# Patient Record
Sex: Male | Born: 1975
Health system: Southern US, Community
[De-identification: ages and names within clinical notes are randomized; demographics above are authoritative.]

## PROBLEM LIST (undated history)

## (undated) DIAGNOSIS — E669 Obesity, unspecified: Secondary | ICD-10-CM

## (undated) DIAGNOSIS — N179 Acute kidney failure, unspecified: Secondary | ICD-10-CM

## (undated) DIAGNOSIS — F419 Anxiety disorder, unspecified: Secondary | ICD-10-CM

## (undated) DIAGNOSIS — E119 Type 2 diabetes mellitus without complications: Secondary | ICD-10-CM

## (undated) DIAGNOSIS — I10 Essential (primary) hypertension: Secondary | ICD-10-CM

## (undated) DIAGNOSIS — K859 Acute pancreatitis without necrosis or infection, unspecified: Secondary | ICD-10-CM

## (undated) DIAGNOSIS — I82890 Acute embolism and thrombosis of other specified veins: Secondary | ICD-10-CM

## (undated) HISTORY — PX: BACK SURGERY: SHX140

## (undated) HISTORY — DX: Obesity, unspecified: E66.9

## (undated) HISTORY — DX: Essential (primary) hypertension: I10

## (undated) HISTORY — DX: Acute embolism and thrombosis of other specified veins: I82.890

## (undated) HISTORY — DX: Type 2 diabetes mellitus without complications: E11.9

---

## 2015-11-02 DIAGNOSIS — B079 Viral wart, unspecified: Secondary | ICD-10-CM | POA: Diagnosis not present

## 2015-11-03 DIAGNOSIS — F411 Generalized anxiety disorder: Secondary | ICD-10-CM | POA: Diagnosis not present

## 2015-11-10 DIAGNOSIS — F411 Generalized anxiety disorder: Secondary | ICD-10-CM | POA: Diagnosis not present

## 2015-11-16 DIAGNOSIS — B079 Viral wart, unspecified: Secondary | ICD-10-CM | POA: Diagnosis not present

## 2015-11-18 DIAGNOSIS — I10 Essential (primary) hypertension: Secondary | ICD-10-CM | POA: Diagnosis not present

## 2015-11-23 DIAGNOSIS — F411 Generalized anxiety disorder: Secondary | ICD-10-CM | POA: Diagnosis not present

## 2015-12-01 DIAGNOSIS — F411 Generalized anxiety disorder: Secondary | ICD-10-CM | POA: Diagnosis not present

## 2015-12-07 DIAGNOSIS — B070 Plantar wart: Secondary | ICD-10-CM | POA: Diagnosis not present

## 2015-12-15 DIAGNOSIS — F411 Generalized anxiety disorder: Secondary | ICD-10-CM | POA: Diagnosis not present

## 2015-12-21 ENCOUNTER — Emergency Department (HOSPITAL_COMMUNITY): Payer: Federal, State, Local not specified - PPO

## 2015-12-21 ENCOUNTER — Encounter (HOSPITAL_COMMUNITY): Payer: Self-pay | Admitting: *Deleted

## 2015-12-21 ENCOUNTER — Inpatient Hospital Stay (HOSPITAL_COMMUNITY)
Admission: EM | Admit: 2015-12-21 | Discharge: 2016-01-05 | DRG: 871 | Disposition: A | Discharge status: Home Health | Payer: Federal, State, Local not specified - PPO | Attending: Internal Medicine | Admitting: Internal Medicine

## 2015-12-21 DIAGNOSIS — K859 Acute pancreatitis without necrosis or infection, unspecified: Secondary | ICD-10-CM | POA: Insufficient documentation

## 2015-12-21 DIAGNOSIS — R945 Abnormal results of liver function studies: Secondary | ICD-10-CM | POA: Diagnosis not present

## 2015-12-21 DIAGNOSIS — F419 Anxiety disorder, unspecified: Secondary | ICD-10-CM | POA: Diagnosis present

## 2015-12-21 DIAGNOSIS — E872 Acidosis, unspecified: Secondary | ICD-10-CM

## 2015-12-21 DIAGNOSIS — F411 Generalized anxiety disorder: Secondary | ICD-10-CM | POA: Diagnosis not present

## 2015-12-21 DIAGNOSIS — K8591 Acute pancreatitis with uninfected necrosis, unspecified: Secondary | ICD-10-CM | POA: Diagnosis not present

## 2015-12-21 DIAGNOSIS — K8080 Other cholelithiasis without obstruction: Secondary | ICD-10-CM | POA: Diagnosis not present

## 2015-12-21 DIAGNOSIS — R6521 Severe sepsis with septic shock: Secondary | ICD-10-CM | POA: Diagnosis present

## 2015-12-21 DIAGNOSIS — G9340 Encephalopathy, unspecified: Secondary | ICD-10-CM | POA: Diagnosis not present

## 2015-12-21 DIAGNOSIS — K8020 Calculus of gallbladder without cholecystitis without obstruction: Secondary | ICD-10-CM | POA: Diagnosis not present

## 2015-12-21 DIAGNOSIS — Z452 Encounter for adjustment and management of vascular access device: Secondary | ICD-10-CM

## 2015-12-21 DIAGNOSIS — Z9911 Dependence on respirator [ventilator] status: Secondary | ICD-10-CM | POA: Diagnosis not present

## 2015-12-21 DIAGNOSIS — I10 Essential (primary) hypertension: Secondary | ICD-10-CM | POA: Diagnosis not present

## 2015-12-21 DIAGNOSIS — D72829 Elevated white blood cell count, unspecified: Secondary | ICD-10-CM

## 2015-12-21 DIAGNOSIS — R1011 Right upper quadrant pain: Secondary | ICD-10-CM

## 2015-12-21 DIAGNOSIS — K760 Fatty (change of) liver, not elsewhere classified: Secondary | ICD-10-CM | POA: Diagnosis present

## 2015-12-21 DIAGNOSIS — R140 Abdominal distension (gaseous): Secondary | ICD-10-CM

## 2015-12-21 DIAGNOSIS — A419 Sepsis, unspecified organism: Secondary | ICD-10-CM | POA: Diagnosis not present

## 2015-12-21 DIAGNOSIS — Z79899 Other long term (current) drug therapy: Secondary | ICD-10-CM | POA: Diagnosis not present

## 2015-12-21 DIAGNOSIS — R1084 Generalized abdominal pain: Secondary | ICD-10-CM | POA: Diagnosis not present

## 2015-12-21 DIAGNOSIS — N179 Acute kidney failure, unspecified: Secondary | ICD-10-CM | POA: Diagnosis not present

## 2015-12-21 DIAGNOSIS — K851 Biliary acute pancreatitis without necrosis or infection: Secondary | ICD-10-CM | POA: Diagnosis present

## 2015-12-21 DIAGNOSIS — E1165 Type 2 diabetes mellitus with hyperglycemia: Secondary | ICD-10-CM | POA: Diagnosis not present

## 2015-12-21 DIAGNOSIS — R339 Retention of urine, unspecified: Secondary | ICD-10-CM | POA: Diagnosis not present

## 2015-12-21 DIAGNOSIS — K567 Ileus, unspecified: Secondary | ICD-10-CM | POA: Diagnosis not present

## 2015-12-21 DIAGNOSIS — K828 Other specified diseases of gallbladder: Secondary | ICD-10-CM | POA: Diagnosis present

## 2015-12-21 DIAGNOSIS — E876 Hypokalemia: Secondary | ICD-10-CM | POA: Diagnosis not present

## 2015-12-21 DIAGNOSIS — J9600 Acute respiratory failure, unspecified whether with hypoxia or hypercapnia: Secondary | ICD-10-CM | POA: Diagnosis not present

## 2015-12-21 DIAGNOSIS — J9690 Respiratory failure, unspecified, unspecified whether with hypoxia or hypercapnia: Secondary | ICD-10-CM | POA: Diagnosis not present

## 2015-12-21 DIAGNOSIS — R0600 Dyspnea, unspecified: Secondary | ICD-10-CM

## 2015-12-21 DIAGNOSIS — R509 Fever, unspecified: Secondary | ICD-10-CM

## 2015-12-21 DIAGNOSIS — D6489 Other specified anemias: Secondary | ICD-10-CM | POA: Diagnosis not present

## 2015-12-21 DIAGNOSIS — E870 Hyperosmolality and hypernatremia: Secondary | ICD-10-CM | POA: Diagnosis present

## 2015-12-21 DIAGNOSIS — R079 Chest pain, unspecified: Secondary | ICD-10-CM | POA: Diagnosis not present

## 2015-12-21 DIAGNOSIS — I160 Hypertensive urgency: Secondary | ICD-10-CM | POA: Diagnosis not present

## 2015-12-21 DIAGNOSIS — R10816 Epigastric abdominal tenderness: Secondary | ICD-10-CM | POA: Diagnosis not present

## 2015-12-21 DIAGNOSIS — E46 Unspecified protein-calorie malnutrition: Secondary | ICD-10-CM | POA: Diagnosis not present

## 2015-12-21 DIAGNOSIS — Z6841 Body Mass Index (BMI) 40.0 and over, adult: Secondary | ICD-10-CM

## 2015-12-21 DIAGNOSIS — T07 Unspecified multiple injuries: Secondary | ICD-10-CM | POA: Diagnosis not present

## 2015-12-21 DIAGNOSIS — K8689 Other specified diseases of pancreas: Secondary | ICD-10-CM | POA: Diagnosis present

## 2015-12-21 DIAGNOSIS — J90 Pleural effusion, not elsewhere classified: Secondary | ICD-10-CM | POA: Diagnosis not present

## 2015-12-21 DIAGNOSIS — R0602 Shortness of breath: Secondary | ICD-10-CM | POA: Diagnosis not present

## 2015-12-21 DIAGNOSIS — R739 Hyperglycemia, unspecified: Secondary | ICD-10-CM | POA: Diagnosis not present

## 2015-12-21 DIAGNOSIS — E669 Obesity, unspecified: Secondary | ICD-10-CM

## 2015-12-21 DIAGNOSIS — J811 Chronic pulmonary edema: Secondary | ICD-10-CM | POA: Diagnosis not present

## 2015-12-21 DIAGNOSIS — J9601 Acute respiratory failure with hypoxia: Secondary | ICD-10-CM | POA: Insufficient documentation

## 2015-12-21 DIAGNOSIS — Z4682 Encounter for fitting and adjustment of non-vascular catheter: Secondary | ICD-10-CM | POA: Diagnosis not present

## 2015-12-21 DIAGNOSIS — R0789 Other chest pain: Secondary | ICD-10-CM | POA: Diagnosis not present

## 2015-12-21 DIAGNOSIS — R6510 Systemic inflammatory response syndrome (SIRS) of non-infectious origin without acute organ dysfunction: Secondary | ICD-10-CM | POA: Diagnosis not present

## 2015-12-21 DIAGNOSIS — R410 Disorientation, unspecified: Secondary | ICD-10-CM | POA: Diagnosis not present

## 2015-12-21 DIAGNOSIS — Z4659 Encounter for fitting and adjustment of other gastrointestinal appliance and device: Secondary | ICD-10-CM

## 2015-12-21 HISTORY — DX: Anxiety disorder, unspecified: F41.9

## 2015-12-21 HISTORY — DX: Acute pancreatitis without necrosis or infection, unspecified: K85.90

## 2015-12-21 HISTORY — DX: Acute kidney failure, unspecified: N17.9

## 2015-12-21 LAB — COMPREHENSIVE METABOLIC PANEL
ALT: 227 U/L — ABNORMAL HIGH (ref 17–63)
AST: 218 U/L — ABNORMAL HIGH (ref 15–41)
Albumin: 4.3 g/dL (ref 3.5–5.0)
Alkaline Phosphatase: 58 U/L (ref 38–126)
Anion gap: 14 (ref 5–15)
BUN: 18 mg/dL (ref 6–20)
CO2: 20 mmol/L — ABNORMAL LOW (ref 22–32)
Calcium: 5.4 mg/dL — CL (ref 8.9–10.3)
Chloride: 107 mmol/L (ref 101–111)
Creatinine, Ser: 1.6 mg/dL — ABNORMAL HIGH (ref 0.61–1.24)
GFR calc Af Amer: >60 mL/min (ref >60)
GFR calc non Af Amer: 52 mL/min — ABNORMAL LOW (ref >60)
Glucose, Bld: 238 mg/dL — ABNORMAL HIGH (ref 65–99)
Potassium: 3 mmol/L — ABNORMAL LOW (ref 3.5–5.1)
Sodium: 141 mmol/L (ref 135–145)
Total Bilirubin: 1.7 mg/dL — ABNORMAL HIGH (ref 0.3–1.2)
Total Protein: 6.5 g/dL (ref 6.5–8.1)

## 2015-12-21 LAB — I-STAT CHEM 8, ED
BUN: 21 mg/dL — ABNORMAL HIGH (ref 6–20)
Calcium, Ion: 1.05 mmol/L — ABNORMAL LOW (ref 1.12–1.23)
Chloride: 105 mmol/L (ref 101–111)
Creatinine, Ser: 1.4 mg/dL — ABNORMAL HIGH (ref 0.61–1.24)
Glucose, Bld: 235 mg/dL — ABNORMAL HIGH (ref 65–99)
HCT: 52 % (ref 39.0–52.0)
Hemoglobin: 17.7 g/dL — ABNORMAL HIGH (ref 13.0–17.0)
Potassium: 3 mmol/L — ABNORMAL LOW (ref 3.5–5.1)
Sodium: 144 mmol/L (ref 135–145)
TCO2: 22 mmol/L (ref 0–100)

## 2015-12-21 LAB — CBC WITH DIFFERENTIAL/PLATELET
Basophils Absolute: 0 10*3/uL (ref 0.0–0.1)
Basophils Relative: 0 %
Eosinophils Absolute: 0 10*3/uL (ref 0.0–0.7)
Eosinophils Relative: 0 %
HCT: 47.7 % (ref 39.0–52.0)
Hemoglobin: 17.1 g/dL — ABNORMAL HIGH (ref 13.0–17.0)
Lymphocytes Relative: 12 %
Lymphs Abs: 2.6 10*3/uL (ref 0.7–4.0)
MCH: 30.9 pg (ref 26.0–34.0)
MCHC: 35.8 g/dL (ref 30.0–36.0)
MCV: 86.1 fL (ref 78.0–100.0)
Monocytes Absolute: 1.7 10*3/uL — ABNORMAL HIGH (ref 0.1–1.0)
Monocytes Relative: 8 %
Neutro Abs: 17.6 10*3/uL — ABNORMAL HIGH (ref 1.7–7.7)
Neutrophils Relative %: 80 %
Platelets: 354 10*3/uL (ref 150–400)
RBC: 5.54 MIL/uL (ref 4.22–5.81)
RDW: 12.1 % (ref 11.5–15.5)
WBC: 21.9 10*3/uL — ABNORMAL HIGH (ref 4.0–10.5)

## 2015-12-21 LAB — PROTIME-INR
INR: 1.14 (ref 0.00–1.49)
Prothrombin Time: 14.7 seconds (ref 11.6–15.2)

## 2015-12-21 LAB — TROPONIN I: Troponin I: <0.03 ng/mL (ref <0.031)

## 2015-12-21 LAB — ABO/RH: ABO/RH(D): AB POS

## 2015-12-21 LAB — TYPE AND SCREEN
ABO/RH(D): AB POS
Antibody Screen: NEGATIVE

## 2015-12-21 MED ORDER — GI COCKTAIL ~~LOC~~: 30.0000 mL | Freq: Once | ORAL | Status: DC

## 2015-12-21 MED ORDER — HYDROMORPHONE HCL 1 MG/ML IJ SOLN
1.0000 mg | Freq: Once | INTRAMUSCULAR | Status: AC
  Administered 2015-12-21: 1 mg via INTRAVENOUS
  Filled 2015-12-21: qty 1

## 2015-12-21 MED ORDER — SODIUM CHLORIDE 0.9 % IV SOLN
1.0000 g | Freq: Once | INTRAVENOUS | Status: AC
  Administered 2015-12-22: 1 g via INTRAVENOUS
  Filled 2015-12-21: qty 10

## 2015-12-21 MED ORDER — IOPAMIDOL (ISOVUE-370) INJECTION 76%
INTRAVENOUS | Status: AC
  Administered 2015-12-21: 100 mL
  Filled 2015-12-21: qty 100

## 2015-12-21 MED ORDER — LABETALOL HCL 5 MG/ML IV SOLN
10.0000 mg | Freq: Once | INTRAVENOUS | Status: AC
  Administered 2015-12-21: 10 mg via INTRAVENOUS
  Filled 2015-12-21: qty 4

## 2015-12-21 NOTE — ED Notes (Signed)
Lab tech to add on lipase

## 2015-12-21 NOTE — ED Provider Notes (Signed)
CSN: [id number]     Arrival date & time 12/21/15  2201 History   First MD Initiated Contact with Patient 12/21/15 2203     Chief Complaint  Patient presents with  . Chest Pain  . Abdominal Pain     (Consider location/radiation/quality/duration/timing/severity/associated sxs/prior Treatment) HPI Comments: Patient with a history of HTN (started lisinopril 3 weeks ago), back pain presents with onset dull, pressure like pain the lower mid-chest around 8:30 pm, that subsequently moved into the abdomen. The pain radiates to the back. He had one episode vomiting at the onset of symptoms and none since. No nausea. He denies SOB but reports the pain is worse with taking a breath. He called his doctor and took Tums x 4 as recommended. When the pain worsened and he started profusely sweating he called for EMS. Per EMS reports, he received 4 NTG and 4 mg Morphine in route and the patient reports no change in symptoms with these medications. No recent fever, cough. No previous history of similar symptoms.   Patient is a 40 y.o. male presenting with chest pain and abdominal pain. The history is provided by the patient and the EMS personnel. No language interpreter was used.  Chest Pain Pain location:  Substernal area Pain quality: dull and pressure   Pain radiates to:  Upper back Pain radiates to the back: yes   Pain severity:  Severe Onset quality:  Gradual Duration:  3 hours Timing:  Constant Progression:  Worsening Associated symptoms: abdominal pain, diaphoresis and vomiting   Associated symptoms: no dizziness, no fever, no headache, no nausea and no shortness of breath   Abdominal Pain Associated symptoms: chest pain and vomiting   Associated symptoms: no chills, no fever, no nausea and no shortness of breath     Past Medical History  Diagnosis Date  . Hypertension    Past Surgical History  Procedure Laterality Date  . Back surgery     No family history on file. Social History   Substance Use Topics  . Smoking status: Never Smoker   . Smokeless tobacco: None  . Alcohol Use: Yes    Review of Systems  Constitutional: Positive for diaphoresis. Negative for fever and chills.  HENT: Negative.   Respiratory: Negative.  Negative for shortness of breath.        See HPI.  Cardiovascular: Positive for chest pain.  Gastrointestinal: Positive for vomiting and abdominal pain. Negative for nausea.  Genitourinary: Negative.   Musculoskeletal: Negative.   Skin: Negative.   Neurological: Negative.  Negative for dizziness, syncope and headaches.      Allergies  Review of patient's allergies indicates no known allergies.  Home Medications   Prior to Admission medications   Not on File   BP 169/99 mmHg Physical Exam  Constitutional: He is oriented to person, place, and time. He appears well-developed and well-nourished.  Diaphoretic, uncomfortable appearing.  HENT:  Head: Normocephalic.  Neck: Normal range of motion. Neck supple.  Cardiovascular: Normal rate, regular rhythm and intact distal pulses.   Pulses are bilaterally equal in UE and LE's.  Pulmonary/Chest: Effort normal and breath sounds normal. He has no wheezes. He has no rales. He exhibits no tenderness.  Abdominal: Soft. Bowel sounds are normal. He exhibits no distension. There is tenderness. There is no rebound (Diffusely tender abdomen, worse in upper quadrants.) and no guarding.  Musculoskeletal: Normal range of motion. He exhibits no edema.  Neurological: He is alert and oriented to person, place, and time.  Skin:  Skin is warm and dry. No rash noted. No pallor.  Psychiatric: He has a normal mood and affect.  Nursing note and vitals reviewed.   ED Course  Procedures (including critical care time) Labs Review Labs Reviewed  TROPONIN I  COMPREHENSIVE METABOLIC PANEL  CBC WITH DIFFERENTIAL/PLATELET  I-STAT CHEM 8, ED   Results for orders placed or performed during the hospital encounter of  12/21/15  Troponin I  Result Value Ref Range   Troponin I <0.03 <0.031 ng/mL  Comprehensive metabolic panel  Result Value Ref Range   Sodium 141 135 - 145 mmol/L   Potassium 3.0 (L) 3.5 - 5.1 mmol/L   Chloride 107 101 - 111 mmol/L   CO2 20 (L) 22 - 32 mmol/L   Glucose, Bld 238 (H) 65 - 99 mg/dL   BUN 18 6 - 20 mg/dL   Creatinine, Ser 6.04 (H) 0.61 - 1.24 mg/dL   Calcium 5.4 (LL) 8.9 - 10.3 mg/dL   Total Protein 6.5 6.5 - 8.1 g/dL   Albumin 4.3 3.5 - 5.0 g/dL   AST 540 (H) 15 - 41 U/L   ALT 227 (H) 17 - 63 U/L   Alkaline Phosphatase 58 38 - 126 U/L   Total Bilirubin 1.7 (H) 0.3 - 1.2 mg/dL   GFR calc non Af Amer 52 (L) >60 mL/min   GFR calc Af Amer >60 >60 mL/min   Anion gap 14 5 - 15  CBC with Differential  Result Value Ref Range   WBC 21.9 (H) 4.0 - 10.5 K/uL   RBC 5.54 4.22 - 5.81 MIL/uL   Hemoglobin 17.1 (H) 13.0 - 17.0 g/dL   HCT 98.1 19.1 - 47.8 %   MCV 86.1 78.0 - 100.0 fL   MCH 30.9 26.0 - 34.0 pg   MCHC 35.8 30.0 - 36.0 g/dL   RDW 29.5 62.1 - 30.8 %   Platelets 354 150 - 400 K/uL   Neutrophils Relative % 80 %   Neutro Abs 17.6 (H) 1.7 - 7.7 K/uL   Lymphocytes Relative 12 %   Lymphs Abs 2.6 0.7 - 4.0 K/uL   Monocytes Relative 8 %   Monocytes Absolute 1.7 (H) 0.1 - 1.0 K/uL   Eosinophils Relative 0 %   Eosinophils Absolute 0.0 0.0 - 0.7 K/uL   Basophils Relative 0 %   Basophils Absolute 0.0 0.0 - 0.1 K/uL  Protime-INR  Result Value Ref Range   Prothrombin Time 14.7 11.6 - 15.2 seconds   INR 1.14 0.00 - 1.49  Lipase, blood  Result Value Ref Range   Lipase 3720 (H) 11 - 51 U/L  I-stat Chem 8, ED  Result Value Ref Range   Sodium 144 135 - 145 mmol/L   Potassium 3.0 (L) 3.5 - 5.1 mmol/L   Chloride 105 101 - 111 mmol/L   BUN 21 (H) 6 - 20 mg/dL   Creatinine, Ser 6.57 (H) 0.61 - 1.24 mg/dL   Glucose, Bld 846 (H) 65 - 99 mg/dL   Calcium, Ion 9.62 (L) 1.12 - 1.23 mmol/L   TCO2 22 0 - 100 mmol/L   Hemoglobin 17.7 (H) 13.0 - 17.0 g/dL   HCT 95.2 84.1 - 32.4 %   Type and screen [facility]  Result Value Ref Range   ABO/RH(D) AB POS    Antibody Screen NEG    Sample Expiration 12/24/2015   ABO/Rh  Result Value Ref Range   ABO/RH(D) AB POS     Imaging Review No results found.  I have personally reviewed and evaluated these images and lab results as part of my medical decision-making.   EKG Interpretation   Date/Time:  Monday Dec 21 2015 22:02:18 EDT Ventricular Rate:  95 PR Interval:  154 QRS Duration: 112 QT Interval:  388 QTC Calculation: 488 R Axis:   84 Text Interpretation:  Sinus rhythm Borderline intraventricular conduction  delay Borderline repolarization abnormality Borderline prolonged QT  interval No previous ECGs available Confirmed by YAO  MD, DAVID ([id number]) on  12/21/2015 10:16:17 PM     CRITICAL CARE Performed by: Elpidio AnisUPSTILL, Vasil Juhasz A   Total critical care time: 25 minutes  Critical care time was exclusive of separately billable procedures and treating other patients.  Critical care was necessary to treat or prevent imminent or life-threatening deterioration.  Critical care was time spent personally by me on the following activities: development of treatment plan with patient and/or surrogate as well as nursing, discussions with consultants, evaluation of patient's response to treatment, examination of patient, obtaining history from patient or surrogate, ordering and performing treatments and interventions, ordering and review of laboratory studies, ordering and review of radiographic studies, pulse oximetry and re-evaluation of patient's condition.  MDM   Final diagnoses:  None   1. Pancreatitis 2. Hypertensive urgency 3. Hypocalcemia    Patient's EKG on arrival shows no ischemia. Dissection considered - CTA per protocol order. Dr. Silverio LayYao in the room to evaluate the patient. VSS. BP in right arm 149/101, left arm 169/99.  11:50 - No dissection seen on CT per Dr. Silverio LayYao - waiting on formal read. Lipase  significantly elevated at 3720, critical hypocalcemia (5.4), moderate leukocytosis (21.9), elevated liver transaminases (ALT 227, AST 218) with total bilirubin 1.7. Discussed the patient with Dr. Silverio LayYao. Ranson's criteria incomplete (unknown LDL) but there is severity concern given the drop in calcium. Plan: waiting for CT results for +/- gall stones to decide on utility of US.  Patient and wife updated on results so far. Blood pressure 218/128. Additional Labetalol ordered as well as pain medication.  1:00 - re-evaluation - abdomen remains tender without worsening tenderness or peritoneal signs. Blood pressure continues to rise. Labetalol, then hydralazine ordered. Will continue to monitor.   1:30 - the CT shows no dissection; pancreatitis confirmed. No gall stones visualized. US pending. Will admit to hospitalist for treatment of pancreatitis.   Elpidio AnisShari Laelynn Blizzard, PA-C 12/22/15 0141  Richardean Canalavid H Yao, MD 12/23/15 [phone]

## 2015-12-21 NOTE — ED Notes (Signed)
Patient transported to CT 

## 2015-12-21 NOTE — ED Notes (Signed)
Pt to ED by EMS c/o chest pressure/epigastric pain onset at 1600. At 2100, pt became diaphoretic with NV. Pt contacted PCP office, told to call EMS. Pt diaphoretic on arrival, Pt reports "hard to breath due to the pain." Pt received nitro x 4, 324 mg asa, 4mg  morphine, and total of tums x 8.

## 2015-12-22 ENCOUNTER — Emergency Department (HOSPITAL_COMMUNITY): Payer: Federal, State, Local not specified - PPO

## 2015-12-22 ENCOUNTER — Encounter (HOSPITAL_COMMUNITY): Payer: Self-pay | Admitting: General Surgery

## 2015-12-22 DIAGNOSIS — Z6841 Body Mass Index (BMI) 40.0 and over, adult: Secondary | ICD-10-CM | POA: Diagnosis not present

## 2015-12-22 DIAGNOSIS — R410 Disorientation, unspecified: Secondary | ICD-10-CM | POA: Diagnosis not present

## 2015-12-22 DIAGNOSIS — I10 Essential (primary) hypertension: Secondary | ICD-10-CM | POA: Diagnosis not present

## 2015-12-22 DIAGNOSIS — J90 Pleural effusion, not elsewhere classified: Secondary | ICD-10-CM | POA: Diagnosis not present

## 2015-12-22 DIAGNOSIS — K851 Biliary acute pancreatitis without necrosis or infection: Secondary | ICD-10-CM | POA: Diagnosis not present

## 2015-12-22 DIAGNOSIS — D72829 Elevated white blood cell count, unspecified: Secondary | ICD-10-CM

## 2015-12-22 DIAGNOSIS — Z452 Encounter for adjustment and management of vascular access device: Secondary | ICD-10-CM | POA: Diagnosis not present

## 2015-12-22 DIAGNOSIS — E872 Acidosis, unspecified: Secondary | ICD-10-CM

## 2015-12-22 DIAGNOSIS — A419 Sepsis, unspecified organism: Secondary | ICD-10-CM | POA: Diagnosis not present

## 2015-12-22 DIAGNOSIS — N179 Acute kidney failure, unspecified: Secondary | ICD-10-CM | POA: Diagnosis not present

## 2015-12-22 DIAGNOSIS — R739 Hyperglycemia, unspecified: Secondary | ICD-10-CM

## 2015-12-22 DIAGNOSIS — F411 Generalized anxiety disorder: Secondary | ICD-10-CM | POA: Diagnosis not present

## 2015-12-22 DIAGNOSIS — J9601 Acute respiratory failure with hypoxia: Secondary | ICD-10-CM | POA: Diagnosis not present

## 2015-12-22 DIAGNOSIS — J811 Chronic pulmonary edema: Secondary | ICD-10-CM | POA: Diagnosis not present

## 2015-12-22 DIAGNOSIS — J9600 Acute respiratory failure, unspecified whether with hypoxia or hypercapnia: Secondary | ICD-10-CM | POA: Diagnosis not present

## 2015-12-22 DIAGNOSIS — R0602 Shortness of breath: Secondary | ICD-10-CM | POA: Diagnosis not present

## 2015-12-22 DIAGNOSIS — I160 Hypertensive urgency: Secondary | ICD-10-CM | POA: Diagnosis present

## 2015-12-22 DIAGNOSIS — K567 Ileus, unspecified: Secondary | ICD-10-CM | POA: Diagnosis not present

## 2015-12-22 DIAGNOSIS — Z4682 Encounter for fitting and adjustment of non-vascular catheter: Secondary | ICD-10-CM | POA: Diagnosis not present

## 2015-12-22 DIAGNOSIS — G9340 Encephalopathy, unspecified: Secondary | ICD-10-CM | POA: Diagnosis not present

## 2015-12-22 DIAGNOSIS — E876 Hypokalemia: Secondary | ICD-10-CM

## 2015-12-22 DIAGNOSIS — E870 Hyperosmolality and hypernatremia: Secondary | ICD-10-CM | POA: Diagnosis not present

## 2015-12-22 DIAGNOSIS — K859 Acute pancreatitis without necrosis or infection, unspecified: Secondary | ICD-10-CM | POA: Diagnosis not present

## 2015-12-22 DIAGNOSIS — Z79899 Other long term (current) drug therapy: Secondary | ICD-10-CM | POA: Diagnosis not present

## 2015-12-22 DIAGNOSIS — K760 Fatty (change of) liver, not elsewhere classified: Secondary | ICD-10-CM | POA: Diagnosis present

## 2015-12-22 DIAGNOSIS — K828 Other specified diseases of gallbladder: Secondary | ICD-10-CM | POA: Diagnosis present

## 2015-12-22 DIAGNOSIS — J9690 Respiratory failure, unspecified, unspecified whether with hypoxia or hypercapnia: Secondary | ICD-10-CM | POA: Diagnosis not present

## 2015-12-22 DIAGNOSIS — R10816 Epigastric abdominal tenderness: Secondary | ICD-10-CM | POA: Diagnosis not present

## 2015-12-22 DIAGNOSIS — R6521 Severe sepsis with septic shock: Secondary | ICD-10-CM | POA: Diagnosis not present

## 2015-12-22 DIAGNOSIS — E46 Unspecified protein-calorie malnutrition: Secondary | ICD-10-CM | POA: Diagnosis not present

## 2015-12-22 DIAGNOSIS — F419 Anxiety disorder, unspecified: Secondary | ICD-10-CM | POA: Diagnosis present

## 2015-12-22 DIAGNOSIS — K8080 Other cholelithiasis without obstruction: Secondary | ICD-10-CM | POA: Diagnosis not present

## 2015-12-22 DIAGNOSIS — D6489 Other specified anemias: Secondary | ICD-10-CM | POA: Diagnosis not present

## 2015-12-22 DIAGNOSIS — R945 Abnormal results of liver function studies: Secondary | ICD-10-CM | POA: Diagnosis not present

## 2015-12-22 DIAGNOSIS — R339 Retention of urine, unspecified: Secondary | ICD-10-CM | POA: Diagnosis not present

## 2015-12-22 DIAGNOSIS — R6510 Systemic inflammatory response syndrome (SIRS) of non-infectious origin without acute organ dysfunction: Secondary | ICD-10-CM | POA: Diagnosis not present

## 2015-12-22 DIAGNOSIS — Z9911 Dependence on respirator [ventilator] status: Secondary | ICD-10-CM | POA: Diagnosis not present

## 2015-12-22 DIAGNOSIS — E669 Obesity, unspecified: Secondary | ICD-10-CM

## 2015-12-22 DIAGNOSIS — E1165 Type 2 diabetes mellitus with hyperglycemia: Secondary | ICD-10-CM | POA: Diagnosis present

## 2015-12-22 DIAGNOSIS — K8689 Other specified diseases of pancreas: Secondary | ICD-10-CM | POA: Diagnosis present

## 2015-12-22 DIAGNOSIS — K8591 Acute pancreatitis with uninfected necrosis, unspecified: Secondary | ICD-10-CM | POA: Diagnosis not present

## 2015-12-22 DIAGNOSIS — R1084 Generalized abdominal pain: Secondary | ICD-10-CM | POA: Diagnosis not present

## 2015-12-22 DIAGNOSIS — K8020 Calculus of gallbladder without cholecystitis without obstruction: Secondary | ICD-10-CM | POA: Diagnosis not present

## 2015-12-22 HISTORY — DX: Acute kidney failure, unspecified: N17.9

## 2015-12-22 HISTORY — DX: Acute pancreatitis without necrosis or infection, unspecified: K85.90

## 2015-12-22 LAB — BASIC METABOLIC PANEL
Anion gap: 11 (ref 5–15)
BUN: 38 mg/dL — ABNORMAL HIGH (ref 6–20)
CO2: 16 mmol/L — ABNORMAL LOW (ref 22–32)
Calcium: 7.7 mg/dL — ABNORMAL LOW (ref 8.9–10.3)
Chloride: 110 mmol/L (ref 101–111)
Creatinine, Ser: 2.26 mg/dL — ABNORMAL HIGH (ref 0.61–1.24)
GFR calc Af Amer: 40 mL/min — ABNORMAL LOW (ref >60)
GFR calc non Af Amer: 34 mL/min — ABNORMAL LOW (ref >60)
Glucose, Bld: 550 mg/dL — ABNORMAL HIGH (ref 65–99)
Potassium: 5.6 mmol/L — ABNORMAL HIGH (ref 3.5–5.1)
Sodium: 137 mmol/L (ref 135–145)

## 2015-12-22 LAB — CBC
HCT: 49.2 % (ref 39.0–52.0)
Hemoglobin: 17.2 g/dL — ABNORMAL HIGH (ref 13.0–17.0)
MCH: 30 pg (ref 26.0–34.0)
MCHC: 35 g/dL (ref 30.0–36.0)
MCV: 85.9 fL (ref 78.0–100.0)
Platelets: 334 10*3/uL (ref 150–400)
RBC: 5.73 MIL/uL (ref 4.22–5.81)
RDW: 12.5 % (ref 11.5–15.5)
WBC: 14.9 10*3/uL — ABNORMAL HIGH (ref 4.0–10.5)

## 2015-12-22 LAB — LACTIC ACID, PLASMA
Lactic Acid, Venous: 3.4 mmol/L (ref 0.5–2.0)
Lactic Acid, Venous: 3.5 mmol/L (ref 0.5–2.0)

## 2015-12-22 LAB — COMPREHENSIVE METABOLIC PANEL
ALT: 322 U/L — ABNORMAL HIGH (ref 17–63)
AST: 183 U/L — ABNORMAL HIGH (ref 15–41)
Albumin: 4 g/dL (ref 3.5–5.0)
Alkaline Phosphatase: 60 U/L (ref 38–126)
Anion gap: 13 (ref 5–15)
BUN: 16 mg/dL (ref 6–20)
CO2: 19 mmol/L — ABNORMAL LOW (ref 22–32)
Calcium: 8.6 mg/dL — ABNORMAL LOW (ref 8.9–10.3)
Chloride: 108 mmol/L (ref 101–111)
Creatinine, Ser: 1.19 mg/dL (ref 0.61–1.24)
GFR calc Af Amer: >60 mL/min (ref >60)
GFR calc non Af Amer: >60 mL/min (ref >60)
Glucose, Bld: 293 mg/dL — ABNORMAL HIGH (ref 65–99)
Potassium: 4.7 mmol/L (ref 3.5–5.1)
Sodium: 140 mmol/L (ref 135–145)
Total Bilirubin: 1.3 mg/dL — ABNORMAL HIGH (ref 0.3–1.2)
Total Protein: 6.8 g/dL (ref 6.5–8.1)

## 2015-12-22 LAB — I-STAT CG4 LACTIC ACID, ED
Lactic Acid, Venous: 2.64 mmol/L (ref 0.5–2.0)
Lactic Acid, Venous: 3.06 mmol/L (ref 0.5–2.0)
Lactic Acid, Venous: 4.55 mmol/L (ref 0.5–2.0)

## 2015-12-22 LAB — GLUCOSE, CAPILLARY
Glucose-Capillary: 532 mg/dL — ABNORMAL HIGH (ref 65–99)
Glucose-Capillary: 562 mg/dL (ref 65–99)

## 2015-12-22 LAB — LIPASE, BLOOD
Lipase: >3000 U/L — ABNORMAL HIGH (ref 11–51)
Lipase: 891 U/L — ABNORMAL HIGH (ref 11–51)

## 2015-12-22 LAB — LIPID PANEL
Cholesterol: 187 mg/dL (ref 0–200)
HDL: 41 mg/dL (ref >40)
LDL Cholesterol: 133 mg/dL — ABNORMAL HIGH (ref 0–99)
Total CHOL/HDL Ratio: 4.6 RATIO
Triglycerides: 63 mg/dL (ref <150)
VLDL: 13 mg/dL (ref 0–40)

## 2015-12-22 LAB — MRSA PCR SCREENING: MRSA by PCR: NEGATIVE

## 2015-12-22 MED ORDER — SODIUM CHLORIDE 0.9 % IV BOLUS (SEPSIS)
1000.0000 mL | Freq: Once | INTRAVENOUS | Status: AC
  Administered 2015-12-22: 1000 mL via INTRAVENOUS

## 2015-12-22 MED ORDER — SODIUM CHLORIDE 0.9 % IV SOLN
INTRAVENOUS | Status: DC
  Administered 2015-12-22: 5 [IU]/h via INTRAVENOUS
  Administered 2015-12-24: 2 [IU]/h via INTRAVENOUS
  Filled 2015-12-22 (×3): qty 2.5

## 2015-12-22 MED ORDER — SODIUM CHLORIDE 0.9 % IV SOLN
INTRAVENOUS | Status: DC
  Administered 2015-12-22 (×2): via INTRAVENOUS

## 2015-12-22 MED ORDER — DEXTROSE-NACL 5-0.45 % IV SOLN
INTRAVENOUS | Status: DC
Start: 2015-12-22 — End: 2015-12-23

## 2015-12-22 MED ORDER — LABETALOL HCL 5 MG/ML IV SOLN
10.0000 mg | Freq: Once | INTRAVENOUS | Status: AC
  Administered 2015-12-22: 10 mg via INTRAVENOUS
  Filled 2015-12-22: qty 4

## 2015-12-22 MED ORDER — HYDRALAZINE HCL 20 MG/ML IJ SOLN
20.0000 mg | INTRAMUSCULAR | Status: DC | PRN
  Administered 2015-12-22 – 2015-12-29 (×11): 20 mg via INTRAVENOUS
  Filled 2015-12-22 (×11): qty 1

## 2015-12-22 MED ORDER — LABETALOL HCL 5 MG/ML IV SOLN
20.0000 mg | INTRAVENOUS | Status: DC
  Administered 2015-12-22 – 2015-12-25 (×11): 20 mg via INTRAVENOUS
  Filled 2015-12-22 (×15): qty 4

## 2015-12-22 MED ORDER — NICARDIPINE HCL IN NACL 20-0.86 MG/200ML-% IV SOLN: 3.0000 mg/h | INTRAVENOUS | Status: DC

## 2015-12-22 MED ORDER — NITROGLYCERIN 2 % TD OINT
1.0000 [in_us] | TOPICAL_OINTMENT | Freq: Four times a day (QID) | TRANSDERMAL | Status: DC
  Administered 2015-12-22 – 2015-12-23 (×4): 1 [in_us] via TOPICAL
  Filled 2015-12-22: qty 1
  Filled 2015-12-22: qty 30
  Filled 2015-12-22: qty 1

## 2015-12-22 MED ORDER — HYDROMORPHONE HCL 1 MG/ML IJ SOLN
1.0000 mg | INTRAMUSCULAR | Status: AC | PRN
  Administered 2015-12-22 (×4): 1 mg via INTRAVENOUS
  Filled 2015-12-22 (×4): qty 1

## 2015-12-22 MED ORDER — ENOXAPARIN SODIUM 40 MG/0.4ML ~~LOC~~ SOLN
40.0000 mg | SUBCUTANEOUS | Status: DC
  Administered 2015-12-22 – 2015-12-23 (×2): 40 mg via SUBCUTANEOUS
  Filled 2015-12-22 (×3): qty 0.4

## 2015-12-22 MED ORDER — ONDANSETRON HCL 4 MG PO TABS: 4.0000 mg | ORAL_TABLET | Freq: Four times a day (QID) | ORAL | Status: DC | PRN

## 2015-12-22 MED ORDER — SODIUM CHLORIDE 0.9 % IV SOLN
INTRAVENOUS | Status: AC
  Administered 2015-12-22: 23:00:00 via INTRAVENOUS

## 2015-12-22 MED ORDER — POTASSIUM CHLORIDE 10 MEQ/100ML IV SOLN
10.0000 meq | INTRAVENOUS | Status: AC
  Administered 2015-12-22 (×2): 10 meq via INTRAVENOUS
  Filled 2015-12-22 (×2): qty 100

## 2015-12-22 MED ORDER — ONDANSETRON HCL 4 MG/2ML IJ SOLN: 4.0000 mg | Freq: Four times a day (QID) | INTRAMUSCULAR | Status: DC | PRN

## 2015-12-22 MED ORDER — SODIUM CHLORIDE 0.9 % IV SOLN
INTRAVENOUS | Status: DC
  Administered 2015-12-22: 04:00:00 via INTRAVENOUS

## 2015-12-22 MED ORDER — HYDRALAZINE HCL 20 MG/ML IJ SOLN
20.0000 mg | Freq: Once | INTRAMUSCULAR | Status: AC
  Administered 2015-12-22: 20 mg via INTRAVENOUS
  Filled 2015-12-22: qty 1

## 2015-12-22 MED ORDER — HYDROMORPHONE HCL 1 MG/ML IJ SOLN
1.0000 mg | INTRAMUSCULAR | Status: DC | PRN
  Administered 2015-12-22 – 2015-12-24 (×9): 1 mg via INTRAVENOUS
  Filled 2015-12-22 (×9): qty 1

## 2015-12-22 MED ORDER — SODIUM CHLORIDE 0.9 % IV SOLN
1.0000 g | Freq: Once | INTRAVENOUS | Status: AC
  Administered 2015-12-22: 1 g via INTRAVENOUS
  Filled 2015-12-22: qty 10

## 2015-12-22 MED ORDER — SODIUM CHLORIDE 0.9% FLUSH
3.0000 mL | Freq: Two times a day (BID) | INTRAVENOUS | Status: DC
  Administered 2015-12-22 (×2): 3 mL via INTRAVENOUS

## 2015-12-22 MED ORDER — HYDRALAZINE HCL 20 MG/ML IJ SOLN
10.0000 mg | Freq: Once | INTRAMUSCULAR | Status: AC
  Administered 2015-12-22: 10 mg via INTRAVENOUS
  Filled 2015-12-22: qty 1

## 2015-12-22 MED ORDER — MAGNESIUM SULFATE 2 GM/50ML IV SOLN
2.0000 g | Freq: Once | INTRAVENOUS | Status: AC
  Administered 2015-12-22: 2 g via INTRAVENOUS
  Filled 2015-12-22: qty 50

## 2015-12-22 MED ORDER — HYDROMORPHONE HCL 1 MG/ML IJ SOLN
1.0000 mg | Freq: Once | INTRAMUSCULAR | Status: AC
Start: 2015-12-22 — End: 2015-12-22
  Administered 2015-12-22: 1 mg via INTRAVENOUS
  Filled 2015-12-22: qty 1

## 2015-12-22 MED ORDER — SODIUM CHLORIDE 0.9 % IV SOLN
INTRAVENOUS | Status: DC
  Administered 2015-12-22: 23:00:00 via INTRAVENOUS

## 2015-12-22 NOTE — ED Notes (Signed)
Dr.Carter aware of repeat lab values. Potassium runs to be held at this time

## 2015-12-22 NOTE — Care Management Note (Signed)
Case Management Note  Patient Details  Name: Timothy [name] MRN: [id number] Date of Birth: 03/03/1976  Subjective/Objective:                  40 yo Pt to ED by EMS c/o chest pressure/epigastric pain onset at 1600. At 2100, pt became diaphoretic with NV. /Home with spouse.  Action/Plan: Follow for disposition needs. /Admit to hospital.   Expected Discharge Date:  12/25/15               Expected Discharge Plan:  Home/Self Care  In-House Referral:     Discharge planning Services  CM Consult  Post Acute Care Choice:    Choice offered to:     DME Arranged:    DME Agency:     HH Arranged:    HH Agency:     Status of Service:  In process, will continue to follow  Medicare Important Message Given:    Date Medicare IM Given:    Medicare IM give by:    Date Additional Medicare IM Given:    Additional Medicare Important Message give by:     If discussed at Long Length of Stay Meetings, dates discussed:    Additional Comments:  Oletta CohnWood, Kavish Lafitte, RN 12/22/2015, 10:59 AM

## 2015-12-22 NOTE — ED Notes (Signed)
Admitting MD at bedside.

## 2015-12-22 NOTE — Consult Note (Signed)
Reason for Consult: gallstone pancreatitis Referring Physician: Dr. Cristal Ford   HPI: Timothy [name] is a 40 year old male with a history of hypertension who presented to [place] last night with sudden onset nausea and vomiting followed by abdominal pain.  Location is generalized.  Severe in severity.  Time pattern is constant.  No aggravating or alleviating factors.  No modifying factors.  Denies previous symptoms.  He endorses to drinking 1 beer per night.  Denies previous abdominal surgeries.  He has a CT angio which was negative for dissection, but noted pancreatitis.  Lipase was >3000, this AM dows to 891.  Elevated LFTs with TB 1.7, down to 1.3 now. WBC 21.9, down to 14.9k.  Abdominal US reveals cholelithiasis, CBD is 24m.  We have therefore been asked to evaluate.  At present time, pain is down to a 4-5 from 8-9.  No further nausea or vomiting.   He is NPO and on IVF.   Past Medical History  Diagnosis Date  . Hypertension     Past Surgical History  Procedure Laterality Date  . Back surgery      History reviewed. No pertinent family history.  Social History:  reports that he has never smoked. He does not have any smokeless tobacco history on file. He reports that he drinks alcohol. He reports that he does not use illicit drugs.  Allergies: No Known Allergies  Medications:  Scheduled Meds: . enoxaparin (LOVENOX) injection  40 mg Subcutaneous Q24H  . labetalol  20 mg Intravenous Q4H  . nitroGLYCERIN  1 inch Topical Q6H  . sodium chloride flush  3 mL Intravenous Q12H   Continuous Infusions: . sodium chloride 125 mL/hr at 12/22/15 0513   PRN Meds:.hydrALAZINE, HYDROmorphone (DILAUDID) injection, ondansetron **OR** ondansetron (ZOFRAN) IV   Results for orders placed or performed during the hospital encounter of 12/21/15 (from the past 48 hour(s))  Troponin I     Status: None   Collection Time: 12/21/15 10:15 PM  Result Value Ref Range   Troponin I <0.03 <0.031 ng/mL     Comment:        NO INDICATION OF MYOCARDIAL INJURY.   Comprehensive metabolic panel     Status: Abnormal   Collection Time: 12/21/15 10:15 PM  Result Value Ref Range   Sodium 141 135 - 145 mmol/L   Potassium 3.0 (L) 3.5 - 5.1 mmol/L   Chloride 107 101 - 111 mmol/L   CO2 20 (L) 22 - 32 mmol/L   Glucose, Bld 238 (H) 65 - 99 mg/dL   BUN 18 6 - 20 mg/dL   Creatinine, Ser 1.60 (H) 0.61 - 1.24 mg/dL   Calcium 5.4 (LL) 8.9 - 10.3 mg/dL    Comment: CRITICAL RESULT CALLED TO, READ BACK BY AND VERIFIED WITH: PHELPS,C RN 12/21/2015 2335 JORDANS    Total Protein 6.5 6.5 - 8.1 g/dL   Albumin 4.3 3.5 - 5.0 g/dL   AST 218 (H) 15 - 41 U/L   ALT 227 (H) 17 - 63 U/L   Alkaline Phosphatase 58 38 - 126 U/L   Total Bilirubin 1.7 (H) 0.3 - 1.2 mg/dL   GFR calc non Af Amer 52 (L) >60 mL/min   GFR calc Af Amer >60 >60 mL/min    Comment: (NOTE) The eGFR has been calculated using the CKD EPI equation. This calculation has not been validated in all clinical situations. eGFR's persistently <60 mL/min signify possible Chronic Kidney Disease.    Anion gap 14 5 - 15  CBC with Differential     Status: Abnormal   Collection Time: 12/21/15 10:15 PM  Result Value Ref Range   WBC 21.9 (H) 4.0 - 10.5 K/uL   RBC 5.54 4.22 - 5.81 MIL/uL   Hemoglobin 17.1 (H) 13.0 - 17.0 g/dL   HCT 47.7 39.0 - 52.0 %   MCV 86.1 78.0 - 100.0 fL   MCH 30.9 26.0 - 34.0 pg   MCHC 35.8 30.0 - 36.0 g/dL   RDW 12.1 11.5 - 15.5 %   Platelets 354 150 - 400 K/uL   Neutrophils Relative % 80 %   Neutro Abs 17.6 (H) 1.7 - 7.7 K/uL   Lymphocytes Relative 12 %   Lymphs Abs 2.6 0.7 - 4.0 K/uL   Monocytes Relative 8 %   Monocytes Absolute 1.7 (H) 0.1 - 1.0 K/uL   Eosinophils Relative 0 %   Eosinophils Absolute 0.0 0.0 - 0.7 K/uL   Basophils Relative 0 %   Basophils Absolute 0.0 0.0 - 0.1 K/uL  Protime-INR     Status: None   Collection Time: 12/21/15 10:15 PM  Result Value Ref Range   Prothrombin Time 14.7 11.6 - 15.2 seconds   INR  1.14 0.00 - 1.49  Lipase, blood     Status: Abnormal   Collection Time: 12/21/15 10:15 PM  Result Value Ref Range   Lipase >3000 (H) 11 - 51 U/L    Comment: CORRECTED RESULTS CALLED TO: BRITTANY CHANDLER,RN AT 4235 12/22/15 BY ZBEECH. CORRECTED ON 05/23 AT 0956: PREVIOUSLY REPORTED AS 3720 RESULTS CONFIRMED BY MANUAL DILUTION   Type and screen [place]     Status: None   Collection Time: 12/21/15 10:24 PM  Result Value Ref Range   ABO/RH(D) AB POS    Antibody Screen NEG    Sample Expiration 12/24/2015   ABO/Rh     Status: None   Collection Time: 12/21/15 10:24 PM  Result Value Ref Range   ABO/RH(D) AB POS   I-stat Chem 8, ED     Status: Abnormal   Collection Time: 12/21/15 10:52 PM  Result Value Ref Range   Sodium 144 135 - 145 mmol/L   Potassium 3.0 (L) 3.5 - 5.1 mmol/L   Chloride 105 101 - 111 mmol/L   BUN 21 (H) 6 - 20 mg/dL   Creatinine, Ser 1.40 (H) 0.61 - 1.24 mg/dL   Glucose, Bld 235 (H) 65 - 99 mg/dL   Calcium, Ion 1.05 (L) 1.12 - 1.23 mmol/L   TCO2 22 0 - 100 mmol/L   Hemoglobin 17.7 (H) 13.0 - 17.0 g/dL   HCT 52.0 39.0 - 52.0 %  Blood culture (routine x 2)     Status: None (Preliminary result)   Collection Time: 12/22/15 12:20 AM  Result Value Ref Range   Specimen Description BLOOD RIGHT ANTECUBITAL    Special Requests BOTTLES DRAWN AEROBIC AND ANAEROBIC 5CC    Culture PENDING    Report Status PENDING   I-Stat CG4 Lactic Acid, ED     Status: Abnormal   Collection Time: 12/22/15 12:22 AM  Result Value Ref Range   Lactic Acid, Venous 3.06 (HH) 0.5 - 2.0 mmol/L   Comment NOTIFIED PHYSICIAN   I-Stat CG4 Lactic Acid, ED     Status: Abnormal   Collection Time: 12/22/15  2:45 AM  Result Value Ref Range   Lactic Acid, Venous 4.55 (HH) 0.5 - 2.0 mmol/L   Comment NOTIFIED PHYSICIAN   CBC     Status: Abnormal  Collection Time: 12/22/15  5:00 AM  Result Value Ref Range   WBC 14.9 (H) 4.0 - 10.5 K/uL   RBC 5.73 4.22 - 5.81 MIL/uL   Hemoglobin  17.2 (H) 13.0 - 17.0 g/dL   HCT 49.2 39.0 - 52.0 %   MCV 85.9 78.0 - 100.0 fL   MCH 30.0 26.0 - 34.0 pg   MCHC 35.0 30.0 - 36.0 g/dL   RDW 12.5 11.5 - 15.5 %   Platelets 334 150 - 400 K/uL  Comprehensive metabolic panel     Status: Abnormal   Collection Time: 12/22/15  5:00 AM  Result Value Ref Range   Sodium 140 135 - 145 mmol/L   Potassium 4.7 3.5 - 5.1 mmol/L    Comment: DELTA CHECK NOTED   Chloride 108 101 - 111 mmol/L   CO2 19 (L) 22 - 32 mmol/L   Glucose, Bld 293 (H) 65 - 99 mg/dL   BUN 16 6 - 20 mg/dL   Creatinine, Ser 1.19 0.61 - 1.24 mg/dL   Calcium 8.6 (L) 8.9 - 10.3 mg/dL    Comment: DELTA CHECK NOTED   Total Protein 6.8 6.5 - 8.1 g/dL   Albumin 4.0 3.5 - 5.0 g/dL   AST 183 (H) 15 - 41 U/L   ALT 322 (H) 17 - 63 U/L   Alkaline Phosphatase 60 38 - 126 U/L   Total Bilirubin 1.3 (H) 0.3 - 1.2 mg/dL   GFR calc non Af Amer >60 >60 mL/min   GFR calc Af Amer >60 >60 mL/min    Comment: (NOTE) The eGFR has been calculated using the CKD EPI equation. This calculation has not been validated in all clinical situations. eGFR's persistently <60 mL/min signify possible Chronic Kidney Disease.    Anion gap 13 5 - 15  Lipid panel     Status: Abnormal   Collection Time: 12/22/15  5:00 AM  Result Value Ref Range   Cholesterol 187 0 - 200 mg/dL   Triglycerides 63 <150 mg/dL   HDL 41 >40 mg/dL   Total CHOL/HDL Ratio 4.6 RATIO   VLDL 13 0 - 40 mg/dL   LDL Cholesterol 133 (H) 0 - 99 mg/dL    Comment:        Total Cholesterol/HDL:CHD Risk Coronary Heart Disease Risk Table                     Men   Women  1/2 Average Risk   3.4   3.3  Average Risk       5.0   4.4  2 X Average Risk   9.6   7.1  3 X Average Risk  23.4   11.0        Use the calculated Patient Ratio above and the CHD Risk Table to determine the patient's CHD Risk.        ATP III CLASSIFICATION (LDL):  <100     mg/dL   Optimal  100-129  mg/dL   Near or Above                    Optimal  130-159  mg/dL    Borderline  160-189  mg/dL   High  >190     mg/dL   Very High   Lipase, blood     Status: Abnormal   Collection Time: 12/22/15  5:00 AM  Result Value Ref Range   Lipase 891 (H) 11 - 51 U/L    Comment: RESULTS CONFIRMED  BY MANUAL DILUTION  I-Stat CG4 Lactic Acid, ED     Status: Abnormal   Collection Time: 12/22/15  5:06 AM  Result Value Ref Range   Lactic Acid, Venous 2.64 (HH) 0.5 - 2.0 mmol/L   Comment NOTIFIED PHYSICIAN     Dg Chest Portable 1 View  12/21/2015  CLINICAL DATA:  Substernal pain epigastric pain beginning tonight. EXAM: PORTABLE CHEST 1 VIEW COMPARISON:  None. FINDINGS: Lungs are adequately inflated without consolidation or effusion. Cardiomediastinal silhouette is within normal. Bones and soft tissues are normal. IMPRESSION: No active disease. Electronically Signed   By: Marin Olp M.D.   On: 12/21/2015 23:49   Ct Angio Chest/abd/pel For Dissection W And/or W/wo  12/22/2015  CLINICAL DATA:  Chest pressure and epigastric pain for 1 day. Nausea and vomiting with diaphoresis. Clinical concern for dissection. EXAM: CT ANGIOGRAPHY CHEST, ABDOMEN AND PELVIS TECHNIQUE: Multidetector CT imaging through the chest, abdomen and pelvis was performed using the standard protocol during bolus administration of intravenous contrast. Multiplanar reconstructed images and MIPs were obtained and reviewed to evaluate the vascular anatomy. CONTRAST:  100 cc Isovue 370 IV COMPARISON:  Chest radiograph earlier this day. FINDINGS: CTA CHEST FINDINGS Normal caliber thoracic aorta without aneurysm, dissection, hematoma or acute aortic abnormality. There is a conventional branching pattern from the aortic arch. No significant atherosclerosis. No filling defects in the central pulmonary arteries to suggest pulmonary embolus. Heart is normal in size. Left hilar calcification, sequela of prior granulomatous disease. No pericardial effusion. No adenopathy. Minimal dependent atelectasis in both lower lobes. 3  mm pleural based nodule in the right lower lobe series 5, image 35 is likely a calcified granuloma. 4 mm left upper lobe granuloma, series 5, image 15. No pleural effusion. Mild distal esophageal thickening. Included thyroid gland is normal. There are no acute or suspicious osseous abnormalities. Mild degenerative disc disease in the thoracic spine. Review of the MIP images confirms the above findings. CTA ABDOMEN AND PELVIS FINDINGS Normal caliber abdominal aorta without dissection or aneurysm. No significant atherosclerosis. Celiac, superior mesenteric, and inferior mesenteric arteries are widely patent. Single bilateral renal arteries are patent, early branching of the left renal artery. Bilateral common and external iliac arteries are normal. Extensive peripancreatic fat stranding extending from the head, through the body and tail. There is adjacent peripancreatic free fluid, no loculated fluid collection. Moderate fluid tracks in the right and left anterior perirenal space into the pericolic gutter. Fluid tracks in the left upper quadrant about the medial spleen. Decreased density of the liver consistent with steatosis. Mild gallbladder distention, no calcified stone. Arterial phase imaging of the spleen and adrenal glands is normal. Symmetric renal enhancement, no hydronephrosis. Stomach distended with ingested contents. Mild bowel wall thickening of small bowel loops in the left upper quadrant, likely reactive secondary to pancreatic inflammation. No small bowel distention. The appendix is normal. Small volume colonic stool. Minimal diverticulosis of sigmoid colon, no diverticulitis. No free air. No evidence of retroperitoneal or mesenteric adenopathy. In the pelvis the bladder is physiologically distended. Prostate gland normal in size. Fat within both inguinal canals. Scattered degenerative change throughout the lumbar spine with degenerative disc disease at L5-S1 and facet arthropathy most prominent at  L3-L4. Review of the MIP images confirms the above findings. IMPRESSION: 1. Normal caliber thoracoabdominal aorta without dissection or acute aortic abnormality. 2. Marked peripancreatic inflammation consistent with pancreatitis. Peripancreatic fluid tracking in the anterior para renal space and both pericolic gutters. 3. Mild gallbladder distention, no calcified stone.  Hepatic steatosis. 4. Sequela of granulomatous disease in the chest. Electronically Signed   By: Jeb Levering M.D.   On: 12/22/2015 01:24   US Abdomen Limited Ruq  12/22/2015  CLINICAL DATA:  Initial evaluation for acute chest pain, epigastric pressure, nausea, vomiting. EXAM: US ABDOMEN LIMITED - RIGHT UPPER QUADRANT COMPARISON:  None. FINDINGS: Gallbladder: Multiple echogenic stones present within the gallbladder lumen, largest of which measured 2.4 cm. Small amount of sludge present as well. Gallbladder wall measured within normal limits at 3 mm. No free pericholecystic fluid. No sonographic Murphy sign elicited on exam. Probable focal fatty sparing noted within the liver adjacent to the gallbladder fossa. Common bile duct: Diameter: 4 mm Liver: Diffusely increased echogenicity, suggestive of steatosis. No focal lesions. IMPRESSION: 1. Cholelithiasis without sonographic evidence for acute cholecystitis or biliary dilatation. 2. Hepatic steatosis. Electronically Signed   By: Jeannine Boga M.D.   On: 12/22/2015 04:04    Review of Systems  Constitutional: Positive for malaise/fatigue and diaphoresis. Negative for fever, chills and weight loss.  Eyes: Negative for blurred vision, double vision, photophobia, pain, discharge and redness.  Respiratory: Negative for cough, hemoptysis, sputum production and shortness of breath.   Cardiovascular: Negative for chest pain, palpitations, orthopnea, claudication, leg swelling and PND.  Gastrointestinal: Positive for nausea, vomiting and abdominal pain. Negative for diarrhea, constipation,  blood in stool and melena.  Genitourinary: Negative for dysuria, urgency, frequency, hematuria and flank pain.  Neurological: Negative for dizziness, tingling, tremors, sensory change, speech change, focal weakness, seizures, loss of consciousness and weakness.  Psychiatric/Behavioral: Negative for depression, suicidal ideas, hallucinations, memory loss and substance abuse. The patient is not nervous/anxious and does not have insomnia.    Blood pressure 141/83, pulse 95, temperature 97.6 F (36.4 C), temperature source Oral, resp. rate 35, SpO2 94 %. Physical Exam  Constitutional: He is oriented to person, place, and time. He appears well-developed and well-nourished. No distress.  Cardiovascular: Normal rate, regular rhythm, normal heart sounds and intact distal pulses.  Exam reveals no gallop and no friction rub.   No murmur heard. Respiratory: Effort normal and breath sounds normal. No respiratory distress. He has no wheezes. He has no rales. He exhibits no tenderness.  GI: Soft. Bowel sounds are normal. He exhibits no mass. There is no rebound and no guarding.  TTP upper abdomen, most appreciated to ruq.   Musculoskeletal: Normal range of motion. He exhibits no edema or tenderness.  Neurological: He is alert and oriented to person, place, and time.  Skin: Skin is warm and dry. No rash noted. He is not diaphoretic. No erythema. No pallor.  Psychiatric: He has a normal mood and affect. His behavior is normal. Judgment and thought content normal.    Assessment/Plan: Gallstone pancreatitis-agree with bowel rest, IV hydration, pain control.  We will proceed with a laparoscopic cholecystectomy once pancreatitis has resolved.  Thank you for the consult.  Will follow along.  FEN-NPO, IVF VTE prophylaxis-SCD/lovenox   Carloyn Lahue ANP-BC  12/22/2015, 11:13 AM

## 2015-12-22 NOTE — H&P (Addendum)
History and Physical    Timothy CoyQuinten [name] ZOX:[id number] DOB: 12-06-1975 DOA: 12/21/2015  PCP: Clayborn HeronVictoria R Rankins, MD  Patient coming from: Home  Chief Complaint: Epigastric pain, nausea, and vomiting  HPI: Timothy CoyQuinten [name] is a 40 y.o. gentleman with a history of HTN, recently started on lisinopril for management, who feels that he was in his baseline state of health until he developed epigastric pain around 5pm, which he attributed to gas.  He arrived home from work, had dinner per routine, and got in bed around 8:30pm.  Shortly after that, he developed nausea and vomiting x2 (nonbloody) followed by increased intensity in his epigastric pain with radiation to his RUQ and into his back.  He had associated diaphoresis.  No chest pain, light-headedness, or near syncope.  He was short of breath secondary to the pain.  No history of prior gallbladder issues.  No history of GERD.  He drinks beer 2-3 times per week, but no history of binge drinking.  ED Course: The patient presented with markedly elevated blood pressure and there was an initial concern for aortic dissection.  Fortunately, CTA of the chest/abdomen/pelvis is negative for dissection, but the patient has marked inflammation of the pancreas, with gallbladder distention.  Lipase markedly elevated to 3700.  RUQ ultrasound pending.  Blood pressure has remained markedly elevated (systolic greater than 200), despite multiple rounds of IV pain medications as well as IV anti-hypertensives.  The patient also has multiple electrolyte abnormalities.  The patient is being admitted to the stepdown unit, primarily for aggressive blood pressure management.  Review of Systems: As per HPI otherwise 10 point review of systems negative.   Past Medical History  Diagnosis Date  . Hypertension     Past Surgical History  Procedure Laterality Date  . Back surgery    Lower back surgery in 2007   reports that he has never smoked. He does not have any smokeless  tobacco history on file. He reports that he drinks alcohol. He reports that he does not use illicit drugs. Drinks a can of beer 2-3 times per week.  He is married.  He has one daughter.  No Known Allergies  FAMILY HISTORY: He denies any known family history of GI disorders.  Prior to Admission medications   Medication Sig Start Date End Date Taking? Authorizing Provider  lisinopril (PRINIVIL,ZESTRIL) 5 MG tablet Take 5 mg by mouth every evening.   Yes Historical Provider, MD    Physical Exam: Filed Vitals:   12/22/15 0125 12/22/15 0130 12/22/15 0213 12/22/15 0215  BP: 220/130 211/124 214/123 223/125  Pulse:  67 70 71  Temp:      TempSrc:      Resp:  29 24 32  SpO2:  95% 100% 99%      Constitutional: NAD, calm but ill appearing  Filed Vitals:   12/22/15 0125 12/22/15 0130 12/22/15 0213 12/22/15 0215  BP: 220/130 211/124 214/123 223/125  Pulse:  67 70 71  Temp:      TempSrc:      Resp:  29 24 32  SpO2:  95% 100% 99%   Eyes: PERRL, lids and conjunctivae normal ENMT: Mucous membranes are slightly dry. Posterior pharynx clear of any exudate or lesions. Normal dentition.  Neck: normal, supple, no masses Respiratory: clear to auscultation bilaterally, no wheezing, no crackles. Normal respiratory effort. No accessory muscle use.  Cardiovascular: Regular rate and rhythm, no murmurs / rubs / gallops. No extremity edema. 2+ pedal pulses. Abdomen: Obese abdomen is diffusely  tender but no significant guarding.  Bowel sounds are hypoactive. Musculoskeletal: no clubbing / cyanosis. No joint deformity upper and lower extremities. Good ROM, no contractures. Normal muscle tone.  Skin: no rashes, lesions, ulcers. No induration Neurologic: CN 2-12 grossly intact. Sensation intact, Strength 5/5 in all 4.  Psychiatric: Normal judgment and insight. Alert and oriented x 3. Normal mood.    Labs on Admission: I have personally reviewed following labs and imaging studies  CBC:  Recent  Labs Lab 12/21/15 2215 12/21/15 2252  WBC 21.9*  --   NEUTROABS 17.6*  --   HGB 17.1* 17.7*  HCT 47.7 52.0  MCV 86.1  --   PLT 354  --    Basic Metabolic Panel:  Recent Labs Lab 12/21/15 2215 12/21/15 2252  NA 141 144  K 3.0* 3.0*  CL 107 105  CO2 20*  --   GLUCOSE 238* 235*  BUN 18 21*  CREATININE 1.60* 1.40*  CALCIUM 5.4*  --    Liver Function Tests:  Recent Labs Lab 12/21/15 2215  AST 218*  ALT 227*  ALKPHOS 58  BILITOT 1.7*  PROT 6.5  ALBUMIN 4.3    Recent Labs Lab 12/21/15 2215  LIPASE 3720*   Coagulation Profile:  Recent Labs Lab 12/21/15 2215  INR 1.14   Cardiac Enzymes:  Recent Labs Lab 12/21/15 2215  TROPONINI <0.03   Sepsis Labs:  Lactic acid 3  Recent Results (from the past 240 hour(s))  Blood culture (routine x 2)     Status: None (Preliminary result)   Collection Time: 12/22/15 12:20 AM  Result Value Ref Range Status   Specimen Description BLOOD RIGHT ANTECUBITAL  Final   Special Requests BOTTLES DRAWN AEROBIC AND ANAEROBIC 5CC  Final   Culture PENDING  Incomplete   Report Status PENDING  Incomplete     Radiological Exams on Admission: Dg Chest Portable 1 View  12/21/2015  CLINICAL DATA:  Substernal pain epigastric pain beginning tonight. EXAM: PORTABLE CHEST 1 VIEW COMPARISON:  None. FINDINGS: Lungs are adequately inflated without consolidation or effusion. Cardiomediastinal silhouette is within normal. Bones and soft tissues are normal. IMPRESSION: No active disease. Electronically Signed   By: Elberta Fortis M.D.   On: 12/21/2015 23:49   Ct Angio Chest/abd/pel For Dissection W And/or W/wo  12/22/2015  CLINICAL DATA:  Chest pressure and epigastric pain for 1 day. Nausea and vomiting with diaphoresis. Clinical concern for dissection. EXAM: CT ANGIOGRAPHY CHEST, ABDOMEN AND PELVIS TECHNIQUE: Multidetector CT imaging through the chest, abdomen and pelvis was performed using the standard protocol during bolus administration of  intravenous contrast. Multiplanar reconstructed images and MIPs were obtained and reviewed to evaluate the vascular anatomy. CONTRAST:  100 cc Isovue 370 IV COMPARISON:  Chest radiograph earlier this day. FINDINGS: CTA CHEST FINDINGS Normal caliber thoracic aorta without aneurysm, dissection, hematoma or acute aortic abnormality. There is a conventional branching pattern from the aortic arch. No significant atherosclerosis. No filling defects in the central pulmonary arteries to suggest pulmonary embolus. Heart is normal in size. Left hilar calcification, sequela of prior granulomatous disease. No pericardial effusion. No adenopathy. Minimal dependent atelectasis in both lower lobes. 3 mm pleural based nodule in the right lower lobe series 5, image 35 is likely a calcified granuloma. 4 mm left upper lobe granuloma, series 5, image 15. No pleural effusion. Mild distal esophageal thickening. Included thyroid gland is normal. There are no acute or suspicious osseous abnormalities. Mild degenerative disc disease in the thoracic spine. Review of the  MIP images confirms the above findings. CTA ABDOMEN AND PELVIS FINDINGS Normal caliber abdominal aorta without dissection or aneurysm. No significant atherosclerosis. Celiac, superior mesenteric, and inferior mesenteric arteries are widely patent. Single bilateral renal arteries are patent, early branching of the left renal artery. Bilateral common and external iliac arteries are normal. Extensive peripancreatic fat stranding extending from the head, through the body and tail. There is adjacent peripancreatic free fluid, no loculated fluid collection. Moderate fluid tracks in the right and left anterior perirenal space into the pericolic gutter. Fluid tracks in the left upper quadrant about the medial spleen. Decreased density of the liver consistent with steatosis. Mild gallbladder distention, no calcified stone. Arterial phase imaging of the spleen and adrenal glands is  normal. Symmetric renal enhancement, no hydronephrosis. Stomach distended with ingested contents. Mild bowel wall thickening of small bowel loops in the left upper quadrant, likely reactive secondary to pancreatic inflammation. No small bowel distention. The appendix is normal. Small volume colonic stool. Minimal diverticulosis of sigmoid colon, no diverticulitis. No free air. No evidence of retroperitoneal or mesenteric adenopathy. In the pelvis the bladder is physiologically distended. Prostate gland normal in size. Fat within both inguinal canals. Scattered degenerative change throughout the lumbar spine with degenerative disc disease at L5-S1 and facet arthropathy most prominent at L3-L4. Review of the MIP images confirms the above findings. IMPRESSION: 1. Normal caliber thoracoabdominal aorta without dissection or acute aortic abnormality. 2. Marked peripancreatic inflammation consistent with pancreatitis. Peripancreatic fluid tracking in the anterior para renal space and both pericolic gutters. 3. Mild gallbladder distention, no calcified stone. Hepatic steatosis. 4. Sequela of granulomatous disease in the chest. Electronically Signed   By: Rubye Oaks M.D.   On: 12/22/2015 01:24    EKG: Independently reviewed. NSR, no acute ST segment changes.  Assessment/Plan Principal Problem:   Acute pancreatitis Active Problems:   Hypocalcemia   AKI (acute kidney injury) (HCC)   Accelerated hypertension   Leukocytosis   Lactic acidosis   Hyperglycemia   Obesity   Hypokalemia  Acute pancreatitis with abnormal LFTs, gallbladder distention. RUQ U/S pending. --Bowel rest --Aggressive IV fluid resuscitation --Analgesics and anti-emetics as needed --Will need and GI, and possibly general surgery, consult in the AM --May need to consider empiric antibiotic coverage (concern for sepsis) if lactic acid and WBC count remain elevated --Repeat LFTs in the AM to look for trend, RUQ U/S results  pending.  Leukocytosis --Initially thought to be reactive, but could be sign of early sepsis.  Repeat CBC in the AM.  Hypocalcemia --IV replacement ordered  Hypokalemia --IV replacement ordered --Empiric IV magnesium ordered as well  AKI --Hold lisinopril for now --Follow-up repeat Creatinine after aggressive hydration  Hyperglycemia --Will check A1c  Accelerated HTN --BP responded to increased dose of IV hydralazine.  Will continue to monitor for now.  He may need higher level of care if he needs continuous infusion (I have been informed that IV cardene cannot be given in the stepdown unit; will confer with pharmacy re: alternative options that can be given in the stepdown unit, if needed).  DVT prophylaxis: Lovenox Code Status: FULL Family Communication: Patient alone at time of admission Disposition Plan: Home, when stable and tolerating PO Consults called: NONE.  Will need GI, and possibly general surgery, consult in the AM. Admission status: Inpatient, stepdown unit   Jerene Bears MD [facility]  If 7PM-7AM, please contact night-coverage [email or web address] Password [facility]  12/22/2015, 2:39 AM

## 2015-12-22 NOTE — Progress Notes (Signed)
Report received from [place]ica, [place]. Awaiting patient's arrival

## 2015-12-22 NOTE — Progress Notes (Signed)
Patient admitted earlier today by Dr. Michael LitterNikki Carter. See full H&P for details.  40 year old male with history of hypertension, presented to the emergency department with complaints of epigastric pain, nausea and vomiting. Patient admitted for sepsis/acute pancreatitis abnormal LFTs. Right upper quadrant ultrasound showed cholelithiasis without evidence of acute cholecystitis or biliary dilatation. CTA chest, abdomen and pelvis, showed. Pancreatic inflammation consistent with pancreatitis, per pancreatic fluid tracking in the anterior pararenal space and both pericolic gutters, mild gallbladder distention no calcified stone. Surgery consulted for possible laparoscopic cholecystectomy and IOC. Plan for surgery once patient's pancreatitis has resolved. Continue supportive care for the time being. Patient also noted to have accelerated hypertension, continue IV medications as needed.  Time spent: 20 minutes  Zander Ingham D.O. [facility] Pager [phone]  If 7PM-7AM, please contact night-coverage [email or web address] Password [email or web address] 12/22/2015, 2:16 PM

## 2015-12-22 NOTE — ED Notes (Addendum)
Spoke to Dr.Carter regarding blood pressure and elevated lactic, second bolus in process.

## 2015-12-22 NOTE — Progress Notes (Signed)
CRITICAL VALUE ALERT  Critical value received:  Lactic acid 3.4  Date of notification: 12/22/2015  Time of notification:  05:20  Critical value read back:Yes.    Nurse who received alert: Laney PotashNana RN  MD notified (1st page):  Dr. Nunzio CoryMikhail,M  Time of first page:   MD notified (2nd page):  Time of second page:  Responding MD:   Dr.Mikhail  Time MD responded:

## 2015-12-23 LAB — COMPREHENSIVE METABOLIC PANEL
ALT: 156 U/L — ABNORMAL HIGH (ref 17–63)
AST: 67 U/L — ABNORMAL HIGH (ref 15–41)
Albumin: 3.2 g/dL — ABNORMAL LOW (ref 3.5–5.0)
Alkaline Phosphatase: 48 U/L (ref 38–126)
Anion gap: 11 (ref 5–15)
BUN: 51 mg/dL — ABNORMAL HIGH (ref 6–20)
CO2: 16 mmol/L — ABNORMAL LOW (ref 22–32)
Calcium: 7.6 mg/dL — ABNORMAL LOW (ref 8.9–10.3)
Chloride: 112 mmol/L — ABNORMAL HIGH (ref 101–111)
Creatinine, Ser: 3.12 mg/dL — ABNORMAL HIGH (ref 0.61–1.24)
GFR calc Af Amer: 27 mL/min — ABNORMAL LOW (ref >60)
GFR calc non Af Amer: 23 mL/min — ABNORMAL LOW (ref >60)
Glucose, Bld: 406 mg/dL — ABNORMAL HIGH (ref 65–99)
Potassium: 4.4 mmol/L (ref 3.5–5.1)
Sodium: 139 mmol/L (ref 135–145)
Total Bilirubin: 1.2 mg/dL (ref 0.3–1.2)
Total Protein: 5.7 g/dL — ABNORMAL LOW (ref 6.5–8.1)

## 2015-12-23 LAB — GLUCOSE, CAPILLARY
Glucose-Capillary: 139 mg/dL — ABNORMAL HIGH (ref 65–99)
Glucose-Capillary: 153 mg/dL — ABNORMAL HIGH (ref 65–99)
Glucose-Capillary: 155 mg/dL — ABNORMAL HIGH (ref 65–99)
Glucose-Capillary: 156 mg/dL — ABNORMAL HIGH (ref 65–99)
Glucose-Capillary: 161 mg/dL — ABNORMAL HIGH (ref 65–99)
Glucose-Capillary: 166 mg/dL — ABNORMAL HIGH (ref 65–99)
Glucose-Capillary: 169 mg/dL — ABNORMAL HIGH (ref 65–99)
Glucose-Capillary: 187 mg/dL — ABNORMAL HIGH (ref 65–99)
Glucose-Capillary: 198 mg/dL — ABNORMAL HIGH (ref 65–99)
Glucose-Capillary: 216 mg/dL — ABNORMAL HIGH (ref 65–99)
Glucose-Capillary: 217 mg/dL — ABNORMAL HIGH (ref 65–99)
Glucose-Capillary: 234 mg/dL — ABNORMAL HIGH (ref 65–99)
Glucose-Capillary: 244 mg/dL — ABNORMAL HIGH (ref 65–99)
Glucose-Capillary: 260 mg/dL — ABNORMAL HIGH (ref 65–99)
Glucose-Capillary: 273 mg/dL — ABNORMAL HIGH (ref 65–99)
Glucose-Capillary: 338 mg/dL — ABNORMAL HIGH (ref 65–99)
Glucose-Capillary: 349 mg/dL — ABNORMAL HIGH (ref 65–99)
Glucose-Capillary: 426 mg/dL — ABNORMAL HIGH (ref 65–99)
Glucose-Capillary: 526 mg/dL — ABNORMAL HIGH (ref 65–99)
Glucose-Capillary: 527 mg/dL — ABNORMAL HIGH (ref 65–99)
Glucose-Capillary: 532 mg/dL — ABNORMAL HIGH (ref 65–99)

## 2015-12-23 LAB — BASIC METABOLIC PANEL
Anion gap: 5 (ref 5–15)
Anion gap: 4 — ABNORMAL LOW (ref 5–15)
Anion gap: 7 (ref 5–15)
BUN: 56 mg/dL — ABNORMAL HIGH (ref 6–20)
BUN: 57 mg/dL — ABNORMAL HIGH (ref 6–20)
BUN: 56 mg/dL — ABNORMAL HIGH (ref 6–20)
CO2: 20 mmol/L — ABNORMAL LOW (ref 22–32)
CO2: 19 mmol/L — ABNORMAL LOW (ref 22–32)
CO2: 17 mmol/L — ABNORMAL LOW (ref 22–32)
Calcium: 7.1 mg/dL — ABNORMAL LOW (ref 8.9–10.3)
Calcium: 6.9 mg/dL — ABNORMAL LOW (ref 8.9–10.3)
Calcium: 6.8 mg/dL — ABNORMAL LOW (ref 8.9–10.3)
Chloride: 118 mmol/L — ABNORMAL HIGH (ref 101–111)
Chloride: 119 mmol/L — ABNORMAL HIGH (ref 101–111)
Chloride: 118 mmol/L — ABNORMAL HIGH (ref 101–111)
Creatinine, Ser: 2.81 mg/dL — ABNORMAL HIGH (ref 0.61–1.24)
Creatinine, Ser: 2.4 mg/dL — ABNORMAL HIGH (ref 0.61–1.24)
Creatinine, Ser: 2.01 mg/dL — ABNORMAL HIGH (ref 0.61–1.24)
GFR calc Af Amer: 31 mL/min — ABNORMAL LOW (ref >60)
GFR calc Af Amer: 37 mL/min — ABNORMAL LOW (ref >60)
GFR calc Af Amer: 46 mL/min — ABNORMAL LOW (ref >60)
GFR calc non Af Amer: 26 mL/min — ABNORMAL LOW (ref >60)
GFR calc non Af Amer: 32 mL/min — ABNORMAL LOW (ref >60)
GFR calc non Af Amer: 39 mL/min — ABNORMAL LOW (ref >60)
Glucose, Bld: 137 mg/dL — ABNORMAL HIGH (ref 65–99)
Glucose, Bld: 167 mg/dL — ABNORMAL HIGH (ref 65–99)
Glucose, Bld: 255 mg/dL — ABNORMAL HIGH (ref 65–99)
Potassium: 4.5 mmol/L (ref 3.5–5.1)
Potassium: 4.3 mmol/L (ref 3.5–5.1)
Potassium: 4.5 mmol/L (ref 3.5–5.1)
Sodium: 143 mmol/L (ref 135–145)
Sodium: 142 mmol/L (ref 135–145)
Sodium: 142 mmol/L (ref 135–145)

## 2015-12-23 LAB — LACTIC ACID, PLASMA
Lactic Acid, Venous: 4.2 mmol/L (ref 0.5–2.0)
Lactic Acid, Venous: 2.6 mmol/L (ref 0.5–2.0)
Lactic Acid, Venous: 1.5 mmol/L (ref 0.5–2.0)
Lactic Acid, Venous: 1.4 mmol/L (ref 0.5–2.0)

## 2015-12-23 LAB — CBC
HCT: 44.7 % (ref 39.0–52.0)
Hemoglobin: 14.7 g/dL (ref 13.0–17.0)
MCH: 29.9 pg (ref 26.0–34.0)
MCHC: 32.9 g/dL (ref 30.0–36.0)
MCV: 90.9 fL (ref 78.0–100.0)
Platelets: 274 10*3/uL (ref 150–400)
RBC: 4.92 MIL/uL (ref 4.22–5.81)
RDW: 13.2 % (ref 11.5–15.5)
WBC: 17.5 10*3/uL — ABNORMAL HIGH (ref 4.0–10.5)

## 2015-12-23 LAB — HEMOGLOBIN A1C
Hgb A1c MFr Bld: 5.7 % — ABNORMAL HIGH (ref 4.8–5.6)
Mean Plasma Glucose: 117 mg/dL

## 2015-12-23 LAB — LIPASE, BLOOD: Lipase: 1674 U/L — ABNORMAL HIGH (ref 11–51)

## 2015-12-23 MED ORDER — SODIUM CHLORIDE 0.9 % IV SOLN
500.0000 mg | Freq: Three times a day (TID) | INTRAVENOUS | Status: DC
  Administered 2015-12-23 – 2015-12-24 (×4): 500 mg via INTRAVENOUS
  Filled 2015-12-23 (×7): qty 500

## 2015-12-23 MED ORDER — SODIUM CHLORIDE 0.9 % IV BOLUS (SEPSIS)
500.0000 mL | Freq: Once | INTRAVENOUS | Status: AC
  Administered 2015-12-23: 500 mL via INTRAVENOUS

## 2015-12-23 MED ORDER — SODIUM CHLORIDE 0.9 % IV BOLUS (SEPSIS)
500.0000 mL | Freq: Once | INTRAVENOUS | Status: DC
Start: 2015-12-23 — End: 2015-12-23
  Administered 2015-12-23: 500 mL via INTRAVENOUS

## 2015-12-23 MED ORDER — SODIUM CHLORIDE 0.9 % IV BOLUS (SEPSIS)
1000.0000 mL | Freq: Once | INTRAVENOUS | Status: AC
  Administered 2015-12-23: 1000 mL via INTRAVENOUS

## 2015-12-23 MED ORDER — SODIUM CHLORIDE 0.9 % IV SOLN
INTRAVENOUS | Status: DC
  Administered 2015-12-23 – 2015-12-24 (×4): via INTRAVENOUS

## 2015-12-23 NOTE — Progress Notes (Signed)
Subjective: Reports abdominal pain moderate, minimally changed from yesterday  Objective: Vital signs in last 24 hours: Temp:  [98.2 F (36.8 C)-99.1 F (37.3 C)] 98.7 F (37.1 C) (05/24 0328) Pulse Rate:  [87-118] 107 (05/24 0328) Resp:  [18-43] 38 (05/24 0328) BP: (102-172)/(61-100) 116/61 mmHg (05/24 0328) SpO2:  [91 %-97 %] 93 % (05/24 0328) Weight:  [128.5 kg (283 lb 4.7 oz)] 128.5 kg (283 lb 4.7 oz) (05/23 1513) Last BM Date: 12/21/15  Intake/Output from previous day: 05/23 0701 - 05/24 0700 In: 4220.8 [I.V.:3220.8; IV Piggyback:1000] Out: -  Intake/Output this shift:    Abdomen obese, full, mild to moderately tender in epigastrium  Lab Results:   Recent Labs  12/22/15 0500 12/23/15 0435  WBC 14.9* 17.5*  HGB 17.2* 14.7  HCT 49.2 44.7  PLT 334 274   BMET  Recent Labs  12/22/15 2100 12/23/15 0435  NA 137 139  K 5.6* 4.4  CL 110 112*  CO2 16* 16*  GLUCOSE 550* 406*  BUN 38* 51*  CREATININE 2.26* 3.12*  CALCIUM 7.7* 7.6*   PT/INR  Recent Labs  12/21/15 2215  LABPROT 14.7  INR 1.14   ABG No results for input(s): PHART, HCO3 in the last 72 hours.  Invalid input(s): PCO2, PO2  Studies/Results: Dg Chest Portable 1 View  12/21/2015  CLINICAL DATA:  Substernal pain epigastric pain beginning tonight. EXAM: PORTABLE CHEST 1 VIEW COMPARISON:  None. FINDINGS: Lungs are adequately inflated without consolidation or effusion. Cardiomediastinal silhouette is within normal. Bones and soft tissues are normal. IMPRESSION: No active disease. Electronically Signed   By: Elberta Fortis M.D.   On: 12/21/2015 23:49   Ct Angio Chest/abd/pel For Dissection W And/or W/wo  12/22/2015  CLINICAL DATA:  Chest pressure and epigastric pain for 1 day. Nausea and vomiting with diaphoresis. Clinical concern for dissection. EXAM: CT ANGIOGRAPHY CHEST, ABDOMEN AND PELVIS TECHNIQUE: Multidetector CT imaging through the chest, abdomen and pelvis was performed using the standard  protocol during bolus administration of intravenous contrast. Multiplanar reconstructed images and MIPs were obtained and reviewed to evaluate the vascular anatomy. CONTRAST:  100 cc Isovue 370 IV COMPARISON:  Chest radiograph earlier this day. FINDINGS: CTA CHEST FINDINGS Normal caliber thoracic aorta without aneurysm, dissection, hematoma or acute aortic abnormality. There is a conventional branching pattern from the aortic arch. No significant atherosclerosis. No filling defects in the central pulmonary arteries to suggest pulmonary embolus. Heart is normal in size. Left hilar calcification, sequela of prior granulomatous disease. No pericardial effusion. No adenopathy. Minimal dependent atelectasis in both lower lobes. 3 mm pleural based nodule in the right lower lobe series 5, image 35 is likely a calcified granuloma. 4 mm left upper lobe granuloma, series 5, image 15. No pleural effusion. Mild distal esophageal thickening. Included thyroid gland is normal. There are no acute or suspicious osseous abnormalities. Mild degenerative disc disease in the thoracic spine. Review of the MIP images confirms the above findings. CTA ABDOMEN AND PELVIS FINDINGS Normal caliber abdominal aorta without dissection or aneurysm. No significant atherosclerosis. Celiac, superior mesenteric, and inferior mesenteric arteries are widely patent. Single bilateral renal arteries are patent, early branching of the left renal artery. Bilateral common and external iliac arteries are normal. Extensive peripancreatic fat stranding extending from the head, through the body and tail. There is adjacent peripancreatic free fluid, no loculated fluid collection. Moderate fluid tracks in the right and left anterior perirenal space into the pericolic gutter. Fluid tracks in the left upper quadrant about the  medial spleen. Decreased density of the liver consistent with steatosis. Mild gallbladder distention, no calcified stone. Arterial phase imaging  of the spleen and adrenal glands is normal. Symmetric renal enhancement, no hydronephrosis. Stomach distended with ingested contents. Mild bowel wall thickening of small bowel loops in the left upper quadrant, likely reactive secondary to pancreatic inflammation. No small bowel distention. The appendix is normal. Small volume colonic stool. Minimal diverticulosis of sigmoid colon, no diverticulitis. No free air. No evidence of retroperitoneal or mesenteric adenopathy. In the pelvis the bladder is physiologically distended. Prostate gland normal in size. Fat within both inguinal canals. Scattered degenerative change throughout the lumbar spine with degenerative disc disease at L5-S1 and facet arthropathy most prominent at L3-L4. Review of the MIP images confirms the above findings. IMPRESSION: 1. Normal caliber thoracoabdominal aorta without dissection or acute aortic abnormality. 2. Marked peripancreatic inflammation consistent with pancreatitis. Peripancreatic fluid tracking in the anterior para renal space and both pericolic gutters. 3. Mild gallbladder distention, no calcified stone. Hepatic steatosis. 4. Sequela of granulomatous disease in the chest. Electronically Signed   By: Rubye OaksMelanie  Ehinger M.D.   On: 12/22/2015 01:24   [place] Abdomen Limited Ruq  12/22/2015  CLINICAL DATA:  Initial evaluation for acute chest pain, epigastric pressure, nausea, vomiting. EXAM: US ABDOMEN LIMITED - RIGHT UPPER QUADRANT COMPARISON:  None. FINDINGS: Gallbladder: Multiple echogenic stones present within the gallbladder lumen, largest of which measured 2.4 cm. Small amount of sludge present as well. Gallbladder wall measured within normal limits at 3 mm. No free pericholecystic fluid. No sonographic Murphy sign elicited on exam. Probable focal fatty sparing noted within the liver adjacent to the gallbladder fossa. Common bile duct: Diameter: 4 mm Liver: Diffusely increased echogenicity, suggestive of steatosis. No focal lesions.  IMPRESSION: 1. Cholelithiasis without sonographic evidence for acute cholecystitis or biliary dilatation. 2. Hepatic steatosis. Electronically Signed   By: Rise MuBenjamin  McClintock M.D.   On: 12/22/2015 04:04    Anti-infectives: Anti-infectives    Start     Dose/Rate Route Frequency Ordered Stop   12/23/15 0800  imipenem-cilastatin (PRIMAXIN) 500 mg in sodium chloride 0.9 % 100 mL IVPB     500 mg 200 mL/hr over 30 Minutes Intravenous Every 8 hours 12/23/15 0705        Assessment/Plan:  Pancreatitis suspected from gallstones  Worsening inflammatory response. Needs continued bowel rest and IV rehydration  LOS: 1 day    Marlette Curvin A 12/23/2015

## 2015-12-23 NOTE — Progress Notes (Signed)
CRITICAL VALUE ALERT  Critical value received:  Lactic acid 4.2  Date of notification:  12/23/15  Time of notification:  0539  Critical value read back :yes  Nurse who received alert: Kizzie BaneAmy Jaylynne Birkhead, RN  MD notified (1st page): Elige KoKatherin Schorr  Time of first page:  [phone]  Responding MD:  Elige KoKatherin Schorr  Time MD responded:  [phone]

## 2015-12-23 NOTE — Progress Notes (Signed)
[facility]                                                                              Patient Demographics  Timothy [name], is a 40 y.o. male, DOB - [birth year], ZOX:[id number]  Admit date - 12/21/2015   Admitting Physician Michael Litter, MD  Outpatient Primary MD for the patient is Clayborn Heron, MD  Outpatient specialists:   LOS - 1  days    Chief Complaint  Patient presents with  . Chest Pain  . Abdominal Pain       Brief summary   40 year old male with history of hypertension, presented to the emergency department with complaints of epigastric pain, nausea and vomiting. Patient admitted for sepsis/acute pancreatitis abnormal LFTs. Right upper quadrant ultrasound showed cholelithiasis without evidence of acute cholecystitis or biliary dilatation. CTA chest, abdomen and pelvis, showed. Pancreatic inflammation consistent with pancreatitis, per pancreatic fluid tracking in the anterior pararenal space and both pericolic gutters, mild gallbladder distention no calcified stone. Surgery consulted for possible laparoscopic cholecystectomy.  Assessment & Plan    Principal Problem:   Acute pancreatitis:With worsening inflammatory response, leukocytosis, worsening creatinine, lipase, worsening lactic acid -  Continue aggressive IV fluid hydration, increase normal saline to 200 mL an hour with a 1 L bolus - NPO, bowel rest  - Placed on IV imipenem  - Gen. surgery following, if no significant improvement, repeat CT abdomen  Active Problems:   Hypocalcemia - Likely due to #1, follow closely    AKI (acute kidney injury) (HCC) with lactic acidosis - Worsening due to #1, increase IV fluid hydration -BMET every 4 hours, If no significant improvement, check renal ultrasound    Accelerated hypertension - BP currently stable, continue hydralazine as needed, labetalol    Leukocytosis - Likely due to #1, blood cultures negative so far, continue imipenem   Hyperglycemia - Hemoglobin A1c 5.7 - Continue insulin drip, IV fluid hydration    Obesity - Diet and weight control    Hypokalemia - Replaced  Code Status:Full CODE STATUS   DVT Prophylaxis:  Lovenox   Family Communication: Discussed in detail with the patient, all imaging results, lab results explained to the patient  Disposition Plan: Monitor in stepdown unit   Time Spent in minutes  25 minutes  Procedures:  Ultrasound abdomen CT  chest/abdomen  Consultants:   Gen. surgery  Antimicrobials:  Imipenem 5/24>>  Medications  Scheduled Meds: . enoxaparin (LOVENOX) injection  40 mg Subcutaneous Q24H  . imipenem-cilastatin  500 mg Intravenous Q8H  . labetalol  20 mg Intravenous Q4H  . nitroGLYCERIN  1 inch Topical Q6H   Continuous Infusions: . sodium chloride 200 mL/hr at 12/23/15 1005  . insulin (NOVOLIN-R) infusion 5.8 Units/hr (12/23/15 1014)   PRN Meds:.hydrALAZINE, HYDROmorphone (DILAUDID) injection, ondansetron **OR** ondansetron (ZOFRAN) IV   Antibiotics   Anti-infectives    Start     Dose/Rate Route Frequency Ordered Stop   12/23/15 0800  imipenem-cilastatin (PRIMAXIN) 500 mg in sodium chloride 0.9 % 100 mL IVPB     500 mg 200 mL/hr over 30 Minutes Intravenous Every 8 hours 12/23/15 0705  Subjective:   [name] was seen and examined today.  Abdominal pain, 6/10, sharp and intermittent. No fevers or chills. Denies any chest pain or shortness of breath.  nausea but no vomiting. Denies any new weakness, numbess, tingling. No acute events overnight.    Objective:   Filed Vitals:   12/22/15 1513 12/23/15 0017 12/23/15 0328 12/23/15 0746  BP:  135/73 116/61 123/85  Pulse:  94 107 103  Temp:  99.1 F (37.3 C) 98.7 F (37.1 C) 98.3 F (36.8 C)  TempSrc:  Oral Oral Oral  Resp:  34 38 41  Height: 5\' 11"  (1.803 m)     Weight: 128.5 kg (283 lb 4.7 oz)     SpO2:  93% 93% 93%    Intake/Output Summary (Last 24 hours) at 12/23/15  1100 Last data filed at 12/23/15 0700  Gross per 24 hour  Intake 4220.84 ml  Output      0 ml  Net 4220.84 ml     Wt Readings from Last 3 Encounters:  12/22/15 128.5 kg (283 lb 4.7 oz)     Exam  General: Alert and oriented x 3, NAD  HEENT:  PERRLA, EOMI, Anicteric Sclera, mucous membranes moist.   Neck: Supple, no JVD, no masses  Cardiovascular: S1 S2 auscultated, no rubs, murmurs or gallops. Regular rate and rhythm.  Respiratory: Clear to auscultation bilaterally, no wheezing, rales or rhonchi  Gastrointestinal: Soft, Moderately tender in the epigastric region, + bowel sounds  Ext: no cyanosis clubbing or edema  Neuro: AAOx3, Cr N's II- XII. Strength 5/5 upper and lower extremities bilaterally  Skin: No rashes  Psych: Normal affect and demeanor, alert and oriented x3    Data Reviewed:  I have personally reviewed following labs and imaging studies  Micro Results Recent Results (from the past 240 hour(s))  Blood culture (routine x 2)     Status: None (Preliminary result)   Collection Time: 12/22/15 12:20 AM  Result Value Ref Range Status   Specimen Description BLOOD RIGHT ANTECUBITAL  Final   Special Requests BOTTLES DRAWN AEROBIC AND ANAEROBIC 5CC  Final   Culture PENDING  Incomplete   Report Status PENDING  Incomplete  MRSA PCR Screening     Status: None   Collection Time: 12/22/15  4:00 PM  Result Value Ref Range Status   MRSA by PCR NEGATIVE NEGATIVE Final    Comment:        The GeneXpert MRSA Assay (FDA approved for NASAL specimens only), is one component of a comprehensive MRSA colonization surveillance program. It is not intended to diagnose MRSA infection nor to guide or monitor treatment for MRSA infections.     Radiology Reports Dg Chest Portable 1 View  12/21/2015  CLINICAL DATA:  Substernal pain epigastric pain beginning tonight. EXAM: PORTABLE CHEST 1 VIEW COMPARISON:  None. FINDINGS: Lungs are adequately inflated without consolidation or  effusion. Cardiomediastinal silhouette is within normal. Bones and soft tissues are normal. IMPRESSION: No active disease. Electronically Signed   By: Elberta Fortisaniel  Boyle M.D.   On: 12/21/2015 23:49   Ct Angio Chest/abd/pel For Dissection W And/or W/wo  12/22/2015  CLINICAL DATA:  Chest pressure and epigastric pain for 1 day. Nausea and vomiting with diaphoresis. Clinical concern for dissection. EXAM: CT ANGIOGRAPHY CHEST, ABDOMEN AND PELVIS TECHNIQUE: Multidetector CT imaging through the chest, abdomen and pelvis was performed using the standard protocol during bolus administration of intravenous contrast. Multiplanar reconstructed images and MIPs were obtained and reviewed to evaluate the vascular anatomy.  CONTRAST:  100 cc Isovue 370 IV COMPARISON:  Chest radiograph earlier this day. FINDINGS: CTA CHEST FINDINGS Normal caliber thoracic aorta without aneurysm, dissection, hematoma or acute aortic abnormality. There is a conventional branching pattern from the aortic arch. No significant atherosclerosis. No filling defects in the central pulmonary arteries to suggest pulmonary embolus. Heart is normal in size. Left hilar calcification, sequela of prior granulomatous disease. No pericardial effusion. No adenopathy. Minimal dependent atelectasis in both lower lobes. 3 mm pleural based nodule in the right lower lobe series 5, image 35 is likely a calcified granuloma. 4 mm left upper lobe granuloma, series 5, image 15. No pleural effusion. Mild distal esophageal thickening. Included thyroid gland is normal. There are no acute or suspicious osseous abnormalities. Mild degenerative disc disease in the thoracic spine. Review of the MIP images confirms the above findings. CTA ABDOMEN AND PELVIS FINDINGS Normal caliber abdominal aorta without dissection or aneurysm. No significant atherosclerosis. Celiac, superior mesenteric, and inferior mesenteric arteries are widely patent. Single bilateral renal arteries are patent, early  branching of the left renal artery. Bilateral common and external iliac arteries are normal. Extensive peripancreatic fat stranding extending from the head, through the body and tail. There is adjacent peripancreatic free fluid, no loculated fluid collection. Moderate fluid tracks in the right and left anterior perirenal space into the pericolic gutter. Fluid tracks in the left upper quadrant about the medial spleen. Decreased density of the liver consistent with steatosis. Mild gallbladder distention, no calcified stone. Arterial phase imaging of the spleen and adrenal glands is normal. Symmetric renal enhancement, no hydronephrosis. Stomach distended with ingested contents. Mild bowel wall thickening of small bowel loops in the left upper quadrant, likely reactive secondary to pancreatic inflammation. No small bowel distention. The appendix is normal. Small volume colonic stool. Minimal diverticulosis of sigmoid colon, no diverticulitis. No free air. No evidence of retroperitoneal or mesenteric adenopathy. In the pelvis the bladder is physiologically distended. Prostate gland normal in size. Fat within both inguinal canals. Scattered degenerative change throughout the lumbar spine with degenerative disc disease at L5-S1 and facet arthropathy most prominent at L3-L4. Review of the MIP images confirms the above findings. IMPRESSION: 1. Normal caliber thoracoabdominal aorta without dissection or acute aortic abnormality. 2. Marked peripancreatic inflammation consistent with pancreatitis. Peripancreatic fluid tracking in the anterior para renal space and both pericolic gutters. 3. Mild gallbladder distention, no calcified stone. Hepatic steatosis. 4. Sequela of granulomatous disease in the chest. Electronically Signed   By: Rubye Oaks M.D.   On: 12/22/2015 01:24   US Abdomen Limited Ruq  12/22/2015  CLINICAL DATA:  Initial evaluation for acute chest pain, epigastric pressure, nausea, vomiting. EXAM: US  ABDOMEN LIMITED - RIGHT UPPER QUADRANT COMPARISON:  None. FINDINGS: Gallbladder: Multiple echogenic stones present within the gallbladder lumen, largest of which measured 2.4 cm. Small amount of sludge present as well. Gallbladder wall measured within normal limits at 3 mm. No free pericholecystic fluid. No sonographic Murphy sign elicited on exam. Probable focal fatty sparing noted within the liver adjacent to the gallbladder fossa. Common bile duct: Diameter: 4 mm Liver: Diffusely increased echogenicity, suggestive of steatosis. No focal lesions. IMPRESSION: 1. Cholelithiasis without sonographic evidence for acute cholecystitis or biliary dilatation. 2. Hepatic steatosis. Electronically Signed   By: Rise Mu M.D.   On: 12/22/2015 04:04    Lab Data:  CBC:  Recent Labs Lab 12/21/15 2215 12/21/15 2252 12/22/15 0500 12/23/15 0435  WBC 21.9*  --  14.9* 17.5*  NEUTROABS  17.6*  --   --   --   HGB 17.1* 17.7* 17.2* 14.7  HCT 47.7 52.0 49.2 44.7  MCV 86.1  --  85.9 90.9  PLT 354  --  334 274   Basic Metabolic Panel:  Recent Labs Lab 12/21/15 2215 12/21/15 2252 12/22/15 0500 12/22/15 2100 12/23/15 0435  NA 141 144 140 137 139  K 3.0* 3.0* 4.7 5.6* 4.4  CL 107 105 108 110 112*  CO2 20*  --  19* 16* 16*  GLUCOSE 238* 235* 293* 550* 406*  BUN 18 21* 16 38* 51*  CREATININE 1.60* 1.40* 1.19 2.26* 3.12*  CALCIUM 5.4*  --  8.6* 7.7* 7.6*   GFR: Estimated Creatinine Clearance: 42.6 mL/min (by C-G formula based on Cr of 3.12). Liver Function Tests:  Recent Labs Lab 12/21/15 2215 12/22/15 0500 12/23/15 0435  AST 218* 183* 67*  ALT 227* 322* 156*  ALKPHOS 58 60 48  BILITOT 1.7* 1.3* 1.2  PROT 6.5 6.8 5.7*  ALBUMIN 4.3 4.0 3.2*    Recent Labs Lab 12/21/15 2215 12/22/15 0500 12/23/15 0435  LIPASE >3000* 891* 1674*   No results for input(s): AMMONIA in the last 168 hours. Coagulation Profile:  Recent Labs Lab 12/21/15 2215  INR 1.14   Cardiac  Enzymes:  Recent Labs Lab 12/21/15 2215  TROPONINI <0.03   BNP (last 3 results) No results for input(s): PROBNP in the last 8760 hours. HbA1C:  Recent Labs  12/22/15 0500  HGBA1C 5.7*   CBG:  Recent Labs Lab 12/23/15 0551 12/23/15 0648 12/23/15 0758 12/23/15 0913 12/23/15 1011  GLUCAP 349* 273* 198* 187* 156*   Lipid Profile:  Recent Labs  12/22/15 0500  CHOL 187  HDL 41  LDLCALC 133*  TRIG 63  CHOLHDL 4.6   Thyroid Function Tests: No results for input(s): TSH, T4TOTAL, FREET4, T3FREE, THYROIDAB in the last 72 hours. Anemia Panel: No results for input(s): VITAMINB12, FOLATE, FERRITIN, TIBC, IRON, RETICCTPCT in the last 72 hours. Urine analysis: No results found for: COLORURINE, APPEARANCEUR, LABSPEC, PHURINE, GLUCOSEU, HGBUR, BILIRUBINUR, KETONESUR, PROTEINUR, UROBILINOGEN, NITRITE, Hurshel Party M.D. [facility] 12/23/2015, 11:00 AM  Pager: [phone] Between 7am to 7pm - call Pager - [phone]  After 7pm go to [email or web address] - password [email or web address]  Call night coverage person covering after 7pm

## 2015-12-23 NOTE — Progress Notes (Signed)
Inpatient Diabetes Program Recommendations  AACE/ADA: New Consensus Statement on Inpatient Glycemic Control (2015)  Target Ranges:  Prepandial:   less than 140 mg/dL      Peak postprandial:   less than 180 mg/dL (1-2 hours)      Critically ill patients:  140 - 180 mg/dL   Review of Glycemic Control  Diabetes history: no hx  A1c 5.7  Current orders for Inpatient glycemic control: IV insulin drip per DKA order set  Inpatient Diabetes Program Recommendations:  Noted patient's hx. Will follow.  Thank you, Timothy FischerJudy E. Verl Kitson, RN, MSN, CDE Inpatient Glycemic Control Team Team Pager [phone] (8am-5pm) 12/23/2015 9:39 AM

## 2015-12-23 NOTE — Progress Notes (Signed)
CRITICAL VALUE ALERT  Critical value received:  Lactic acid of 2.6  Date of notification:  12/23/15  Time of notification:  08:10  Critical value read back:Yes.    Nurse who received alert:  Troyce  MD notified (1st page):  Dr. Isidoro Donningai  Time of first page: 08:11  MD notified (2nd page):  Time of second page:  Responding MD:    Time MD responded:

## 2015-12-24 ENCOUNTER — Inpatient Hospital Stay (HOSPITAL_COMMUNITY): Payer: Federal, State, Local not specified - PPO

## 2015-12-24 DIAGNOSIS — E872 Acidosis: Secondary | ICD-10-CM

## 2015-12-24 DIAGNOSIS — K859 Acute pancreatitis without necrosis or infection, unspecified: Secondary | ICD-10-CM | POA: Insufficient documentation

## 2015-12-24 DIAGNOSIS — I10 Essential (primary) hypertension: Secondary | ICD-10-CM

## 2015-12-24 DIAGNOSIS — J9601 Acute respiratory failure with hypoxia: Secondary | ICD-10-CM | POA: Insufficient documentation

## 2015-12-24 DIAGNOSIS — J9600 Acute respiratory failure, unspecified whether with hypoxia or hypercapnia: Secondary | ICD-10-CM | POA: Insufficient documentation

## 2015-12-24 LAB — GLUCOSE, CAPILLARY
Glucose-Capillary: 106 mg/dL — ABNORMAL HIGH (ref 65–99)
Glucose-Capillary: 126 mg/dL — ABNORMAL HIGH (ref 65–99)
Glucose-Capillary: 137 mg/dL — ABNORMAL HIGH (ref 65–99)
Glucose-Capillary: 146 mg/dL — ABNORMAL HIGH (ref 65–99)
Glucose-Capillary: 150 mg/dL — ABNORMAL HIGH (ref 65–99)
Glucose-Capillary: 153 mg/dL — ABNORMAL HIGH (ref 65–99)
Glucose-Capillary: 153 mg/dL — ABNORMAL HIGH (ref 65–99)
Glucose-Capillary: 155 mg/dL — ABNORMAL HIGH (ref 65–99)
Glucose-Capillary: 159 mg/dL — ABNORMAL HIGH (ref 65–99)
Glucose-Capillary: 161 mg/dL — ABNORMAL HIGH (ref 65–99)
Glucose-Capillary: 166 mg/dL — ABNORMAL HIGH (ref 65–99)
Glucose-Capillary: 166 mg/dL — ABNORMAL HIGH (ref 65–99)
Glucose-Capillary: 167 mg/dL — ABNORMAL HIGH (ref 65–99)
Glucose-Capillary: 169 mg/dL — ABNORMAL HIGH (ref 65–99)
Glucose-Capillary: 172 mg/dL — ABNORMAL HIGH (ref 65–99)
Glucose-Capillary: 177 mg/dL — ABNORMAL HIGH (ref 65–99)
Glucose-Capillary: 184 mg/dL — ABNORMAL HIGH (ref 65–99)
Glucose-Capillary: 208 mg/dL — ABNORMAL HIGH (ref 65–99)
Glucose-Capillary: 216 mg/dL — ABNORMAL HIGH (ref 65–99)
Glucose-Capillary: 216 mg/dL — ABNORMAL HIGH (ref 65–99)
Glucose-Capillary: 237 mg/dL — ABNORMAL HIGH (ref 65–99)

## 2015-12-24 LAB — COMPREHENSIVE METABOLIC PANEL
ALT: 89 U/L — ABNORMAL HIGH (ref 17–63)
AST: 71 U/L — ABNORMAL HIGH (ref 15–41)
Albumin: 2.8 g/dL — ABNORMAL LOW (ref 3.5–5.0)
Alkaline Phosphatase: 54 U/L (ref 38–126)
Anion gap: 6 (ref 5–15)
BUN: 46 mg/dL — ABNORMAL HIGH (ref 6–20)
CO2: 21 mmol/L — ABNORMAL LOW (ref 22–32)
Calcium: 6.9 mg/dL — ABNORMAL LOW (ref 8.9–10.3)
Chloride: 121 mmol/L — ABNORMAL HIGH (ref 101–111)
Creatinine, Ser: 1.54 mg/dL — ABNORMAL HIGH (ref 0.61–1.24)
GFR calc Af Amer: >60 mL/min (ref >60)
GFR calc non Af Amer: 54 mL/min — ABNORMAL LOW (ref >60)
Glucose, Bld: 130 mg/dL — ABNORMAL HIGH (ref 65–99)
Potassium: 4.4 mmol/L (ref 3.5–5.1)
Sodium: 148 mmol/L — ABNORMAL HIGH (ref 135–145)
Total Bilirubin: 0.9 mg/dL (ref 0.3–1.2)
Total Protein: 5.5 g/dL — ABNORMAL LOW (ref 6.5–8.1)

## 2015-12-24 LAB — BLOOD GAS, ARTERIAL
Acid-base deficit: 7.8 mmol/L — ABNORMAL HIGH (ref 0.0–2.0)
Acid-base deficit: 8.3 mmol/L — ABNORMAL HIGH (ref 0.0–2.0)
Bicarbonate: 16.2 mEq/L — ABNORMAL LOW (ref 20.0–24.0)
Bicarbonate: 16.1 mEq/L — ABNORMAL LOW (ref 20.0–24.0)
Drawn by: 365291
Drawn by: 270221
FIO2: 1
MECHVT: 600 mL
O2 Content: 2 L/min
O2 Saturation: 95.7 %
O2 Saturation: 99.4 %
PEEP: 5 cmH2O
Patient temperature: 98.6
Patient temperature: 99
RATE: 30 resp/min
TCO2: 17.1 mmol/L (ref 0–100)
TCO2: 17 mmol/L (ref 0–100)
pCO2 arterial: 27.5 mmHg — ABNORMAL LOW (ref 35.0–45.0)
pCO2 arterial: 29.4 mmHg — ABNORMAL LOW (ref 35.0–45.0)
pH, Arterial: 7.389 (ref 7.350–7.450)
pH, Arterial: 7.358 (ref 7.350–7.450)
pO2, Arterial: 77.1 mmHg — ABNORMAL LOW (ref 80.0–100.0)
pO2, Arterial: 212 mmHg — ABNORMAL HIGH (ref 80.0–100.0)

## 2015-12-24 LAB — BASIC METABOLIC PANEL
Anion gap: 8 (ref 5–15)
BUN: 40 mg/dL — ABNORMAL HIGH (ref 6–20)
CO2: 18 mmol/L — ABNORMAL LOW (ref 22–32)
Calcium: 6.5 mg/dL — ABNORMAL LOW (ref 8.9–10.3)
Chloride: 120 mmol/L — ABNORMAL HIGH (ref 101–111)
Creatinine, Ser: 1.31 mg/dL — ABNORMAL HIGH (ref 0.61–1.24)
GFR calc Af Amer: >60 mL/min (ref >60)
GFR calc non Af Amer: >60 mL/min (ref >60)
Glucose, Bld: 227 mg/dL — ABNORMAL HIGH (ref 65–99)
Potassium: 4 mmol/L (ref 3.5–5.1)
Sodium: 146 mmol/L — ABNORMAL HIGH (ref 135–145)

## 2015-12-24 LAB — CBC
HCT: 42.1 % (ref 39.0–52.0)
Hemoglobin: 13.8 g/dL (ref 13.0–17.0)
MCH: 30.4 pg (ref 26.0–34.0)
MCHC: 32.8 g/dL (ref 30.0–36.0)
MCV: 92.7 fL (ref 78.0–100.0)
Platelets: 195 10*3/uL (ref 150–400)
RBC: 4.54 MIL/uL (ref 4.22–5.81)
RDW: 13.8 % (ref 11.5–15.5)
WBC: 16.9 10*3/uL — ABNORMAL HIGH (ref 4.0–10.5)

## 2015-12-24 LAB — BRAIN NATRIURETIC PEPTIDE: B Natriuretic Peptide: 151.9 pg/mL — ABNORMAL HIGH (ref 0.0–100.0)

## 2015-12-24 LAB — LIPASE, BLOOD: Lipase: 885 U/L — ABNORMAL HIGH (ref 11–51)

## 2015-12-24 LAB — PROCALCITONIN: Procalcitonin: 4.16 ng/mL

## 2015-12-24 LAB — LACTIC ACID, PLASMA: Lactic Acid, Venous: 1.8 mmol/L (ref 0.5–2.0)

## 2015-12-24 MED ORDER — INSULIN ASPART 100 UNIT/ML ~~LOC~~ SOLN
2.0000 [IU] | SUBCUTANEOUS | Status: DC
  Administered 2015-12-25 (×2): 4 [IU] via SUBCUTANEOUS
  Administered 2015-12-25: 6 [IU] via SUBCUTANEOUS
  Administered 2015-12-25: 4 [IU] via SUBCUTANEOUS

## 2015-12-24 MED ORDER — ETOMIDATE 2 MG/ML IV SOLN
20.0000 mg | Freq: Once | INTRAVENOUS | Status: AC
  Administered 2015-12-24: 20 mg via INTRAVENOUS

## 2015-12-24 MED ORDER — SODIUM CHLORIDE 0.9% FLUSH
10.0000 mL | INTRAVENOUS | Status: DC | PRN
  Administered 2015-12-31 – 2016-01-01 (×2): 20 mL
  Administered 2016-01-02: 30 mL
  Administered 2016-01-03: 20 mL
  Administered 2016-01-04: 40 mL
  Filled 2015-12-24 (×5): qty 40

## 2015-12-24 MED ORDER — INSULIN GLARGINE 100 UNIT/ML ~~LOC~~ SOLN
20.0000 [IU] | SUBCUTANEOUS | Status: DC
  Administered 2015-12-24 – 2015-12-26 (×3): 20 [IU] via SUBCUTANEOUS
  Filled 2015-12-24 (×4): qty 0.2

## 2015-12-24 MED ORDER — HEPARIN SODIUM (PORCINE) 5000 UNIT/ML IJ SOLN
5000.0000 [IU] | Freq: Three times a day (TID) | INTRAMUSCULAR | Status: DC
  Administered 2015-12-24 – 2016-01-05 (×37): 5000 [IU] via SUBCUTANEOUS
  Filled 2015-12-24 (×35): qty 1

## 2015-12-24 MED ORDER — SODIUM CHLORIDE 0.9 % IV SOLN
INTRAVENOUS | Status: DC
  Administered 2015-12-24: 4.3 [IU]/h via INTRAVENOUS
  Administered 2015-12-24: 1.6 [IU]/h via INTRAVENOUS
  Administered 2015-12-24: 4.2 [IU]/h via INTRAVENOUS
  Administered 2015-12-24: 3.5 [IU]/h via INTRAVENOUS
  Filled 2015-12-24: qty 2.5

## 2015-12-24 MED ORDER — DEXTROSE-NACL 5-0.45 % IV SOLN
INTRAVENOUS | Status: DC
  Administered 2015-12-24: 05:00:00 via INTRAVENOUS

## 2015-12-24 MED ORDER — ROCURONIUM BROMIDE 50 MG/5ML IV SOLN
50.0000 mg | Freq: Once | INTRAVENOUS | Status: AC
  Administered 2015-12-24: 50 mg via INTRAVENOUS

## 2015-12-24 MED ORDER — SODIUM CHLORIDE 0.9 % IV SOLN
25.0000 ug/h | INTRAVENOUS | Status: DC
  Administered 2015-12-24: 50 ug/h via INTRAVENOUS
  Administered 2015-12-24: 250 ug/h via INTRAVENOUS
  Administered 2015-12-25: 225 ug/h via INTRAVENOUS
  Administered 2015-12-25: 250 ug/h via INTRAVENOUS
  Administered 2015-12-26: 200 ug/h via INTRAVENOUS
  Administered 2015-12-27: 250 ug/h via INTRAVENOUS
  Filled 2015-12-24 (×7): qty 50

## 2015-12-24 MED ORDER — SODIUM CHLORIDE 0.9 % IV BOLUS (SEPSIS)
1000.0000 mL | Freq: Once | INTRAVENOUS | Status: AC
  Administered 2015-12-24: 1000 mL via INTRAVENOUS

## 2015-12-24 MED ORDER — ANTISEPTIC ORAL RINSE SOLUTION (CORINZ): 7.0000 mL | Freq: Four times a day (QID) | OROMUCOSAL | Status: DC

## 2015-12-24 MED ORDER — SODIUM CHLORIDE 0.9% FLUSH
10.0000 mL | Freq: Two times a day (BID) | INTRAVENOUS | Status: DC
  Administered 2015-12-24: 10 mL
  Administered 2015-12-25: 30 mL
  Administered 2015-12-25 – 2015-12-29 (×6): 10 mL
  Administered 2015-12-29: 20 mL
  Administered 2015-12-30 (×2): 10 mL
  Administered 2015-12-31: 40 mL
  Administered 2016-01-01: 10 mL
  Administered 2016-01-02: 30 mL
  Administered 2016-01-03 – 2016-01-05 (×2): 10 mL

## 2015-12-24 MED ORDER — VANCOMYCIN HCL IN DEXTROSE 1-5 GM/200ML-% IV SOLN
1000.0000 mg | Freq: Two times a day (BID) | INTRAVENOUS | Status: DC
  Administered 2015-12-24 – 2015-12-26 (×4): 1000 mg via INTRAVENOUS
  Filled 2015-12-24 (×7): qty 200

## 2015-12-24 MED ORDER — MIDAZOLAM HCL 2 MG/2ML IJ SOLN
INTRAMUSCULAR | Status: AC
  Administered 2015-12-24: 2 mg via INTRAVENOUS
  Filled 2015-12-24: qty 4

## 2015-12-24 MED ORDER — FENTANYL BOLUS VIA INFUSION
50.0000 ug | INTRAVENOUS | Status: DC | PRN
  Administered 2015-12-24 – 2015-12-26 (×3): 50 ug via INTRAVENOUS
  Filled 2015-12-24: qty 50

## 2015-12-24 MED ORDER — FENTANYL CITRATE (PF) 100 MCG/2ML IJ SOLN
100.0000 ug | Freq: Once | INTRAMUSCULAR | Status: AC
  Administered 2015-12-24: 100 ug via INTRAVENOUS

## 2015-12-24 MED ORDER — CHLORHEXIDINE GLUCONATE 0.12% ORAL RINSE (MEDLINE KIT)
15.0000 mL | Freq: Two times a day (BID) | OROMUCOSAL | Status: DC
  Administered 2015-12-24 (×2): 15 mL via OROMUCOSAL

## 2015-12-24 MED ORDER — ANTISEPTIC ORAL RINSE SOLUTION (CORINZ)
7.0000 mL | OROMUCOSAL | Status: DC
  Administered 2015-12-25 – 2015-12-30 (×55): 7 mL via OROMUCOSAL

## 2015-12-24 MED ORDER — ACETAMINOPHEN 160 MG/5ML PO SOLN
650.0000 mg | Freq: Four times a day (QID) | ORAL | Status: DC | PRN
  Administered 2015-12-24 – 2015-12-27 (×7): 650 mg
  Filled 2015-12-24 (×9): qty 20.3

## 2015-12-24 MED ORDER — SODIUM CHLORIDE 0.45 % IV SOLN
INTRAVENOUS | Status: DC
  Administered 2015-12-24 – 2015-12-25 (×3): via INTRAVENOUS

## 2015-12-24 MED ORDER — MIDAZOLAM HCL 2 MG/2ML IJ SOLN
2.0000 mg | Freq: Once | INTRAMUSCULAR | Status: AC
  Administered 2015-12-24: 2 mg via INTRAVENOUS

## 2015-12-24 MED ORDER — CHLORHEXIDINE GLUCONATE 0.12% ORAL RINSE (MEDLINE KIT): 15.0000 mL | Freq: Two times a day (BID) | OROMUCOSAL | Status: DC

## 2015-12-24 MED ORDER — ANTISEPTIC ORAL RINSE SOLUTION (CORINZ)
7.0000 mL | Freq: Four times a day (QID) | OROMUCOSAL | Status: DC
  Administered 2015-12-24 (×2): 7 mL via OROMUCOSAL

## 2015-12-24 MED ORDER — FENTANYL CITRATE (PF) 100 MCG/2ML IJ SOLN
INTRAMUSCULAR | Status: AC
  Administered 2015-12-24: 100 ug via INTRAVENOUS
  Filled 2015-12-24: qty 4

## 2015-12-24 MED ORDER — MIDAZOLAM HCL 2 MG/2ML IJ SOLN: 2.0000 mg | INTRAMUSCULAR | Status: DC | PRN

## 2015-12-24 MED ORDER — FENTANYL CITRATE (PF) 100 MCG/2ML IJ SOLN: 50.0000 ug | Freq: Once | INTRAMUSCULAR | Status: DC

## 2015-12-24 MED ORDER — PANTOPRAZOLE SODIUM 40 MG IV SOLR
40.0000 mg | INTRAVENOUS | Status: DC
  Administered 2015-12-24 – 2015-12-29 (×6): 40 mg via INTRAVENOUS
  Filled 2015-12-24 (×6): qty 40

## 2015-12-24 MED ORDER — DEXTROSE 5 % IV SOLN
0.0000 ug/min | INTRAVENOUS | Status: DC
  Administered 2015-12-24: 60 ug/min via INTRAVENOUS
  Administered 2015-12-24: 20 ug/min via INTRAVENOUS
  Filled 2015-12-24: qty 1

## 2015-12-24 MED ORDER — FUROSEMIDE 10 MG/ML IJ SOLN
40.0000 mg | Freq: Once | INTRAMUSCULAR | Status: DC
  Filled 2015-12-24: qty 4

## 2015-12-24 MED ORDER — SODIUM CHLORIDE 0.9 % IV SOLN: INTRAVENOUS | Status: DC | PRN

## 2015-12-24 MED ORDER — LORAZEPAM 2 MG/ML IJ SOLN
1.0000 mg | Freq: Once | INTRAMUSCULAR | Status: DC
  Filled 2015-12-24: qty 1

## 2015-12-24 MED ORDER — SODIUM CHLORIDE 0.9 % IV SOLN
500.0000 mg | Freq: Four times a day (QID) | INTRAVENOUS | Status: AC
  Administered 2015-12-24 – 2016-01-04 (×46): 500 mg via INTRAVENOUS
  Filled 2015-12-24 (×50): qty 500

## 2015-12-24 MED ORDER — METOPROLOL TARTRATE 5 MG/5ML IV SOLN
5.0000 mg | Freq: Once | INTRAVENOUS | Status: DC
  Filled 2015-12-24: qty 5

## 2015-12-24 MED ORDER — DEXTROSE 5 % IV SOLN
0.0000 ug/min | INTRAVENOUS | Status: DC
  Administered 2015-12-24: 80 ug/min via INTRAVENOUS
  Administered 2015-12-25: 40 ug/min via INTRAVENOUS
  Administered 2015-12-25: 25 ug/min via INTRAVENOUS
  Administered 2015-12-25: 80 ug/min via INTRAVENOUS
  Filled 2015-12-24 (×3): qty 4

## 2015-12-24 MED ORDER — CHLORHEXIDINE GLUCONATE 0.12% ORAL RINSE (MEDLINE KIT)
15.0000 mL | Freq: Two times a day (BID) | OROMUCOSAL | Status: DC
  Administered 2015-12-25 – 2015-12-30 (×11): 15 mL via OROMUCOSAL

## 2015-12-24 NOTE — Progress Notes (Signed)
[facility]                                                                              Patient Demographics  Timothy [name], is a 40 y.o. male, DOB - [birth year], ZOX:[id number]  Admit date - 12/21/2015   Admitting Physician Michael Litter, MD  Outpatient Primary MD for the patient is Clayborn Heron, MD  Outpatient specialists:   LOS - 2  days    Chief Complaint  Patient presents with  . Chest Pain  . Abdominal Pain       Brief summary   40 year old male with history of hypertension, presented to the emergency department with complaints of epigastric pain, nausea and vomiting. Patient admitted for sepsis/acute pancreatitis abnormal LFTs. Right upper quadrant ultrasound showed cholelithiasis without evidence of acute cholecystitis or biliary dilatation. CTA chest, abdomen and pelvis, showed. Pancreatic inflammation consistent with pancreatitis, per pancreatic fluid tracking in the anterior pararenal space and both pericolic gutters, mild gallbladder distention no calcified stone. Surgery consulted for possible laparoscopic cholecystectomy.  Assessment & Plan      Acute pancreatitis:With worsening inflammatory response, leukocytosis, worsening creatinine, lipase, worsening lactic acid - Lipase improving however overall patient appears to be worse, tachycardia, acute respiratory failure with tachypnea, bibasilar crackles, somewhat confused, distended - hold IV fluids, continue IV imipenem - Need to repeat CT abdomen to rule out any necrotizing pancreatitis or abscess or perforation - Surgery following  Acute encephalopathy with acute respiratory failure   - Patient appears to be confused, tachypneic, tachycardiac, hypertensive - Hold fluids, Obtain stat ABG, stat portable chest x-ray, BNP, Lasix 40 mg IV 1 - Patient also has lost IV access, place IV access ASAP - Strict I's and O's, may need Foley catheter, CT head when able  - Consulted CCM, d/w Dr Katina Degree,  may need central line     Hypocalcemia - Likely due to #1, follow closely    AKI (acute kidney injury) (HCC) with lactic acidosis, metabolic acidosis - Cr improving, lactic acid normalized with IV fluids, hold IV fluids for now    Accelerated hypertension - continue hydralazine as needed, on scheduled labetalol, lopressor 5mg  x1    Leukocytosis - Likely due to #1, blood cultures negative so far, continue imipenem    Hyperglycemia - Hemoglobin A1c 5.7 - insulin drip     Obesity - Diet and weight control  Code Status:Full CODE STATUS   DVT Prophylaxis:  Lovenox   Family Communication: Discussed in detail with the patient, all imaging results, lab results explained to the patient  Disposition Plan:   Time Spent in minutes  45 minutes  Procedures:  Ultrasound abdomen CT  chest/abdomen  Consultants:   Gen. Surgery CCM  Antimicrobials:  Imipenem 5/24>>  Medications  Scheduled Meds: . enoxaparin (LOVENOX) injection  40 mg Subcutaneous Q24H  . furosemide  40 mg Intravenous Once  . imipenem-cilastatin  500 mg Intravenous Q8H  . labetalol  20 mg Intravenous Q4H  . LORazepam  1 mg Intravenous Once   Continuous Infusions: . insulin (NOVOLIN-R) infusion 2.8 Units/hr (12/24/15 0613)   PRN Meds:.hydrALAZINE, HYDROmorphone (DILAUDID) injection, ondansetron **OR** ondansetron (ZOFRAN)  IV   Antibiotics   Anti-infectives    Start     Dose/Rate Route Frequency Ordered Stop   12/23/15 0800  imipenem-cilastatin (PRIMAXIN) 500 mg in sodium chloride 0.9 % 100 mL IVPB     500 mg 200 mL/hr over 30 Minutes Intravenous Every 8 hours 12/23/15 0705          Subjective:   Timothy [name] was seen and examined today. More confused, abdominal distention this morning. Overnight the patient has been tachycardiac, tachypneic, hypertensive. Per nursing staff has been confused and not following commands, getting out from his bed.   Objective:   Filed Vitals:   12/24/15 0120  12/24/15 0345 12/24/15 0438 12/24/15 0618  BP: 151/94 152/98  173/114  Pulse: 104 121 116   Temp:  97.6 F (36.4 C) 97.9 F (36.6 C)   TempSrc:  Oral Oral   Resp: 45 42 41   Height:      Weight:      SpO2: 94% 93% 94%     Intake/Output Summary (Last 24 hours) at 12/24/15 0731 Last data filed at 12/24/15 1610  Gross per 24 hour  Intake   2400 ml  Output   2350 ml  Net     50 ml     Wt Readings from Last 3 Encounters:  12/22/15 128.5 kg (283 lb 4.7 oz)     Exam  General: Alert and oriented x 2 but appears confused, slow  HEENT:    Neck: JVD+  Cardiovascular: S1 S2 clear, tachycardia  Respiratory: Bibasilar crackles  Gastrointestinal: Soft, Moderately tender in the epigastric region, + bowel sounds, distended, tight  Ext: no cyanosis clubbing or edema  Neuro: no focal neurological deficits noted, moving all 4 extremities  Skin: No rashes  Psych: somewhat confused   Data Reviewed:  I have personally reviewed following labs and imaging studies  Micro Results Recent Results (from the past 240 hour(s))  Blood culture (routine x 2)     Status: None (Preliminary result)   Collection Time: 12/22/15 12:15 AM  Result Value Ref Range Status   Specimen Description BLOOD RIGHT ANTECUBITAL  Final   Special Requests BOTTLES DRAWN AEROBIC AND ANAEROBIC 5CC  Final   Culture NO GROWTH 1 DAY  Final   Report Status PENDING  Incomplete  Blood culture (routine x 2)     Status: None (Preliminary result)   Collection Time: 12/22/15 12:20 AM  Result Value Ref Range Status   Specimen Description BLOOD LEFT ANTECUBITAL  Final   Special Requests BOTTLES DRAWN AEROBIC AND ANAEROBIC 5CC  Final   Culture NO GROWTH 1 DAY  Final   Report Status PENDING  Incomplete  MRSA PCR Screening     Status: None   Collection Time: 12/22/15  4:00 PM  Result Value Ref Range Status   MRSA by PCR NEGATIVE NEGATIVE Final    Comment:        The GeneXpert MRSA Assay (FDA approved for NASAL  specimens only), is one component of a comprehensive MRSA colonization surveillance program. It is not intended to diagnose MRSA infection nor to guide or monitor treatment for MRSA infections.     Radiology Reports Dg Chest Portable 1 View  12/21/2015  CLINICAL DATA:  Substernal pain epigastric pain beginning tonight. EXAM: PORTABLE CHEST 1 VIEW COMPARISON:  None. FINDINGS: Lungs are adequately inflated without consolidation or effusion. Cardiomediastinal silhouette is within normal. Bones and soft tissues are normal. IMPRESSION: No active disease. Electronically Signed   By:  Elberta Fortis M.D.   On: 12/21/2015 23:49   Ct Angio Chest/abd/pel For Dissection W And/or W/wo  12/22/2015  CLINICAL DATA:  Chest pressure and epigastric pain for 1 day. Nausea and vomiting with diaphoresis. Clinical concern for dissection. EXAM: CT ANGIOGRAPHY CHEST, ABDOMEN AND PELVIS TECHNIQUE: Multidetector CT imaging through the chest, abdomen and pelvis was performed using the standard protocol during bolus administration of intravenous contrast. Multiplanar reconstructed images and MIPs were obtained and reviewed to evaluate the vascular anatomy. CONTRAST:  100 cc Isovue 370 IV COMPARISON:  Chest radiograph earlier this day. FINDINGS: CTA CHEST FINDINGS Normal caliber thoracic aorta without aneurysm, dissection, hematoma or acute aortic abnormality. There is a conventional branching pattern from the aortic arch. No significant atherosclerosis. No filling defects in the central pulmonary arteries to suggest pulmonary embolus. Heart is normal in size. Left hilar calcification, sequela of prior granulomatous disease. No pericardial effusion. No adenopathy. Minimal dependent atelectasis in both lower lobes. 3 mm pleural based nodule in the right lower lobe series 5, image 35 is likely a calcified granuloma. 4 mm left upper lobe granuloma, series 5, image 15. No pleural effusion. Mild distal esophageal thickening. Included  thyroid gland is normal. There are no acute or suspicious osseous abnormalities. Mild degenerative disc disease in the thoracic spine. Review of the MIP images confirms the above findings. CTA ABDOMEN AND PELVIS FINDINGS Normal caliber abdominal aorta without dissection or aneurysm. No significant atherosclerosis. Celiac, superior mesenteric, and inferior mesenteric arteries are widely patent. Single bilateral renal arteries are patent, early branching of the left renal artery. Bilateral common and external iliac arteries are normal. Extensive peripancreatic fat stranding extending from the head, through the body and tail. There is adjacent peripancreatic free fluid, no loculated fluid collection. Moderate fluid tracks in the right and left anterior perirenal space into the pericolic gutter. Fluid tracks in the left upper quadrant about the medial spleen. Decreased density of the liver consistent with steatosis. Mild gallbladder distention, no calcified stone. Arterial phase imaging of the spleen and adrenal glands is normal. Symmetric renal enhancement, no hydronephrosis. Stomach distended with ingested contents. Mild bowel wall thickening of small bowel loops in the left upper quadrant, likely reactive secondary to pancreatic inflammation. No small bowel distention. The appendix is normal. Small volume colonic stool. Minimal diverticulosis of sigmoid colon, no diverticulitis. No free air. No evidence of retroperitoneal or mesenteric adenopathy. In the pelvis the bladder is physiologically distended. Prostate gland normal in size. Fat within both inguinal canals. Scattered degenerative change throughout the lumbar spine with degenerative disc disease at L5-S1 and facet arthropathy most prominent at L3-L4. Review of the MIP images confirms the above findings. IMPRESSION: 1. Normal caliber thoracoabdominal aorta without dissection or acute aortic abnormality. 2. Marked peripancreatic inflammation consistent with  pancreatitis. Peripancreatic fluid tracking in the anterior para renal space and both pericolic gutters. 3. Mild gallbladder distention, no calcified stone. Hepatic steatosis. 4. Sequela of granulomatous disease in the chest. Electronically Signed   By: Rubye Oaks M.D.   On: 12/22/2015 01:24   US Abdomen Limited Ruq  12/22/2015  CLINICAL DATA:  Initial evaluation for acute chest pain, epigastric pressure, nausea, vomiting. EXAM: US ABDOMEN LIMITED - RIGHT UPPER QUADRANT COMPARISON:  None. FINDINGS: Gallbladder: Multiple echogenic stones present within the gallbladder lumen, largest of which measured 2.4 cm. Small amount of sludge present as well. Gallbladder wall measured within normal limits at 3 mm. No free pericholecystic fluid. No sonographic Murphy sign elicited on exam. Probable focal  fatty sparing noted within the liver adjacent to the gallbladder fossa. Common bile duct: Diameter: 4 mm Liver: Diffusely increased echogenicity, suggestive of steatosis. No focal lesions. IMPRESSION: 1. Cholelithiasis without sonographic evidence for acute cholecystitis or biliary dilatation. 2. Hepatic steatosis. Electronically Signed   By: Rise MuBenjamin  McClintock M.D.   On: 12/22/2015 04:04    Lab Data:  CBC:  Recent Labs Lab 12/21/15 2215 12/21/15 2252 12/22/15 0500 12/23/15 0435  WBC 21.9*  --  14.9* 17.5*  NEUTROABS 17.6*  --   --   --   HGB 17.1* 17.7* 17.2* 14.7  HCT 47.7 52.0 49.2 44.7  MCV 86.1  --  85.9 90.9  PLT 354  --  334 274   Basic Metabolic Panel:  Recent Labs Lab 12/23/15 0435 12/23/15 1119 12/23/15 1434 12/23/15 1953 12/24/15 0304  NA 139 143 142 142 148*  K 4.4 4.5 4.3 4.5 4.4  CL 112* 118* 119* 118* 121*  CO2 16* 20* 19* 17* 21*  GLUCOSE 406* 137* 167* 255* 130*  BUN 51* 56* 57* 56* 46*  CREATININE 3.12* 2.81* 2.40* 2.01* 1.54*  CALCIUM 7.6* 7.1* 6.9* 6.8* 6.9*   GFR: Estimated Creatinine Clearance: 86.3 mL/min (by C-G formula based on Cr of 1.54). Liver Function  Tests:  Recent Labs Lab 12/21/15 2215 12/22/15 0500 12/23/15 0435 12/24/15 0304  AST 218* 183* 67* 71*  ALT 227* 322* 156* 89*  ALKPHOS 58 60 48 54  BILITOT 1.7* 1.3* 1.2 0.9  PROT 6.5 6.8 5.7* 5.5*  ALBUMIN 4.3 4.0 3.2* 2.8*    Recent Labs Lab 12/21/15 2215 12/22/15 0500 12/23/15 0435 12/24/15 0304  LIPASE >3000* 891* 1674* 885*   No results for input(s): AMMONIA in the last 168 hours. Coagulation Profile:  Recent Labs Lab 12/21/15 2215  INR 1.14   Cardiac Enzymes:  Recent Labs Lab 12/21/15 2215  TROPONINI <0.03   BNP (last 3 results) No results for input(s): PROBNP in the last 8760 hours. HbA1C:  Recent Labs  12/22/15 0500  HGBA1C 5.7*   CBG:  Recent Labs Lab 12/24/15 0121 12/24/15 0230 12/24/15 0349 12/24/15 0500 12/24/15 0612  GLUCAP 159* 155* 106* 126* 153*   Lipid Profile:  Recent Labs  12/22/15 0500  CHOL 187  HDL 41  LDLCALC 133*  TRIG 63  CHOLHDL 4.6   Thyroid Function Tests: No results for input(s): TSH, T4TOTAL, FREET4, T3FREE, THYROIDAB in the last 72 hours. Anemia Panel: No results for input(s): VITAMINB12, FOLATE, FERRITIN, TIBC, IRON, RETICCTPCT in the last 72 hours. Urine analysis: No results found for: COLORURINE, APPEARANCEUR, LABSPEC, PHURINE, GLUCOSEU, HGBUR, BILIRUBINUR, KETONESUR, PROTEINUR, UROBILINOGEN, NITRITE, [place] PartyLEUKOCYTESUR   Dru Primeau M.D. [facility] 12/24/2015, 7:31 AM  Pager: [phone] Between 7am to 7pm - call Pager - [phone]  After 7pm go to [email or web address] - password [email or web address]  Call night coverage person covering after 7pm

## 2015-12-24 NOTE — Progress Notes (Signed)
Merdis DelayK. Schorr, NP made aware of pt blood glucose of 106. Orders given for D5 1/2NS.  NP also notified of current BP of 152/98 and HR in 120's. Merdis DelayK. Schorr, NP ok to hold 0400 dose of Labetolol per order parameters.  Will continue to monitor.

## 2015-12-24 NOTE — Procedures (Signed)
Arterial Catheter Insertion Procedure Note Timothy [name] [id number] May 03, 1976  Procedure: Insertion of Arterial Catheter  Indications: Blood pressure monitoring  Procedure Details Consent: Risks of procedure as well as the alternatives and risks of each were explained to the (patient/caregiver).  Consent for procedure obtained. Time Out: Verified patient identification, verified procedure, site/side was marked, verified correct patient position, special equipment/implants available, medications/allergies/relevent history reviewed, required imaging and test results available.  Performed  Maximum sterile technique was used including antiseptics, cap, gloves, gown, hand hygiene, mask and sheet. Skin prep: Chlorhexidine; local anesthetic administered 20 gauge catheter was inserted into right radial artery using the Seldinger technique.  Evaluation Blood flow good; BP tracing good. Complications: No apparent complications.   Timothy [name], Timothy [name] 12/24/2015

## 2015-12-24 NOTE — Progress Notes (Addendum)
Notified Dean Foods CompanyKaty Whitehart. N.P. Regarding HR 122, BP 80s/50s, Temp. 38.8 and CVP 15. Informed of Lactic acid 1.8 Neosynephrine started per Dr. Molli KnockYacoub.  Cortisol level  And Tylenol orders initated.  Will continue to monitor for further changes.

## 2015-12-24 NOTE — Progress Notes (Signed)
eLink Physician-Brief Progress Note Patient Name: Timothy [name] DOB: 11/16/1975 MRN: [id number]   Date of Service  12/24/2015  HPI/Events of Note  Intubated and ventilated. Notified of need for Stress Ulcer Prophylaxis.   eICU Interventions  Will order Protonix IV.      Intervention Category Intermediate Interventions: Best-practice therapies (e.g. DVT, beta blocker, etc.)  [name] 12/24/2015, 8:24 PM

## 2015-12-24 NOTE — Progress Notes (Signed)
Report given to Day shift RN and Verbal update given to Dr. Isidoro Donningai.

## 2015-12-24 NOTE — Progress Notes (Signed)
During shift report Night shift RN noticed pt RR 30's-40's, O2 sat 91-93, HR in 120's. This RN placed O2 2L's via [place] based on O2 saturation readings. Pt is diaphoretic, color WNLs. Day shift RN stated vital signs have been consistent throughout the day and that the providers are aware.  Pt A&Ox4, denies having any pain at this time and states that "he feels fine, just a little tired".

## 2015-12-24 NOTE — Consult Note (Signed)
PULMONARY / CRITICAL CARE MEDICINE   Name: Timothy [name] MRN: [id number] DOB: Feb 19, 1976    ADMISSION DATE:  12/21/2015 CONSULTATION DATE:  12/24/2015  REFERRING MD:  Dr. Isidoro Donningai  CHIEF COMPLAINT:  Pancreatitis and respiratory failure.  HISTORY OF PRESENT ILLNESS:   40yo male never smoker with past medical history of anxiety and HTN (recently started on lisinopril) who presented 5/23 with abd pain, nausea and vomiting.  He was admitted by [facility] with acute pancreatitis, AKI and hypertensive crisis.   Treated with IV fluids, bowel rest and followed by surgery.  On 5/25 he had worsening tachycardia, respiratory distress and PCCM consulted for ICU tx.    Currently c/o abd pain, dyspnea.  Feels tired.  Denies nausea, chest pain.    PAST MEDICAL HISTORY :  He  has a past medical history of Hypertension; Pancreatitis, acute (12/22/2015); AKI (acute kidney injury) (HCC) (12/22/2015); and Anxiety.  PAST SURGICAL HISTORY: He  has past surgical history that includes Back surgery.  No Known Allergies  No current facility-administered medications on file prior to encounter.   No current outpatient prescriptions on file prior to encounter.    FAMILY HISTORY:  His has no family status information on file.   SOCIAL HISTORY: He  reports that he has never smoked. He has never used smokeless tobacco. He reports that he drinks alcohol. He reports that he does not use illicit drugs.  REVIEW OF SYSTEMS:   As per HPI - All other systems reviewed and were neg.    SUBJECTIVE:    VITAL SIGNS: BP 158/97 mmHg  Pulse 134  Temp(Src) 99 F (37.2 C) (Axillary)  Resp 44  Ht 5\' 11"  (1.803 m)  Wt 128.5 kg (283 lb 4.7 oz)  BMI 39.53 kg/m2  SpO2 94%  HEMODYNAMICS:    VENTILATOR SETTINGS:    INTAKE / OUTPUT: I/O last 3 completed shifts: In: 4897.9 [I.V.:1597.9; IV Piggyback:3300] Out: 2350 [Urine:2350]  PHYSICAL EXAMINATION: General:  Ill appearing male Neuro:  Awake, alert, appropriate, MAE   HEENT:  Mm moist, no JVD  Cardiovascular:  s1s2 tachy Lungs: RR 40's, mild distress, diminished bases Abdomen:  Distended, tender, -bs  Musculoskeletal: cool, dry, mottled, scant peripheral edema  LABS:  BMET  Recent Labs Lab 12/23/15 1434 12/23/15 1953 12/24/15 0304  NA 142 142 148*  K 4.3 4.5 4.4  CL 119* 118* 121*  CO2 19* 17* 21*  BUN 57* 56* 46*  CREATININE 2.40* 2.01* 1.54*  GLUCOSE 167* 255* 130*    Electrolytes  Recent Labs Lab 12/23/15 1434 12/23/15 1953 12/24/15 0304  CALCIUM 6.9* 6.8* 6.9*    CBC  Recent Labs Lab 12/22/15 0500 12/23/15 0435 12/24/15 0757  WBC 14.9* 17.5* 16.9*  HGB 17.2* 14.7 13.8  HCT 49.2 44.7 42.1  PLT 334 274 195    Coag's  Recent Labs Lab 12/21/15 2215  INR 1.14    Sepsis Markers  Recent Labs Lab 12/23/15 0710 12/23/15 1434 12/23/15 1646  LATICACIDVEN 2.6* 1.5 1.4    ABG  Recent Labs Lab 12/24/15 0740  PHART 7.389  PCO2ART 27.5*  PO2ART 77.1*    Liver Enzymes  Recent Labs Lab 12/22/15 0500 12/23/15 0435 12/24/15 0304  AST 183* 67* 71*  ALT 322* 156* 89*  ALKPHOS 60 48 54  BILITOT 1.3* 1.2 0.9  ALBUMIN 4.0 3.2* 2.8*    Cardiac Enzymes  Recent Labs Lab 12/21/15 2215  TROPONINI <0.03    Glucose  Recent Labs Lab 12/24/15 0121 12/24/15 0230 12/24/15 0349 12/24/15  0500 12/24/15 0612 12/24/15 0823  GLUCAP 159* 155* 106* 126* 153* 208*    Imaging Dg Chest Port 1 View  12/24/2015  CLINICAL DATA:  40 year old male with shortness of breath EXAM: PORTABLE CHEST 1 VIEW COMPARISON:  Prior chest x-ray 12/21/2015 FINDINGS: Lower inspiratory volumes with increased bibasilar opacities favored to reflect atelectasis. Low inspiratory volumes results in pulmonary vascular crowding. Cardiac and mediastinal contours are within normal limits. Calcified granuloma in the left upper lobe, stable. No acute osseous abnormality. No large effusion, pneumothorax or edema. IMPRESSION: Lower inspiratory  volumes with increased bibasilar opacities which are favored to reflect atelectasis. Electronically Signed   By: Malachy Moan M.D.   On: 12/24/2015 08:06     STUDIES:  CTA chest/abd/pelvis 5/23>>>1. Normal caliber thoracoabdominal aorta without dissection or acute aortic abnormality.  2. Marked peripancreatic inflammation consistent with pancreatitis.  Peripancreatic fluid tracking in the anterior para renal space and both pericolic gutters.  3. Mild gallbladder distention, no calcified stone. Hepatic steatosis. 4. Sequela of granulomatous disease in the chest. abd u/s 5/23>>> 1. Cholelithiasis without sonographic evidence for acute cholecystitis or biliary dilatation. 2. Hepatic steatosis.  CULTURES: BC x 2 5/23>>>  ANTIBIOTICS: Imipenem 5/24>>> vanc 5/25>>>  SIGNIFICANT EVENTS: 5/25-- tx ICU   LINES/TUBES: ETT 5/25>>>  L IJ CVL 5/25>>>  DISCUSSION: 40yo male with hx HTN admitted 5/23 with acute pancreatitis likely r/t gallstones.  Tx ICU 5/25 for worsening SIRS, respiratory distress.    ASSESSMENT / PLAN:  PULMONARY Acute respiratory failure - r/t inflammatory response from severe pancreatitis  P:   Intubate  Vent support - 8cc/kg  F/u CXR  F/u ABG PRN BD   CARDIOVASCULAR SIRS/sepsis - r/t pancreatitis  HTN - improved  P:  Gentle volume  Trend lactate, pct  Continue scheduled labetalol -- monitor BP post intubation may need to hold  PRN hydralazine  CVL for CVPs  RENAL nonAG metabolic acidosis  Hypernatremia  AKI  P:   Gentle volume as above  F/u chem  Trend lactate  F/u ABG   GASTROINTESTINAL Acute pancreatitis - likely r/t gallstones - worsening SIRS P:   Surgery following  NPO  Cont abx as above  Needs repeat CT abd when more stable   HEMATOLOGIC No active issue  P:  F/u CBC  Change lovenox to SQ heparin    INFECTIOUS Acute pancreatitis  P:   abx as above  Trend lactate, pct   ENDOCRINE Hyperglycemia -- hgbA1c=5.7   P:    Continue insulin gtt   NEUROLOGIC Sedation needs on vent  P:   RASS goal: -1 Fentanyl gtt, PRN versed    FAMILY  - Updates:  Pt and wife updated at length at bedside prior to intubation 5/25  - Inter-disciplinary family meet or Palliative Care meeting due by:  Day 7     Timothy Dress, NP 12/24/2015  9:21 AM Pager: [phone] or [phone]  Attending Note:  40 year old morbidly obese male with gal stone history presenting with severe gal stone pancreatitis, renal failure, metabolic acidosis and respiratory failure. On exam, patient is breathing 45 times a minute and lungs are clear to auscultation. Patient will be moved to the ICU, he reports that he is getting tired maintaing current respiratory rate. Will intubate, place central line, continue aggressive IVF resuscitation. Continue abx. Maintain NPO and start TPN if ok with surgery. Wife and patient updated at length bedside.  The patient is critically ill with multiple organ systems failure and requires  high complexity decision making for assessment and support, frequent evaluation and titration of therapies, application of advanced monitoring technologies and extensive interpretation of multiple databases.   Critical Care Time devoted to patient care services described in this note is 35 Minutes. This time reflects time of care of this signee Dr Koren Bound. This critical care time does not reflect procedure time, or teaching time or supervisory time of PA/NP/Med student/Med Resident etc but could involve care discussion time.  Alyson Reedy, M.D. [facility] Pulmonary/Critical Care Medicine. Pager: [phone]. After hours pager: [phone].

## 2015-12-24 NOTE — Procedures (Signed)
Intubation Procedure Note Timothy [name] [id number] [birth year]  Procedure: Intubation Indications: Respiratory insufficiency  Procedure Details Consent: Risks of procedure as well as the alternatives and risks of each were explained to the (patient/caregiver).  Consent for procedure obtained. Time Out: Verified patient identification, verified procedure, site/side was marked, verified correct patient position, special equipment/implants available, medications/allergies/relevent history reviewed, required imaging and test results available.  Performed  Maximum sterile technique was used including gloves, hand hygiene and mask.  MAC    Evaluation Hemodynamic Status: BP stable throughout; O2 sats: stable throughout Patient's Current Condition: stable Complications: No apparent complications Patient did tolerate procedure well. Chest X-ray ordered to verify placement.  CXR: pending.   Timothy [name],Timothy [name] 12/24/2015

## 2015-12-24 NOTE — Progress Notes (Signed)
  Subjective: He looks terrible, RR about 40 with adequate sats, Tachycardic, appears to be Sinus tach on the monitor.  He says the pain is about the same.  IV is out and IV therapy is here.    Objective: Vital signs in last 24 hours: Temp:  [97.1 F (36.2 C)-98.3 F (36.8 C)] 97.9 F (36.6 C) (05/25 0438) Pulse Rate:  [98-121] 116 (05/25 0438) Resp:  [34-49] 41 (05/25 0438) BP: (123-173)/(82-114) 173/114 mmHg (05/25 0618) SpO2:  [92 %-94 %] 94 % (05/25 0438) Last BM Date: 12/21/15 2400/2350 Afebrile, Tachycardia, Hypertensive, RR 30's-40's Hypernatremia, Na 148, Creatinine is better, Glucose is better. Lipase is improving Sats 94% on 2 liter [place] CXR this AM shows new right pleural effusion CT abd/pelvis/Chest 12/21/15:  Marked peripancreatic inflammation consistent with pancreatitis.  Peripancreatic fluid tracking in the anterior para renal space and both pericolic gutters.   Mild gallbladder distention, no calcified stone. Hepatic steatosis.   Intake/Output from previous day: 05/24 0701 - 05/25 0700 In: 2400 [I.V.:100; IV Piggyback:2300] Out: 2350 [Urine:2350] Intake/Output this shift:    General appearance: alert, cooperative, moderate distress and panting and looks very ill/uncomfortable. Resp: clear to auscultation bilaterally and anterior exam, RR 35-40. GI: Large abdomen, tender mid abdomen, few hypoactive BS.   Extremities: trace edema lower legs  Lab Results:   Recent Labs  12/22/15 0500 12/23/15 0435  WBC 14.9* 17.5*  HGB 17.2* 14.7  HCT 49.2 44.7  PLT 334 274    BMET  Recent Labs  12/23/15 1953 12/24/15 0304  NA 142 148*  K 4.5 4.4  CL 118* 121*  CO2 17* 21*  GLUCOSE 255* 130*  BUN 56* 46*  CREATININE 2.01* 1.54*  CALCIUM 6.8* 6.9*   PT/INR  Recent Labs  12/21/15 2215  LABPROT 14.7  INR 1.14     Recent Labs Lab 12/21/15 2215 12/22/15 0500 12/23/15 0435 12/24/15 0304  AST 218* 183* 67* 71*  ALT 227* 322* 156* 89*  ALKPHOS 58 60  48 54  BILITOT 1.7* 1.3* 1.2 0.9  PROT 6.5 6.8 5.7* 5.5*  ALBUMIN 4.3 4.0 3.2* 2.8*     Lipase     Component Value Date/Time   LIPASE 885* 12/24/2015 0304     Studies/Results: No results found.  Medications: . enoxaparin (LOVENOX) injection  40 mg Subcutaneous Q24H  . furosemide  40 mg Intravenous Once  . imipenem-cilastatin  500 mg Intravenous Q8H  . labetalol  20 mg Intravenous Q4H  . LORazepam  1 mg Intravenous Once    Assessment/Plan Severe gallstone pancreatitis Acute renal failure - better Hyperglycemia secondary to pancreatitis on admit Mild Respiratory distress, RR in the 30-40 range all night Tachycardia Body mass index is 39.5 FEN:  IV fluids/NPO ID: Imipenem-cilastin day 2 today VTE: Lovenox    Plan:   Dr. Isidoro Donningai has called CCM to see him, he looks much worse and has been doing poorly all PM based on VS.  Labs are better.  He may go down for CT again if he becomes more stable.  New effusion on right.  We will continue to follow closely with you.    LOS: 2 days    Soriya Worster 12/24/2015 [phone]

## 2015-12-24 NOTE — Progress Notes (Signed)
At times throughout the night pt kept going from recliner to bed stating he was trying to get comfortable. RN assisted pt with trying to get comfortable when noticed pt was going from bed to chair. Pt still A&Ox4. RR up and down but has stayed consistent from Day shift. HR up and down throughout the night. Throughout the night patient states that he feels the same but slightly anxious this am. K.Schorr, NP notified of vitals increasing, restlessness, and anxiety. Orders given for Ativan.  Pt states he has never taken anything for anxiety but was ok with having Ativan.  Will Report to Day shift of increasing vital signs.

## 2015-12-24 NOTE — Progress Notes (Signed)
At time of bedside report patient found to have taken out both IVs, tachypnic, sinus tachycardia, patient had removed oxygen and BP cuff. Patient abdomen distended & bowel sounds positive all quadrants, alert oriented x3 (unsure date). Breath sounds clear, diminished at bases. Fluids stopped, tubing replaced. MD Rai made aware. New orders received for lasix, metoprolol, CXR, ABG. IV team paged for new access after failed attempts by RN. Per MD Molli KnockYacoub, hold all meds and transfer patient to ICU bed. Patient wife notified of transfer via phone. Patient transferred to 2H14 with all belongings.

## 2015-12-24 NOTE — Procedures (Signed)
Central Venous Catheter Insertion Procedure Note Carolin CoyQuinten Kinkade [id number] 1976/02/03  Procedure: Insertion of Central Venous Catheter Indications: Assessment of intravascular volume and Drug and/or fluid administration  Procedure Details Consent: Risks of procedure as well as the alternatives and risks of each were explained to the (patient/caregiver).  Consent for procedure obtained. Time Out: Verified patient identification, verified procedure, site/side was marked, verified correct patient position, special equipment/implants available, medications/allergies/relevent history reviewed, required imaging and test results available.  Performed  Maximum sterile technique was used including antiseptics, cap, gloves, gown, hand hygiene, mask and sheet. Skin prep: Chlorhexidine; local anesthetic administered A antimicrobial bonded/coated triple lumen catheter was placed in the left internal jugular vein using the Seldinger technique.  Evaluation Blood flow good Complications: No apparent complications Patient did tolerate procedure well. Chest X-ray ordered to verify placement.  CXR: pending.  Performed using ultrasound guidance.  Wire visualized in vessel under ultrasound prior to placement of catheter.   Dirk DressKaty Whiteheart, NP 12/24/2015  10:13 AM   Alyson ReedyWesam G. [name], M.D. [facility]. Pager: [phone]. After hours pager: [phone].

## 2015-12-25 ENCOUNTER — Inpatient Hospital Stay (HOSPITAL_COMMUNITY): Payer: Federal, State, Local not specified - PPO

## 2015-12-25 DIAGNOSIS — I10 Essential (primary) hypertension: Secondary | ICD-10-CM

## 2015-12-25 DIAGNOSIS — K851 Biliary acute pancreatitis without necrosis or infection: Secondary | ICD-10-CM

## 2015-12-25 DIAGNOSIS — N179 Acute kidney failure, unspecified: Secondary | ICD-10-CM

## 2015-12-25 DIAGNOSIS — J9600 Acute respiratory failure, unspecified whether with hypoxia or hypercapnia: Secondary | ICD-10-CM

## 2015-12-25 LAB — PROCALCITONIN: Procalcitonin: 3.65 ng/mL

## 2015-12-25 LAB — GLUCOSE, CAPILLARY
Glucose-Capillary: 179 mg/dL — ABNORMAL HIGH (ref 65–99)
Glucose-Capillary: 200 mg/dL — ABNORMAL HIGH (ref 65–99)
Glucose-Capillary: 238 mg/dL — ABNORMAL HIGH (ref 65–99)
Glucose-Capillary: 274 mg/dL — ABNORMAL HIGH (ref 65–99)
Glucose-Capillary: 282 mg/dL — ABNORMAL HIGH (ref 65–99)

## 2015-12-25 LAB — BLOOD GAS, ARTERIAL
Acid-base deficit: 7 mmol/L — ABNORMAL HIGH (ref 0.0–2.0)
Bicarbonate: 16.2 mEq/L — ABNORMAL LOW (ref 20.0–24.0)
Drawn by: 249101
FIO2: 0.4
MECHVT: 600 mL
O2 Saturation: 95.1 %
PEEP: 5 cmH2O
Patient temperature: 98.6
RATE: 30 resp/min
TCO2: 16.9 mmol/L (ref 0–100)
pCO2 arterial: 23.4 mmHg — ABNORMAL LOW (ref 35.0–45.0)
pH, Arterial: 7.454 — ABNORMAL HIGH (ref 7.350–7.450)
pO2, Arterial: 71.5 mmHg — ABNORMAL LOW (ref 80.0–100.0)

## 2015-12-25 LAB — CORTISOL: Cortisol, Plasma: 27.7 ug/dL

## 2015-12-25 LAB — COMPREHENSIVE METABOLIC PANEL
ALT: 47 U/L (ref 17–63)
AST: 27 U/L (ref 15–41)
Albumin: 2.4 g/dL — ABNORMAL LOW (ref 3.5–5.0)
Alkaline Phosphatase: 85 U/L (ref 38–126)
Anion gap: 6 (ref 5–15)
BUN: 39 mg/dL — ABNORMAL HIGH (ref 6–20)
CO2: 18 mmol/L — ABNORMAL LOW (ref 22–32)
Calcium: 6.2 mg/dL — CL (ref 8.9–10.3)
Chloride: 123 mmol/L — ABNORMAL HIGH (ref 101–111)
Creatinine, Ser: 1.46 mg/dL — ABNORMAL HIGH (ref 0.61–1.24)
GFR calc Af Amer: >60 mL/min (ref >60)
GFR calc non Af Amer: 58 mL/min — ABNORMAL LOW (ref >60)
Glucose, Bld: 213 mg/dL — ABNORMAL HIGH (ref 65–99)
Potassium: 3.9 mmol/L (ref 3.5–5.1)
Sodium: 147 mmol/L — ABNORMAL HIGH (ref 135–145)
Total Bilirubin: 1.3 mg/dL — ABNORMAL HIGH (ref 0.3–1.2)
Total Protein: 5 g/dL — ABNORMAL LOW (ref 6.5–8.1)

## 2015-12-25 LAB — CBC
HCT: 38.2 % — ABNORMAL LOW (ref 39.0–52.0)
Hemoglobin: 12.4 g/dL — ABNORMAL LOW (ref 13.0–17.0)
MCH: 29.7 pg (ref 26.0–34.0)
MCHC: 32.5 g/dL (ref 30.0–36.0)
MCV: 91.4 fL (ref 78.0–100.0)
Platelets: 223 10*3/uL (ref 150–400)
RBC: 4.18 MIL/uL — ABNORMAL LOW (ref 4.22–5.81)
RDW: 14.2 % (ref 11.5–15.5)
WBC: 12.9 10*3/uL — ABNORMAL HIGH (ref 4.0–10.5)

## 2015-12-25 LAB — ECHOCARDIOGRAM COMPLETE
Height: 71 in
Weight: 4532.66 oz

## 2015-12-25 LAB — LIPASE, BLOOD: Lipase: 128 U/L — ABNORMAL HIGH (ref 11–51)

## 2015-12-25 MED ORDER — PERFLUTREN LIPID MICROSPHERE
INTRAVENOUS | Status: AC
  Administered 2015-12-25: 12:00:00
  Filled 2015-12-25: qty 10

## 2015-12-25 MED ORDER — SODIUM ACETATE 2 MEQ/ML IV SOLN
INTRAVENOUS | Status: DC
  Administered 2015-12-25 – 2015-12-26 (×2): via INTRAVENOUS
  Filled 2015-12-25 (×7): qty 1000

## 2015-12-25 MED ORDER — SODIUM CHLORIDE 0.9 % IV BOLUS (SEPSIS)
500.0000 mL | Freq: Once | INTRAVENOUS | Status: AC
  Administered 2015-12-25: 10:00:00 via INTRAVENOUS

## 2015-12-25 MED ORDER — INSULIN ASPART 100 UNIT/ML ~~LOC~~ SOLN
0.0000 [IU] | SUBCUTANEOUS | Status: DC
  Administered 2015-12-25 (×2): 11 [IU] via SUBCUTANEOUS
  Administered 2015-12-26: 4 [IU] via SUBCUTANEOUS
  Administered 2015-12-26 (×2): 7 [IU] via SUBCUTANEOUS
  Administered 2015-12-26 (×2): 11 [IU] via SUBCUTANEOUS
  Administered 2015-12-26: 7 [IU] via SUBCUTANEOUS
  Administered 2015-12-27: 11 [IU] via SUBCUTANEOUS
  Administered 2015-12-27 (×2): 7 [IU] via SUBCUTANEOUS
  Administered 2015-12-27 (×2): 11 [IU] via SUBCUTANEOUS
  Administered 2015-12-27: 4 [IU] via SUBCUTANEOUS
  Administered 2015-12-27: 7 [IU] via SUBCUTANEOUS
  Administered 2015-12-28: 4 [IU] via SUBCUTANEOUS
  Administered 2015-12-28 – 2015-12-29 (×5): 7 [IU] via SUBCUTANEOUS
  Administered 2015-12-29: 11 [IU] via SUBCUTANEOUS
  Administered 2015-12-29: 7 [IU] via SUBCUTANEOUS
  Administered 2015-12-29 (×3): 4 [IU] via SUBCUTANEOUS
  Administered 2015-12-30: 7 [IU] via SUBCUTANEOUS
  Administered 2015-12-30: 4 [IU] via SUBCUTANEOUS
  Administered 2015-12-30: 20 [IU] via SUBCUTANEOUS
  Administered 2015-12-30: 4 [IU] via SUBCUTANEOUS
  Administered 2015-12-30: 11 [IU] via SUBCUTANEOUS
  Administered 2015-12-30: 15 [IU] via SUBCUTANEOUS
  Administered 2015-12-30: 4 [IU] via SUBCUTANEOUS
  Administered 2015-12-31: 11 [IU] via SUBCUTANEOUS

## 2015-12-25 NOTE — Consult Note (Signed)
PULMONARY / CRITICAL CARE MEDICINE   Name: Timothy [name] MRN: [id number] DOB: 1975/12/24    ADMISSION DATE:  12/21/2015 CONSULTATION DATE:  12/24/2015  REFERRING MD:  Dr. Isidoro Donning  CHIEF COMPLAINT:  Pancreatitis and respiratory failure.  HISTORY OF PRESENT ILLNESS:   40yo male never smoker with past medical history of anxiety and HTN (recently started on lisinopril) who presented 5/23 with abd pain, nausea and vomiting.  He was admitted by [facility] with acute pancreatitis, AKI and hypertensive crisis.   Treated with IV fluids, bowel rest and followed by surgery.  On 5/25 he had worsening tachycardia, respiratory distress and PCCM consulted for ICU tx.    Currently c/o abd pain, dyspnea.  Feels tired.  Denies nausea, chest pain.    SUBJECTIVE:  Neo remains, wide awake on vent   VITAL SIGNS: BP 136/88 mmHg  Pulse 98  Temp(Src) 101.8 F (38.8 C) (Core (Comment))  Resp 30  Ht  (1.803 m)  Wt 128.5 kg (283 lb 4.7 oz)  BMI 39.53 kg/m2  SpO2 97%  HEMODYNAMICS: CVP:  [14 mmHg-19 mmHg] 17 mmHg  VENTILATOR SETTINGS: Vent Mode:  [-] PRVC FiO2 (%):  [40 %-100 %] 40 % Set Rate:  [30 bmp] 30 bmp Vt Set:  [600 mL] 600 mL PEEP:  [5 cmH20] 5 cmH20 Plateau Pressure:  [24 cmH20-31 cmH20] 30 cmH20  INTAKE / OUTPUT: I/O last 3 completed shifts: In: 4229.8 [I.V.:3379.8; NG/GT:50; IV Piggyback:800] Out: 4015 [Urine:3915; Emesis/NG output:100]  PHYSICAL EXAMINATION: General:  Ill appearing male Neuro:  Awake, rass 0  HEENT:  Mm moist, no JVD  Cardiovascular:  s1s2 s1 s2 RRR, improved tachy Lungs: ronchi Abdomen:  Distended, tender, -bs, no r/g Musculoskeletal: edema mild  LABS:  BMET  Recent Labs Lab 12/24/15 0304 12/24/15 1149 12/25/15 0425  NA 148* 146* 147*  K 4.4 4.0 3.9  CL 121* 120* 123*  CO2 21* 18* 18*  BUN 46* 40* 39*  CREATININE 1.54* 1.31* 1.46*  GLUCOSE 130* 227* 213*    Electrolytes  Recent Labs Lab 12/24/15 0304 12/24/15 1149 12/25/15 0425  CALCIUM  6.9* 6.5* 6.2*    CBC  Recent Labs Lab 12/23/15 0435 12/24/15 0757 12/25/15 0425  WBC 17.5* 16.9* 12.9*  HGB 14.7 13.8 12.4*  HCT 44.7 42.1 38.2*  PLT 274 195 223    Coag's  Recent Labs Lab 12/21/15 2215  INR 1.14    Sepsis Markers  Recent Labs Lab 12/23/15 1434 12/23/15 1646 12/24/15 1148 12/24/15 1150 12/25/15 0425  LATICACIDVEN 1.5 1.4  --  1.8  --   PROCALCITON  --   --  4.16  --  3.65    ABG  Recent Labs Lab 12/24/15 0740 12/24/15 1040  PHART 7.389 7.358  PCO2ART 27.5* 29.4*  PO2ART 77.1* 212*    Liver Enzymes  Recent Labs Lab 12/23/15 0435 12/24/15 0304 12/25/15 0425  AST 67* 71* 27  ALT 156* 89* 47  ALKPHOS 48 54 85  BILITOT 1.2 0.9 1.3*  ALBUMIN 3.2* 2.8* 2.4*    Cardiac Enzymes  Recent Labs Lab 12/21/15 2215  TROPONINI <0.03    Glucose  Recent Labs Lab 12/24/15 2047 12/24/15 2149 12/24/15 2250 12/24/15 2352 12/25/15 0406 12/25/15 0729  GLUCAP 137* 146* 172* 169* 179* 200*    Imaging Dg Chest Port 1 View  12/24/2015  CLINICAL DATA:  Central line placement EXAM: PORTABLE CHEST 1 VIEW COMPARISON:  12/24/2015 FINDINGS: Borderline cardiomegaly. Endotracheal tube in place with tip 3.2 cm above the carina. NG  tube in place. Central mild vascular congestion and mild perihilar interstitial prominence suspicious for mild interstitial edema. Left IJ central line with tip in SVC. No pneumothorax. Mild basilar atelectasis. IMPRESSION: Borderline cardiomegaly. Endotracheal tube and NG tube in place. Left IJ central line with tip in SVC. No pneumothorax. Mild basilar atelectasis. Central mild vascular congestion and mild perihilar interstitial prominence suspicious for mild interstitial edema pre Electronically Signed   By: Natasha MeadLiviu  Pop M.D.   On: 12/24/2015 10:27     STUDIES:  CTA chest/abd/pelvis 5/23>>>1. Normal caliber thoracoabdominal aorta without dissection or acute aortic abnormality.  2. Marked peripancreatic inflammation  consistent with pancreatitis.  Peripancreatic fluid tracking in the anterior para renal space and both pericolic gutters.  3. Mild gallbladder distention, no calcified stone. Hepatic steatosis. 4. Sequela of granulomatous disease in the chest. abd u/s 5/23>>> 1. Cholelithiasis without sonographic evidence for acute cholecystitis or biliary dilatation. 2. Hepatic steatosis.  CULTURES: BC x 2 5/23>>>  ANTIBIOTICS: Imipenem 5/24>>> vanc 5/25>>>  SIGNIFICANT EVENTS: 5/25-- tx ICU, pressors  LINES/TUBES: ETT 5/25>>>  L IJ CVL 5/25>>>  DISCUSSION: 40yo male with hx HTN admitted 5/23 with acute pancreatitis likely r/t gallstones.  Tx ICU 5/25 for worsening SIRS, respiratory distress.    ASSESSMENT / PLAN:  PULMONARY Acute respiratory failure - r/t inflammatory response from severe pancreatitis  ALI? P:   ABG reviewed, keep same MV, but repeat abg as may have alkalosis now then ouwld drop MV likely for sbt today pcxr low volumes, treat abdo distention as able  CARDIOVASCULAR SIRS/sepsis - r/t pancreatitis  Shock R/o pulm htn affecting cvp P:  Neo to map goal 60 cvp 15 Get echo for pa presssures Dc labetolol Cortisol May bolus and assess response x 1  RENAL nonAG metabolic acidosis  Hypernatremia  AKI, ATN P:   Consider addition bicarb for NONAG Chem in am  Use d5w as able Get ion caclium Pos balance  GASTROINTESTINAL Acute pancreatitis - likely r/t gallstones - worsening SIRS Did he pass stone? IMproved enzymes P:   Surgery following  NPO  Cont abx as above  Needs repeat CT abd when more stable -per surgery US no dilation stone Place POST pyloric feeding tube and feed as able trickle with vital small peptid formulation  HEMATOLOGIC DVT prevention P:  F/u CBC  SQ heparin   INFECTIOUS Acute pancreatitis  P:   Maintain vanc, imi for now likely dc vanc in am  Trend lactate, pct   ENDOCRINE Hyperglycemia -- hgbA1c=5.7   P:   Continue  ssi cbg  NEUROLOGIC Sedation needs on vent  P:   RASS goal: -1 Fentanyl gtt, wua mandatory Versed prn, dc   FAMILY  - Updates:  Pt and wife updated by me  - Inter-disciplinary family meet or Palliative Care meeting due by:  Day 7    Ccm time 40 min   Mcarthur Rossettianiel J. Tyson AliasFeinstein, MD, FACP Pgr: [phone] [facility]

## 2015-12-25 NOTE — Progress Notes (Signed)
Patient ID: Timothy [name], male   DOB: 16-Apr-1976, 40 y.o.   MRN: [id number]     [address] [place], [place] [address]    Phone: [phone] FAX: [phone]     Subjective: Awake on the vent.  Appears comfortable. Nods that pain is better. t max 102.2.  sCr 1.47, good uop.   Lipase down to 128.  t bili up to 1.3. WBC down to 12.9  Objective:  Vital signs:  Filed Vitals:   12/25/15 0430 12/25/15 0500 12/25/15 0600 12/25/15 0700  BP:  113/82 125/82 136/88  Pulse: 88 92 98 102  Temp: 101.7 F (38.7 C) 102 F (38.9 C) 102.2 F (39 C) 101.8 F (38.8 C)  TempSrc:      Resp: '30 30 30 30  ' Height:      Weight:      SpO2: 96% 98% 98% 98%    Last BM Date: 12/21/15  Intake/Output   Yesterday:  05/25 0701 - 05/26 0700 In: 3929.8 [I.V.:3279.8; NG/GT:50; IV Piggyback:600] Out: 6256 [Urine:2365; Emesis/NG output:100] This shift:    I/O last 3 completed shifts: In: 4229.8 [I.V.:3379.8; NG/GT:50; IV LSLHTDSKA:768] Out: 1157 [Urine:3915; Emesis/NG output:100]    Physical Exam: General: Pt awake on the vent.   Abdomen: Soft.  Distended. Ttp ruq and llq.  No evidence of peritonitis.  No incarcerated hernias.    Problem List:   Principal Problem:   Acute pancreatitis Active Problems:   Hypocalcemia   AKI (acute kidney injury) ([facility])   Accelerated hypertension   Leukocytosis   Lactic acidosis   Hyperglycemia   Obesity   Hypokalemia   Acute respiratory failure (HCC)   Pancreatitis, acute   Acute respiratory failure with hypoxia (HCC)    Results:   Labs: Results for orders placed or performed during the hospital encounter of 12/21/15 (from the past 48 hour(s))  Glucose, capillary     Status: Abnormal   Collection Time: 12/23/15  7:58 AM  Result Value Ref Range   Glucose-Capillary 198 (H) 65 - 99 mg/dL  Glucose, capillary     Status: Abnormal   Collection Time: 12/23/15  9:13 AM  Result  Value Ref Range   Glucose-Capillary 187 (H) 65 - 99 mg/dL  Glucose, capillary     Status: Abnormal   Collection Time: 12/23/15 10:11 AM  Result Value Ref Range   Glucose-Capillary 156 (H) 65 - 99 mg/dL  Basic metabolic panel     Status: Abnormal   Collection Time: 12/23/15 11:19 AM  Result Value Ref Range   Sodium 143 135 - 145 mmol/L   Potassium 4.5 3.5 - 5.1 mmol/L   Chloride 118 (H) 101 - 111 mmol/L   CO2 20 (L) 22 - 32 mmol/L   Glucose, Bld 137 (H) 65 - 99 mg/dL   BUN 56 (H) 6 - 20 mg/dL   Creatinine, Ser 2.81 (H) 0.61 - 1.24 mg/dL   Calcium 7.1 (L) 8.9 - 10.3 mg/dL   GFR calc non Af Amer 26 (L) >60 mL/min   GFR calc Af Amer 31 (L) >60 mL/min    Comment: (NOTE) The eGFR has been calculated using the CKD EPI equation. This calculation has not been validated in all clinical situations. eGFR's persistently <60 mL/min signify possible Chronic Kidney Disease.    Anion gap 5 5 - 15  Glucose, capillary     Status: Abnormal   Collection Time: 12/23/15 11:20 AM  Result Value Ref Range   Glucose-Capillary 139 (H) 65 - 99 mg/dL  Glucose, capillary     Status: Abnormal   Collection Time: 12/23/15 12:37 PM  Result Value Ref Range   Glucose-Capillary 155 (H) 65 - 99 mg/dL  Glucose, capillary     Status: Abnormal   Collection Time: 12/23/15  1:48 PM  Result Value Ref Range   Glucose-Capillary 169 (H) 65 - 99 mg/dL  Basic metabolic panel     Status: Abnormal   Collection Time: 12/23/15  2:34 PM  Result Value Ref Range   Sodium 142 135 - 145 mmol/L   Potassium 4.3 3.5 - 5.1 mmol/L   Chloride 119 (H) 101 - 111 mmol/L   CO2 19 (L) 22 - 32 mmol/L   Glucose, Bld 167 (H) 65 - 99 mg/dL   BUN 57 (H) 6 - 20 mg/dL   Creatinine, Ser 2.40 (H) 0.61 - 1.24 mg/dL   Calcium 6.9 (L) 8.9 - 10.3 mg/dL   GFR calc non Af Amer 32 (L) >60 mL/min   GFR calc Af Amer 37 (L) >60 mL/min    Comment: (NOTE) The eGFR has been calculated using the CKD EPI equation. This calculation has not been validated in  all clinical situations. eGFR's persistently <60 mL/min signify possible Chronic Kidney Disease.    Anion gap 4 (L) 5 - 15  Lactic acid, plasma     Status: None   Collection Time: 12/23/15  2:34 PM  Result Value Ref Range   Lactic Acid, Venous 1.5 0.5 - 2.0 mmol/L  Glucose, capillary     Status: Abnormal   Collection Time: 12/23/15  2:55 PM  Result Value Ref Range   Glucose-Capillary 161 (H) 65 - 99 mg/dL  Glucose, capillary     Status: Abnormal   Collection Time: 12/23/15  3:57 PM  Result Value Ref Range   Glucose-Capillary 153 (H) 65 - 99 mg/dL  Lactic acid, plasma     Status: None   Collection Time: 12/23/15  4:46 PM  Result Value Ref Range   Lactic Acid, Venous 1.4 0.5 - 2.0 mmol/L  Glucose, capillary     Status: Abnormal   Collection Time: 12/23/15  5:15 PM  Result Value Ref Range   Glucose-Capillary 166 (H) 65 - 99 mg/dL  Glucose, capillary     Status: Abnormal   Collection Time: 12/23/15  6:24 PM  Result Value Ref Range   Glucose-Capillary 216 (H) 65 - 99 mg/dL  Glucose, capillary     Status: Abnormal   Collection Time: 12/23/15  7:41 PM  Result Value Ref Range   Glucose-Capillary 234 (H) 65 - 99 mg/dL  Basic metabolic panel     Status: Abnormal   Collection Time: 12/23/15  7:53 PM  Result Value Ref Range   Sodium 142 135 - 145 mmol/L   Potassium 4.5 3.5 - 5.1 mmol/L   Chloride 118 (H) 101 - 111 mmol/L   CO2 17 (L) 22 - 32 mmol/L   Glucose, Bld 255 (H) 65 - 99 mg/dL   BUN 56 (H) 6 - 20 mg/dL   Creatinine, Ser 2.01 (H) 0.61 - 1.24 mg/dL   Calcium 6.8 (L) 8.9 - 10.3 mg/dL   GFR calc non Af Amer 39 (L) >60 mL/min   GFR calc Af Amer 46 (L) >60 mL/min    Comment: (NOTE) The eGFR has been calculated using the CKD EPI equation. This calculation has not been validated in all clinical situations. eGFR's persistently <60  mL/min signify possible Chronic Kidney Disease.    Anion gap 7 5 - 15  Glucose, capillary     Status: Abnormal   Collection Time: 12/23/15  8:48 PM   Result Value Ref Range   Glucose-Capillary 260 (H) 65 - 99 mg/dL  Glucose, capillary     Status: Abnormal   Collection Time: 12/23/15  9:59 PM  Result Value Ref Range   Glucose-Capillary 244 (H) 65 - 99 mg/dL  Glucose, capillary     Status: Abnormal   Collection Time: 12/23/15 11:03 PM  Result Value Ref Range   Glucose-Capillary 217 (H) 65 - 99 mg/dL  Glucose, capillary     Status: Abnormal   Collection Time: 12/24/15 12:11 AM  Result Value Ref Range   Glucose-Capillary 166 (H) 65 - 99 mg/dL  Glucose, capillary     Status: Abnormal   Collection Time: 12/24/15  1:21 AM  Result Value Ref Range   Glucose-Capillary 159 (H) 65 - 99 mg/dL   Comment 1 Notify RN   Glucose, capillary     Status: Abnormal   Collection Time: 12/24/15  2:30 AM  Result Value Ref Range   Glucose-Capillary 155 (H) 65 - 99 mg/dL  Comprehensive metabolic panel     Status: Abnormal   Collection Time: 12/24/15  3:04 AM  Result Value Ref Range   Sodium 148 (H) 135 - 145 mmol/L   Potassium 4.4 3.5 - 5.1 mmol/L   Chloride 121 (H) 101 - 111 mmol/L   CO2 21 (L) 22 - 32 mmol/L   Glucose, Bld 130 (H) 65 - 99 mg/dL   BUN 46 (H) 6 - 20 mg/dL   Creatinine, Ser 1.54 (H) 0.61 - 1.24 mg/dL   Calcium 6.9 (L) 8.9 - 10.3 mg/dL   Total Protein 5.5 (L) 6.5 - 8.1 g/dL   Albumin 2.8 (L) 3.5 - 5.0 g/dL   AST 71 (H) 15 - 41 U/L   ALT 89 (H) 17 - 63 U/L   Alkaline Phosphatase 54 38 - 126 U/L   Total Bilirubin 0.9 0.3 - 1.2 mg/dL   GFR calc non Af Amer 54 (L) >60 mL/min   GFR calc Af Amer >60 >60 mL/min    Comment: (NOTE) The eGFR has been calculated using the CKD EPI equation. This calculation has not been validated in all clinical situations. eGFR's persistently <60 mL/min signify possible Chronic Kidney Disease.    Anion gap 6 5 - 15  Lipase, blood     Status: Abnormal   Collection Time: 12/24/15  3:04 AM  Result Value Ref Range   Lipase 885 (H) 11 - 51 U/L    Comment: RESULTS CONFIRMED BY MANUAL DILUTION  Glucose,  capillary     Status: Abnormal   Collection Time: 12/24/15  3:49 AM  Result Value Ref Range   Glucose-Capillary 106 (H) 65 - 99 mg/dL  Glucose, capillary     Status: Abnormal   Collection Time: 12/24/15  5:00 AM  Result Value Ref Range   Glucose-Capillary 126 (H) 65 - 99 mg/dL  Glucose, capillary     Status: Abnormal   Collection Time: 12/24/15  6:12 AM  Result Value Ref Range   Glucose-Capillary 153 (H) 65 - 99 mg/dL  Blood gas, arterial     Status: Abnormal   Collection Time: 12/24/15  7:40 AM  Result Value Ref Range   O2 Content 2.0 L/min   Delivery systems NASAL CANNULA    pH, Arterial 7.389 7.350 - 7.450  pCO2 arterial 27.5 (L) 35.0 - 45.0 mmHg   pO2, Arterial 77.1 (L) 80.0 - 100.0 mmHg   Bicarbonate 16.2 (L) 20.0 - 24.0 mEq/L   TCO2 17.1 0 - 100 mmol/L   Acid-base deficit 7.8 (H) 0.0 - 2.0 mmol/L   O2 Saturation 95.7 %   Patient temperature 98.6    Collection site LEFT RADIAL    Drawn by [id number]    Sample type ARTERIAL DRAW    Allens test (pass/fail) PASS PASS  Brain natriuretic peptide     Status: Abnormal   Collection Time: 12/24/15  7:57 AM  Result Value Ref Range   B Natriuretic Peptide 151.9 (H) 0.0 - 100.0 pg/mL  CBC     Status: Abnormal   Collection Time: 12/24/15  7:57 AM  Result Value Ref Range   WBC 16.9 (H) 4.0 - 10.5 K/uL   RBC 4.54 4.22 - 5.81 MIL/uL   Hemoglobin 13.8 13.0 - 17.0 g/dL   HCT 42.1 39.0 - 52.0 %   MCV 92.7 78.0 - 100.0 fL   MCH 30.4 26.0 - 34.0 pg   MCHC 32.8 30.0 - 36.0 g/dL   RDW 13.8 11.5 - 15.5 %   Platelets 195 150 - 400 K/uL  Glucose, capillary     Status: Abnormal   Collection Time: 12/24/15  8:23 AM  Result Value Ref Range   Glucose-Capillary 208 (H) 65 - 99 mg/dL  Glucose, capillary     Status: Abnormal   Collection Time: 12/24/15 10:35 AM  Result Value Ref Range   Glucose-Capillary 216 (H) 65 - 99 mg/dL   Comment 1 Arterial Specimen   Blood gas, arterial     Status: Abnormal   Collection Time: 12/24/15 10:40 AM  Result  Value Ref Range   FIO2 1.00    Delivery systems VENTILATOR    Mode PRESSURE REGULATED VOLUME CONTROL    VT 600 mL   LHR 30 resp/min   Peep/cpap 5.0 cm H20   pH, Arterial 7.358 7.350 - 7.450   pCO2 arterial 29.4 (L) 35.0 - 45.0 mmHg   pO2, Arterial 212 (H) 80.0 - 100.0 mmHg   Bicarbonate 16.1 (L) 20.0 - 24.0 mEq/L   TCO2 17.0 0 - 100 mmol/L   Acid-base deficit 8.3 (H) 0.0 - 2.0 mmol/L   O2 Saturation 99.4 %   Patient temperature 99.0    Collection site RIGHT RADIAL    Drawn by [id number]    Sample type ARTERIAL DRAW    Allens test (pass/fail) PASS PASS  Glucose, capillary     Status: Abnormal   Collection Time: 12/24/15 11:37 AM  Result Value Ref Range   Glucose-Capillary 216 (H) 65 - 99 mg/dL  Procalcitonin - Baseline     Status: None   Collection Time: 12/24/15 11:48 AM  Result Value Ref Range   Procalcitonin 4.16 ng/mL    Comment:        Interpretation: PCT > 2 ng/mL: Systemic infection (sepsis) is likely, unless other causes are known. (NOTE)         ICU PCT Algorithm               Non ICU PCT Algorithm    ----------------------------     ------------------------------         PCT < 0.25 ng/mL                 PCT < 0.1 ng/mL     Stopping of antibiotics  Stopping of antibiotics       strongly encouraged.               strongly encouraged.    ----------------------------     ------------------------------       PCT level decrease by               PCT < 0.25 ng/mL       >= 80% from peak PCT       OR PCT 0.25 - 0.5 ng/mL          Stopping of antibiotics                                             encouraged.     Stopping of antibiotics           encouraged.    ----------------------------     ------------------------------       PCT level decrease by              PCT >= 0.25 ng/mL       < 80% from peak PCT        AND PCT >= 0.5 ng/mL            Continuing antibiotics                                               encouraged.       Continuing antibiotics             encouraged.    ----------------------------     ------------------------------     PCT level increase compared          PCT > 0.5 ng/mL         with peak PCT AND          PCT >= 0.5 ng/mL             Escalation of antibiotics                                          strongly encouraged.      Escalation of antibiotics        strongly encouraged.   Basic metabolic panel     Status: Abnormal   Collection Time: 12/24/15 11:49 AM  Result Value Ref Range   Sodium 146 (H) 135 - 145 mmol/L   Potassium 4.0 3.5 - 5.1 mmol/L   Chloride 120 (H) 101 - 111 mmol/L   CO2 18 (L) 22 - 32 mmol/L   Glucose, Bld 227 (H) 65 - 99 mg/dL   BUN 40 (H) 6 - 20 mg/dL   Creatinine, Ser 1.31 (H) 0.61 - 1.24 mg/dL   Calcium 6.5 (L) 8.9 - 10.3 mg/dL   GFR calc non Af Amer >60 >60 mL/min   GFR calc Af Amer >60 >60 mL/min    Comment: (NOTE) The eGFR has been calculated using the CKD EPI equation. This calculation has not been validated in all clinical situations. eGFR's persistently <60 mL/min signify possible Chronic Kidney Disease.    Anion gap 8 5 - 15  Lactic acid, plasma     Status: None  Collection Time: 12/24/15 11:50 AM  Result Value Ref Range   Lactic Acid, Venous 1.8 0.5 - 2.0 mmol/L  Glucose, capillary     Status: Abnormal   Collection Time: 12/24/15 12:23 PM  Result Value Ref Range   Glucose-Capillary 237 (H) 65 - 99 mg/dL   Comment 1 Capillary Specimen   Glucose, capillary     Status: Abnormal   Collection Time: 12/24/15  1:41 PM  Result Value Ref Range   Glucose-Capillary 177 (H) 65 - 99 mg/dL   Comment 1 Capillary Specimen   Glucose, capillary     Status: Abnormal   Collection Time: 12/24/15  2:39 PM  Result Value Ref Range   Glucose-Capillary 184 (H) 65 - 99 mg/dL   Comment 1 Capillary Specimen   Glucose, capillary     Status: Abnormal   Collection Time: 12/24/15  3:45 PM  Result Value Ref Range   Glucose-Capillary 167 (H) 65 - 99 mg/dL   Comment 1 Capillary Specimen   Glucose,  capillary     Status: Abnormal   Collection Time: 12/24/15  4:30 PM  Result Value Ref Range   Glucose-Capillary 161 (H) 65 - 99 mg/dL   Comment 1 Capillary Specimen   Glucose, capillary     Status: Abnormal   Collection Time: 12/24/15  5:42 PM  Result Value Ref Range   Glucose-Capillary 166 (H) 65 - 99 mg/dL   Comment 1 Arterial Specimen   Glucose, capillary     Status: Abnormal   Collection Time: 12/24/15  6:37 PM  Result Value Ref Range   Glucose-Capillary 153 (H) 65 - 99 mg/dL   Comment 1 Arterial Specimen   Glucose, capillary     Status: Abnormal   Collection Time: 12/24/15  7:43 PM  Result Value Ref Range   Glucose-Capillary 150 (H) 65 - 99 mg/dL   Comment 1 Notify RN   Glucose, capillary     Status: Abnormal   Collection Time: 12/24/15  8:47 PM  Result Value Ref Range   Glucose-Capillary 137 (H) 65 - 99 mg/dL   Comment 1 Venous Specimen   Glucose, capillary     Status: Abnormal   Collection Time: 12/24/15  9:49 PM  Result Value Ref Range   Glucose-Capillary 146 (H) 65 - 99 mg/dL  Glucose, capillary     Status: Abnormal   Collection Time: 12/24/15 10:50 PM  Result Value Ref Range   Glucose-Capillary 172 (H) 65 - 99 mg/dL   Comment 1 Arterial Specimen   Glucose, capillary     Status: Abnormal   Collection Time: 12/24/15 11:52 PM  Result Value Ref Range   Glucose-Capillary 169 (H) 65 - 99 mg/dL   Comment 1 Venous Specimen   Glucose, capillary     Status: Abnormal   Collection Time: 12/25/15  4:06 AM  Result Value Ref Range   Glucose-Capillary 179 (H) 65 - 99 mg/dL   Comment 1 Arterial Specimen   Comprehensive metabolic panel     Status: Abnormal   Collection Time: 12/25/15  4:25 AM  Result Value Ref Range   Sodium 147 (H) 135 - 145 mmol/L   Potassium 3.9 3.5 - 5.1 mmol/L   Chloride 123 (H) 101 - 111 mmol/L   CO2 18 (L) 22 - 32 mmol/L   Glucose, Bld 213 (H) 65 - 99 mg/dL   BUN 39 (H) 6 - 20 mg/dL   Creatinine, Ser 1.46 (H) 0.61 - 1.24 mg/dL   Calcium 6.2 (LL)  8.9 -  10.3 mg/dL    Comment: CRITICAL RESULT CALLED TO, READ BACK BY AND VERIFIED WITH: MILLER K,RN 12/25/15 0522 WAYK    Total Protein 5.0 (L) 6.5 - 8.1 g/dL   Albumin 2.4 (L) 3.5 - 5.0 g/dL   AST 27 15 - 41 U/L   ALT 47 17 - 63 U/L   Alkaline Phosphatase 85 38 - 126 U/L   Total Bilirubin 1.3 (H) 0.3 - 1.2 mg/dL   GFR calc non Af Amer 58 (L) >60 mL/min   GFR calc Af Amer >60 >60 mL/min    Comment: (NOTE) The eGFR has been calculated using the CKD EPI equation. This calculation has not been validated in all clinical situations. eGFR's persistently <60 mL/min signify possible Chronic Kidney Disease.    Anion gap 6 5 - 15  Lipase, blood     Status: Abnormal   Collection Time: 12/25/15  4:25 AM  Result Value Ref Range   Lipase 128 (H) 11 - 51 U/L  CBC     Status: Abnormal   Collection Time: 12/25/15  4:25 AM  Result Value Ref Range   WBC 12.9 (H) 4.0 - 10.5 K/uL   RBC 4.18 (L) 4.22 - 5.81 MIL/uL   Hemoglobin 12.4 (L) 13.0 - 17.0 g/dL   HCT 38.2 (L) 39.0 - 52.0 %   MCV 91.4 78.0 - 100.0 fL   MCH 29.7 26.0 - 34.0 pg   MCHC 32.5 30.0 - 36.0 g/dL   RDW 14.2 11.5 - 15.5 %   Platelets 223 150 - 400 K/uL  Procalcitonin     Status: None   Collection Time: 12/25/15  4:25 AM  Result Value Ref Range   Procalcitonin 3.65 ng/mL    Comment:        Interpretation: PCT > 2 ng/mL: Systemic infection (sepsis) is likely, unless other causes are known. (NOTE)         ICU PCT Algorithm               Non ICU PCT Algorithm    ----------------------------     ------------------------------         PCT < 0.25 ng/mL                 PCT < 0.1 ng/mL     Stopping of antibiotics            Stopping of antibiotics       strongly encouraged.               strongly encouraged.    ----------------------------     ------------------------------       PCT level decrease by               PCT < 0.25 ng/mL       >= 80% from peak PCT       OR PCT 0.25 - 0.5 ng/mL          Stopping of antibiotics                                              encouraged.     Stopping of antibiotics           encouraged.    ----------------------------     ------------------------------       PCT level decrease by  PCT >= 0.25 ng/mL       < 80% from peak PCT        AND PCT >= 0.5 ng/mL            Continuing antibiotics                                               encouraged.       Continuing antibiotics            encouraged.    ----------------------------     ------------------------------     PCT level increase compared          PCT > 0.5 ng/mL         with peak PCT AND          PCT >= 0.5 ng/mL             Escalation of antibiotics                                          strongly encouraged.      Escalation of antibiotics        strongly encouraged.     Imaging / Studies: Dg Chest Port 1 View  12/24/2015  CLINICAL DATA:  Central line placement EXAM: PORTABLE CHEST 1 VIEW COMPARISON:  12/24/2015 FINDINGS: Borderline cardiomegaly. Endotracheal tube in place with tip 3.2 cm above the carina. NG tube in place. Central mild vascular congestion and mild perihilar interstitial prominence suspicious for mild interstitial edema. Left IJ central line with tip in SVC. No pneumothorax. Mild basilar atelectasis. IMPRESSION: Borderline cardiomegaly. Endotracheal tube and NG tube in place. Left IJ central line with tip in SVC. No pneumothorax. Mild basilar atelectasis. Central mild vascular congestion and mild perihilar interstitial prominence suspicious for mild interstitial edema pre Electronically Signed   By: Lahoma Crocker M.D.   On: 12/24/2015 10:27   Dg Chest Port 1 View  12/24/2015  CLINICAL DATA:  40 year old male with shortness of breath EXAM: PORTABLE CHEST 1 VIEW COMPARISON:  Prior chest x-ray 12/21/2015 FINDINGS: Lower inspiratory volumes with increased bibasilar opacities favored to reflect atelectasis. Low inspiratory volumes results in pulmonary vascular crowding. Cardiac and mediastinal contours  are within normal limits. Calcified granuloma in the left upper lobe, stable. No acute osseous abnormality. No large effusion, pneumothorax or edema. IMPRESSION: Lower inspiratory volumes with increased bibasilar opacities which are favored to reflect atelectasis. Electronically Signed   By: Jacqulynn Cadet M.D.   On: 12/24/2015 08:06    Medications / Allergies:  Scheduled Meds: . antiseptic oral rinse  7 mL Mouth Rinse 10 times per day  . chlorhexidine gluconate (SAGE KIT)  15 mL Mouth Rinse BID  . fentaNYL (SUBLIMAZE) injection  50 mcg Intravenous Once  . heparin subcutaneous  5,000 Units Subcutaneous Q8H  . imipenem-cilastatin  500 mg Intravenous Q6H  . insulin aspart  2-6 Units Subcutaneous Q4H  . insulin glargine  20 Units Subcutaneous Q24H  . labetalol  20 mg Intravenous Q4H  . pantoprazole (PROTONIX) IV  40 mg Intravenous Q24H  . sodium chloride flush  10-40 mL Intracatheter Q12H  . vancomycin  1,000 mg Intravenous Q12H   Continuous Infusions: . sodium chloride 100 mL/hr at 12/25/15 0054  . fentaNYL infusion INTRAVENOUS  250 mcg/hr (12/25/15 0148)  . phenylephrine (NEO-SYNEPHRINE) Adult infusion 50 mcg/min (12/25/15 0225)   PRN Meds:.Place/Maintain arterial line **AND** sodium chloride, acetaminophen (TYLENOL) oral liquid 160 mg/5 mL, fentaNYL, hydrALAZINE, midazolam, midazolam, ondansetron **OR** ondansetron (ZOFRAN) IV, sodium chloride flush  Antibiotics: Anti-infectives    Start     Dose/Rate Route Frequency Ordered Stop   12/24/15 1200  imipenem-cilastatin (PRIMAXIN) 500 mg in sodium chloride 0.9 % 100 mL IVPB     500 mg 200 mL/hr over 30 Minutes Intravenous Every 6 hours 12/24/15 0913     12/24/15 0930  vancomycin (VANCOCIN) IVPB 1000 mg/200 mL premix     1,000 mg 200 mL/hr over 60 Minutes Intravenous Every 12 hours 12/24/15 0913     12/23/15 0800  imipenem-cilastatin (PRIMAXIN) 500 mg in sodium chloride 0.9 % 100 mL IVPB  Status:  Discontinued     500 mg 200 mL/hr  over 30 Minutes Intravenous Every 8 hours 12/23/15 0705 12/24/15 0913        Assessment/Plan Gallstone pancreatitis, severe  SIRS -appears to be improving, continue NPO for now, continue supportive care -likely interval cholecystectomy in 4-6 weeks after he recovers given severity Acute respiratory failure-on vent ID-primaxain VTE prophylaxis-SCD/heparin  Erby Pian, ANP-BC [facility] Pager [phone](7A-4:30P) For consults and floor pages call [phone](7A-4:30P)  12/25/2015 7:22 AM

## 2015-12-25 NOTE — Progress Notes (Signed)
Pharmacy Antibiotic Note  Timothy [name] is a 40 y.o. male admitted on 12/21/2015 with acute pancreatitis.  Pharmacy has been consulted for vancomycin and primaxin dosing.  WBC up to 21.9 on admit, down to 12.9. LA has improved 4.55>1.8. Has AKI, resolving down to 1.46.   Plan: Vancomycin 1000 mg IV q12h Imipenem 500 mg IV q6h Monitor cultures, renal function, LOT, VT at SS prn  Height: 5\' 11"  (180.3 cm) Weight: 283 lb 4.7 oz (128.5 kg) IBW/kg (Calculated) : 75.3  Temp (24hrs), Avg:101.6 F (38.7 C), Min:100.2 F (37.9 C), Max:102.2 F (39 C)   Recent Labs Lab 12/21/15 2215  12/22/15 0500  12/23/15 0435 12/23/15 0710  12/23/15 1434 12/23/15 1646 12/23/15 1953 12/24/15 0304 12/24/15 0757 12/24/15 1149 12/24/15 1150 12/25/15 0425  WBC 21.9*  --  14.9*  --  17.5*  --   --   --   --   --   --  16.9*  --   --  12.9*  CREATININE 1.60*  < > 1.19  < > 3.12*  --   < > 2.40*  --  2.01* 1.54*  --  1.31*  --  1.46*  LATICACIDVEN  --   < >  --   < > 4.2* 2.6*  --  1.5 1.4  --   --   --   --  1.8  --   < > = values in this interval not displayed.  Estimated Creatinine Clearance: 91 mL/min (by C-G formula based on Cr of 1.46).    No Known Allergies  Antimicrobials this admission: Imipenem 5/24 >>  Vanc 5/25 >>   Dose adjustments this admission: n/a  Microbiology results: 5/25 BCx: NG x 2 days 5/23 MRSA PCR: negative  Thank you for allowing pharmacy to be a part of this patient's care.  Timothy [name], PharmD Clinical Pharmacy Resident Pager # [phone] 12/25/2015 10:01 AM

## 2015-12-25 NOTE — Progress Notes (Signed)
Initial Nutrition Assessment  DOCUMENTATION CODES:   Obesity unspecified  INTERVENTION:   -Once post-pyloric cortrak tube is placed, recommend:  Initiate trickle feeds of Vital High Protein @ 20 ml/hr via  cortrak tube  Tube feeding regimen provides 480 kcal (28% of needs), 42 grams of protein, and 401 ml of H2O.   -If able to advance TF, recommend:  Initiate Vital High Protein @ 20 ml/hr via cortrak tube and increase by 10 ml every 4 hours to goal rate of 80 ml/hr.   Tube feeding regimen provides 1920 kcal (100% of needs), 168 grams of protein, and 1605 ml of H2O.   NUTRITION DIAGNOSIS:   Inadequate oral intake related to inability to eat as evidenced by NPO status.  GOAL:   Provide needs based on ASPEN/SCCM guidelines  MONITOR:   Vent status, Labs, Weight trends, Skin, I & O's  REASON FOR ASSESSMENT:   Ventilator    ASSESSMENT:   40yo male with hx HTN admitted 5/23 with acute pancreatitis likely r/t gallstones. Tx ICU 5/25 for worsening SIRS, respiratory distress.   Pt admitted with acute respiratory failure related to inflammatory response from severe pancreatitis.   Patient is currently intubated on ventilator support MV: 18.0 L/min Temp (24hrs), Avg:101.6 F (38.7 C), Min:101.1 F (38.4 C), Max:102.2 F (39 C)  Hx obtained from pt wife. She reports that pt was in his usual state of health up until 5 days ago, when pt started to continuously vomit. PTA, pt was eating 3 meals per day; pt was focusing on lifestyle modifications to intentionally lose weight, including choosing healthier foods and participating in yoga.   Nutrition-Focused physical exam completed. Findings are no fat depletion, no muscle depletion, and mild edema.   Case discussed with RN. She confirms plan to place post-pyloric feeding tube- cortrak tube placement team aware of consult. Per MD notes and discussion with RN, plan to initiate post-pyloric trickle feedings once tube is placed.    Labs reviewed: Na: 147, Glucose: 213, CBGS: 101-179.   Diet Order:  Diet NPO time specified  Skin:  Reviewed, no issues  Last BM:  12/21/15  Height:   Ht Readings from Last 1 Encounters:  12/24/15 5\' 11"  (1.803 m)    Weight:   Wt Readings from Last 1 Encounters:  12/22/15 283 lb 4.7 oz (128.5 kg)    Ideal Body Weight:  78.2 kg  BMI:  Body mass index is 39.53 kg/(m^2).  Estimated Nutritional Needs:   Kcal:  1720-1955  Protein:  >156 grams  Fluid:  1.7-1.9 L  EDUCATION NEEDS:   No education needs identified at this time  Raenell Mensing A. Mayford KnifeWilliams, RD, LDN, CDE Pager: [phone] After hours Pager: [phone]

## 2015-12-25 NOTE — Progress Notes (Signed)
CRITICAL VALUE ALERT  Critical value received:  Calcium 6.2  Date of notification:  12/25/2015  Time of notification:  0520  Critical value read back:Yes.    Nurse who received alert:  Owens LofflerKaziah Miller, RN  MD notified (1st page):  Deterding, MD  Time of first page:  [phone]  MD notified (2nd page):  Time of second page:  Responding MD:  Deterding  Time MD responded:  [phone]

## 2015-12-25 NOTE — Progress Notes (Signed)
eLink Physician-Brief Progress Note Patient Name: Timothy CoyQuinten Mccune DOB: [birth year] MRN: [id number]   Date of Service  12/25/2015  HPI/Events of Note  CBG 274  eICU Interventions  Change SSI to resistant scale     Intervention Category Intermediate Interventions: Hyperglycemia - evaluation and treatment  Victoriah Wilds 12/25/2015, 5:26 PM

## 2015-12-26 ENCOUNTER — Inpatient Hospital Stay (HOSPITAL_COMMUNITY): Payer: Federal, State, Local not specified - PPO

## 2015-12-26 LAB — COMPREHENSIVE METABOLIC PANEL
ALT: 32 U/L (ref 17–63)
AST: 19 U/L (ref 15–41)
Albumin: 2.2 g/dL — ABNORMAL LOW (ref 3.5–5.0)
Alkaline Phosphatase: 62 U/L (ref 38–126)
Anion gap: 10 (ref 5–15)
BUN: 24 mg/dL — ABNORMAL HIGH (ref 6–20)
CO2: 20 mmol/L — ABNORMAL LOW (ref 22–32)
Calcium: 6.8 mg/dL — ABNORMAL LOW (ref 8.9–10.3)
Chloride: 119 mmol/L — ABNORMAL HIGH (ref 101–111)
Creatinine, Ser: 1.34 mg/dL — ABNORMAL HIGH (ref 0.61–1.24)
GFR calc Af Amer: >60 mL/min (ref >60)
GFR calc non Af Amer: >60 mL/min (ref >60)
Glucose, Bld: 265 mg/dL — ABNORMAL HIGH (ref 65–99)
Potassium: 3.6 mmol/L (ref 3.5–5.1)
Sodium: 149 mmol/L — ABNORMAL HIGH (ref 135–145)
Total Bilirubin: 1.4 mg/dL — ABNORMAL HIGH (ref 0.3–1.2)
Total Protein: 5.2 g/dL — ABNORMAL LOW (ref 6.5–8.1)

## 2015-12-26 LAB — CBC WITH DIFFERENTIAL/PLATELET
Basophils Absolute: 0 10*3/uL (ref 0.0–0.1)
Basophils Relative: 0 %
Eosinophils Absolute: 0 10*3/uL (ref 0.0–0.7)
Eosinophils Relative: 0 %
HCT: 36.8 % — ABNORMAL LOW (ref 39.0–52.0)
Hemoglobin: 12 g/dL — ABNORMAL LOW (ref 13.0–17.0)
Lymphocytes Relative: 8 %
Lymphs Abs: 1.1 10*3/uL (ref 0.7–4.0)
MCH: 29.9 pg (ref 26.0–34.0)
MCHC: 32.6 g/dL (ref 30.0–36.0)
MCV: 91.5 fL (ref 78.0–100.0)
Monocytes Absolute: 1.2 10*3/uL — ABNORMAL HIGH (ref 0.1–1.0)
Monocytes Relative: 9 %
Neutro Abs: 11.2 10*3/uL — ABNORMAL HIGH (ref 1.7–7.7)
Neutrophils Relative %: 83 %
Platelets: 252 10*3/uL (ref 150–400)
RBC: 4.02 MIL/uL — ABNORMAL LOW (ref 4.22–5.81)
RDW: 14.3 % (ref 11.5–15.5)
WBC: 13.5 10*3/uL — ABNORMAL HIGH (ref 4.0–10.5)

## 2015-12-26 LAB — POCT I-STAT 3, ART BLOOD GAS (G3+)
Acid-base deficit: 4 mmol/L — ABNORMAL HIGH (ref 0.0–2.0)
Bicarbonate: 18.6 mEq/L — ABNORMAL LOW (ref 20.0–24.0)
O2 Saturation: 93 %
Patient temperature: 102.2
TCO2: 19 mmol/L (ref 0–100)
pCO2 arterial: 27.4 mmHg — ABNORMAL LOW (ref 35.0–45.0)
pH, Arterial: 7.448 (ref 7.350–7.450)
pO2, Arterial: 71 mmHg — ABNORMAL LOW (ref 80.0–100.0)

## 2015-12-26 LAB — GLUCOSE, CAPILLARY
Glucose-Capillary: 198 mg/dL — ABNORMAL HIGH (ref 65–99)
Glucose-Capillary: 242 mg/dL — ABNORMAL HIGH (ref 65–99)
Glucose-Capillary: 246 mg/dL — ABNORMAL HIGH (ref 65–99)
Glucose-Capillary: 251 mg/dL — ABNORMAL HIGH (ref 65–99)
Glucose-Capillary: 257 mg/dL — ABNORMAL HIGH (ref 65–99)
Glucose-Capillary: 262 mg/dL — ABNORMAL HIGH (ref 65–99)
Glucose-Capillary: 264 mg/dL — ABNORMAL HIGH (ref 65–99)

## 2015-12-26 LAB — BLOOD GAS, ARTERIAL
Acid-base deficit: 1.5 mmol/L (ref 0.0–2.0)
Bicarbonate: 21.2 mEq/L (ref 20.0–24.0)
Drawn by: 441351
FIO2: 0.4
MECHVT: 600 mL
O2 Saturation: 95.4 %
PEEP: 5 cmH2O
Patient temperature: 102.2
RATE: 30 resp/min
TCO2: 22.1 mmol/L (ref 0–100)
pCO2 arterial: 29.9 mmHg — ABNORMAL LOW (ref 35.0–45.0)
pH, Arterial: 7.474 — ABNORMAL HIGH (ref 7.350–7.450)
pO2, Arterial: 78.7 mmHg — ABNORMAL LOW (ref 80.0–100.0)

## 2015-12-26 LAB — TROPONIN I
Troponin I: <0.03 ng/mL (ref <0.031)
Troponin I: 0.03 ng/mL (ref <0.031)
Troponin I: 0.03 ng/mL (ref <0.031)

## 2015-12-26 LAB — CALCIUM, IONIZED: Calcium, Ionized, Serum: 3.8 mg/dL — ABNORMAL LOW (ref 4.5–5.6)

## 2015-12-26 LAB — PROCALCITONIN: Procalcitonin: 1.93 ng/mL

## 2015-12-26 LAB — LIPASE, BLOOD: Lipase: 45 U/L (ref 11–51)

## 2015-12-26 MED ORDER — ERYTHROMYCIN ETHYLSUCCINATE 200 MG/5ML PO SUSR
200.0000 mg | Freq: Once | ORAL | Status: AC
  Administered 2015-12-26: 200 mg
  Filled 2015-12-26: qty 5

## 2015-12-26 MED ORDER — VITAL 1.5 CAL PO LIQD
1000.0000 mL | ORAL | Status: DC
  Administered 2015-12-26 – 2015-12-28 (×3): 1000 mL
  Filled 2015-12-26 (×8): qty 1000

## 2015-12-26 MED ORDER — NITROGLYCERIN 0.4 MG SL SUBL: 0.4000 mg | SUBLINGUAL_TABLET | SUBLINGUAL | Status: DC | PRN

## 2015-12-26 MED ORDER — FREE WATER
200.0000 mL | Freq: Three times a day (TID) | Status: DC
  Administered 2015-12-26 – 2015-12-27 (×3): 200 mL

## 2015-12-26 MED ORDER — FUROSEMIDE 10 MG/ML IJ SOLN
40.0000 mg | Freq: Once | INTRAMUSCULAR | Status: AC
  Administered 2015-12-26: 40 mg via INTRAVENOUS
  Filled 2015-12-26: qty 4

## 2015-12-26 NOTE — Progress Notes (Signed)
Patient complains of shortness of breath. Heart Rate in 140s. RRT called and at bedside to check vent pressures and to suction. Elink MD called and cameraed in. Orders to get a Chest Xray, ABG and EKG were given. Fentanyl bolus  was given per current orders. Fentanyl titrated up to 225 mcg per orders. Will continue to monitor patient.

## 2015-12-26 NOTE — Progress Notes (Signed)
Patient ID: Timothy [name], male   DOB: 11-20-1975, 40 y.o.   MRN: [id number]     [facility]      [address] [place], [place] [address]    Phone: [phone] FAX: [phone]     Subjective: Tachy 120s, tachypnic 30s.  bp stable. Febrile, t max 102.6 on atbx.  Objective:  Vital signs:  Filed Vitals:   12/26/15 0500 12/26/15 0600 12/26/15 0700 12/26/15 0756  BP: 128/84 147/85 146/90 146/90  Pulse: 140 135 128 123  Temp: 102.6 F (39.2 C) 102.2 F (39 C) 101.8 F (38.8 C) 101.8 F (38.8 C)  TempSrc:      Resp: _0 Height:      Weight:      SpO2: 94% 95% 96% 96%    Last BM Date: 12/21/15  Intake/Output   Yesterday:  05/26 0701 - 05/27 0700 In: 3119.1 [I.V.:2179.1; NG/GT:140; IV Piggyback:800] Out: 3070 [Urine:2620; Emesis/NG output:450] This shift:    I/O last 3 completed shifts: In: 5436.7 [I.V.:3996.7; NG/GT:140; IV Piggyback:1300] Out: 3474 [Urine:3835; Emesis/NG output:550]     Physical Exam: General: Pt awake on the vent.   Abdomen: Soft. non tender. No evidence of peritonitis. No incarcerated hernias.    Problem List:   Principal Problem:   Acute pancreatitis Active Problems:   Hypocalcemia   AKI (acute kidney injury) ([place])   Accelerated hypertension   Leukocytosis   Lactic acidosis   Hyperglycemia   Obesity   Hypokalemia   Acute respiratory failure (HCC)   Pancreatitis, acute   Acute respiratory failure with hypoxia (HCC)    Results:   Labs: Results for orders placed or performed during the hospital encounter of 12/21/15 (from the past 48 hour(s))  Glucose, capillary     Status: Abnormal   Collection Time: 12/24/15  8:23 AM  Result Value Ref Range   Glucose-Capillary 208 (H) 65 - 99 mg/dL  Glucose, capillary     Status: Abnormal   Collection Time: 12/24/15 10:35 AM  Result Value Ref Range   Glucose-Capillary 216 (H) 65 - 99 mg/dL   Comment 1 Arterial Specimen   Blood  gas, arterial     Status: Abnormal   Collection Time: 12/24/15 10:40 AM  Result Value Ref Range   FIO2 1.00    Delivery systems VENTILATOR    Mode PRESSURE REGULATED VOLUME CONTROL    VT 600 mL   LHR 30 resp/min   Peep/cpap 5.0 cm H20   pH, Arterial 7.358 7.350 - 7.450   pCO2 arterial 29.4 (L) 35.0 - 45.0 mmHg   pO2, Arterial 212 (H) 80.0 - 100.0 mmHg   Bicarbonate 16.1 (L) 20.0 - 24.0 mEq/L   TCO2 17.0 0 - 100 mmol/L   Acid-base deficit 8.3 (H) 0.0 - 2.0 mmol/L   O2 Saturation 99.4 %   Patient temperature 99.0    Collection site RIGHT RADIAL    Drawn by [id number]    Sample type ARTERIAL DRAW    Allens test (pass/fail) PASS PASS  Glucose, capillary     Status: Abnormal   Collection Time: 12/24/15 11:37 AM  Result Value Ref Range   Glucose-Capillary 216 (H) 65 - 99 mg/dL  Procalcitonin - Baseline     Status: None   Collection Time: 12/24/15 11:48 AM  Result Value Ref Range   Procalcitonin 4.16 ng/mL    Comment:        Interpretation: PCT > 2 ng/mL: Systemic infection (sepsis)  is likely, unless other causes are known. (NOTE)         ICU PCT Algorithm               Non ICU PCT Algorithm    ----------------------------     ------------------------------         PCT < 0.25 ng/mL                 PCT < 0.1 ng/mL     Stopping of antibiotics            Stopping of antibiotics       strongly encouraged.               strongly encouraged.    ----------------------------     ------------------------------       PCT level decrease by               PCT < 0.25 ng/mL       >= 80% from peak PCT       OR PCT 0.25 - 0.5 ng/mL          Stopping of antibiotics                                             encouraged.     Stopping of antibiotics           encouraged.    ----------------------------     ------------------------------       PCT level decrease by              PCT >= 0.25 ng/mL       < 80% from peak PCT        AND PCT >= 0.5 ng/mL            Continuing antibiotics                                                encouraged.       Continuing antibiotics            encouraged.    ----------------------------     ------------------------------     PCT level increase compared          PCT > 0.5 ng/mL         with peak PCT AND          PCT >= 0.5 ng/mL             Escalation of antibiotics                                          strongly encouraged.      Escalation of antibiotics        strongly encouraged.   Basic metabolic panel     Status: Abnormal   Collection Time: 12/24/15 11:49 AM  Result Value Ref Range   Sodium 146 (H) 135 - 145 mmol/L   Potassium 4.0 3.5 - 5.1 mmol/L   Chloride 120 (H) 101 - 111 mmol/L   CO2 18 (L) 22 - 32 mmol/L   Glucose, Bld 227 (H) 65 - 99 mg/dL   BUN 40 (  H) 6 - 20 mg/dL   Creatinine, Ser 1.31 (H) 0.61 - 1.24 mg/dL   Calcium 6.5 (L) 8.9 - 10.3 mg/dL   GFR calc non Af Amer >60 >60 mL/min   GFR calc Af Amer >60 >60 mL/min    Comment: (NOTE) The eGFR has been calculated using the CKD EPI equation. This calculation has not been validated in all clinical situations. eGFR's persistently <60 mL/min signify possible Chronic Kidney Disease.    Anion gap 8 5 - 15  Lactic acid, plasma     Status: None   Collection Time: 12/24/15 11:50 AM  Result Value Ref Range   Lactic Acid, Venous 1.8 0.5 - 2.0 mmol/L  Glucose, capillary     Status: Abnormal   Collection Time: 12/24/15 12:23 PM  Result Value Ref Range   Glucose-Capillary 237 (H) 65 - 99 mg/dL   Comment 1 Capillary Specimen   Glucose, capillary     Status: Abnormal   Collection Time: 12/24/15  1:41 PM  Result Value Ref Range   Glucose-Capillary 177 (H) 65 - 99 mg/dL   Comment 1 Capillary Specimen   Glucose, capillary     Status: Abnormal   Collection Time: 12/24/15  2:39 PM  Result Value Ref Range   Glucose-Capillary 184 (H) 65 - 99 mg/dL   Comment 1 Capillary Specimen   Glucose, capillary     Status: Abnormal   Collection Time: 12/24/15  3:45 PM  Result Value Ref Range    Glucose-Capillary 167 (H) 65 - 99 mg/dL   Comment 1 Capillary Specimen   Glucose, capillary     Status: Abnormal   Collection Time: 12/24/15  4:30 PM  Result Value Ref Range   Glucose-Capillary 161 (H) 65 - 99 mg/dL   Comment 1 Capillary Specimen   Glucose, capillary     Status: Abnormal   Collection Time: 12/24/15  5:42 PM  Result Value Ref Range   Glucose-Capillary 166 (H) 65 - 99 mg/dL   Comment 1 Arterial Specimen   Glucose, capillary     Status: Abnormal   Collection Time: 12/24/15  6:37 PM  Result Value Ref Range   Glucose-Capillary 153 (H) 65 - 99 mg/dL   Comment 1 Arterial Specimen   Glucose, capillary     Status: Abnormal   Collection Time: 12/24/15  7:43 PM  Result Value Ref Range   Glucose-Capillary 150 (H) 65 - 99 mg/dL   Comment 1 Notify RN   Glucose, capillary     Status: Abnormal   Collection Time: 12/24/15  8:47 PM  Result Value Ref Range   Glucose-Capillary 137 (H) 65 - 99 mg/dL   Comment 1 Venous Specimen   Glucose, capillary     Status: Abnormal   Collection Time: 12/24/15  9:49 PM  Result Value Ref Range   Glucose-Capillary 146 (H) 65 - 99 mg/dL  Glucose, capillary     Status: Abnormal   Collection Time: 12/24/15 10:50 PM  Result Value Ref Range   Glucose-Capillary 172 (H) 65 - 99 mg/dL   Comment 1 Arterial Specimen   Glucose, capillary     Status: Abnormal   Collection Time: 12/24/15 11:52 PM  Result Value Ref Range   Glucose-Capillary 169 (H) 65 - 99 mg/dL   Comment 1 Venous Specimen   Glucose, capillary     Status: Abnormal   Collection Time: 12/25/15  4:06 AM  Result Value Ref Range   Glucose-Capillary 179 (H) 65 - 99 mg/dL   Comment 1  Arterial Specimen   Comprehensive metabolic panel     Status: Abnormal   Collection Time: 12/25/15  4:25 AM  Result Value Ref Range   Sodium 147 (H) 135 - 145 mmol/L   Potassium 3.9 3.5 - 5.1 mmol/L   Chloride 123 (H) 101 - 111 mmol/L   CO2 18 (L) 22 - 32 mmol/L   Glucose, Bld 213 (H) 65 - 99 mg/dL   BUN 39  (H) 6 - 20 mg/dL   Creatinine, Ser 1.46 (H) 0.61 - 1.24 mg/dL   Calcium 6.2 (LL) 8.9 - 10.3 mg/dL    Comment: CRITICAL RESULT CALLED TO, READ BACK BY AND VERIFIED WITH: MILLER K,RN 12/25/15 0522 WAYK    Total Protein 5.0 (L) 6.5 - 8.1 g/dL   Albumin 2.4 (L) 3.5 - 5.0 g/dL   AST 27 15 - 41 U/L   ALT 47 17 - 63 U/L   Alkaline Phosphatase 85 38 - 126 U/L   Total Bilirubin 1.3 (H) 0.3 - 1.2 mg/dL   GFR calc non Af Amer 58 (L) >60 mL/min   GFR calc Af Amer >60 >60 mL/min    Comment: (NOTE) The eGFR has been calculated using the CKD EPI equation. This calculation has not been validated in all clinical situations. eGFR's persistently <60 mL/min signify possible Chronic Kidney Disease.    Anion gap 6 5 - 15  Lipase, blood     Status: Abnormal   Collection Time: 12/25/15  4:25 AM  Result Value Ref Range   Lipase 128 (H) 11 - 51 U/L  CBC     Status: Abnormal   Collection Time: 12/25/15  4:25 AM  Result Value Ref Range   WBC 12.9 (H) 4.0 - 10.5 K/uL   RBC 4.18 (L) 4.22 - 5.81 MIL/uL   Hemoglobin 12.4 (L) 13.0 - 17.0 g/dL   HCT 38.2 (L) 39.0 - 52.0 %   MCV 91.4 78.0 - 100.0 fL   MCH 29.7 26.0 - 34.0 pg   MCHC 32.5 30.0 - 36.0 g/dL   RDW 14.2 11.5 - 15.5 %   Platelets 223 150 - 400 K/uL  Procalcitonin     Status: None   Collection Time: 12/25/15  4:25 AM  Result Value Ref Range   Procalcitonin 3.65 ng/mL    Comment:        Interpretation: PCT > 2 ng/mL: Systemic infection (sepsis) is likely, unless other causes are known. (NOTE)         ICU PCT Algorithm               Non ICU PCT Algorithm    ----------------------------     ------------------------------         PCT < 0.25 ng/mL                 PCT < 0.1 ng/mL     Stopping of antibiotics            Stopping of antibiotics       strongly encouraged.               strongly encouraged.    ----------------------------     ------------------------------       PCT level decrease by               PCT < 0.25 ng/mL       >= 80% from  peak PCT       OR PCT 0.25 - 0.5 ng/mL  Stopping of antibiotics                                             encouraged.     Stopping of antibiotics           encouraged.    ----------------------------     ------------------------------       PCT level decrease by              PCT >= 0.25 ng/mL       < 80% from peak PCT        AND PCT >= 0.5 ng/mL            Continuing antibiotics                                               encouraged.       Continuing antibiotics            encouraged.    ----------------------------     ------------------------------     PCT level increase compared          PCT > 0.5 ng/mL         with peak PCT AND          PCT >= 0.5 ng/mL             Escalation of antibiotics                                          strongly encouraged.      Escalation of antibiotics        strongly encouraged.   Calcium, ionized     Status: Abnormal   Collection Time: 12/25/15  6:15 AM  Result Value Ref Range   Calcium, Ionized, Serum 3.8 (L) 4.5 - 5.6 mg/dL    Comment: (NOTE) Performed At: [facility] [place], [place] [id number] Lindon Romp MD [id number]   Glucose, capillary     Status: Abnormal   Collection Time: 12/25/15  7:29 AM  Result Value Ref Range   Glucose-Capillary 200 (H) 65 - 99 mg/dL   Comment 1 Arterial Specimen   Blood gas, arterial     Status: Abnormal   Collection Time: 12/25/15 10:30 AM  Result Value Ref Range   FIO2 0.40    Delivery systems VENTILATOR    Mode PRESSURE REGULATED VOLUME CONTROL    VT 600 mL   LHR 30 resp/min   Peep/cpap 5.0 cm H20   pH, Arterial 7.454 (H) 7.350 - 7.450   pCO2 arterial 23.4 (L) 35.0 - 45.0 mmHg   pO2, Arterial 71.5 (L) 80.0 - 100.0 mmHg   Bicarbonate 16.2 (L) 20.0 - 24.0 mEq/L   TCO2 16.9 0 - 100 mmol/L   Acid-base deficit 7.0 (H) 0.0 - 2.0 mmol/L   O2 Saturation 95.1 %   Patient temperature 98.6    Collection site A-LINE    Drawn by [phone]    Sample type ARTERIAL  DRAW    Allens test (pass/fail) PASS PASS  Glucose, capillary     Status: Abnormal   Collection Time: 12/25/15 12:41 PM  Result Value  Ref Range   Glucose-Capillary 238 (H) 65 - 99 mg/dL  Cortisol     Status: None   Collection Time: 12/25/15  2:35 PM  Result Value Ref Range   Cortisol, Plasma 27.7 ug/dL    Comment: (NOTE) AM    6.7 - 22.6 ug/dL PM   <10.0       ug/dL   Glucose, capillary     Status: Abnormal   Collection Time: 12/25/15  4:30 PM  Result Value Ref Range   Glucose-Capillary 274 (H) 65 - 99 mg/dL   Comment 1 Arterial Specimen   Glucose, capillary     Status: Abnormal   Collection Time: 12/25/15  8:00 PM  Result Value Ref Range   Glucose-Capillary 282 (H) 65 - 99 mg/dL   Comment 1 Notify RN    Comment 2 Document in Chart   Glucose, capillary     Status: Abnormal   Collection Time: 12/26/15 12:10 AM  Result Value Ref Range   Glucose-Capillary 198 (H) 65 - 99 mg/dL  Blood gas, arterial     Status: Abnormal   Collection Time: 12/26/15  3:20 AM  Result Value Ref Range   FIO2 0.40    Delivery systems VENTILATOR    Mode PRESSURE REGULATED VOLUME CONTROL    VT 600 mL   LHR 30 resp/min   Peep/cpap 5.0 cm H20   pH, Arterial 7.474 (H) 7.350 - 7.450   pCO2 arterial 29.9 (L) 35.0 - 45.0 mmHg   pO2, Arterial 78.7 (L) 80.0 - 100.0 mmHg   Bicarbonate 21.2 20.0 - 24.0 mEq/L   TCO2 22.1 0 - 100 mmol/L   Acid-base deficit 1.5 0.0 - 2.0 mmol/L   O2 Saturation 95.4 %   Patient temperature 102.2    Collection site A-LINE    Drawn by [id number]    Sample type ARTERIAL DRAW    Allens test (pass/fail) PASS PASS  Glucose, capillary     Status: Abnormal   Collection Time: 12/26/15  4:11 AM  Result Value Ref Range   Glucose-Capillary 246 (H) 65 - 99 mg/dL   Comment 1 Capillary Specimen   Comprehensive metabolic panel     Status: Abnormal   Collection Time: 12/26/15  4:42 AM  Result Value Ref Range   Sodium 149 (H) 135 - 145 mmol/L   Potassium 3.6 3.5 - 5.1 mmol/L   Chloride  119 (H) 101 - 111 mmol/L   CO2 20 (L) 22 - 32 mmol/L   Glucose, Bld 265 (H) 65 - 99 mg/dL   BUN 24 (H) 6 - 20 mg/dL   Creatinine, Ser 1.34 (H) 0.61 - 1.24 mg/dL   Calcium 6.8 (L) 8.9 - 10.3 mg/dL   Total Protein 5.2 (L) 6.5 - 8.1 g/dL   Albumin 2.2 (L) 3.5 - 5.0 g/dL   AST 19 15 - 41 U/L   ALT 32 17 - 63 U/L   Alkaline Phosphatase 62 38 - 126 U/L   Total Bilirubin 1.4 (H) 0.3 - 1.2 mg/dL   GFR calc non Af Amer >60 >60 mL/min   GFR calc Af Amer >60 >60 mL/min    Comment: (NOTE) The eGFR has been calculated using the CKD EPI equation. This calculation has not been validated in all clinical situations. eGFR's persistently <60 mL/min signify possible Chronic Kidney Disease.    Anion gap 10 5 - 15  Lipase, blood     Status: None   Collection Time: 12/26/15  4:42 AM  Result Value Ref Range  Lipase 45 11 - 51 U/L  Procalcitonin     Status: None   Collection Time: 12/26/15  4:42 AM  Result Value Ref Range   Procalcitonin 1.93 ng/mL    Comment:        Interpretation: PCT > 0.5 ng/mL and <= 2 ng/mL: Systemic infection (sepsis) is possible, but other conditions are known to elevate PCT as well. (NOTE)         ICU PCT Algorithm               Non ICU PCT Algorithm    ----------------------------     ------------------------------         PCT < 0.25 ng/mL                 PCT < 0.1 ng/mL     Stopping of antibiotics            Stopping of antibiotics       strongly encouraged.               strongly encouraged.    ----------------------------     ------------------------------       PCT level decrease by               PCT < 0.25 ng/mL       >= 80% from peak PCT       OR PCT 0.25 - 0.5 ng/mL          Stopping of antibiotics                                             encouraged.     Stopping of antibiotics           encouraged.    ----------------------------     ------------------------------       PCT level decrease by              PCT >= 0.25 ng/mL       < 80% from peak PCT         AND PCT >= 0.5 ng/mL             Continuing antibiotics                                              encouraged.       Continuing antibiotics            encouraged.    ----------------------------     ------------------------------     PCT level increase compared          PCT > 0.5 ng/mL         with peak PCT AND          PCT >= 0.5 ng/mL             Escalation of antibiotics                                          strongly encouraged.      Escalation of antibiotics        strongly encouraged.   CBC with Differential/Platelet     Status: Abnormal  Collection Time: 12/26/15  4:42 AM  Result Value Ref Range   WBC 13.5 (H) 4.0 - 10.5 K/uL   RBC 4.02 (L) 4.22 - 5.81 MIL/uL   Hemoglobin 12.0 (L) 13.0 - 17.0 g/dL   HCT 36.8 (L) 39.0 - 52.0 %   MCV 91.5 78.0 - 100.0 fL   MCH 29.9 26.0 - 34.0 pg   MCHC 32.6 30.0 - 36.0 g/dL   RDW 14.3 11.5 - 15.5 %   Platelets 252 150 - 400 K/uL   Neutrophils Relative % 83 %   Neutro Abs 11.2 (H) 1.7 - 7.7 K/uL   Lymphocytes Relative 8 %   Lymphs Abs 1.1 0.7 - 4.0 K/uL   Monocytes Relative 9 %   Monocytes Absolute 1.2 (H) 0.1 - 1.0 K/uL   Eosinophils Relative 0 %   Eosinophils Absolute 0.0 0.0 - 0.7 K/uL   Basophils Relative 0 %   Basophils Absolute 0.0 0.0 - 0.1 K/uL  I-STAT 3, arterial blood gas (G3+)     Status: Abnormal   Collection Time: 12/26/15  4:45 AM  Result Value Ref Range   pH, Arterial 7.448 7.350 - 7.450   pCO2 arterial 27.4 (L) 35.0 - 45.0 mmHg   pO2, Arterial 71.0 (L) 80.0 - 100.0 mmHg   Bicarbonate 18.6 (L) 20.0 - 24.0 mEq/L   TCO2 19 0 - 100 mmol/L   O2 Saturation 93.0 %   Acid-base deficit 4.0 (H) 0.0 - 2.0 mmol/L   Patient temperature 102.2 F    Collection site RADIAL, ALLEN'S TEST ACCEPTABLE    Drawn by RT    Sample type ARTERIAL   Troponin I     Status: None   Collection Time: 12/26/15  6:22 AM  Result Value Ref Range   Troponin I <0.03 <0.031 ng/mL    Comment:        NO INDICATION OF MYOCARDIAL INJURY.      Imaging / Studies: Dg Abd 1 View  12/25/2015  CLINICAL DATA:  Feeding tube placement EXAM: ABDOMEN - 1 VIEW COMPARISON:  None. FINDINGS: Feeding tube has been placed. Tip overlies the level of the descending portion of the duodenum. There is mild patient motion artifact, limiting detail. IMPRESSION: Feeding tube tip overlying the level of the descending portion the duodenum. Electronically Signed   By: Nolon Nations M.D.   On: 12/25/2015 14:41   Dg Chest Port 1 View  12/24/2015  CLINICAL DATA:  Central line placement EXAM: PORTABLE CHEST 1 VIEW COMPARISON:  12/24/2015 FINDINGS: Borderline cardiomegaly. Endotracheal tube in place with tip 3.2 cm above the carina. NG tube in place. Central mild vascular congestion and mild perihilar interstitial prominence suspicious for mild interstitial edema. Left IJ central line with tip in SVC. No pneumothorax. Mild basilar atelectasis. IMPRESSION: Borderline cardiomegaly. Endotracheal tube and NG tube in place. Left IJ central line with tip in SVC. No pneumothorax. Mild basilar atelectasis. Central mild vascular congestion and mild perihilar interstitial prominence suspicious for mild interstitial edema pre Electronically Signed   By: Lahoma Crocker M.D.   On: 12/24/2015 10:27    Medications / Allergies:  Scheduled Meds: . antiseptic oral rinse  7 mL Mouth Rinse 10 times per day  . chlorhexidine gluconate (SAGE KIT)  15 mL Mouth Rinse BID  . fentaNYL (SUBLIMAZE) injection  50 mcg Intravenous Once  . heparin subcutaneous  5,000 Units Subcutaneous Q8H  . imipenem-cilastatin  500 mg Intravenous Q6H  . insulin aspart  0-20 Units Subcutaneous Q4H  . insulin  glargine  20 Units Subcutaneous Q24H  . pantoprazole (PROTONIX) IV  40 mg Intravenous Q24H  . sodium chloride flush  10-40 mL Intracatheter Q12H  . vancomycin  1,000 mg Intravenous Q12H   Continuous Infusions: . dextrose 5 % 1,000 mL with sodium acetate 150 mEq infusion 75 mL/hr at 12/26/15 0146  .  fentaNYL infusion INTRAVENOUS 225 mcg/hr (12/26/15 0430)  . phenylephrine (NEO-SYNEPHRINE) Adult infusion Stopped (12/25/15 1830)   PRN Meds:.Place/Maintain arterial line **AND** sodium chloride, acetaminophen (TYLENOL) oral liquid 160 mg/5 mL, fentaNYL, hydrALAZINE, nitroGLYCERIN, ondansetron **OR** ondansetron (ZOFRAN) IV, sodium chloride flush  Antibiotics: Anti-infectives    Start     Dose/Rate Route Frequency Ordered Stop   12/24/15 1200  imipenem-cilastatin (PRIMAXIN) 500 mg in sodium chloride 0.9 % 100 mL IVPB     500 mg 200 mL/hr over 30 Minutes Intravenous Every 6 hours 12/24/15 0913     12/24/15 0930  vancomycin (VANCOCIN) IVPB 1000 mg/200 mL premix     1,000 mg 200 mL/hr over 60 Minutes Intravenous Every 12 hours 12/24/15 0913     12/23/15 0800  imipenem-cilastatin (PRIMAXIN) 500 mg in sodium chloride 0.9 % 100 mL IVPB  Status:  Discontinued     500 mg 200 mL/hr over 30 Minutes Intravenous Every 8 hours 12/23/15 0705 12/24/15 0913         Assessment/Plan Gallstone pancreatitis, severe  SIRS -appears to be improving, agree with post pyloric trickle tube feeds, continue supportive care. -likely interval cholecystectomy in 4-6 weeks after he recovers given severity Acute respiratory failure-on vent ID-primaxain and vanc VTE prophylaxis-SCD/heparin   Erby Pian, ANP-BC [facility]   12/26/2015 8:09 AM

## 2015-12-26 NOTE — Progress Notes (Addendum)
eLink Physician-Brief Progress Note Patient Name: Timothy [name] DOB: May 25, 1976 MRN: [id number]   Date of Service  12/26/2015  HPI/Events of Note  Patient also noted to be febrile to 102.6 F. Already on Vancomycin and Primaxin. Patient has had fever treated with Tylenol to little effect.  eICU Interventions  Will order: 1. Cooling blanket.     Intervention Category Intermediate Interventions: Respiratory distress - evaluation and management  Timothy [name] 12/26/2015, 5:32 AM

## 2015-12-26 NOTE — Progress Notes (Signed)
eLink Physician-Brief Progress Note Patient Name: Timothy CoyQuinten Lumbert DOB: 03/16/1976 MRN: [id number]   Date of Service  12/26/2015  HPI/Events of Note  STAT portable CXR finally done at 6:20 AM. CXR c/w mild pulmonary congestion and cardiomegaly.   eICU Interventions  Will order: 1. Lasix 40 mg IV X 1.     Intervention Category Intermediate Interventions: Respiratory distress - evaluation and management  Khai Torbert Eugene 12/26/2015, 6:33 AM

## 2015-12-26 NOTE — Progress Notes (Signed)
eLink Physician-Brief Progress Note Patient Name: Timothy [name] DOB: 02-Mar-1976 MRN: [id number]   Date of Service  12/26/2015  HPI/Events of Note  Patient c/o SOB. He is intubated, ventilated and sedated. HR = 140. Appears to be sinus tachycardia on video. However, we have lost the [employer] bedside monitoring in eLink.  eICU Interventions  Will order: 1. Portable CXR now. 2. ABG now. 3. 12 Lead EKG now.     Intervention Category Intermediate Interventions: Respiratory distress - evaluation and management Minor Interventions: Clinical assessment - ordering diagnostic tests  Sommer,Steven Dennard Nipugene 12/26/2015, 4:40 AM

## 2015-12-26 NOTE — Progress Notes (Signed)
PULMONARY / CRITICAL CARE MEDICINE   Name: Timothy [name] MRN: [id number] DOB: 19-Nov-1975    ADMISSION DATE:  12/21/2015 CONSULTATION DATE:  12/24/2015  REFERRING MD:  Dr. Isidoro Donning  CHIEF COMPLAINT:  Pancreatitis and respiratory failure.  SUBJECTIVE:  Vasopressors weaned off 5/26.  BP now mildly elevated, pt awake on vent with fentanyl gtt.  Asking for food.  Weaned from 0900 - 1100 on PSV 8/5   VITAL SIGNS: BP 155/83 mmHg  Pulse 125  Temp(Src) 101.8 F (38.8 C) (Oral)  Resp 15  Ht  (1.803 m)  Wt 283 lb 4.7 oz (128.5 kg)  BMI 39.53 kg/m2  SpO2 97%  HEMODYNAMICS: CVP:  [9 mmHg-15 mmHg] 9 mmHg  VENTILATOR SETTINGS: Vent Mode:  [-] PRVC FiO2 (%):  [40 %] 40 % Set Rate:  [30 bmp] 30 bmp Vt Set:  [600 mL] 600 mL PEEP:  [5 cmH20] 5 cmH20 Plateau Pressure:  [21 cmH20-27 cmH20] 21 cmH20  INTAKE / OUTPUT: I/O last 3 completed shifts: In: 5436.7 [I.V.:3996.7; NG/GT:140; IV Piggyback:1300] Out: 4385 [Urine:3835; Emesis/NG output:550]  PHYSICAL EXAMINATION: General:  Ill appearing male in NAD Neuro:  Awake, rass 0, appropriate, follows commands HEENT:  Mm moist, no JVD  Cardiovascular:  s1s2 RRR, no m/r/g Lungs: even/non-labored, lungs bilaterally coarse Abdomen:  Distended, tender, -bs, no r/g Musculoskeletal: edema mild Skin: no rashes or lesions  LABS:  BMET  Recent Labs Lab 12/24/15 1149 12/25/15 0425 12/26/15 0442  NA 146* 147* 149*  K 4.0 3.9 3.6  CL 120* 123* 119*  CO2 18* 18* 20*  BUN 40* 39* 24*  CREATININE 1.31* 1.46* 1.34*  GLUCOSE 227* 213* 265*    Electrolytes  Recent Labs Lab 12/24/15 1149 12/25/15 0425 12/26/15 0442  CALCIUM 6.5* 6.2* 6.8*    CBC  Recent Labs Lab 12/24/15 0757 12/25/15 0425 12/26/15 0442  WBC 16.9* 12.9* 13.5*  HGB 13.8 12.4* 12.0*  HCT 42.1 38.2* 36.8*  PLT 195 223 252    Coag's  Recent Labs Lab 12/21/15 2215  INR 1.14    Sepsis Markers  Recent Labs Lab 12/23/15 1434 12/23/15 1646  12/24/15 1148 12/24/15 1150 12/25/15 0425 12/26/15 0442  LATICACIDVEN 1.5 1.4  --  1.8  --   --   PROCALCITON  --   --  4.16  --  3.65 1.93    ABG  Recent Labs Lab 12/25/15 1030 12/26/15 0320 12/26/15 0445  PHART 7.454* 7.474* 7.448  PCO2ART 23.4* 29.9* 27.4*  PO2ART 71.5* 78.7* 71.0*    Liver Enzymes  Recent Labs Lab 12/24/15 0304 12/25/15 0425 12/26/15 0442  AST 71* 27 19  ALT 89* 47 32  ALKPHOS 54 85 62  BILITOT 0.9 1.3* 1.4*  ALBUMIN 2.8* 2.4* 2.2*    Cardiac Enzymes  Recent Labs Lab 12/21/15 2215 12/26/15 0622  TROPONINI <0.03 <0.03    Glucose  Recent Labs Lab 12/25/15 1241 12/25/15 1630 12/25/15 2000 12/26/15 0010 12/26/15 0411 12/26/15 0820  GLUCAP 238* 274* 282* 198* 246* 262*    Imaging Dg Abd 1 View  12/25/2015  CLINICAL DATA:  Feeding tube placement EXAM: ABDOMEN - 1 VIEW COMPARISON:  None. FINDINGS: Feeding tube has been placed. Tip overlies the level of the descending portion of the duodenum. There is mild patient motion artifact, limiting detail. IMPRESSION: Feeding tube tip overlying the level of the descending portion the duodenum. Electronically Signed   By: Norva Pavlov M.D.   On: 12/25/2015 14:41     STUDIES:  CTA chest/abd/pelvis 5/23 >>  Normal caliber thoracoabdominal aorta without dissection or acute aortic abnormality.  Marked peripancreatic inflammation consistent with pancreatitis.  Peripancreatic fluid tracking in the anterior para renal space and both pericolic gutters.  Mild gallbladder distention, no calcified stone. Hepatic steatosis. Sequela of granulomatous disease in the chest. ABD u/s 5/23 >>  Cholelithiasis without sonographic evidence for acute cholecystitis or biliary dilatation. Hepatic steatosis. ECHO 5/26 >> LVEF 65-70%, normal wall motion, grade 1 diastolic dysfunction, mildly dilated aortic root  CULTURES: BC x 2 5/23 >>  ANTIBIOTICS: Imipenem 5/24 >> vanc 5/25 >>  SIGNIFICANT EVENTS: 5/22  Admit  with acute pancreatitis 5/25  Tx ICU, pressors 5/27  Weaned 2 hours on 8/5 PSV  LINES/TUBES: ETT 5/25>>>  L IJ CVL 5/25>>>  DISCUSSION: 40yo male with hx HTN admitted 5/23 with acute pancreatitis likely r/t gallstones.  Tx ICU 5/25 for worsening SIRS, respiratory distress.    ASSESSMENT / PLAN:  PULMONARY Acute respiratory failure - r/t inflammatory response from severe pancreatitis  ALI? - mild vascular congestion noted on CXR P:   PRVC, 8cc/kg Wean PEEP / FiO2 for sats > 90% Daily SBT / WUA Intermittent CXR  CARDIOVASCULAR SIRS/sepsis - r/t pancreatitis  Shock - resolving  R/o pulm htn affecting cvp P:  ECHO as above ICU monitoring  Hold home lisinopril  PRN hydralazine for SBP > 170   RENAL nonAG metabolic acidosis  Hypernatremia  AKI, ATN P:   Trend BMP / UOP  Ensure adequate renal perfusion / normal MAP KVO D5w Free water 200ml Q8  GASTROINTESTINAL Acute pancreatitis - likely r/t gallstones, ? If he passed a stone, improved enzymes.  US without stone, mild distention of gallbladder P:   Surgery following  NPO  Cont abx as above  Needs repeat CT abd when more stable -per surgery POST pyloric feeding tube in place, begin trickle feeding with vital small peptid formulation  HEMATOLOGIC DVT prevention P:  F/u CBC  SQ heparin   INFECTIOUS Acute pancreatitis  Fever - secondary to above P:   ABX as above  Follow cultures  Narrow abx as able, d/c vancomycin 5/27 am given culture negative thus far Trend lactate, pct  PRN tylenol for temp > 101.5  Cooling blanket per pt's request   ENDOCRINE Hyperglycemia - hgbA1c=5.7   P:   Continue SSI CBG Q4  NEUROLOGIC Sedation needs on vent  P:   RASS goal: -1 Fentanyl gtt fo rpain  Daily WUA / SBT   FAMILY  - Updates:  Pt and wife updated at bedside 5/27  - Inter-disciplinary family meet or Palliative Care meeting due by:  Day 7     Canary BrimBrandi Ollis, NP-C [facility] Pgr:  [phone] or if no answer [phone] 12/26/2015, 9:36 AM  PCCM Attending Note: Patient seen and examined with nurse practitioner. Please refer to her note which I reviewed in detail. Patient has successfully weaned off of vasopressor support. Seems to be recovering well from his acute pancreatitis. Attempting to mobilize post pyloric feeding tube further postal ligament of [place] with 1 dose of erythromycin. Continuing imipenem today but discontinuing vancomycin given negative cultures. Continuing to wean ventilator support.  I have spent a total of 33 minutes of critical care time today caring for the patient and reviewing the patient's electronic medical record.  Donna ChristenJennings E. Jamison NeighborNestor, M.D. [facility] Pager:  [phone] After 3pm or if no response, call [phone] 8:23 PM 12/26/2015

## 2015-12-26 NOTE — Progress Notes (Signed)
eLink Physician-Brief Progress Note Patient Name: Timothy [name] DOB: Aug 09, 1975 MRN: [id number]   Date of Service  12/26/2015  HPI/Events of Note  ABG = 7.448/27.4/71.0 and EKG reveals Sinus Tachycardia with rate fo 141 and T wave changes in the inferior leads c/w ischemia. Still waiting for STAT CXR.  eICU Interventions  Will order: 1. NTG 0.4 mg SL Q 5 minutes PRN.     Intervention Category Intermediate Interventions: Respiratory distress - evaluation and management  [name] 12/26/2015, 5:27 AM

## 2015-12-27 ENCOUNTER — Inpatient Hospital Stay (HOSPITAL_COMMUNITY): Payer: Federal, State, Local not specified - PPO

## 2015-12-27 DIAGNOSIS — R739 Hyperglycemia, unspecified: Secondary | ICD-10-CM

## 2015-12-27 LAB — COMPREHENSIVE METABOLIC PANEL
ALT: 24 U/L (ref 17–63)
AST: 17 U/L (ref 15–41)
Albumin: 2 g/dL — ABNORMAL LOW (ref 3.5–5.0)
Alkaline Phosphatase: 57 U/L (ref 38–126)
Anion gap: 6 (ref 5–15)
BUN: 20 mg/dL (ref 6–20)
CO2: 27 mmol/L (ref 22–32)
Calcium: 6.9 mg/dL — ABNORMAL LOW (ref 8.9–10.3)
Chloride: 119 mmol/L — ABNORMAL HIGH (ref 101–111)
Creatinine, Ser: 1.19 mg/dL (ref 0.61–1.24)
GFR calc Af Amer: >60 mL/min (ref >60)
GFR calc non Af Amer: >60 mL/min (ref >60)
Glucose, Bld: 237 mg/dL — ABNORMAL HIGH (ref 65–99)
Potassium: 3.5 mmol/L (ref 3.5–5.1)
Sodium: 152 mmol/L — ABNORMAL HIGH (ref 135–145)
Total Bilirubin: 1 mg/dL (ref 0.3–1.2)
Total Protein: 4.9 g/dL — ABNORMAL LOW (ref 6.5–8.1)

## 2015-12-27 LAB — GLUCOSE, CAPILLARY
Glucose-Capillary: 190 mg/dL — ABNORMAL HIGH (ref 65–99)
Glucose-Capillary: 218 mg/dL — ABNORMAL HIGH (ref 65–99)
Glucose-Capillary: 220 mg/dL — ABNORMAL HIGH (ref 65–99)
Glucose-Capillary: 221 mg/dL — ABNORMAL HIGH (ref 65–99)
Glucose-Capillary: 239 mg/dL — ABNORMAL HIGH (ref 65–99)
Glucose-Capillary: 275 mg/dL — ABNORMAL HIGH (ref 65–99)

## 2015-12-27 LAB — CULTURE, BLOOD (ROUTINE X 2)
Culture: NO GROWTH
Culture: NO GROWTH

## 2015-12-27 LAB — CBC
HCT: 35.2 % — ABNORMAL LOW (ref 39.0–52.0)
Hemoglobin: 11.3 g/dL — ABNORMAL LOW (ref 13.0–17.0)
MCH: 29.8 pg (ref 26.0–34.0)
MCHC: 32.1 g/dL (ref 30.0–36.0)
MCV: 92.9 fL (ref 78.0–100.0)
Platelets: 221 10*3/uL (ref 150–400)
RBC: 3.79 MIL/uL — ABNORMAL LOW (ref 4.22–5.81)
RDW: 14.7 % (ref 11.5–15.5)
WBC: 9.7 10*3/uL (ref 4.0–10.5)

## 2015-12-27 MED ORDER — INSULIN GLARGINE 100 UNIT/ML ~~LOC~~ SOLN
25.0000 [IU] | SUBCUTANEOUS | Status: DC
  Administered 2015-12-27: 25 [IU] via SUBCUTANEOUS
  Filled 2015-12-27 (×2): qty 0.25

## 2015-12-27 MED ORDER — POTASSIUM CHLORIDE 10 MEQ/50ML IV SOLN
10.0000 meq | INTRAVENOUS | Status: AC
  Administered 2015-12-27 (×4): 10 meq via INTRAVENOUS
  Filled 2015-12-27 (×3): qty 50

## 2015-12-27 MED ORDER — CLONIDINE HCL 0.1 MG PO TABS
0.1000 mg | ORAL_TABLET | Freq: Three times a day (TID) | ORAL | Status: DC
  Administered 2015-12-27 – 2015-12-30 (×9): 0.1 mg via ORAL
  Filled 2015-12-27 (×9): qty 1

## 2015-12-27 MED ORDER — DIPHENHYDRAMINE HCL 25 MG PO CAPS
25.0000 mg | ORAL_CAPSULE | Freq: Every evening | ORAL | Status: DC | PRN
  Administered 2015-12-27 – 2015-12-31 (×2): 25 mg via ORAL
  Filled 2015-12-27 (×2): qty 1

## 2015-12-27 MED ORDER — FREE WATER
300.0000 mL | Freq: Three times a day (TID) | Status: DC
  Administered 2015-12-27 – 2015-12-28 (×4): 300 mL

## 2015-12-27 MED ORDER — FENTANYL CITRATE (PF) 100 MCG/2ML IJ SOLN: 25.0000 ug | INTRAMUSCULAR | Status: DC | PRN

## 2015-12-27 MED ORDER — WHITE PETROLATUM GEL
Status: AC
  Filled 2015-12-27: qty 1

## 2015-12-27 MED ORDER — SODIUM CHLORIDE 0.9 % IV SOLN
1.0000 g | Freq: Once | INTRAVENOUS | Status: AC
  Administered 2015-12-27: 1 g via INTRAVENOUS
  Filled 2015-12-27: qty 10

## 2015-12-27 MED ORDER — ACETAMINOPHEN 325 MG PO TABS: 650.0000 mg | ORAL_TABLET | ORAL | Status: DC | PRN

## 2015-12-27 NOTE — Progress Notes (Signed)
Hydralazine given 20mg 

## 2015-12-27 NOTE — Progress Notes (Signed)
eLink Physician-Brief Progress Note Patient Name: Timothy [name] DOB: [birth year] MRN: [id number]   Date of Service  12/27/2015  HPI/Events of Note  K+ = 3.5, Ca++ = 6.9 and Albumin - 2.0. Ca++ corrects to 8.5. Creatinine = 1.19.  eICU Interventions  Will replace K+ and Ca++.      Intervention Category Intermediate Interventions: Electrolyte abnormality - evaluation and management  [name] 12/27/2015, 5:42 AM

## 2015-12-27 NOTE — Progress Notes (Signed)
100 cc of Fentanyl wasted down the sink with Bronson CurbHannah Braxton Weisbecker, RN.

## 2015-12-27 NOTE — Progress Notes (Signed)
Patient ID: Timothy [name], male   DOB: 08/06/1975, 40 y.o.   MRN: [id number]     [address] [place], [place] [address]    Phone: [phone] FAX: [phone]     Subjective: tmax 100.8.  Denies abd pain.    Objective:  Vital signs:  Filed Vitals:   12/27/15 0400 12/27/15 0500 12/27/15 0600 12/27/15 0746  BP: 151/83 137/67 138/79 137/80  Pulse: 113 77 115 111  Temp: 100.8 F (38.2 C) 100.4 F (38 C) 100.2 F (37.9 C) 100 F (37.8 C)  TempSrc: Core (Comment) Core (Comment) Core (Comment)   Resp: '16 16  19  ' Height:      Weight: 134.9 kg (297 lb 6.4 oz)     SpO2: 98% 98% 97% 96%    Last BM Date: 12/21/15  Intake/Output   Yesterday:  05/27 0701 - 05/28 0700 In: 2338.4 [I.V.:1158.4; NG/GT:420; IV Piggyback:760] Out: 6237 [Urine:3175; Emesis/NG output:700] This shift:    I/O last 3 completed shifts: In: 3905.9 [I.V.:2325.9; NG/GT:420; IV SEGBTDVVO:1607] Out: 3710 [GYIRS:8546; Emesis/NG output:1150]    Physical Exam: General: Pt awake on the vent.   Abdomen: Soft. non tender. No evidence of peritonitis. No incarcerated hernias.   Problem List:   Principal Problem:   Acute pancreatitis Active Problems:   Hypocalcemia   AKI (acute kidney injury) ([place])   Accelerated hypertension   Leukocytosis   Lactic acidosis   Hyperglycemia   Obesity   Hypokalemia   Acute respiratory failure (HCC)   Pancreatitis, acute   Acute respiratory failure with hypoxia (HCC)    Results:   Labs: Results for orders placed or performed during the hospital encounter of 12/21/15 (from the past 48 hour(s))  Blood gas, arterial     Status: Abnormal   Collection Time: 12/25/15 10:30 AM  Result Value Ref Range   FIO2 0.40    Delivery systems VENTILATOR    Mode PRESSURE REGULATED VOLUME CONTROL    VT 600 mL   LHR 30 resp/min   Peep/cpap 5.0 cm H20   pH, Arterial 7.454 (H) 7.350 - 7.450   pCO2 arterial 23.4  (L) 35.0 - 45.0 mmHg   pO2, Arterial 71.5 (L) 80.0 - 100.0 mmHg   Bicarbonate 16.2 (L) 20.0 - 24.0 mEq/L   TCO2 16.9 0 - 100 mmol/L   Acid-base deficit 7.0 (H) 0.0 - 2.0 mmol/L   O2 Saturation 95.1 %   Patient temperature 98.6    Collection site A-LINE    Drawn by [phone]    Sample type ARTERIAL DRAW    Allens test (pass/fail) PASS PASS  Glucose, capillary     Status: Abnormal   Collection Time: 12/25/15 12:41 PM  Result Value Ref Range   Glucose-Capillary 238 (H) 65 - 99 mg/dL  Cortisol     Status: None   Collection Time: 12/25/15  2:35 PM  Result Value Ref Range   Cortisol, Plasma 27.7 ug/dL    Comment: (NOTE) AM    6.7 - 22.6 ug/dL PM   <10.0       ug/dL   Glucose, capillary     Status: Abnormal   Collection Time: 12/25/15  4:30 PM  Result Value Ref Range   Glucose-Capillary 274 (H) 65 - 99 mg/dL   Comment 1 Arterial Specimen   Glucose, capillary     Status: Abnormal   Collection Time: 12/25/15  8:00 PM  Result Value Ref Range  Glucose-Capillary 282 (H) 65 - 99 mg/dL   Comment 1 Notify RN    Comment 2 Document in Chart   Glucose, capillary     Status: Abnormal   Collection Time: 12/26/15 12:10 AM  Result Value Ref Range   Glucose-Capillary 198 (H) 65 - 99 mg/dL  Blood gas, arterial     Status: Abnormal   Collection Time: 12/26/15  3:20 AM  Result Value Ref Range   FIO2 0.40    Delivery systems VENTILATOR    Mode PRESSURE REGULATED VOLUME CONTROL    VT 600 mL   LHR 30 resp/min   Peep/cpap 5.0 cm H20   pH, Arterial 7.474 (H) 7.350 - 7.450   pCO2 arterial 29.9 (L) 35.0 - 45.0 mmHg   pO2, Arterial 78.7 (L) 80.0 - 100.0 mmHg   Bicarbonate 21.2 20.0 - 24.0 mEq/L   TCO2 22.1 0 - 100 mmol/L   Acid-base deficit 1.5 0.0 - 2.0 mmol/L   O2 Saturation 95.4 %   Patient temperature 102.2    Collection site A-LINE    Drawn by [id number]    Sample type ARTERIAL DRAW    Allens test (pass/fail) PASS PASS  Glucose, capillary     Status: Abnormal   Collection Time: 12/26/15   4:11 AM  Result Value Ref Range   Glucose-Capillary 246 (H) 65 - 99 mg/dL   Comment 1 Capillary Specimen   Comprehensive metabolic panel     Status: Abnormal   Collection Time: 12/26/15  4:42 AM  Result Value Ref Range   Sodium 149 (H) 135 - 145 mmol/L   Potassium 3.6 3.5 - 5.1 mmol/L   Chloride 119 (H) 101 - 111 mmol/L   CO2 20 (L) 22 - 32 mmol/L   Glucose, Bld 265 (H) 65 - 99 mg/dL   BUN 24 (H) 6 - 20 mg/dL   Creatinine, Ser 1.34 (H) 0.61 - 1.24 mg/dL   Calcium 6.8 (L) 8.9 - 10.3 mg/dL   Total Protein 5.2 (L) 6.5 - 8.1 g/dL   Albumin 2.2 (L) 3.5 - 5.0 g/dL   AST 19 15 - 41 U/L   ALT 32 17 - 63 U/L   Alkaline Phosphatase 62 38 - 126 U/L   Total Bilirubin 1.4 (H) 0.3 - 1.2 mg/dL   GFR calc non Af Amer >60 >60 mL/min   GFR calc Af Amer >60 >60 mL/min    Comment: (NOTE) The eGFR has been calculated using the CKD EPI equation. This calculation has not been validated in all clinical situations. eGFR's persistently <60 mL/min signify possible Chronic Kidney Disease.    Anion gap 10 5 - 15  Lipase, blood     Status: None   Collection Time: 12/26/15  4:42 AM  Result Value Ref Range   Lipase 45 11 - 51 U/L  Procalcitonin     Status: None   Collection Time: 12/26/15  4:42 AM  Result Value Ref Range   Procalcitonin 1.93 ng/mL    Comment:        Interpretation: PCT > 0.5 ng/mL and <= 2 ng/mL: Systemic infection (sepsis) is possible, but other conditions are known to elevate PCT as well. (NOTE)         ICU PCT Algorithm               Non ICU PCT Algorithm    ----------------------------     ------------------------------         PCT < 0.25 ng/mL  PCT < 0.1 ng/mL     Stopping of antibiotics            Stopping of antibiotics       strongly encouraged.               strongly encouraged.    ----------------------------     ------------------------------       PCT level decrease by               PCT < 0.25 ng/mL       >= 80% from peak PCT       OR PCT 0.25 - 0.5  ng/mL          Stopping of antibiotics                                             encouraged.     Stopping of antibiotics           encouraged.    ----------------------------     ------------------------------       PCT level decrease by              PCT >= 0.25 ng/mL       < 80% from peak PCT        AND PCT >= 0.5 ng/mL             Continuing antibiotics                                              encouraged.       Continuing antibiotics            encouraged.    ----------------------------     ------------------------------     PCT level increase compared          PCT > 0.5 ng/mL         with peak PCT AND          PCT >= 0.5 ng/mL             Escalation of antibiotics                                          strongly encouraged.      Escalation of antibiotics        strongly encouraged.   CBC with Differential/Platelet     Status: Abnormal   Collection Time: 12/26/15  4:42 AM  Result Value Ref Range   WBC 13.5 (H) 4.0 - 10.5 K/uL   RBC 4.02 (L) 4.22 - 5.81 MIL/uL   Hemoglobin 12.0 (L) 13.0 - 17.0 g/dL   HCT 36.8 (L) 39.0 - 52.0 %   MCV 91.5 78.0 - 100.0 fL   MCH 29.9 26.0 - 34.0 pg   MCHC 32.6 30.0 - 36.0 g/dL   RDW 14.3 11.5 - 15.5 %   Platelets 252 150 - 400 K/uL   Neutrophils Relative % 83 %   Neutro Abs 11.2 (H) 1.7 - 7.7 K/uL   Lymphocytes Relative 8 %   Lymphs Abs 1.1 0.7 - 4.0 K/uL   Monocytes Relative 9 %   Monocytes Absolute 1.2 (H) 0.1 -  1.0 K/uL   Eosinophils Relative 0 %   Eosinophils Absolute 0.0 0.0 - 0.7 K/uL   Basophils Relative 0 %   Basophils Absolute 0.0 0.0 - 0.1 K/uL  I-STAT 3, arterial blood gas (G3+)     Status: Abnormal   Collection Time: 12/26/15  4:45 AM  Result Value Ref Range   pH, Arterial 7.448 7.350 - 7.450   pCO2 arterial 27.4 (L) 35.0 - 45.0 mmHg   pO2, Arterial 71.0 (L) 80.0 - 100.0 mmHg   Bicarbonate 18.6 (L) 20.0 - 24.0 mEq/L   TCO2 19 0 - 100 mmol/L   O2 Saturation 93.0 %   Acid-base deficit 4.0 (H) 0.0 - 2.0 mmol/L    Patient temperature 102.2 F    Collection site RADIAL, ALLEN'S TEST ACCEPTABLE    Drawn by RT    Sample type ARTERIAL   Troponin I     Status: None   Collection Time: 12/26/15  6:22 AM  Result Value Ref Range   Troponin I <0.03 <0.031 ng/mL    Comment:        NO INDICATION OF MYOCARDIAL INJURY.   Glucose, capillary     Status: Abnormal   Collection Time: 12/26/15  8:20 AM  Result Value Ref Range   Glucose-Capillary 262 (H) 65 - 99 mg/dL   Comment 1 Notify RN   Troponin I     Status: None   Collection Time: 12/26/15 11:14 AM  Result Value Ref Range   Troponin I 0.03 <0.031 ng/mL    Comment:        NO INDICATION OF MYOCARDIAL INJURY.   Glucose, capillary     Status: Abnormal   Collection Time: 12/26/15 12:13 PM  Result Value Ref Range   Glucose-Capillary 264 (H) 65 - 99 mg/dL   Comment 1 Notify RN   Glucose, capillary     Status: Abnormal   Collection Time: 12/26/15  4:21 PM  Result Value Ref Range   Glucose-Capillary 242 (H) 65 - 99 mg/dL   Comment 1 Capillary Specimen   Troponin I     Status: None   Collection Time: 12/26/15  5:09 PM  Result Value Ref Range   Troponin I 0.03 <0.031 ng/mL    Comment:        NO INDICATION OF MYOCARDIAL INJURY.   Glucose, capillary     Status: Abnormal   Collection Time: 12/26/15  7:43 PM  Result Value Ref Range   Glucose-Capillary 251 (H) 65 - 99 mg/dL  Glucose, capillary     Status: Abnormal   Collection Time: 12/26/15 11:27 PM  Result Value Ref Range   Glucose-Capillary 257 (H) 65 - 99 mg/dL   Comment 1 Capillary Specimen   Glucose, capillary     Status: Abnormal   Collection Time: 12/27/15  3:26 AM  Result Value Ref Range   Glucose-Capillary 220 (H) 65 - 99 mg/dL  CBC     Status: Abnormal   Collection Time: 12/27/15  4:22 AM  Result Value Ref Range   WBC 9.7 4.0 - 10.5 K/uL   RBC 3.79 (L) 4.22 - 5.81 MIL/uL   Hemoglobin 11.3 (L) 13.0 - 17.0 g/dL   HCT 35.2 (L) 39.0 - 52.0 %   MCV 92.9 78.0 - 100.0 fL   MCH 29.8 26.0 -  34.0 pg   MCHC 32.1 30.0 - 36.0 g/dL   RDW 14.7 11.5 - 15.5 %   Platelets 221 150 - 400 K/uL  Comprehensive metabolic panel  Status: Abnormal   Collection Time: 12/27/15  4:22 AM  Result Value Ref Range   Sodium 152 (H) 135 - 145 mmol/L   Potassium 3.5 3.5 - 5.1 mmol/L   Chloride 119 (H) 101 - 111 mmol/L   CO2 27 22 - 32 mmol/L   Glucose, Bld 237 (H) 65 - 99 mg/dL   BUN 20 6 - 20 mg/dL   Creatinine, Ser 1.19 0.61 - 1.24 mg/dL   Calcium 6.9 (L) 8.9 - 10.3 mg/dL   Total Protein 4.9 (L) 6.5 - 8.1 g/dL   Albumin 2.0 (L) 3.5 - 5.0 g/dL   AST 17 15 - 41 U/L   ALT 24 17 - 63 U/L   Alkaline Phosphatase 57 38 - 126 U/L   Total Bilirubin 1.0 0.3 - 1.2 mg/dL   GFR calc non Af Amer >60 >60 mL/min   GFR calc Af Amer >60 >60 mL/min    Comment: (NOTE) The eGFR has been calculated using the CKD EPI equation. This calculation has not been validated in all clinical situations. eGFR's persistently <60 mL/min signify possible Chronic Kidney Disease.    Anion gap 6 5 - 15    Imaging / Studies: Dg Abd 1 View  12/25/2015  CLINICAL DATA:  Feeding tube placement EXAM: ABDOMEN - 1 VIEW COMPARISON:  None. FINDINGS: Feeding tube has been placed. Tip overlies the level of the descending portion of the duodenum. There is mild patient motion artifact, limiting detail. IMPRESSION: Feeding tube tip overlying the level of the descending portion the duodenum. Electronically Signed   By: Nolon Nations M.D.   On: 12/25/2015 14:41   Dg Chest Port 1 View  12/27/2015  CLINICAL DATA:  Acute respiratory failure. EXAM: PORTABLE CHEST 1 VIEW COMPARISON:  Yesterday. FINDINGS: Endotracheal tube in satisfactory position. Nasogastric tube extending into the stomach. A feeding tube is also seen extending into the distal esophagus, not visualized more distally due to thickness of overlying soft tissues. Left jugular catheter tip in the proximal superior vena cava. Minimal left basilar airspace opacity with improvement and  stable linear density in the right lower lung zone. The heart remains grossly normal in size. Small bilateral calcified granulomata are noted. Unremarkable bones. IMPRESSION: 1. Minimal left basilar atelectasis or pneumonia with improvement. 2. Small amount of linear atelectasis or pleural fluid in the minor fissure in the right lower lung zone. Electronically Signed   By: Claudie Revering M.D.   On: 12/27/2015 07:50   Dg Chest Port 1 View  12/26/2015  CLINICAL DATA:  Pancreatitis EXAM: PORTABLE CHEST 1 VIEW COMPARISON:  12/24/2015 FINDINGS: Bilateral mild interstitial thickening. No pleural effusion or pneumothorax. Relatively low lung volumes. Stable cardiomediastinal silhouette. Endotracheal tube with the tip 4 cm above the carina. Nasogastric tube coursing below the diaphragm. Left IJ central venous catheter with the tip projecting over the confluence of the SVC/left brachiocephalic vein. IMPRESSION: 1. Stable support lines and tubing. 2. Mild pulmonary vascular congestion. Electronically Signed   By: Kathreen Devoid   On: 12/26/2015 09:25    Medications / Allergies:  Scheduled Meds: . antiseptic oral rinse  7 mL Mouth Rinse 10 times per day  . chlorhexidine gluconate (SAGE KIT)  15 mL Mouth Rinse BID  . feeding supplement (VITAL 1.5 CAL)  1,000 mL Per Tube Q24H  . fentaNYL (SUBLIMAZE) injection  50 mcg Intravenous Once  . free water  200 mL Per Tube Q8H  . heparin subcutaneous  5,000 Units Subcutaneous Q8H  . imipenem-cilastatin  500 mg Intravenous Q6H  . insulin aspart  0-20 Units Subcutaneous Q4H  . insulin glargine  20 Units Subcutaneous Q24H  . pantoprazole (PROTONIX) IV  40 mg Intravenous Q24H  . potassium chloride  10 mEq Intravenous Q1 Hr x 4  . sodium chloride flush  10-40 mL Intracatheter Q12H   Continuous Infusions: . dextrose 5 % 1,000 mL with sodium acetate 150 mEq infusion 10 mL/hr at 12/26/15 2000  . fentaNYL infusion INTRAVENOUS 250 mcg/hr (12/27/15 0156)   PRN  Meds:.Place/Maintain arterial line **AND** sodium chloride, acetaminophen (TYLENOL) oral liquid 160 mg/5 mL, fentaNYL, hydrALAZINE, nitroGLYCERIN, ondansetron **OR** ondansetron (ZOFRAN) IV, sodium chloride flush  Antibiotics: Anti-infectives    Start     Dose/Rate Route Frequency Ordered Stop   12/26/15 1530  erythromycin ethylsuccinate (EES) 200 MG/5ML suspension 200 mg     200 mg Per Tube  Once 12/26/15 1520 12/26/15 1646   12/24/15 1200  imipenem-cilastatin (PRIMAXIN) 500 mg in sodium chloride 0.9 % 100 mL IVPB     500 mg 200 mL/hr over 30 Minutes Intravenous Every 6 hours 12/24/15 0913     12/24/15 0930  vancomycin (VANCOCIN) IVPB 1000 mg/200 mL premix  Status:  Discontinued     1,000 mg 200 mL/hr over 60 Minutes Intravenous Every 12 hours 12/24/15 0913 12/26/15 0937   12/23/15 0800  imipenem-cilastatin (PRIMAXIN) 500 mg in sodium chloride 0.9 % 100 mL IVPB  Status:  Discontinued     500 mg 200 mL/hr over 30 Minutes Intravenous Every 8 hours 12/23/15 0705 12/24/15 0913       Assessment/Plan Gallstone pancreatitis, severe  SIRS -improving, will clamp OGT and leave TF at 29m/hr today.  Watch for distention, n/v -likely interval cholecystectomy in 4-6 weeks after he recovers given severity Acute respiratory failure-on vent, weaning ID-primaxain VTE prophylaxis-SCD/heparin  EErby Pian ANP-BC [facility]   12/27/2015 7:55 AM

## 2015-12-27 NOTE — Progress Notes (Signed)
PULMONARY / CRITICAL CARE MEDICINE   Name: Timothy [name] MRN: [id number] DOB: [birth year]    ADMISSION DATE:  12/21/2015 CONSULTATION DATE:  12/24/2015  REFERRING MD:  Dr. Isidoro Donning  CHIEF COMPLAINT:  Pancreatitis and respiratory failure.  SUBJECTIVE:   RN reports pt weaning on PSV.  No acute events overnight.  Pt denies abd pain.  700 ml out of NGT - green.  Na rose to 152   VITAL SIGNS: BP 137/80 mmHg  Pulse 111  Temp(Src) 100 F (37.8 C) (Core (Comment))  Resp 19  Ht 5\' 11"  (1.803 m)  Wt 297 lb 6.4 oz (134.9 kg)  BMI 41.50 kg/m2  SpO2 96%  HEMODYNAMICS: CVP:  [8 mmHg-11 mmHg] 8 mmHg  VENTILATOR SETTINGS: Vent Mode:  [-] PSV;CPAP FiO2 (%):  [40 %] 40 % Set Rate:  [16 bmp] 16 bmp Vt Set:  [600 mL] 600 mL PEEP:  [5 cmH20] 5 cmH20 Pressure Support:  [5 cmH20] 5 cmH20 Plateau Pressure:  [17 cmH20-21 cmH20] 17 cmH20  INTAKE / OUTPUT: I/O last 3 completed shifts: In: 3905.9 [I.V.:2325.9; NG/GT:420; IV Piggyback:1160] Out: 5470 [Urine:4320; Emesis/NG output:1150]  PHYSICAL EXAMINATION: General:  Ill appearing male in NAD Neuro:  Awake, rass 0, appropriate, follows commands HEENT:  Mm moist, no JVD  Cardiovascular:  s1s2 RRR, no m/r/g Lungs: even/non-labored, lungs bilaterally coarse Abdomen:  Distended, tender, +BS, no r/g Musculoskeletal: edema mild Skin: no rashes or lesions  LABS:  BMET  Recent Labs Lab 12/25/15 0425 12/26/15 0442 12/27/15 0422  NA 147* 149* 152*  K 3.9 3.6 3.5  CL 123* 119* 119*  CO2 18* 20* 27  BUN 39* 24* 20  CREATININE 1.46* 1.34* 1.19  GLUCOSE 213* 265* 237*    Electrolytes  Recent Labs Lab 12/25/15 0425 12/26/15 0442 12/27/15 0422  CALCIUM 6.2* 6.8* 6.9*    CBC  Recent Labs Lab 12/25/15 0425 12/26/15 0442 12/27/15 0422  WBC 12.9* 13.5* 9.7  HGB 12.4* 12.0* 11.3*  HCT 38.2* 36.8* 35.2*  PLT 223 252 221    Coag's  Recent Labs Lab 12/21/15 2215  INR 1.14    Sepsis Markers  Recent Labs Lab  12/23/15 1434 12/23/15 1646 12/24/15 1148 12/24/15 1150 12/25/15 0425 12/26/15 0442  LATICACIDVEN 1.5 1.4  --  1.8  --   --   PROCALCITON  --   --  4.16  --  3.65 1.93    ABG  Recent Labs Lab 12/25/15 1030 12/26/15 0320 12/26/15 0445  PHART 7.454* 7.474* 7.448  PCO2ART 23.4* 29.9* 27.4*  PO2ART 71.5* 78.7* 71.0*    Liver Enzymes  Recent Labs Lab 12/25/15 0425 12/26/15 0442 12/27/15 0422  AST 27 19 17   ALT 47 32 24  ALKPHOS 85 62 57  BILITOT 1.3* 1.4* 1.0  ALBUMIN 2.4* 2.2* 2.0*    Cardiac Enzymes  Recent Labs Lab 12/26/15 0622 12/26/15 1114 12/26/15 1709  TROPONINI <0.03 0.03 0.03    Glucose  Recent Labs Lab 12/26/15 0820 12/26/15 1213 12/26/15 1621 12/26/15 1943 12/26/15 2327 12/27/15 0326  GLUCAP 262* 264* 242* 251* 257* 220*    Imaging Dg Chest Port 1 View  12/27/2015  CLINICAL DATA:  Acute respiratory failure. EXAM: PORTABLE CHEST 1 VIEW COMPARISON:  Yesterday. FINDINGS: Endotracheal tube in satisfactory position. Nasogastric tube extending into the stomach. A feeding tube is also seen extending into the distal esophagus, not visualized more distally due to thickness of overlying soft tissues. Left jugular catheter tip in the proximal superior vena cava. Minimal left basilar airspace  opacity with improvement and stable linear density in the right lower lung zone. The heart remains grossly normal in size. Small bilateral calcified granulomata are noted. Unremarkable bones. IMPRESSION: 1. Minimal left basilar atelectasis or pneumonia with improvement. 2. Small amount of linear atelectasis or pleural fluid in the minor fissure in the right lower lung zone. Electronically Signed   By: Beckie Salts M.D.   On: 12/27/2015 07:50     STUDIES:  CTA chest/abd/pelvis 5/23 >> Normal caliber thoracoabdominal aorta without dissection or acute aortic abnormality.  Marked peripancreatic inflammation consistent with pancreatitis.  Peripancreatic fluid tracking in  the anterior para renal space and both pericolic gutters.  Mild gallbladder distention, no calcified stone. Hepatic steatosis. Sequela of granulomatous disease in the chest. ABD u/s 5/23 >>  Cholelithiasis without sonographic evidence for acute cholecystitis or biliary dilatation. Hepatic steatosis. ECHO 5/26 >> LVEF 65-70%, normal wall motion, grade 1 diastolic dysfunction, mildly dilated aortic root  CULTURES: BC x 2 5/23 >>  ANTIBIOTICS: Imipenem 5/24 >> vanc 5/25 - 5/27  SIGNIFICANT EVENTS: 5/22  Admit with acute pancreatitis 5/25  Tx ICU, pressors 5/27  Weaned 2 hours on 8/5 PSV  LINES/TUBES: ETT 5/25 - 5/28 L IJ CVL 5/25>>>  DISCUSSION: 40yo male with hx HTN admitted 5/23 with acute pancreatitis likely r/t gallstones.  Tx ICU 5/25 for worsening SIRS, respiratory distress.    ASSESSMENT / PLAN:  PULMONARY Acute respiratory failure - r/t inflammatory response from severe pancreatitis  ALI? - mild vascular congestion noted on CXR P:   PRVC, 8cc/kg Wean PEEP / FiO2 for sats > 90% Daily SBT / WUA Intermittent CXR  CARDIOVASCULAR SIRS/sepsis - r/t pancreatitis  Shock - resolving  R/o pulm htn affecting cvp P:  ECHO as above ICU monitoring  Hold home lisinopril  PRN hydralazine for SBP > 170   RENAL nonAG metabolic acidosis  Hypernatremia  AKI, ATN P:   Trend BMP / UOP  Ensure adequate renal perfusion / normal MAP Free water Q8 D5w to 40 ml/hr  GASTROINTESTINAL Acute pancreatitis - likely r/t gallstones, ? If he passed a stone, improved enzymes.  [place] without stone, mild distention of gallbladder P:   Surgery following  NPO  Cont abx as above  Leave OGT to LIS >> if extubated, will need small NGT replaced to empty stomach / concern for N/V  Defer further imaging to CCS, suspect will need repeat CT ABD in near future POST pyloric feeding tube in place, continue trickle feeding with vital small peptid formulation S/p erythromycin x1 5/27 in hopes to  move feeding tube further into duodenum  HEMATOLOGIC DVT prevention P:  F/u CBC  SQ heparin   INFECTIOUS Acute pancreatitis  Fever - secondary to above P:   ABX as above  Follow cultures  Narrow abx as able  Trend lactate, pct  PRN tylenol for temp > 101.5  Cooling blanket per pt's request   ENDOCRINE Hyperglycemia - hgbA1c=5.7   P:   Continue SSI CBG Q4  NEUROLOGIC Sedation needs on vent  P:   RASS goal: -1 Fentanyl gtt for pain  Daily WUA / SBT   FAMILY  - Updates:  Pt updated at bedside 5/28.  Wife not present.    - Inter-disciplinary family meet or Palliative Care meeting due by:  Day 7     Canary Brim, NP-C  Pulmonary & Critical Care Pgr: [phone] or if no answer [phone] 12/27/2015, 8:19 AM  PCCM Attending Note: Patient seen and examined with  nurse practitioner. Please refer to her note which I reviewed in detail. Patient has successfully weaned off of vasopressor support. Successfully extubated today with improving respiratory status. Pain seems to be well-controlled at this time and anxiety is his main issue. Tolerating postpyloric tube feedings. Continuing close monitoring in intensive care unit given potential for worsening respiratory status and need for reintubation.  I have spent a total of 31 minutes of critical care time today caring for the patient and reviewing the patient's electronic medical record.  Donna ChristenJennings E. Jamison NeighborNestor, M.D. [facility] Pulmonary & Critical Care Pager:  [phone] After 3pm or if no response, call [phone] 5:39 PM 12/27/2015

## 2015-12-27 NOTE — Progress Notes (Signed)
eLink Physician-Brief Progress Note Patient Name: Timothy CoyQuinten Felty DOB: 1975-12-20 MRN: [id number]   Date of Service  12/27/2015  HPI/Events of Note  Requesting pain/ anxiety   eICU Interventions  Start clonidine/ prn fentanyl     Intervention Category Minor Interventions: Routine modifications to care plan (e.g. PRN medications for pain, fever)  Sandrea HughsMichael Coleman Kalas 12/27/2015, 4:05 PM

## 2015-12-27 NOTE — Progress Notes (Signed)
eLink Physician-Brief Progress Note Patient Name: Timothy [name] DOB: [birth year] MRN: [id number]   Date of Service  12/27/2015  HPI/Events of Note  Requesting restart for tyl pm/ tol extubation fine  eICU Interventions  tyl pm prn      Intervention Category Minor Interventions: Routine modifications to care plan (e.g. PRN medications for pain, fever)  Sandrea HughsMichael Milt Coye 12/27/2015, 10:48 PM

## 2015-12-27 NOTE — Procedures (Signed)
Extubation Procedure Note  Patient Details:   Name: Timothy [name] DOB: 20-Mar-1976 MRN: [id number]   Airway Documentation:  Airway 7.5 mm (Active)  Secured at (cm) 24 cm 12/27/2015  7:46 AM  Measured From Lips 12/27/2015  7:46 AM  Secured Location Right 12/27/2015  7:46 AM  Secured By [employer] Tube Holder 12/27/2015  7:46 AM  Tube Holder Repositioned Yes 12/27/2015  7:46 AM  Cuff Pressure (cm H2O) 24 cm H2O 12/27/2015 12:40 AM  Site Condition Dry 12/27/2015  7:46 AM    Evaluation  O2 sats: stable throughout Complications: No apparent complications Patient did tolerate procedure well. Bilateral Breath Sounds: Clear, Diminished   Yes. Extrubated per orders to [place].  Vital signs stable  [name] V 12/27/2015, 11:36 AM

## 2015-12-28 ENCOUNTER — Inpatient Hospital Stay (HOSPITAL_COMMUNITY): Payer: Federal, State, Local not specified - PPO

## 2015-12-28 DIAGNOSIS — E870 Hyperosmolality and hypernatremia: Secondary | ICD-10-CM

## 2015-12-28 LAB — GLUCOSE, CAPILLARY
Glucose-Capillary: 187 mg/dL — ABNORMAL HIGH (ref 65–99)
Glucose-Capillary: 229 mg/dL — ABNORMAL HIGH (ref 65–99)
Glucose-Capillary: 223 mg/dL — ABNORMAL HIGH (ref 65–99)
Glucose-Capillary: 223 mg/dL — ABNORMAL HIGH (ref 65–99)
Glucose-Capillary: 225 mg/dL — ABNORMAL HIGH (ref 65–99)

## 2015-12-28 LAB — COMPREHENSIVE METABOLIC PANEL
ALT: 24 U/L (ref 17–63)
AST: 23 U/L (ref 15–41)
Albumin: 2.2 g/dL — ABNORMAL LOW (ref 3.5–5.0)
Alkaline Phosphatase: 65 U/L (ref 38–126)
Anion gap: 6 (ref 5–15)
BUN: 19 mg/dL (ref 6–20)
CO2: 28 mmol/L (ref 22–32)
Calcium: 7.5 mg/dL — ABNORMAL LOW (ref 8.9–10.3)
Chloride: 119 mmol/L — ABNORMAL HIGH (ref 101–111)
Creatinine, Ser: 1.19 mg/dL (ref 0.61–1.24)
GFR calc Af Amer: >60 mL/min (ref >60)
GFR calc non Af Amer: >60 mL/min (ref >60)
Glucose, Bld: 225 mg/dL — ABNORMAL HIGH (ref 65–99)
Potassium: 3.4 mmol/L — ABNORMAL LOW (ref 3.5–5.1)
Sodium: 153 mmol/L — ABNORMAL HIGH (ref 135–145)
Total Bilirubin: 0.9 mg/dL (ref 0.3–1.2)
Total Protein: 5.3 g/dL — ABNORMAL LOW (ref 6.5–8.1)

## 2015-12-28 MED ORDER — INSULIN GLARGINE 100 UNIT/ML ~~LOC~~ SOLN
30.0000 [IU] | SUBCUTANEOUS | Status: DC
  Administered 2015-12-28: 30 [IU] via SUBCUTANEOUS
  Filled 2015-12-28 (×2): qty 0.3

## 2015-12-28 MED ORDER — POTASSIUM CHLORIDE 10 MEQ/50ML IV SOLN
10.0000 meq | INTRAVENOUS | Status: AC
Start: 2015-12-28 — End: 2015-12-28
  Administered 2015-12-28 (×4): 10 meq via INTRAVENOUS
  Filled 2015-12-28 (×2): qty 50

## 2015-12-28 MED ORDER — DEXTROSE 5 % IV SOLN
INTRAVENOUS | Status: DC
  Administered 2015-12-28 – 2015-12-31 (×5): via INTRAVENOUS

## 2015-12-28 MED ORDER — HYDRALAZINE HCL 20 MG/ML IJ SOLN
10.0000 mg | Freq: Four times a day (QID) | INTRAMUSCULAR | Status: DC
  Administered 2015-12-28 – 2015-12-30 (×12): 10 mg via INTRAVENOUS
  Filled 2015-12-28 (×12): qty 1

## 2015-12-28 MED ORDER — CLONIDINE HCL 0.1 MG PO TABS
0.1000 mg | ORAL_TABLET | Freq: Once | ORAL | Status: AC
  Administered 2015-12-28: 0.1 mg via ORAL
  Filled 2015-12-28: qty 1

## 2015-12-28 MED ORDER — LORAZEPAM 2 MG/ML IJ SOLN
0.5000 mg | INTRAMUSCULAR | Status: DC | PRN
  Administered 2015-12-28 (×3): 1 mg via INTRAVENOUS
  Filled 2015-12-28 (×3): qty 1

## 2015-12-28 NOTE — Progress Notes (Signed)
eLink Physician-Brief Progress Note Patient Name: Timothy CoyQuinten [name] DOB: March 04, 1976 MRN: [id number]   Date of Service  12/28/2015  HPI/Events of Note  Pt pulled out NG and post pyloric tube.  eICU Interventions  Replace NG tube. Cortrak to be placed in AM     Intervention Category Evaluation Type: Other  Criston Chancellor 12/28/2015, 7:18 PM

## 2015-12-28 NOTE — Progress Notes (Signed)
Dr. Richarda OverlieSomners called and updated on patient's most recent blood pressure. Clonidine did not lower blood pressure at this time. Verbal orders to give q4 prn order of Hydralazine now.

## 2015-12-28 NOTE — Progress Notes (Signed)
PULMONARY / CRITICAL CARE MEDICINE   Name: Timothy [name] MRN: [id number] DOB: 1976/02/02    ADMISSION DATE:  12/21/2015 CONSULTATION DATE:  12/24/2015  REFERRING MD:  Dr. Isidoro Donningai  CHIEF COMPLAINT:  Pancreatitis and respiratory failure.  SUBJECTIVE:   Pt reports significant anxiety.  He typically sees a therapist for anxiety and is not medicated, feels situational anxiety is "pretty high".  RN reports no acute events.  BP remains elevated, wife feels it is related to anxiety.  1200 ml out of NGT.  Pt reports less abd pain, fever broke.  Denies nausea/vomiting.   VITAL SIGNS: BP 182/96 mmHg  Pulse 126  Temp(Src) 100.2 F (37.9 C) (Core (Comment))  Resp 30  Ht 5\' 11"  (1.803 m)  Wt 291 lb 10.7 oz (132.3 kg)  BMI 40.70 kg/m2  SpO2 93%  HEMODYNAMICS: CVP:  [5 mmHg-11 mmHg] 6 mmHg  VENTILATOR SETTINGS:    INTAKE / OUTPUT: I/O last 3 completed shifts: In: 3415.9 [I.V.:1368.9; NG/GT:1137; IV Piggyback:910] Out: 4585 [Urine:2685; Emesis/NG output:1900]  PHYSICAL EXAMINATION: General:  Ill appearing male in NAD PSY:  Calm, stated anxiety Neuro:  Awake, appropriate, follows commands HEENT:  Mm moist, no JVD  Cardiovascular:  s1s2 RRR, no m/r/g Lungs: even/non-labored, lungs bilaterally coarse Abdomen:  Distended, mild tenderness to palpation, +BS, no r/g Musculoskeletal: edema mild Skin: no rashes or lesions  LABS:  BMET  Recent Labs Lab 12/26/15 0442 12/27/15 0422 12/28/15 0425  NA 149* 152* 153*  K 3.6 3.5 3.4*  CL 119* 119* 119*  CO2 20* 27 28  BUN 24* 20 19  CREATININE 1.34* 1.19 1.19  GLUCOSE 265* 237* 225*    Electrolytes  Recent Labs Lab 12/26/15 0442 12/27/15 0422 12/28/15 0425  CALCIUM 6.8* 6.9* 7.5*    CBC  Recent Labs Lab 12/25/15 0425 12/26/15 0442 12/27/15 0422  WBC 12.9* 13.5* 9.7  HGB 12.4* 12.0* 11.3*  HCT 38.2* 36.8* 35.2*  PLT 223 252 221    Coag's  Recent Labs Lab 12/21/15 2215  INR 1.14    Sepsis Markers  Recent  Labs Lab 12/23/15 1434 12/23/15 1646 12/24/15 1148 12/24/15 1150 12/25/15 0425 12/26/15 0442  LATICACIDVEN 1.5 1.4  --  1.8  --   --   PROCALCITON  --   --  4.16  --  3.65 1.93    ABG  Recent Labs Lab 12/25/15 1030 12/26/15 0320 12/26/15 0445  PHART 7.454* 7.474* 7.448  PCO2ART 23.4* 29.9* 27.4*  PO2ART 71.5* 78.7* 71.0*    Liver Enzymes  Recent Labs Lab 12/26/15 0442 12/27/15 0422 12/28/15 0425  AST 19 17 23   ALT 32 24 24  ALKPHOS 62 57 65  BILITOT 1.4* 1.0 0.9  ALBUMIN 2.2* 2.0* 2.2*    Cardiac Enzymes  Recent Labs Lab 12/26/15 0622 12/26/15 1114 12/26/15 1709  TROPONINI <0.03 0.03 0.03    Glucose  Recent Labs Lab 12/27/15 0815 12/27/15 1154 12/27/15 1545 12/27/15 1943 12/27/15 2330 12/28/15 0314  GLUCAP 218* 190* 239* 275* 221* 187*    Imaging No results found.   STUDIES:  CTA chest/abd/pelvis 5/23 >> Normal caliber thoracoabdominal aorta without dissection or acute aortic abnormality.  Marked peripancreatic inflammation consistent with pancreatitis.  Peripancreatic fluid tracking in the anterior para renal space and both pericolic gutters.  Mild gallbladder distention, no calcified stone. Hepatic steatosis. Sequela of granulomatous disease in the chest. ABD u/s 5/23 >>  Cholelithiasis without sonographic evidence for acute cholecystitis or biliary dilatation. Hepatic steatosis. ECHO 5/26 >> LVEF 65-70%, normal wall  motion, grade 1 diastolic dysfunction, mildly dilated aortic root  CULTURES: BC x 2 5/23 >> neg  ANTIBIOTICS: Imipenem 5/24 >>  vanc 5/25 - 5/27  SIGNIFICANT EVENTS: 5/22  Admit with acute pancreatitis 5/25  Tx ICU, pressors 5/27  Weaned 2 hours on 8/5 PSV  LINES/TUBES: ETT 5/25 - 5/28 L IJ CVL 5/25>>>  DISCUSSION: 40 y/o male with hx HTN admitted 5/23 with acute pancreatitis likely r/t gallstones.  Tx ICU 5/25 for worsening SIRS, respiratory distress.    ASSESSMENT / PLAN:  PULMONARY Acute respiratory failure  - r/t inflammatory response from severe pancreatitis  Mild vascular congestion noted on CXR P:   Oxygen for saturations > 92% Pulmonary hygiene - IS, mobilize as able Intermittent CXR  CARDIOVASCULAR SIRS/sepsis - r/t pancreatitis  Shock - resolved HTN P:  ECHO as above ICU monitoring  Hold home lisinopril  Add hydralazine 10 mg QID scheduled  Clonidine 0.1 mg Q8 PRN hydralazine for SBP > 170   RENAL nonAG metabolic acidosis  Hypernatremia - secondary to acetate AKI, ATN P:   Trend BMP / UOP  Ensure adequate renal perfusion / normal MAP Free water Q8 D5w @ 50 ml/hr  GASTROINTESTINAL Acute pancreatitis - likely r/t gallstones, ? If he passed a stone, improved enzymes.  [place] without stone, mild distention of gallbladder P:   Surgery following  NPO  Cont abx as above  Leave gastric tube to LIS  Defer further imaging to CCS, suspect will need repeat CT ABD in near future POST pyloric feeding tube in place, continue trickle feeding with vital small peptid formulation  HEMATOLOGIC DVT prevention P:  F/u CBC  SQ heparin   INFECTIOUS Acute pancreatitis  Fever - secondary to above P:   ABX as above  Follow cultures  Narrow abx as able  Trend lactate, pct  PRN tylenol for temp > 101.5   ENDOCRINE Hyperglycemia - hgbA1c=5.7   P:   Continue SSI CBG Q4 Lantus to 30 units QD  NEUROLOGIC Sedation needs on vent - resolved Anxiety - sees a therapist as an outpatient, managed off medications / with therapy P:   RASS goal: n/a PRN benadryl for sleep PRN fentanyl for pain    FAMILY  - Updates:  Pt and wife updated at bedside 5/29   - Inter-disciplinary family meet or Palliative Care meeting due by:  Day 7     Canary Brim, NP-C [place] Pulmonary & Critical Care Pgr: [phone] or if no answer [phone] 12/28/2015, 8:06 AM  PCCM Attending Note: Patient seen and examined with nurse practitioner. Please refer to her note which I reviewed in detail. Patient  has successfully weaned off of vasopressor support. Continue to monitor respiratory status post extubation as he remains slightly tenuous. Adding free water for hypernatremia and hyperchloremia. Pancreatitis clinically improving and tolerating tube feedings. Obtaining abdominal x-ray to ensure post pyloric tube placement. Anxiety significantly improved on current regimen.  I have spent a total of 33 minutes of critical care time today caring for the patient, updating wife at bedside, and reviewing the patient's electronic medical record.  Donna Christen Jamison Neighbor, M.D. [facility] Pulmonary & Critical Care Pager:  [phone] After 3pm or if no response, call [phone] 4:57 PM 12/28/2015

## 2015-12-28 NOTE — Evaluation (Signed)
Physical Therapy Evaluation Patient Details Name: Timothy [name] Deshpande MRN: [id number] DOB: Jun 19, 1976 Today's Date: 12/28/2015   History of Present Illness  Pt is a 40 yo male admitted for pancreatitis. PMH: lob back surgery in 2007.  Clinical Impression  Pt admitted with above diagnosis. Pt currently with functional limitations due to the deficits listed below (see PT Problem List). Pt very deconditioned but motivated. Pt with increased anxiety but motivated to start moving. Pt will benefit from skilled PT to increase their independence and safety with mobility to allow discharge to the venue listed below.       Follow Up Recommendations Home health PT;Supervision/Assistance - 24 hour    Equipment Recommendations  Rolling walker with 5" wheels    Recommendations for Other Services       Precautions / Restrictions Precautions Precautions: Fall Precaution Comments: has been in bed for 7 days Restrictions Weight Bearing Restrictions: No      Mobility  Bed Mobility Overal bed mobility: Needs Assistance Bed Mobility: Supine to Sit     Supine to sit: Mod assist;HOB elevated     General bed mobility comments: used bed rail, assist for trunk elevation  Transfers Overall transfer level: Needs assistance Equipment used: 2 person hand held assist Transfers: Sit to/from [employer] CorporationStand;Stand Pivot Transfers Sit to Stand: Mod assist;Min assist Stand pivot transfers: Mod assist;+2 physical assistance;+2 safety/equipment       General transfer comment: pt unsteady, wide base of support, mild anxiety,   Ambulation/Gait             General Gait Details: took 3 steps to chair  Stairs            Wheelchair Mobility    Modified Rankin (Stroke Patients Only)       Balance Overall balance assessment: Needs assistance   Sitting balance-Leahy Scale: Good     Standing balance support: Bilateral upper extremity supported Standing balance-Leahy Scale: Poor Standing balance  comment: pt weak from prolonged bed rest                             Pertinent Vitals/Pain Pain Assessment: No/denies pain    Home Living Family/patient expects to be discharged to:: Private residence Living Arrangements: Spouse/significant other;Children Available Help at Discharge: Family;Available 24 hours/day (wife does work 2 nights a week) Type of Home: House Home Access: Stairs to enter Entrance Stairs-Rails: Right Secretary/administratorntrance Stairs-Number of Steps: 2 Home Layout: Two level Home Equipment: None      Prior Function Level of Independence: Independent         Comments: was working, amb without AD     Hand Dominance   Dominant Hand: Right    Extremity/Trunk Assessment   Upper Extremity Assessment: Generalized weakness           Lower Extremity Assessment: Generalized weakness      Cervical / Trunk Assessment: Normal  Communication   Communication: No difficulties (soft spoken from intubation)  Cognition Arousal/Alertness: Awake/alert Behavior During Therapy: WFL for tasks assessed/performed Overall Cognitive Status: Within Functional Limits for tasks assessed                      General Comments      Exercises General Exercises - Lower Extremity Long Arc Quad: AROM;Both;5 reps;Seated      Assessment/Plan    PT Assessment Patient needs continued PT services  PT Diagnosis Difficulty walking;Generalized weakness   PT Problem List Decreased  strength;Decreased range of motion;Decreased mobility;Decreased coordination;Decreased balance;Decreased activity tolerance  PT Treatment Interventions DME instruction;Gait training;Stair training;Functional mobility training;Therapeutic activities;Therapeutic exercise;Balance training   PT Goals (Current goals can be found in the Care Plan section) Acute Rehab PT Goals Patient Stated Goal: move PT Goal Formulation: With patient Time For Goal Achievement: 01/04/16 Potential to Achieve Goals:  Good    Frequency Min 3X/week   Barriers to discharge        Co-evaluation               End of Session Equipment Utilized During Treatment: Gait belt Activity Tolerance: No increased pain;Patient limited by fatigue Patient left: in chair;with call bell/phone within reach;with nursing/sitter in room Nurse Communication: Mobility status (present for transfer)         Time: 1544-1610 PT Time Calculation (min) (ACUTE ONLY): 26 min   Charges:   PT Evaluation $PT Eval Moderate Complexity: 1 Procedure PT Treatments $Therapeutic Activity: 8-22 mins   PT G Codes:        Marcene Brawn 12/28/2015, 4:21 PM  Lewis Shock, PT, DPT Pager #: [phone] Office #: [phone]

## 2015-12-28 NOTE — Progress Notes (Signed)
WITH MOBILIZATION, PTS CORTRAK OPENED AND BILE RAN OUT.  ASPIRATED 80ML BILE.  NOTIFIED DR. Jamison NeighborNESTOR WHO ORDERED KUB TO CHECK PLACEMENT.  TF ON HOLD AT THIS TIME.

## 2015-12-28 NOTE — Progress Notes (Signed)
eLink Physician-Brief Progress Note Patient Name: Timothy [name] DOB: March 22, 1976 MRN: [id number]   Date of Service  12/28/2015  HPI/Events of Note  Hypertension - BP = 180/73.  eICU Interventions  Will give an extra dose of Clonidine 0.1 mg PO now.      Intervention Category Intermediate Interventions: Hypertension - evaluation and management  Sommer,Steven Eugene 12/28/2015, 5:15 AM

## 2015-12-28 NOTE — Progress Notes (Signed)
Per MD, orders to advance new NG 10cc was given. NG tube advanced and STAT XRAY was ordered. Also, patient voided using urinal. 525cc output was noted. Will not insert Foley at this time.

## 2015-12-28 NOTE — Progress Notes (Signed)
Dr. Isaiah SergeMannam gave orders to use NG tube per XRAY placement verification.

## 2015-12-28 NOTE — Progress Notes (Signed)
Patient pulled out NG and CoreTrac. Elink called and notified. Orders to replace NG tube was given. CoreTrac placement will be reassessed in AM.

## 2015-12-28 NOTE — Progress Notes (Signed)
Pharmacy Antibiotic Note  Timothy [name] is a 40 y.o. male admitted on 12/21/2015 with acute pancreatitis.  Pharmacy has been consulted for imipenem dosing.  Plan: Continue imipenem 500 mg IV q6h Monitor cultures, renal function, LOT  Height: 5\' 11"  (180.3 cm) Weight: 291 lb 10.7 oz (132.3 kg) IBW/kg (Calculated) : 75.3  Temp (24hrs), Avg:100.3 F (37.9 C), Min:99.3 F (37.4 C), Max:100.9 F (38.3 C)   Recent Labs Lab 12/23/15 0435 12/23/15 0710  12/23/15 1434 12/23/15 1646  12/24/15 0757 12/24/15 1149 12/24/15 1150 12/25/15 0425 12/26/15 0442 12/27/15 0422 12/28/15 0425  WBC 17.5*  --   --   --   --   --  16.9*  --   --  12.9* 13.5* 9.7  --   CREATININE 3.12*  --   < > 2.40*  --   < >  --  1.31*  --  1.46* 1.34* 1.19 1.19  LATICACIDVEN 4.2* 2.6*  --  1.5 1.4  --   --   --  1.8  --   --   --   --   < > = values in this interval not displayed.  Estimated Creatinine Clearance: 113.4 mL/min (by C-G formula based on Cr of 1.19).    No Known Allergies  Antimicrobials this admission: Imipenem 5/24 >>  Vanc 5/25 >> 5/27  Microbiology results: 5/23 MRSA PCR - negative 5/23 BCx x 2: NGTD  Thank you for allowing pharmacy to be a part of this patient's care.  Timothy [name], PharmD. PGY-1 Pharmacy Resident Pager: [phone] 12/28/2015 8:30 AM

## 2015-12-28 NOTE — Progress Notes (Signed)
eLink Physician-Brief Progress Note Patient Name: Timothy CoyQuinten [name] DOB: 07-12-1976 MRN: [id number]   Date of Service  12/28/2015  HPI/Events of Note  1) No urine output since foleys was removed. > 300 cc on bladder scan. 2) Asked to review NG placement  eICU Interventions  1) Reinsert foleys 2) Advance NG tube by 10 cm and repeat AXR.     Intervention Category Intermediate Interventions: Other:  Timothy [name] 12/28/2015, 9:38 PM

## 2015-12-28 NOTE — Progress Notes (Signed)
Patient is due to void and has had failed attempts. Elink called and notified. Orders to replace Foley was given.

## 2015-12-29 ENCOUNTER — Inpatient Hospital Stay (HOSPITAL_COMMUNITY): Payer: Federal, State, Local not specified - PPO

## 2015-12-29 DIAGNOSIS — R410 Disorientation, unspecified: Secondary | ICD-10-CM

## 2015-12-29 LAB — BASIC METABOLIC PANEL
Anion gap: 6 (ref 5–15)
BUN: 21 mg/dL — ABNORMAL HIGH (ref 6–20)
CO2: 28 mmol/L (ref 22–32)
Calcium: 7.9 mg/dL — ABNORMAL LOW (ref 8.9–10.3)
Chloride: 118 mmol/L — ABNORMAL HIGH (ref 101–111)
Creatinine, Ser: 1.19 mg/dL (ref 0.61–1.24)
GFR calc Af Amer: >60 mL/min (ref >60)
GFR calc non Af Amer: >60 mL/min (ref >60)
Glucose, Bld: 205 mg/dL — ABNORMAL HIGH (ref 65–99)
Potassium: 3.3 mmol/L — ABNORMAL LOW (ref 3.5–5.1)
Sodium: 152 mmol/L — ABNORMAL HIGH (ref 135–145)

## 2015-12-29 LAB — POCT I-STAT 3, ART BLOOD GAS (G3+)
Acid-Base Excess: 4 mmol/L — ABNORMAL HIGH (ref 0.0–2.0)
Bicarbonate: 26.5 mEq/L — ABNORMAL HIGH (ref 20.0–24.0)
O2 Saturation: 95 %
Patient temperature: 99.8
TCO2: 27 mmol/L (ref 0–100)
pCO2 arterial: 33.9 mmHg — ABNORMAL LOW (ref 35.0–45.0)
pH, Arterial: 7.504 — ABNORMAL HIGH (ref 7.350–7.450)
pO2, Arterial: 71 mmHg — ABNORMAL LOW (ref 80.0–100.0)

## 2015-12-29 LAB — GLUCOSE, CAPILLARY
Glucose-Capillary: 184 mg/dL — ABNORMAL HIGH (ref 65–99)
Glucose-Capillary: 194 mg/dL — ABNORMAL HIGH (ref 65–99)
Glucose-Capillary: 199 mg/dL — ABNORMAL HIGH (ref 65–99)
Glucose-Capillary: 236 mg/dL — ABNORMAL HIGH (ref 65–99)
Glucose-Capillary: 248 mg/dL — ABNORMAL HIGH (ref 65–99)
Glucose-Capillary: 257 mg/dL — ABNORMAL HIGH (ref 65–99)

## 2015-12-29 MED ORDER — POTASSIUM CHLORIDE 20 MEQ/15ML (10%) PO SOLN: 40.0000 meq | Freq: Once | ORAL | Status: DC

## 2015-12-29 MED ORDER — POTASSIUM CHLORIDE 10 MEQ/50ML IV SOLN
10.0000 meq | INTRAVENOUS | Status: AC
  Administered 2015-12-29 (×4): 10 meq via INTRAVENOUS
  Filled 2015-12-29 (×4): qty 50

## 2015-12-29 MED ORDER — INSULIN GLARGINE 100 UNIT/ML ~~LOC~~ SOLN
35.0000 [IU] | SUBCUTANEOUS | Status: DC
  Filled 2015-12-29: qty 0.35

## 2015-12-29 MED ORDER — IOPAMIDOL (ISOVUE-300) INJECTION 61%
INTRAVENOUS | Status: AC
  Filled 2015-12-29: qty 100

## 2015-12-29 MED ORDER — DIATRIZOATE MEGLUMINE & SODIUM 66-10 % PO SOLN
ORAL | Status: AC
  Filled 2015-12-29: qty 30

## 2015-12-29 MED ORDER — METOPROLOL TARTRATE 5 MG/5ML IV SOLN
5.0000 mg | Freq: Three times a day (TID) | INTRAVENOUS | Status: DC
  Administered 2015-12-29 – 2015-12-31 (×6): 5 mg via INTRAVENOUS
  Filled 2015-12-29 (×6): qty 5

## 2015-12-29 NOTE — Progress Notes (Signed)
Dr. Richarda OverlieSomners confirmed placement of NG tube. Orders to resume intermittent suction were given.

## 2015-12-29 NOTE — Progress Notes (Signed)
eLink Physician-Brief Progress Note Patient Name: Timothy [name] DOB: [birth year] MRN: [id number]   Date of Service  12/29/2015  HPI/Events of Note  Multiple issues: 1. Confusion - Blood glucose = 257. Currently on Versed IV and Fentanyl IV PRN and 2. RR = 40 intermittently.  eICU Interventions  Will order: 1. ABG now. 2. Please hold Fentanyl IV and Versed IV until mental status is improved.      Intervention Category Major Interventions: Delirium, psychosis, severe agitation - evaluation and management  [name] 12/29/2015, 12:56 AM

## 2015-12-29 NOTE — Progress Notes (Signed)
Patient intermittently confused. Respirations in the 40s. Dr. Richarda OverlieSomners called and notified. Verbal orders to hold Ativan prn and Fentanyl prn until mental status improves. ABG was ordered. Patient requesting wife to be at bedside. Wife, Glennon MacBetsey, called and updated.

## 2015-12-29 NOTE — Progress Notes (Signed)
PULMONARY / CRITICAL CARE MEDICINE   Name: Timothy [name] MRN: [id number] DOB: 02-11-1976    ADMISSION DATE:  12/21/2015 CONSULTATION DATE:  12/24/2015  REFERRING MD:  Dr. Isidoro Donning  CHIEF COMPLAINT:  Pancreatitis and respiratory failure.  SUBJECTIVE:  Confusion overnight, pulled out NGT and post-pyloric tube. Feeling better overall, but he is frustrated with being in the hospital. NGT to LIS with bilious drainage.  VITAL SIGNS: BP 176/111 mmHg  Pulse 124  Temp(Src) 99.8 F (37.7 C) (Oral)  Resp 39  Ht 5\' 11"  (1.803 m)  Wt 132.3 kg (291 lb 10.7 oz)  BMI 40.70 kg/m2  SpO2 94%  HEMODYNAMICS: CVP:  [6 mmHg-13 mmHg] 6 mmHg  VENTILATOR SETTINGS:    INTAKE / OUTPUT: I/O last 3 completed shifts: In: 3218.3 [I.V.:1613.3; NG/GT:905; IV Piggyback:700] Out: 4130 [Urine:2050; Emesis/NG output:2080]  PHYSICAL EXAMINATION:  General:  Acutely ill appearing, morbidly obese male in NAD PSY:  Calm, stated anxiety Neuro:  Awake, appropriate, follows commands HEENT:  Mm moist, no JVD  Cardiovascular:  s1s2 RRR, no m/r/g Lungs: even/non-labored, lungs scant rhonchi Abdomen:  Distended, mild tenderness to palpation, +BS, no r/g Musculoskeletal: edema mild Skin: no rashes or lesions  LABS:  BMET  Recent Labs Lab 12/27/15 0422 12/28/15 0425 12/29/15 0430  NA 152* 153* 152*  K 3.5 3.4* 3.3*  CL 119* 119* 118*  CO2 27 28 28   BUN 20 19 21*  CREATININE 1.19 1.19 1.19  GLUCOSE 237* 225* 205*    Electrolytes  Recent Labs Lab 12/27/15 0422 12/28/15 0425 12/29/15 0430  CALCIUM 6.9* 7.5* 7.9*    CBC  Recent Labs Lab 12/25/15 0425 12/26/15 0442 12/27/15 0422  WBC 12.9* 13.5* 9.7  HGB 12.4* 12.0* 11.3*  HCT 38.2* 36.8* 35.2*  PLT 223 252 221    Coag's No results for input(s): APTT, INR in the last 168 hours.  Sepsis Markers  Recent Labs Lab 12/23/15 1434 12/23/15 1646 12/24/15 1148 12/24/15 1150 12/25/15 0425 12/26/15 0442  LATICACIDVEN 1.5 1.4  --  1.8  --    --   PROCALCITON  --   --  4.16  --  3.65 1.93    ABG  Recent Labs Lab 12/26/15 0320 12/26/15 0445 12/29/15 0121  PHART 7.474* 7.448 7.504*  PCO2ART 29.9* 27.4* 33.9*  PO2ART 78.7* 71.0* 71.0*    Liver Enzymes  Recent Labs Lab 12/26/15 0442 12/27/15 0422 12/28/15 0425  AST 19 17 23   ALT 32 24 24  ALKPHOS 62 57 65  BILITOT 1.4* 1.0 0.9  ALBUMIN 2.2* 2.0* 2.2*    Cardiac Enzymes  Recent Labs Lab 12/26/15 0622 12/26/15 1114 12/26/15 1709  TROPONINI <0.03 0.03 0.03    Glucose  Recent Labs Lab 12/28/15 1142 12/28/15 1528 12/28/15 1929 12/29/15 0028 12/29/15 0405 12/29/15 0828  GLUCAP 223* 223* 225* 257* 194* 184*    Imaging Dg Chest Port 1 View  12/29/2015  CLINICAL DATA:  Respiratory failure. EXAM: PORTABLE CHEST 1 VIEW COMPARISON:  11/27/2015. FINDINGS: Endotracheal tube tip not definitely identified over the trachea. Clinical correlation suggested . Following discussion of this finding with the patient's nurse I was informed that the patient has been extubated. Tubing over the upper chest may be related oxygen therapy . NG tube noted below left hemidiaphragm. Left IJ line in stable position . Heart size stable. Low lung volumes with bibasilar atelectasis. Left lower lobe infiltrate cannot be excluded. Stable left upper lobe calcified nodule consistent with granuloma. No pleural effusion or pneumothorax . IMPRESSION: 1. Endotracheal  tube tip not definitely identified over the trachea. Clinical correlation suggested. Following discussion of this finding with the patient's nurse I was informed that the patient has been extubated. Tubing over the upper chest may be related to oxygen therapy. NG tube and left IJ line in stable position. 2. Low lung volumes with bibasilar atelectasis. Left lower lobe infiltrate cannot be excluded. Critical Value/emergent results were called by telephone at the time of interpretation on 12/29/2015 at 6:57 am to the patient's nurse who  verbally acknowledged these results. Electronically Signed   By: Maisie Fus  Register   On: 12/29/2015 07:02   Dg Abd Portable 1v  12/29/2015  CLINICAL DATA:  NG tube placement. EXAM: PORTABLE ABDOMEN - 1 VIEW COMPARISON:  Yesterday at 2216 hour FINDINGS: Tip and side port of the enteric tube projects over the expected location of the stomach. Mild gaseous distention of large and small bowel loops in the upper abdomen. IMPRESSION: Tip and side port of the enteric tube project over the expected location of the stomach. Electronically Signed   By: Rubye Oaks M.D.   On: 12/29/2015 04:21   Dg Abd Portable 1v  12/28/2015  CLINICAL DATA:  Nasogastric tube placement.  Initial encounter. EXAM: PORTABLE ABDOMEN - 1 VIEW COMPARISON:  Abdominal radiograph performed earlier today at 9:13 p.m. FINDINGS: The patient's enteric tube is seen ending overlying the antrum of the stomach. The visualized bowel gas pattern is grossly unremarkable, with scattered air filled loops of small bowel seen. No free intra-abdominal air is identified, though evaluation for free air is limited on a single supine view. The visualized lung bases are grossly clear. No acute osseous abnormalities are seen. IMPRESSION: Enteric tube seen ending overlying the antrum of the stomach. Electronically Signed   By: Roanna Raider M.D.   On: 12/28/2015 22:32   Dg Abd Portable 1v  12/28/2015  CLINICAL DATA:  Encounter for nasogastric tube placement. EXAM: PORTABLE ABDOMEN - 1 VIEW COMPARISON:  Earlier this day at 1647 hour FINDINGS: The previous weighted enteric tube is no longer seen. A new enteric tube is seen with its tip in the region of distal esophagus, only faintly visualized due to habitus in overlying monitoring devices. Recommend advancement. IMPRESSION: Tip of the enteric tube in the distal esophagus. Recommend advancement of at least 10 cm. Electronically Signed   By: Rubye Oaks M.D.   On: 12/28/2015 21:24   Dg Abd Portable  1v  12/28/2015  CLINICAL DATA:  40 year old male post feeding tube placement. Subsequent encounter. EXAM: PORTABLE ABDOMEN - 1 VIEW COMPARISON:  12/25/2015. FINDINGS: Feeding tube tip at the expected level of the distal duodenum/ligament of Treitz. Gas-filled prominent size transverse colon. No gross free intraperitoneal narrow although evaluation limited by motion. Remainder of exam limited. IMPRESSION: Feeding tube tip at the expected level of the distal duodenum/ligament of Treitz. Electronically Signed   By: Lacy Duverney M.D.   On: 12/28/2015 17:41     STUDIES:  CTA chest/abd/pelvis 5/23 >> Normal caliber thoracoabdominal aorta without dissection or acute aortic abnormality.  Marked peripancreatic inflammation consistent with pancreatitis.  Peripancreatic fluid tracking in the anterior para renal space and both pericolic gutters.  Mild gallbladder distention, no calcified stone. Hepatic steatosis. Sequela of granulomatous disease in the chest. ABD u/s 5/23 >>  Cholelithiasis without sonographic evidence for acute cholecystitis or biliary dilatation. Hepatic steatosis. ECHO 5/26 >> LVEF 65-70%, normal wall motion, grade 1 diastolic dysfunction, mildly dilated aortic root  CULTURES: BC x 2 5/23 >> neg  ANTIBIOTICS: Imipenem 5/24 >>  vanc 5/25 - 5/27   SIGNIFICANT EVENTS: 5/22  Admit with acute pancreatitis 5/25  Tx ICU, pressors 5/27  Weaned 2 hours on 8/5 PSV 5/28 extubated  LINES/TUBES: ETT 5/25 - 5/28 L IJ CVL 5/25>>>  DISCUSSION: 40 y/o male with hx HTN admitted 5/23 with acute pancreatitis likely r/t gallstones.  Tx ICU 5/25 for worsening SIRS, respiratory distress required intubation. Extubated 5/28 with some delirium.   ASSESSMENT / PLAN:  PULMONARY Acute respiratory failure - r/t inflammatory response from severe pancreatitis  Mild vascular congestion noted on CXR P:   Oxygen for saturations > 92% Pulmonary hygiene - IS, mobilize as able Intermittent  CXR  CARDIOVASCULAR SIRS/sepsis - r/t pancreatitis  Shock - resolved HTN P:  Telemetry monitoring Hold home lisinopril  Add scheduled IV metoprolol 5mg  TID Hydralazine 10 mg QID scheduled  Clonidine 0.1 mg Q8 PRN hydralazine for SBP > 170   RENAL Hypernatremia - secondary to acetate Urinary retention s/p foley removal AKI, ATN P:   Trend BMP / UOP  Ensure adequate renal perfusion / normal MAP Foley replaced Free water Q8 once coretrak replaced D5w to 75 ml/hr  GASTROINTESTINAL Acute pancreatitis - likely r/t gallstones, ? If he passed a stone, improved enzymes.  [place] without stone, mild distention of gallbladder P:   Surgery following  NPO  Cont abx as above  Leave gastric tube to LIS  Defer further imaging to CCS, suspect will need repeat CT ABD in near future Continue trickle feeding with vital small peptid formulation once post-pyloric tube replaced  HEMATOLOGIC DVT prevention P:  F/u CBC  SQ heparin   INFECTIOUS Acute pancreatitis  Fever - secondary to above P:   ABX as above  Follow cultures  Consider ABX stop date 5/31 (7 day course) Trend lactate, pct  PRN tylenol for temp > 101.5   ENDOCRINE Hyperglycemia - hgbA1c=5.7   P:   Continue SSI CBG Q4 Lantus to 35 units QD  NEUROLOGIC Sedation needs on vent - resolved Anxiety - sees a therapist as an outpatient, managed off medications / with therapy P:   RASS goal: n/a Check QtC and consider adding Risperdal/haldol for delirium   FAMILY  - Updates:  Pt and wife updated at bedside 5/29   - Inter-disciplinary family meet or Palliative Care meeting due by:  Day 7    Joneen Roach, AGACNP-BC [facility] Pulmonology/Critical Care Pager [phone] or [phone]  12/29/2015 10:48 AM  PCCM Attending Note: Patient seen and examined with nurse practitioner. Please refer to his note which I reviewed in detail. Patient more agitated overnight & pulled out NGT. Now denies any chest pain or  pressure. No abdominal pain or nausea. No abdominal pain. Wants to eat ice chips.  Review of Systems:  No fever, chills, or sweats. No dyspnea or cough per patient report.  BP 176/111 mmHg  Pulse 124  Temp(Src) 98.8 F (37.1 C) (Oral)  Resp 39  Ht 5\' 11"  (1.803 m)  Wt 291 lb 10.7 oz (132.3 kg)  BMI 40.70 kg/m2  SpO2 94%  General:  Awake. Mother at bedside. No distress. Integument:  Warm & dry. No rash on exposed skin. Abdomen:  Protuberant. Hypoactive bowel sounds. Nontender. Pulmonary:  Mild tachypnea. Normal work of breathing. Diminished breath sounds bilateral lung bases.  Neuro:  Grossly nonfocal. Moving all 4 extremities equally. A & Ox4.  CBC Latest Ref Rng 12/27/2015 12/26/2015 12/25/2015  WBC 4.0 - 10.5 K/uL 9.7 13.5(H) 12.9(H)  Hemoglobin 13.0 - 17.0 g/dL 11.3(L) 12.0(L) 12.4(L)  Hematocrit 39.0 - 52.0 % 35.2(L) 36.8(L) 38.2(L)  Platelets 150 - 400 K/uL 221 252 223    BMP Latest Ref Rng 12/29/2015 12/28/2015 12/27/2015  Glucose 65 - 99 mg/dL 045(W205(H) 098(J225(H) 191(Y237(H)  BUN 6 - 20 mg/dL 78(G21(H) 19 20  Creatinine 0.61 - 1.24 mg/dL 9.561.19 2.131.19 0.861.19  Sodium 135 - 145 mmol/L 152(H) 153(H) 152(H)  Potassium 3.5 - 5.1 mmol/L 3.3(L) 3.4(L) 3.5  Chloride 101 - 111 mmol/L 118(H) 119(H) 119(H)  CO2 22 - 32 mmol/L 28 28 27   Calcium 8.9 - 10.3 mg/dL 7.9(L) 7.5(L) 6.9(L)    A/P:  40 y.o. Male with severe pancreatitis recovering well. Delirium overnight resolved. Adding Lopressor to control hypertension. Replacing post-pyloric feeding tube. Suspect atelectasis given abdominal girth & physical exam findings. Hypernatremia slowly improving.  1. Severe Pancreatitis:  Appreciate CCS input. Continuing post-pyloric tube feedings. Holding on Abdominal CT given exam. 2. Delirium:  Limiting benzos & sedating medications. Checking EKG. May be able to use Haldol or Seroquel for delirium. 3. Hypertension:  Holding lisinopril given recent acute renal failure. Lorepssor IV tid & Hydralazin qid  scheduled. 4. Hypernatremia:  Improving. Trending electrolytes daily. Resume free water when NGT replaced. D5W for now. 5. Disposition:  Transitioning to SDU. TRH to assume care & PCCM will sign off as of 5/31.  Rest as per note above.  Donna ChristenJennings E. Jamison NeighborNestor, M.D. [facility] Pulmonary & Critical Care Pager:  [phone] After 3pm or if no response, call [phone] 11:34 AM 12/29/2015

## 2015-12-29 NOTE — Progress Notes (Signed)
Patient pulled out NG tube. Elink called and notified. Orders to replace NG tube and get a stat chest Xray were given.

## 2015-12-29 NOTE — Progress Notes (Signed)
Physical Therapy Treatment Patient Details Name: Timothy [name] MRN: [id number] DOB: [birth year] Today's Date: 12/29/2015    History of Present Illness Pt is a 40 yo male admitted for pancreatitis. PMH: low back surgery in 2007.    PT Comments    Alert, cooperative. Limited by fatigue, elevated HR, generalized weakness. Patient able to stand twice from EOB (max standing 60 seconds) with HR up to 150 and slow to return to 128 (resting rate) once seated. Returned to bed due to upcoming procedure.   Follow Up Recommendations  Home health PT;Supervision/Assistance - 24 hour     Equipment Recommendations  Rolling walker with 5" wheels    Recommendations for Other Services       Precautions / Restrictions Precautions Precautions: Fall Restrictions Weight Bearing Restrictions: No    Mobility  Bed Mobility Overal bed mobility: Needs Assistance Bed Mobility: Supine to Sit;Sit to Supine     Supine to sit: HOB elevated;Min assist;+2 for safety/equipment Sit to supine: Min assist;+2 for physical assistance;+2 for safety/equipment;HOB elevated   General bed mobility comments: used bed rail, assist for trunk elevation  Transfers Overall transfer level: Needs assistance Equipment used: 2 person hand held assist Transfers: Sit to/from Stand Sit to Stand: Min assist;+2 physical assistance;+2 safety/equipment;From elevated surface (ICU bed)         General transfer comment: pt unsteady, wide base of support, mild anxiety; x 2  Ambulation/Gait Ambulation/Gait assistance: Min assist;+2 physical assistance;+2 safety/equipment Ambulation Distance (Feet): 2 Feet (seated rest, 1 ft) Assistive device: 2 person hand held assist Gait Pattern/deviations: Step-to pattern     General Gait Details: side stepping to Rt [facility](HOB)   Stairs            Wheelchair Mobility    Modified Rankin (Stroke Patients Only)       Balance   Sitting-balance support: No upper extremity  supported;Feet supported Sitting balance-Leahy Scale: Fair     Standing balance support: Bilateral upper extremity supported Standing balance-Leahy Scale: Poor Standing balance comment: incr sway upon initial standing each time                    Cognition Arousal/Alertness: Awake/alert Behavior During Therapy: WFL for tasks assessed/performed Overall Cognitive Status: Within Functional Limits for tasks assessed                      Exercises General Exercises - Lower Extremity Ankle Circles/Pumps: AROM;Both;10 reps Quad Sets: AROM;Both;5 reps    General Comments        Pertinent Vitals/Pain BP with appropriate increase (pt reporting dizziness); see HR above  Pain Assessment: No/denies pain    Home Living                      Prior Function            PT Goals (current goals can now be found in the care plan section) Acute Rehab PT Goals Patient Stated Goal: move Time For Goal Achievement: 01/04/16 Progress towards PT goals: Progressing toward goals    Frequency  Min 3X/week    PT Plan Current plan remains appropriate    Co-evaluation             End of Session   Activity Tolerance: No increased pain;Patient limited by fatigue Patient left: with call bell/phone within reach;in bed;with family/visitor present     Time: [id number] PT Time Calculation (min) (ACUTE ONLY): 21 min  Charges:  $Therapeutic Activity:  8-22 mins                    G Codes:      Jurgen Groeneveld January 07, 2016, 2:00 PM Pager [phone]

## 2015-12-29 NOTE — Progress Notes (Signed)
Pt scheduled to have CT abd/pelvis.  Still having large amount of bilious outpt from NG tube.  As a result, unable to get oral contrast.  Per surgery note he is not a surgical at this time.  He is already on Abx.  Hemodynamics otherwise stable.    Will d/c CT abd/pelvis for now > can re-order when able to get better study done.  Coralyn HellingVineet Kamee Bobst, MD [facility] Pulmonary/Critical Care 12/29/2015, 8:49 PM Pager:  [phone] After 3pm call: [phone]

## 2015-12-29 NOTE — Progress Notes (Signed)
Subjective: Pt pulled NG and cortrak feeding tube; NG replaced. Temp curve improving. Denies abd pain  Objective: Vital signs in last 24 hours: Temp:  [97 F (36.1 C)-100.6 F (38.1 C)] 97.8 F (36.6 C) (05/30 0400) Pulse Rate:  [94-133] 118 (05/30 0600) Resp:  [26-44] 33 (05/30 0600) BP: (150-179)/(82-108) 169/108 mmHg (05/30 0600) SpO2:  [92 %-97 %] 95 % (05/30 0600) Last BM Date: 12/29/15  Intake/Output from previous day: 05/29 0701 - 05/30 0700 In: 1818.3 [I.V.:1133.3; NG/GT:185; IV Piggyback:500] Out: 1905 [Urine:1025; Emesis/NG output:880] Intake/Output this shift:    Awake, restrained, ox3; not too talkative cta b/l Tachy Obese, soft, no guarding/peritonitis No edema  Lab Results:   Recent Labs  12/27/15 0422  WBC 9.7  HGB 11.3*  HCT 35.2*  PLT 221   BMET  Recent Labs  12/28/15 0425 12/29/15 0430  NA 153* 152*  K 3.4* 3.3*  CL 119* 118*  CO2 28 28  GLUCOSE 225* 205*  BUN 19 21*  CREATININE 1.19 1.19  CALCIUM 7.5* 7.9*   PT/INR No results for input(s): LABPROT, INR in the last 72 hours. ABG  Recent Labs  12/29/15 0121  PHART 7.504*  HCO3 26.5*    Studies/Results: Dg Chest Port 1 View  12/29/2015  CLINICAL DATA:  Respiratory failure. EXAM: PORTABLE CHEST 1 VIEW COMPARISON:  11/27/2015. FINDINGS: Endotracheal tube tip not definitely identified over the trachea. Clinical correlation suggested . Following discussion of this finding with the patient's nurse I was informed that the patient has been extubated. Tubing over the upper chest may be related oxygen therapy . NG tube noted below left hemidiaphragm. Left IJ line in stable position . Heart size stable. Low lung volumes with bibasilar atelectasis. Left lower lobe infiltrate cannot be excluded. Stable left upper lobe calcified nodule consistent with granuloma. No pleural effusion or pneumothorax . IMPRESSION: 1. Endotracheal tube tip not definitely identified over the trachea. Clinical  correlation suggested. Following discussion of this finding with the patient's nurse I was informed that the patient has been extubated. Tubing over the upper chest may be related to oxygen therapy. NG tube and left IJ line in stable position. 2. Low lung volumes with bibasilar atelectasis. Left lower lobe infiltrate cannot be excluded. Critical Value/emergent results were called by telephone at the time of interpretation on 12/29/2015 at 6:57 am to the patient's nurse who verbally acknowledged these results. Electronically Signed   By: Maisie Fus  Register   On: 12/29/2015 07:02   Dg Abd Portable 1v  12/29/2015  CLINICAL DATA:  NG tube placement. EXAM: PORTABLE ABDOMEN - 1 VIEW COMPARISON:  Yesterday at 2216 hour FINDINGS: Tip and side port of the enteric tube projects over the expected location of the stomach. Mild gaseous distention of large and small bowel loops in the upper abdomen. IMPRESSION: Tip and side port of the enteric tube project over the expected location of the stomach. Electronically Signed   By: Rubye Oaks M.D.   On: 12/29/2015 04:21   Dg Abd Portable 1v  12/28/2015  CLINICAL DATA:  Nasogastric tube placement.  Initial encounter. EXAM: PORTABLE ABDOMEN - 1 VIEW COMPARISON:  Abdominal radiograph performed earlier today at 9:13 p.m. FINDINGS: The patient's enteric tube is seen ending overlying the antrum of the stomach. The visualized bowel gas pattern is grossly unremarkable, with scattered air filled loops of small bowel seen. No free intra-abdominal air is identified, though evaluation for free air is limited on a single supine view. The visualized lung bases are grossly  clear. No acute osseous abnormalities are seen. IMPRESSION: Enteric tube seen ending overlying the antrum of the stomach. Electronically Signed   By: Roanna RaiderJeffery  Chang M.D.   On: 12/28/2015 22:32   Dg Abd Portable 1v  12/28/2015  CLINICAL DATA:  Encounter for nasogastric tube placement. EXAM: PORTABLE ABDOMEN - 1 VIEW  COMPARISON:  Earlier this day at 1647 hour FINDINGS: The previous weighted enteric tube is no longer seen. A new enteric tube is seen with its tip in the region of distal esophagus, only faintly visualized due to habitus in overlying monitoring devices. Recommend advancement. IMPRESSION: Tip of the enteric tube in the distal esophagus. Recommend advancement of at least 10 cm. Electronically Signed   By: Rubye OaksMelanie  Ehinger M.D.   On: 12/28/2015 21:24   Dg Abd Portable 1v  12/28/2015  CLINICAL DATA:  40 year old male post feeding tube placement. Subsequent encounter. EXAM: PORTABLE ABDOMEN - 1 VIEW COMPARISON:  12/25/2015. FINDINGS: Feeding tube tip at the expected level of the distal duodenum/ligament of Treitz. Gas-filled prominent size transverse colon. No gross free intraperitoneal narrow although evaluation limited by motion. Remainder of exam limited. IMPRESSION: Feeding tube tip at the expected level of the distal duodenum/ligament of Treitz. Electronically Signed   By: Lacy DuverneySteven  Olson M.D.   On: 12/28/2015 17:41    Anti-infectives: Anti-infectives    Start     Dose/Rate Route Frequency Ordered Stop   12/26/15 1530  erythromycin ethylsuccinate (EES) 200 MG/5ML suspension 200 mg     200 mg Per Tube  Once 12/26/15 1520 12/26/15 1646   12/24/15 1200  imipenem-cilastatin (PRIMAXIN) 500 mg in sodium chloride 0.9 % 100 mL IVPB     500 mg 200 mL/hr over 30 Minutes Intravenous Every 6 hours 12/24/15 0913     12/24/15 0930  vancomycin (VANCOCIN) IVPB 1000 mg/200 mL premix  Status:  Discontinued     1,000 mg 200 mL/hr over 60 Minutes Intravenous Every 12 hours 12/24/15 0913 12/26/15 0937   12/23/15 0800  imipenem-cilastatin (PRIMAXIN) 500 mg in sodium chloride 0.9 % 100 mL IVPB  Status:  Discontinued     500 mg 200 mL/hr over 30 Minutes Intravenous Every 8 hours 12/23/15 0705 12/24/15 0913      Assessment/Plan: Severe Gallstone pancreatitis SIRS AKI - resolved HTN Protein calorie  malnutrition Ileus  On exam NG looks like it is in too far but on plain film appears to be in stomach. Doesn't necessarily appear coiled on CXR.   Would rec cont NG decompression given how much bilious output  Can replaced cortrak feeding tube and resume distal TF Not a candidate for cholecystectomy during this admission given severity of pancreatitis Consider repeat CT a/p with contrast to re-evaluate pancreatitis  Mary SellaEric M. Andrey CampanileWilson, MD, FACS General, Bariatric, & Minimally Invasive Surgery [facility] [place]   LOS: 7 days    Atilano InaWILSON,Brexley Cutshaw M 12/29/2015

## 2015-12-29 NOTE — Progress Notes (Signed)
eLink Physician-Brief Progress Note Patient Name: Timothy [name] DOB: 15-Aug-1975 MRN: [id number]   Date of Service  12/29/2015  HPI/Events of Note  hypokalemia  eICU Interventions  Give 40meq via CVL Core track team will place it tomorrow.      Intervention Category Major Interventions: Electrolyte abnormality - evaluation and management  Eiman Maret 12/29/2015, 4:38 PM

## 2015-12-30 ENCOUNTER — Inpatient Hospital Stay (HOSPITAL_COMMUNITY): Payer: Federal, State, Local not specified - PPO

## 2015-12-30 LAB — BASIC METABOLIC PANEL
Anion gap: 5 (ref 5–15)
BUN: 26 mg/dL — ABNORMAL HIGH (ref 6–20)
CO2: 29 mmol/L (ref 22–32)
Calcium: 7.7 mg/dL — ABNORMAL LOW (ref 8.9–10.3)
Chloride: 120 mmol/L — ABNORMAL HIGH (ref 101–111)
Creatinine, Ser: 1.3 mg/dL — ABNORMAL HIGH (ref 0.61–1.24)
GFR calc Af Amer: >60 mL/min (ref >60)
GFR calc non Af Amer: >60 mL/min (ref >60)
Glucose, Bld: 182 mg/dL — ABNORMAL HIGH (ref 65–99)
Potassium: 3.4 mmol/L — ABNORMAL LOW (ref 3.5–5.1)
Sodium: 154 mmol/L — ABNORMAL HIGH (ref 135–145)

## 2015-12-30 LAB — GLUCOSE, CAPILLARY
Glucose-Capillary: 192 mg/dL — ABNORMAL HIGH (ref 65–99)
Glucose-Capillary: 203 mg/dL — ABNORMAL HIGH (ref 65–99)
Glucose-Capillary: 220 mg/dL — ABNORMAL HIGH (ref 65–99)
Glucose-Capillary: 318 mg/dL — ABNORMAL HIGH (ref 65–99)
Glucose-Capillary: 383 mg/dL — ABNORMAL HIGH (ref 65–99)

## 2015-12-30 LAB — CBC
HCT: 37.5 % — ABNORMAL LOW (ref 39.0–52.0)
Hemoglobin: 11.8 g/dL — ABNORMAL LOW (ref 13.0–17.0)
MCH: 30.1 pg (ref 26.0–34.0)
MCHC: 31.5 g/dL (ref 30.0–36.0)
MCV: 95.7 fL (ref 78.0–100.0)
Platelets: 323 10*3/uL (ref 150–400)
RBC: 3.92 MIL/uL — ABNORMAL LOW (ref 4.22–5.81)
RDW: 14.9 % (ref 11.5–15.5)
WBC: 16.3 10*3/uL — ABNORMAL HIGH (ref 4.0–10.5)

## 2015-12-30 LAB — LIPASE, BLOOD: Lipase: 16 U/L (ref 11–51)

## 2015-12-30 MED ORDER — CETYLPYRIDINIUM CHLORIDE 0.05 % MT LIQD
7.0000 mL | Freq: Two times a day (BID) | OROMUCOSAL | Status: DC
  Administered 2015-12-30 – 2016-01-03 (×8): 7 mL via OROMUCOSAL

## 2015-12-30 MED ORDER — PANTOPRAZOLE SODIUM 40 MG PO TBEC
40.0000 mg | DELAYED_RELEASE_TABLET | Freq: Every day | ORAL | Status: DC
  Administered 2015-12-30 – 2016-01-04 (×6): 40 mg via ORAL
  Filled 2015-12-30 (×6): qty 1

## 2015-12-30 MED ORDER — CLONIDINE HCL 0.2 MG/24HR TD PTWK
0.2000 mg | MEDICATED_PATCH | TRANSDERMAL | Status: DC
  Administered 2015-12-30: 0.2 mg via TRANSDERMAL
  Filled 2015-12-30: qty 1

## 2015-12-30 NOTE — Progress Notes (Signed)
Subjective: Denies abd pain. Had BM.   Objective: Vital signs in last 24 hours: Temp:  [97.5 F (36.4 C)-99.6 F (37.6 C)] 98.1 F (36.7 C) (05/31 0737) Pulse Rate:  [98-127] 109 (05/31 0737) Resp:  [8-43] 34 (05/31 0737) BP: (130-176)/(70-111) 148/92 mmHg (05/31 0737) SpO2:  [93 %-98 %] 94 % (05/31 0737) Last BM Date: 12/29/15  Intake/Output from previous day: 05/30 0701 - 05/31 0700 In: 1536.3 [I.V.:1156.3; NG/GT:30; IV Piggyback:350] Out: 2396 [Urine:1025; Emesis/NG output:1370; Stool:1] Intake/Output this shift:    Awake, appropriate Obese, soft, mild TTP; no guarding/rebound NG appears very far in, 1400 light bile, has had some ice chips  Lab Results:   Recent Labs  12/30/15 0350  WBC 16.3*  HGB 11.8*  HCT 37.5*  PLT 323   CBC Latest Ref Rng 12/30/2015 12/27/2015 12/26/2015  WBC 4.0 - 10.5 K/uL 16.3(H) 9.7 13.5(H)  Hemoglobin 13.0 - 17.0 g/dL 11.8(L) 11.3(L) 12.0(L)  Hematocrit 39.0 - 52.0 % 37.5(L) 35.2(L) 36.8(L)  Platelets 150 - 400 K/uL 323 221 252     BMET  Recent Labs  12/29/15 0430 12/30/15 0350  NA 152* 154*  K 3.3* 3.4*  CL 118* 120*  CO2 28 29  GLUCOSE 205* 182*  BUN 21* 26*  CREATININE 1.19 1.30*  CALCIUM 7.9* 7.7*   PT/INR No results for input(s): LABPROT, INR in the last 72 hours. ABG  Recent Labs  12/29/15 0121  PHART 7.504*  HCO3 26.5*    Studies/Results: Dg Chest Port 1 View  12/29/2015  CLINICAL DATA:  Respiratory failure. EXAM: PORTABLE CHEST 1 VIEW COMPARISON:  11/27/2015. FINDINGS: Endotracheal tube tip not definitely identified over the trachea. Clinical correlation suggested . Following discussion of this finding with the patient's nurse I was informed that the patient has been extubated. Tubing over the upper chest may be related oxygen therapy . NG tube noted below left hemidiaphragm. Left IJ line in stable position . Heart size stable. Low lung volumes with bibasilar atelectasis. Left lower lobe infiltrate cannot  be excluded. Stable left upper lobe calcified nodule consistent with granuloma. No pleural effusion or pneumothorax . IMPRESSION: 1. Endotracheal tube tip not definitely identified over the trachea. Clinical correlation suggested. Following discussion of this finding with the patient's nurse I was informed that the patient has been extubated. Tubing over the upper chest may be related to oxygen therapy. NG tube and left IJ line in stable position. 2. Low lung volumes with bibasilar atelectasis. Left lower lobe infiltrate cannot be excluded. Critical Value/emergent results were called by telephone at the time of interpretation on 12/29/2015 at 6:57 am to the patient's nurse who verbally acknowledged these results. Electronically Signed   By: Maisie Fus  Register   On: 12/29/2015 07:02   Dg Abd Portable 1v  12/29/2015  CLINICAL DATA:  NG tube placement. EXAM: PORTABLE ABDOMEN - 1 VIEW COMPARISON:  Yesterday at 2216 hour FINDINGS: Tip and side port of the enteric tube projects over the expected location of the stomach. Mild gaseous distention of large and small bowel loops in the upper abdomen. IMPRESSION: Tip and side port of the enteric tube project over the expected location of the stomach. Electronically Signed   By: Rubye Oaks M.D.   On: 12/29/2015 04:21   Dg Abd Portable 1v  12/28/2015  CLINICAL DATA:  Nasogastric tube placement.  Initial encounter. EXAM: PORTABLE ABDOMEN - 1 VIEW COMPARISON:  Abdominal radiograph performed earlier today at 9:13 p.m. FINDINGS: The patient's enteric tube is seen ending overlying the  antrum of the stomach. The visualized bowel gas pattern is grossly unremarkable, with scattered air filled loops of small bowel seen. No free intra-abdominal air is identified, though evaluation for free air is limited on a single supine view. The visualized lung bases are grossly clear. No acute osseous abnormalities are seen. IMPRESSION: Enteric tube seen ending overlying the antrum of the  stomach. Electronically Signed   By: Roanna RaiderJeffery  Chang M.D.   On: 12/28/2015 22:32   Dg Abd Portable 1v  12/28/2015  CLINICAL DATA:  Encounter for nasogastric tube placement. EXAM: PORTABLE ABDOMEN - 1 VIEW COMPARISON:  Earlier this day at 1647 hour FINDINGS: The previous weighted enteric tube is no longer seen. A new enteric tube is seen with its tip in the region of distal esophagus, only faintly visualized due to habitus in overlying monitoring devices. Recommend advancement. IMPRESSION: Tip of the enteric tube in the distal esophagus. Recommend advancement of at least 10 cm. Electronically Signed   By: Rubye OaksMelanie  Ehinger M.D.   On: 12/28/2015 21:24   Dg Abd Portable 1v  12/28/2015  CLINICAL DATA:  40 year old male post feeding tube placement. Subsequent encounter. EXAM: PORTABLE ABDOMEN - 1 VIEW COMPARISON:  12/25/2015. FINDINGS: Feeding tube tip at the expected level of the distal duodenum/ligament of Treitz. Gas-filled prominent size transverse colon. No gross free intraperitoneal narrow although evaluation limited by motion. Remainder of exam limited. IMPRESSION: Feeding tube tip at the expected level of the distal duodenum/ligament of Treitz. Electronically Signed   By: Lacy DuverneySteven  Olson M.D.   On: 12/28/2015 17:41    Anti-infectives: Anti-infectives    Start     Dose/Rate Route Frequency Ordered Stop   12/26/15 1530  erythromycin ethylsuccinate (EES) 200 MG/5ML suspension 200 mg     200 mg Per Tube  Once 12/26/15 1520 12/26/15 1646   12/24/15 1200  imipenem-cilastatin (PRIMAXIN) 500 mg in sodium chloride 0.9 % 100 mL IVPB     500 mg 200 mL/hr over 30 Minutes Intravenous Every 6 hours 12/24/15 0913     12/24/15 0930  vancomycin (VANCOCIN) IVPB 1000 mg/200 mL premix  Status:  Discontinued     1,000 mg 200 mL/hr over 60 Minutes Intravenous Every 12 hours 12/24/15 0913 12/26/15 0937   12/23/15 0800  imipenem-cilastatin (PRIMAXIN) 500 mg in sodium chloride 0.9 % 100 mL IVPB  Status:  Discontinued      500 mg 200 mL/hr over 30 Minutes Intravenous Every 8 hours 12/23/15 0705 12/24/15 0913      Assessment/Plan: Severe Gallstone pancreatitis SIRS AKI - resolved HTN Protein calorie malnutrition Ileus  rec checking abd film to verify NG position. If post pyloric needs to be pulled back into stomach. If in stomach, can try NG clamping trials and see if tolerates handling own gastric secretions.   Need to address nutritional state. Would hold off on oral diet until knows he can tolerate own gastric secretions. So needs postpyloric feeds or need to start TPN  Given severity of pancreatitis he is not a candidate for cholecystectomy during this admission  Consider repeating CT abd/pelvis with contrast  Will sign off.  Can consult GI medicine for assistance of medical mgmt of pancreatitis outpt follow up with [facility] to discuss elective lap cholecystectomy  Mary SellaEric M. Andrey CampanileWilson, MD, FACS General, Bariatric, & Minimally Invasive Surgery [facility] [place]   LOS: 8 days    Atilano InaWILSON,Arletha Marschke M 12/30/2015

## 2015-12-30 NOTE — Progress Notes (Signed)
[facility]                                                                              Patient Demographics  Timothy [name], is a 40 y.o. male, DOB - [birth year], CHY:[id number]  Admit date - 12/21/2015   Admitting Physician Timothy Kocher, MD  Outpatient Primary MD for the patient is Timothy Nip, MD  Outpatient specialists:   LOS - 8  days    Chief Complaint  Patient presents with  . Chest Pain  . Abdominal Pain       Brief summary   40 year old male with history of hypertension, presented to the emergency department with complaints of epigastric pain, nausea and vomiting. Patient admitted for sepsis/acute pancreatitis abnormal LFTs. Right upper quadrant ultrasound showed cholelithiasis without evidence of acute cholecystitis or biliary dilatation. CTA chest, abdomen and pelvis, showed. Pancreatic inflammation consistent with pancreatitis, per pancreatic fluid tracking in the anterior pararenal space and both pericolic gutters, mild gallbladder distention no calcified stone. Surgery was consulted for possible laparoscopic cholecystectomy. On 5/25, patient, went into acute respiratory distress, worsening tachycardia and CCM was consulted. Patient was transferred to ICU and was intubated. Patient now back to stepdown unit, [facility] assumed care on 5/31  Assessment & Plan   Acute severe pancreatitis: With shock/sepsis, acute respiratory failure complicated with ileus - Lipase improved, currently on NGT suctioning, CCM and surgery following  - Patient had pulled out post pyloric feeding tube  - Currently receiving IV fluids - Needs a repeat CT abdomen however on NG suctioning hence cannot give oral contrast - Per general surgery, can try NG tube clamping trials to assess if patient can handle on gastric secretions - Per general surgery, not a candidate for cholecystectomy at this time  Acute encephalopathy with acute respiratory failure  - Currently improved,  alert and oriented 3 -Limit benzodiazepine or any sedating medications. Per CCM may be able to use Haldol or Seroquel for delirium - Extubated, O2 sats 97% on room air  Hypernatremia - Improving, continue D5 water for now - Continue free water   AKI (acute kidney injury) ([place]) with lactic acidosis, metabolic acidosis - Cr improving, lactic acid normalized with IV fluids, hold IV fluids for now   Accelerated hypertension - continue hydralazine, scheduled metoprolol - placed on clonidine patch, hold oral meds    Leukocytosis - Likely due to #1, blood cultures negative so far, continue imipenem   Hyperglycemia - Hemoglobin A1c 5.7, sliding scale insulin   Obesity - Diet and weight control  Moderate to Severe protein calorie malnutrition  Code Status:Full CODE STATUS   DVT Prophylaxis: Lovenox   Family Communication: Discussed in detail with the patient, all imaging results, lab results explained to the patient  Disposition Plan: Monitor in stepdown unit  Time Spent in minutes 35 minutes  Procedures:  Ultrasound abdomen CT chest/abdomen Intubation, extubation  Consultants:  Gen. Surgery CCM  Antimicrobials:  Imipenem 5/24>>   Medications  Scheduled Meds: . antiseptic oral rinse  7 mL Mouth Rinse 10 times per day  . chlorhexidine gluconate (SAGE KIT)  15 mL Mouth Rinse BID  . cloNIDine  0.1 mg Oral TID  . feeding supplement (VITAL 1.5 CAL)  1,000 mL Per Tube Q24H  . free water  300 mL Per Tube Q8H  . heparin subcutaneous  5,000 Units Subcutaneous Q8H  . hydrALAZINE  10 mg Intravenous QID  . imipenem-cilastatin  500 mg Intravenous Q6H  . insulin aspart  0-20 Units Subcutaneous Q4H  . metoprolol  5 mg Intravenous Q8H  . pantoprazole  40 mg Oral QHS  . sodium chloride flush  10-40 mL Intracatheter Q12H   Continuous Infusions: . dextrose 100 mL/hr at 12/30/15 0758   PRN Meds:.acetaminophen, diphenhydrAMINE, fentaNYL (SUBLIMAZE) injection,  hydrALAZINE, nitroGLYCERIN, ondansetron **OR** ondansetron (ZOFRAN) IV, sodium chloride flush   Antibiotics   Anti-infectives    Start     Dose/Rate Route Frequency Ordered Stop   12/26/15 1530  erythromycin ethylsuccinate (EES) 200 MG/5ML suspension 200 mg     200 mg Per Tube  Once 12/26/15 1520 12/26/15 1646   12/24/15 1200  imipenem-cilastatin (PRIMAXIN) 500 mg in sodium chloride 0.9 % 100 mL IVPB     500 mg 200 mL/hr over 30 Minutes Intravenous Every 6 hours 12/24/15 0913     12/24/15 0930  vancomycin (VANCOCIN) IVPB 1000 mg/200 mL premix  Status:  Discontinued     1,000 mg 200 mL/hr over 60 Minutes Intravenous Every 12 hours 12/24/15 0913 12/26/15 0937   12/23/15 0800  imipenem-cilastatin (PRIMAXIN) 500 mg in sodium chloride 0.9 % 100 mL IVPB  Status:  Discontinued     500 mg 200 mL/hr over 30 Minutes Intravenous Every 8 hours 12/23/15 0705 12/24/15 0913        Subjective:   Timothy [name] was seen and examined today.Alert and oriented, O2 sats 97% on room air. Overall improving. Afebrile. No abdominal pain. Patient denies dizziness, chest pain, shortness of breath,  new weakness, numbess, tingling. No acute events overnight.    Objective:   Filed Vitals:   12/30/15 0600 12/30/15 0737 12/30/15 1000 12/30/15 1031  BP: 150/83 148/92 151/92   Pulse: 99 109 102 110  Temp:  98.1 F (36.7 C)    TempSrc:  Oral    Resp: 33 34 35 31  Height:      Weight:      SpO2: 95% 94% 96% 97%    Intake/Output Summary (Last 24 hours) at 12/30/15 1140 Last data filed at 12/30/15 0100  Gross per 24 hour  Intake 1286.25 ml  Output   1701 ml  Net -414.75 ml     Wt Readings from Last 3 Encounters:  12/28/15 132.3 kg (291 lb 10.7 oz)     Exam  General: Alert and oriented x 3, NAD  HEENT:  NGT +   Neck: Supple, no JVD  Cardiovascular: S1 S2 clear, tachycardia  Respiratory: Clear to auscultation bilaterally, no wheezing, rales or rhonchi  Gastrointestinal: Soft, mild  diffuse tenderness, nondistended, + bowel sounds  Ext: no cyanosis clubbing or edema  Neuro: no new deficits  Skin: No rashes  Psych: Normal affect and demeanor, alert and oriented x3    Data Reviewed:  I have personally reviewed following labs and imaging studies  Micro Results Recent Results (from the past 240 hour(s))  Blood culture (routine x 2)     Status: None   Collection Time: 12/22/15 12:15 AM  Result Value Ref Range Status   Specimen Description BLOOD RIGHT ANTECUBITAL  Final   Special Requests BOTTLES DRAWN AEROBIC AND ANAEROBIC 5CC  Final   Culture NO GROWTH 5  DAYS  Final   Report Status 12/27/2015 FINAL  Final  Blood culture (routine x 2)     Status: None   Collection Time: 12/22/15 12:20 AM  Result Value Ref Range Status   Specimen Description BLOOD LEFT ANTECUBITAL  Final   Special Requests BOTTLES DRAWN AEROBIC AND ANAEROBIC 5CC  Final   Culture NO GROWTH 5 DAYS  Final   Report Status 12/27/2015 FINAL  Final  MRSA PCR Screening     Status: None   Collection Time: 12/22/15  4:00 PM  Result Value Ref Range Status   MRSA by PCR NEGATIVE NEGATIVE Final    Comment:        The GeneXpert MRSA Assay (FDA approved for NASAL specimens only), is one component of a comprehensive MRSA colonization surveillance program. It is not intended to diagnose MRSA infection nor to guide or monitor treatment for MRSA infections.     Radiology Reports Dg Abd 1 View  12/25/2015  CLINICAL DATA:  Feeding tube placement EXAM: ABDOMEN - 1 VIEW COMPARISON:  None. FINDINGS: Feeding tube has been placed. Tip overlies the level of the descending portion of the duodenum. There is mild patient motion artifact, limiting detail. IMPRESSION: Feeding tube tip overlying the level of the descending portion the duodenum. Electronically Signed   By: Nolon Nations M.D.   On: 12/25/2015 14:41   Dg Chest Port 1 View  12/29/2015  CLINICAL DATA:  Respiratory failure. EXAM: PORTABLE CHEST 1 VIEW  COMPARISON:  11/27/2015. FINDINGS: Endotracheal tube tip not definitely identified over the trachea. Clinical correlation suggested . Following discussion of this finding with the patient's nurse I was informed that the patient has been extubated. Tubing over the upper chest may be related oxygen therapy . NG tube noted below left hemidiaphragm. Left IJ line in stable position . Heart size stable. Low lung volumes with bibasilar atelectasis. Left lower lobe infiltrate cannot be excluded. Stable left upper lobe calcified nodule consistent with granuloma. No pleural effusion or pneumothorax . IMPRESSION: 1. Endotracheal tube tip not definitely identified over the trachea. Clinical correlation suggested. Following discussion of this finding with the patient's nurse I was informed that the patient has been extubated. Tubing over the upper chest may be related to oxygen therapy. NG tube and left IJ line in stable position. 2. Low lung volumes with bibasilar atelectasis. Left lower lobe infiltrate cannot be excluded. Critical Value/emergent results were called by telephone at the time of interpretation on 12/29/2015 at 6:57 am to the patient's nurse who verbally acknowledged these results. Electronically Signed   By: [place]   On: 12/29/2015 07:02   Dg Chest Port 1 View  12/27/2015  CLINICAL DATA:  Acute respiratory failure. EXAM: PORTABLE CHEST 1 VIEW COMPARISON:  Yesterday. FINDINGS: Endotracheal tube in satisfactory position. Nasogastric tube extending into the stomach. A feeding tube is also seen extending into the distal esophagus, not visualized more distally due to thickness of overlying soft tissues. Left jugular catheter tip in the proximal superior vena cava. Minimal left basilar airspace opacity with improvement and stable linear density in the right lower lung zone. The heart remains grossly normal in size. Small bilateral calcified granulomata are noted. Unremarkable bones. IMPRESSION: 1. Minimal  left basilar atelectasis or pneumonia with improvement. 2. Small amount of linear atelectasis or pleural fluid in the minor fissure in the right lower lung zone. Electronically Signed   By: Claudie Revering M.D.   On: 12/27/2015 07:50   Dg Chest [name]  12/26/2015  CLINICAL DATA:  Pancreatitis EXAM: PORTABLE CHEST 1 VIEW COMPARISON:  12/24/2015 FINDINGS: Bilateral mild interstitial thickening. No pleural effusion or pneumothorax. Relatively low lung volumes. Stable cardiomediastinal silhouette. Endotracheal tube with the tip 4 cm above the carina. Nasogastric tube coursing below the diaphragm. Left IJ central venous catheter with the tip projecting over the confluence of the SVC/left brachiocephalic vein. IMPRESSION: 1. Stable support lines and tubing. 2. Mild pulmonary vascular congestion. Electronically Signed   By: Kathreen Devoid   On: 12/26/2015 09:25   Dg Chest Port 1 View  12/24/2015  CLINICAL DATA:  Central line placement EXAM: PORTABLE CHEST 1 VIEW COMPARISON:  12/24/2015 FINDINGS: Borderline cardiomegaly. Endotracheal tube in place with tip 3.2 cm above the carina. NG tube in place. Central mild vascular congestion and mild perihilar interstitial prominence suspicious for mild interstitial edema. Left IJ central line with tip in SVC. No pneumothorax. Mild basilar atelectasis. IMPRESSION: Borderline cardiomegaly. Endotracheal tube and NG tube in place. Left IJ central line with tip in SVC. No pneumothorax. Mild basilar atelectasis. Central mild vascular congestion and mild perihilar interstitial prominence suspicious for mild interstitial edema pre Electronically Signed   By: Lahoma Crocker M.D.   On: 12/24/2015 10:27   Dg Chest Port 1 View  12/24/2015  CLINICAL DATA:  40 year old male with shortness of breath EXAM: PORTABLE CHEST 1 VIEW COMPARISON:  Prior chest x-ray 12/21/2015 FINDINGS: Lower inspiratory volumes with increased bibasilar opacities favored to reflect atelectasis. Low inspiratory volumes  results in pulmonary vascular crowding. Cardiac and mediastinal contours are within normal limits. Calcified granuloma in the left upper lobe, stable. No acute osseous abnormality. No large effusion, pneumothorax or edema. IMPRESSION: Lower inspiratory volumes with increased bibasilar opacities which are favored to reflect atelectasis. Electronically Signed   By: Jacqulynn Cadet M.D.   On: 12/24/2015 08:06   Dg Chest Portable 1 View  12/21/2015  CLINICAL DATA:  Substernal pain epigastric pain beginning tonight. EXAM: PORTABLE CHEST 1 VIEW COMPARISON:  None. FINDINGS: Lungs are adequately inflated without consolidation or effusion. Cardiomediastinal silhouette is within normal. Bones and soft tissues are normal. IMPRESSION: No active disease. Electronically Signed   By: Marin Olp M.D.   On: 12/21/2015 23:49   Dg Abd Portable 1v  12/30/2015  CLINICAL DATA:  eval for position of NG tube - concerned it is too far in EXAM: PORTABLE ABDOMEN - 1 VIEW COMPARISON:  12/29/2015 FINDINGS: Nasal/ orogastric tube passes below the diaphragm. Tip projects in the distal stomach. There is mild gaseous distention of the visualized bowel similar to the prior study. IMPRESSION: Well-positioned nasal/orogastric tube. Electronically Signed   By: Lajean Manes M.D.   On: 12/30/2015 09:07   Dg Abd Portable 1v  12/29/2015  CLINICAL DATA:  NG tube placement. EXAM: PORTABLE ABDOMEN - 1 VIEW COMPARISON:  Yesterday at 2216 hour FINDINGS: Tip and side port of the enteric tube projects over the expected location of the stomach. Mild gaseous distention of large and small bowel loops in the upper abdomen. IMPRESSION: Tip and side port of the enteric tube project over the expected location of the stomach. Electronically Signed   By: Jeb Levering M.D.   On: 12/29/2015 04:21   Dg Abd Portable 1v  12/28/2015  CLINICAL DATA:  Nasogastric tube placement.  Initial encounter. EXAM: PORTABLE ABDOMEN - 1 VIEW COMPARISON:  Abdominal  radiograph performed earlier today at 9:13 p.m. FINDINGS: The patient's enteric tube is seen ending overlying the antrum of the stomach. The visualized bowel  gas pattern is grossly unremarkable, with scattered air filled loops of small bowel seen. No free intra-abdominal air is identified, though evaluation for free air is limited on a single supine view. The visualized lung bases are grossly clear. No acute osseous abnormalities are seen. IMPRESSION: Enteric tube seen ending overlying the antrum of the stomach. Electronically Signed   By: Garald Balding M.D.   On: 12/28/2015 22:32   Dg Abd Portable 1v  12/28/2015  CLINICAL DATA:  Encounter for nasogastric tube placement. EXAM: PORTABLE ABDOMEN - 1 VIEW COMPARISON:  Earlier this day at 1647 hour FINDINGS: The previous weighted enteric tube is no longer seen. A new enteric tube is seen with its tip in the region of distal esophagus, only faintly visualized due to habitus in overlying monitoring devices. Recommend advancement. IMPRESSION: Tip of the enteric tube in the distal esophagus. Recommend advancement of at least 10 cm. Electronically Signed   By: Jeb Levering M.D.   On: 12/28/2015 21:24   Dg Abd Portable 1v  12/28/2015  CLINICAL DATA:  40 year old male post feeding tube placement. Subsequent encounter. EXAM: PORTABLE ABDOMEN - 1 VIEW COMPARISON:  12/25/2015. FINDINGS: Feeding tube tip at the expected level of the distal duodenum/ligament of [place]. Gas-filled prominent size transverse colon. No gross free intraperitoneal narrow although evaluation limited by motion. Remainder of exam limited. IMPRESSION: Feeding tube tip at the expected level of the distal duodenum/ligament of [place]. Electronically Signed   By: Genia Del M.D.   On: 12/28/2015 17:41   Ct Angio Chest/abd/pel For Dissection W And/or W/wo  12/22/2015  CLINICAL DATA:  Chest pressure and epigastric pain for 1 day. Nausea and vomiting with diaphoresis. Clinical concern for  dissection. EXAM: CT ANGIOGRAPHY CHEST, ABDOMEN AND PELVIS TECHNIQUE: Multidetector CT imaging through the chest, abdomen and pelvis was performed using the standard protocol during bolus administration of intravenous contrast. Multiplanar reconstructed images and MIPs were obtained and reviewed to evaluate the vascular anatomy. CONTRAST:  100 cc Isovue 370 IV COMPARISON:  Chest radiograph earlier this day. FINDINGS: CTA CHEST FINDINGS Normal caliber thoracic aorta without aneurysm, dissection, hematoma or acute aortic abnormality. There is a conventional branching pattern from the aortic arch. No significant atherosclerosis. No filling defects in the central pulmonary arteries to suggest pulmonary embolus. Heart is normal in size. Left hilar calcification, sequela of prior granulomatous disease. No pericardial effusion. No adenopathy. Minimal dependent atelectasis in both lower lobes. 3 mm pleural based nodule in the right lower lobe series 5, image 35 is likely a calcified granuloma. 4 mm left upper lobe granuloma, series 5, image 15. No pleural effusion. Mild distal esophageal thickening. Included thyroid gland is normal. There are no acute or suspicious osseous abnormalities. Mild degenerative disc disease in the thoracic spine. Review of the MIP images confirms the above findings. CTA ABDOMEN AND PELVIS FINDINGS Normal caliber abdominal aorta without dissection or aneurysm. No significant atherosclerosis. Celiac, superior mesenteric, and inferior mesenteric arteries are widely patent. Single bilateral renal arteries are patent, early branching of the left renal artery. Bilateral common and external iliac arteries are normal. Extensive peripancreatic fat stranding extending from the head, through the body and tail. There is adjacent peripancreatic free fluid, no loculated fluid collection. Moderate fluid tracks in the right and left anterior perirenal space into the pericolic gutter. Fluid tracks in the left  upper quadrant about the medial spleen. Decreased density of the liver consistent with steatosis. Mild gallbladder distention, no calcified stone. Arterial phase imaging of the spleen and  adrenal glands is normal. Symmetric renal enhancement, no hydronephrosis. Stomach distended with ingested contents. Mild bowel wall thickening of small bowel loops in the left upper quadrant, likely reactive secondary to pancreatic inflammation. No small bowel distention. The appendix is normal. Small volume colonic stool. Minimal diverticulosis of sigmoid colon, no diverticulitis. No free air. No evidence of retroperitoneal or mesenteric adenopathy. In the pelvis the bladder is physiologically distended. Prostate gland normal in size. Fat within both inguinal canals. Scattered degenerative change throughout the lumbar spine with degenerative disc disease at L5-S1 and facet arthropathy most prominent at L3-L4. Review of the MIP images confirms the above findings. IMPRESSION: 1. Normal caliber thoracoabdominal aorta without dissection or acute aortic abnormality. 2. Marked peripancreatic inflammation consistent with pancreatitis. Peripancreatic fluid tracking in the anterior para renal space and both pericolic gutters. 3. Mild gallbladder distention, no calcified stone. Hepatic steatosis. 4. Sequela of granulomatous disease in the chest. Electronically Signed   By: Jeb Levering M.D.   On: 12/22/2015 01:24   US Abdomen Limited Ruq  12/22/2015  CLINICAL DATA:  Initial evaluation for acute chest pain, epigastric pressure, nausea, vomiting. EXAM: US ABDOMEN LIMITED - RIGHT UPPER QUADRANT COMPARISON:  None. FINDINGS: Gallbladder: Multiple echogenic stones present within the gallbladder lumen, largest of which measured 2.4 cm. Small amount of sludge present as well. Gallbladder wall measured within normal limits at 3 mm. No free pericholecystic fluid. No sonographic Murphy sign elicited on exam. Probable focal fatty sparing noted  within the liver adjacent to the gallbladder fossa. Common bile duct: Diameter: 4 mm Liver: Diffusely increased echogenicity, suggestive of steatosis. No focal lesions. IMPRESSION: 1. Cholelithiasis without sonographic evidence for acute cholecystitis or biliary dilatation. 2. Hepatic steatosis. Electronically Signed   By: Jeannine Boga M.D.   On: 12/22/2015 04:04    Lab Data:  CBC:  Recent Labs Lab 12/24/15 0757 12/25/15 0425 12/26/15 0442 12/27/15 0422 12/30/15 0350  WBC 16.9* 12.9* 13.5* 9.7 16.3*  NEUTROABS  --   --  11.2*  --   --   HGB 13.8 12.4* 12.0* 11.3* 11.8*  HCT 42.1 38.2* 36.8* 35.2* 37.5*  MCV 92.7 91.4 91.5 92.9 95.7  PLT 195 223 252 221 878   Basic Metabolic Panel:  Recent Labs Lab 12/26/15 0442 12/27/15 0422 12/28/15 0425 12/29/15 0430 12/30/15 0350  NA 149* 152* 153* 152* 154*  K 3.6 3.5 3.4* 3.3* 3.4*  CL 119* 119* 119* 118* 120*  CO2 20* _0 GLUCOSE 265* 237* 225* 205* 182*  BUN 24* 20 19 21* 26*  CREATININE 1.34* 1.19 1.19 1.19 1.30*  CALCIUM 6.8* 6.9* 7.5* 7.9* 7.7*   GFR: Estimated Creatinine Clearance: 103.8 mL/min (by C-G formula based on Cr of 1.3). Liver Function Tests:  Recent Labs Lab 12/24/15 0304 12/25/15 0425 12/26/15 0442 12/27/15 0422 12/28/15 0425  AST 71* _1 ALT 89* 47 32 24 24  ALKPHOS 54 85 62 57 65  BILITOT 0.9 1.3* 1.4* 1.0 0.9  PROT 5.5* 5.0* 5.2* 4.9* 5.3*  ALBUMIN 2.8* 2.4* 2.2* 2.0* 2.2*    Recent Labs Lab 12/24/15 0304 12/25/15 0425 12/26/15 0442 12/30/15 0350  LIPASE 885* 128* 45 16   No results for input(s): AMMONIA in the last 168 hours. Coagulation Profile: No results for input(s): INR, PROTIME in the last 168 hours. Cardiac Enzymes:  Recent Labs Lab 12/26/15 0622 12/26/15 1114 12/26/15 1709  TROPONINI <0.03 0.03 0.03   BNP (last 3 results) No results for input(s):  PROBNP in the last 8760 hours. HbA1C: No results for input(s): HGBA1C in the last 72  hours. CBG:  Recent Labs Lab 12/29/15 1118 12/29/15 1558 12/29/15 1940 12/29/15 2351 12/30/15 0735  GLUCAP 199* 248* 236* 203* 192*   Lipid Profile: No results for input(s): CHOL, HDL, LDLCALC, TRIG, CHOLHDL, LDLDIRECT in the last 72 hours. Thyroid Function Tests: No results for input(s): TSH, T4TOTAL, FREET4, T3FREE, THYROIDAB in the last 72 hours. Anemia Panel: No results for input(s): VITAMINB12, FOLATE, FERRITIN, TIBC, IRON, RETICCTPCT in the last 72 hours. Urine analysis: No results found for: COLORURINE, APPEARANCEUR, LABSPEC, PHURINE, GLUCOSEU, HGBUR, BILIRUBINUR, KETONESUR, PROTEINUR, UROBILINOGEN, NITRITE, Corliss Skains M.D. [facility] 12/30/2015, 11:40 AM  Pager: [phone] Between 7am to 7pm - call Pager - [phone]  After 7pm go to [email or web address] - password [email or web address]  Call night coverage person covering after 7pm

## 2015-12-30 NOTE — Progress Notes (Addendum)
Nutrition Follow-up  DOCUMENTATION CODES:   Obesity unspecified  INTERVENTION:   -If TF re-started, recommend:  Initiate Vital AF 1.2 @ 20 ml/hr via post-pyloric tube and increase by 10 ml every 4 hours to goal rate of 75 ml/hr.    Tube feeding regimen provides 2160 kcal (98% of needs), 135 grams of protein, and 1460 ml of H2O.   NUTRITION DIAGNOSIS:   Inadequate oral intake related to inability to eat as evidenced by NPO status.  Ongoing  GOAL:   Patient will meet greater than or equal to 90% of their needs  Unmet  MONITOR:   Diet advancement, Labs, Weight trends, Skin, I & O's  REASON FOR ASSESSMENT:   Ventilator    ASSESSMENT:   40yo male with hx HTN admitted 5/23 with acute pancreatitis likely r/t gallstones. Tx ICU 5/25 for worsening SIRS, respiratory distress.   Pt extubated on 12/27/15. Pt was transferred from ICU to SDU on 12/29/15.   Trickle feedings via cortrack post-pyloric tube were initiated on 12/26/15 (Vital 1.5 @ 10 ml/hr, which provides 360 kcals, 16 grams protein, which meets 16% of estimated kcal needs and 13% of estimated protein needs).   Per RN notes, TF has been on hold since 12/28/15, due to pt pulling out both cortrak tube and NGT. NGT for low intermittent suction has been replaced; noted >1000 ml output within the past 24 hours.   Per surgical notes, pt with severe gallstone pancreatitis with plans for cholecystectomy in approximately 4-6 weeks. Considering either post-pyloric TF or TPN.   Case discussed with RN. She reveals that NGT was replaced and placement was confirmed in stomach via KUB. RN securing tube at time of visit. There are no current orders to replace cortrak tube (pt currently with no feeding access); RN unsure if TF or TPN will be initiated.   Labs reviewed: Na: 154, K: 3.4, CBGS: 192-136.   Diet Order:  Diet NPO time specified Except for: Other (See Comments)  Skin:  Reviewed, no issues  Last BM:  12/29/15  Height:    Ht Readings from Last 1 Encounters:  12/24/15 5\' 11"  (1.803 m)    Weight:   Wt Readings from Last 1 Encounters:  12/28/15 291 lb 10.7 oz (132.3 kg)    Ideal Body Weight:  78.2 kg  BMI:  Body mass index is 40.7 kg/(m^2).  Estimated Nutritional Needs:   Kcal:  2200-2400  Protein:  125-140 grams  Fluid:  2.2-2.4 L  EDUCATION NEEDS:   No education needs identified at this time  Dvante Hands A. Mayford KnifeWilliams, RD, LDN, CDE Pager: [phone] After hours Pager: [phone]

## 2015-12-30 NOTE — Progress Notes (Signed)
Pt NG tube was clamped at 1240 as ordered, at 1635 pt's wife had given pt the clear liquid tray that had been delivered earlier but pt could not have x4hrs for time period MD had ordered to clamp NG  and if pt tolerated without N/V, pain, etc, he could start sips of clears.   Checked with pt over the 4 hr period and pt had no symptoms, at this time pt has had some of the clear liquids approx 280cc and denies any nausea, pain, or distention. Will monitor and continue to keep NG tube clamped.

## 2015-12-31 ENCOUNTER — Encounter (HOSPITAL_COMMUNITY): Payer: Self-pay | Admitting: *Deleted

## 2015-12-31 ENCOUNTER — Inpatient Hospital Stay (HOSPITAL_COMMUNITY): Payer: Federal, State, Local not specified - PPO

## 2015-12-31 LAB — C DIFFICILE QUICK SCREEN W PCR REFLEX
C Diff antigen: NEGATIVE
C Diff interpretation: NEGATIVE
C Diff toxin: NEGATIVE

## 2015-12-31 LAB — BASIC METABOLIC PANEL
Anion gap: 8 (ref 5–15)
BUN: 24 mg/dL — ABNORMAL HIGH (ref 6–20)
CO2: 27 mmol/L (ref 22–32)
Calcium: 7.4 mg/dL — ABNORMAL LOW (ref 8.9–10.3)
Chloride: 110 mmol/L (ref 101–111)
Creatinine, Ser: 1.36 mg/dL — ABNORMAL HIGH (ref 0.61–1.24)
GFR calc Af Amer: >60 mL/min (ref >60)
GFR calc non Af Amer: >60 mL/min (ref >60)
Glucose, Bld: 294 mg/dL — ABNORMAL HIGH (ref 65–99)
Potassium: 3.1 mmol/L — ABNORMAL LOW (ref 3.5–5.1)
Sodium: 145 mmol/L (ref 135–145)

## 2015-12-31 LAB — GLUCOSE, CAPILLARY
Glucose-Capillary: 295 mg/dL — ABNORMAL HIGH (ref 65–99)
Glucose-Capillary: 262 mg/dL — ABNORMAL HIGH (ref 65–99)
Glucose-Capillary: 230 mg/dL — ABNORMAL HIGH (ref 65–99)
Glucose-Capillary: 409 mg/dL — ABNORMAL HIGH (ref 65–99)
Glucose-Capillary: 319 mg/dL — ABNORMAL HIGH (ref 65–99)
Glucose-Capillary: 296 mg/dL — ABNORMAL HIGH (ref 65–99)

## 2015-12-31 MED ORDER — INSULIN GLARGINE 100 UNIT/ML ~~LOC~~ SOLN
10.0000 [IU] | Freq: Once | SUBCUTANEOUS | Status: AC
  Administered 2015-12-31: 10 [IU] via SUBCUTANEOUS
  Filled 2015-12-31: qty 0.1

## 2015-12-31 MED ORDER — POTASSIUM CHLORIDE CRYS ER 20 MEQ PO TBCR
40.0000 meq | EXTENDED_RELEASE_TABLET | Freq: Once | ORAL | Status: AC
  Administered 2015-12-31: 40 meq via ORAL
  Filled 2015-12-31: qty 2

## 2015-12-31 MED ORDER — HYDRALAZINE HCL 25 MG PO TABS
25.0000 mg | ORAL_TABLET | Freq: Three times a day (TID) | ORAL | Status: DC
Start: 2015-12-31 — End: 2016-01-02
  Administered 2015-12-31 – 2016-01-02 (×6): 25 mg via ORAL
  Filled 2015-12-31 (×6): qty 1

## 2015-12-31 MED ORDER — IOPAMIDOL (ISOVUE-300) INJECTION 61%
INTRAVENOUS | Status: AC
  Administered 2015-12-31: 100 mL
  Filled 2015-12-31: qty 100

## 2015-12-31 MED ORDER — METOPROLOL TARTRATE 25 MG PO TABS
25.0000 mg | ORAL_TABLET | Freq: Two times a day (BID) | ORAL | Status: DC
  Administered 2015-12-31 – 2016-01-01 (×3): 25 mg via ORAL
  Filled 2015-12-31 (×3): qty 1

## 2015-12-31 MED ORDER — INSULIN GLARGINE 100 UNIT/ML ~~LOC~~ SOLN
10.0000 [IU] | Freq: Every day | SUBCUTANEOUS | Status: DC
  Administered 2015-12-31: 10 [IU] via SUBCUTANEOUS
  Filled 2015-12-31 (×2): qty 0.1

## 2015-12-31 MED ORDER — DIATRIZOATE MEGLUMINE & SODIUM 66-10 % PO SOLN
30.0000 mL | Freq: Once | ORAL | Status: AC
  Administered 2015-12-31: 30 mL via ORAL
  Filled 2015-12-31: qty 30

## 2015-12-31 MED ORDER — INSULIN ASPART 100 UNIT/ML ~~LOC~~ SOLN
5.0000 [IU] | Freq: Three times a day (TID) | SUBCUTANEOUS | Status: DC
  Administered 2015-12-31 – 2016-01-05 (×15): 5 [IU] via SUBCUTANEOUS

## 2015-12-31 MED ORDER — INSULIN GLARGINE 100 UNIT/ML ~~LOC~~ SOLN
20.0000 [IU] | Freq: Every day | SUBCUTANEOUS | Status: DC
  Administered 2016-01-01: 20 [IU] via SUBCUTANEOUS
  Filled 2015-12-31: qty 0.2

## 2015-12-31 MED ORDER — GLUCERNA SHAKE PO LIQD: 237.0000 mL | Freq: Every day | ORAL | Status: DC

## 2015-12-31 MED ORDER — INSULIN ASPART 100 UNIT/ML ~~LOC~~ SOLN
0.0000 [IU] | Freq: Three times a day (TID) | SUBCUTANEOUS | Status: DC
  Administered 2015-12-31: 15 [IU] via SUBCUTANEOUS
  Administered 2015-12-31 – 2016-01-01 (×3): 11 [IU] via SUBCUTANEOUS
  Administered 2016-01-01: 8 [IU] via SUBCUTANEOUS
  Administered 2016-01-02: 5 [IU] via SUBCUTANEOUS
  Administered 2016-01-02: 8 [IU] via SUBCUTANEOUS
  Administered 2016-01-02: 5 [IU] via SUBCUTANEOUS
  Administered 2016-01-03: 3 [IU] via SUBCUTANEOUS
  Administered 2016-01-03: 5 [IU] via SUBCUTANEOUS
  Administered 2016-01-03: 3 [IU] via SUBCUTANEOUS
  Administered 2016-01-04: 5 [IU] via SUBCUTANEOUS
  Administered 2016-01-04 (×2): 8 [IU] via SUBCUTANEOUS
  Administered 2016-01-05: 15 [IU] via SUBCUTANEOUS
  Administered 2016-01-05: 11 [IU] via SUBCUTANEOUS

## 2015-12-31 MED ORDER — INSULIN ASPART 100 UNIT/ML ~~LOC~~ SOLN
0.0000 [IU] | Freq: Every day | SUBCUTANEOUS | Status: DC
  Administered 2015-12-31: 4 [IU] via SUBCUTANEOUS
  Administered 2016-01-01: 2 [IU] via SUBCUTANEOUS
  Administered 2016-01-02: 3 [IU] via SUBCUTANEOUS
  Administered 2016-01-03: 2 [IU] via SUBCUTANEOUS
  Administered 2016-01-04: 3 [IU] via SUBCUTANEOUS

## 2015-12-31 NOTE — Progress Notes (Signed)
Spoke w pt and wife. phy there rec hhpt and rolling walker. Explained role of case Production designer, theatre/television/filmmanager. Will cont to follow and assist as pt gets closer to disch.

## 2015-12-31 NOTE — Progress Notes (Signed)
Nutrition Follow-up  DOCUMENTATION CODES:   Obesity unspecified  INTERVENTION:   -Glucerna Shake po q HS, each supplement provides 220 kcal and 10 grams of protein  NUTRITION DIAGNOSIS:   Increased nutrient needs related to acute illness as evidenced by estimated needs.  Ongoing  GOAL:   Patient will meet greater than or equal to 90% of their needs  Progressing  MONITOR:   PO intake, Supplement acceptance, Diet advancement, Labs, Weight trends, Skin, I & O's  REASON FOR ASSESSMENT:   Ventilator    ASSESSMENT:   40yo male with hx HTN admitted 5/23 with acute pancreatitis likely r/t gallstones. Tx ICU 5/25 for worsening SIRS, respiratory distress.   Pt sitting in recliner at time of visit; just finished working with physical therapy.   Pt underwent clamping trials on 12/30/15. Pt pulled NGT out earlier this AM.   Pt has now been advanced to a full liquid diet, with plans to advanced to soft, carb modified diet as tolerated. Pt with increased nutritional needs related to severe pancreatitis. RD will add supplement to ensure optimal nutritional intake.   Labs reviewed: CBGS: 230-383, K: 3.1.   Diet Order:  Diet full liquid Room service appropriate?: Yes; Fluid consistency:: Thin  Skin:  Reviewed, no issues  Last BM:  12/31/15  Height:   Ht Readings from Last 1 Encounters:  12/24/15 5\' 11"  (1.803 m)    Weight:   Wt Readings from Last 1 Encounters:  12/28/15 291 lb 10.7 oz (132.3 kg)    Ideal Body Weight:  78.2 kg  BMI:  Body mass index is 40.7 kg/(m^2).  Estimated Nutritional Needs:   Kcal:  2200-2400  Protein:  125-140 grams  Fluid:  2.2-2.4 L  EDUCATION NEEDS:   No education needs identified at this time  Tagen Milby A. Mayford KnifeWilliams, RD, LDN, CDE Pager: [phone] After hours Pager: [phone]

## 2015-12-31 NOTE — Progress Notes (Signed)
Results for Carolin CoyHOLDREN, Aashrith (MRN [id number]) as of 12/31/2015 10:34  Ref. Range 12/30/2015 16:46 12/30/2015 19:48 12/30/2015 23:28 12/31/2015 03:37 12/31/2015 08:08  Glucose-Capillary Latest Ref Range: 65-99 mg/dL 782318 (H) 956383 (H) 213295 (H) 262 (H) 230 (H)  Received Diabetes Coordinator consult.  CBGs continue to be greater than 180 mg/dl. Recommend increasing Lantus to 25-30 units daily (132 kg X 0.2 units/kg=26) and continue Novolog MODERATE correction scale TID & HS. Will continue to monitor blood sugars while in the hospital. Smith MinceKendra Tauno Falotico RN BSN CDE

## 2015-12-31 NOTE — Progress Notes (Signed)
Physical Therapy Treatment Patient Details Name: Timothy CoyQuinten Gerardo MRN: [id number] DOB: Nov 18, 1975 Today's Date: 12/31/2015    History of Present Illness Pt is a 40 yo male admitted for pancreatitis, sepsis; developed respiratory distress, worsening tachycardia; transferred to ICU intubated 5/25-5/28. PMH: low back surgery in 2007.    PT Comments    Patient remains limited by symptomatic orthostasis (supine 173/92, seated 142/83, seated after 3 minutes and exercises 142/99). Assisted to chair and encouraged OOB as much as possible. RN informed.   Follow Up Recommendations  Home health PT;Supervision/Assistance - 24 hour     Equipment Recommendations  Rolling walker with 5" wheels    Recommendations for Other Services       Precautions / Restrictions Precautions Precautions: Fall Restrictions Weight Bearing Restrictions: No    Mobility  Bed Mobility Overal bed mobility: Needs Assistance Bed Mobility: Supine to Sit;Sit to Supine     Supine to sit: HOB elevated;Min assist;+2 for safety/equipment     General bed mobility comments: used bed rail, assist for trunk elevation  Transfers Overall transfer level: Needs assistance Equipment used: 2 person hand held assist Transfers: Sit to/from [employer] CorporationStand;Stand Pivot Transfers Sit to Stand: Min assist;+2 physical assistance;+2 safety/equipment Stand pivot transfers: Min assist;+2 safety/equipment       General transfer comment: pt unsteady, wide base of support, +dizziness  Ambulation/Gait             General Gait Details: unable orthostatis   Stairs            Wheelchair Mobility    Modified Rankin (Stroke Patients Only)       Balance     Sitting balance-Leahy Scale: Fair     Standing balance support: Single extremity supported Standing balance-Leahy Scale: Poor Standing balance comment: dizziness, weak, requires UE support                    Cognition Arousal/Alertness: Awake/alert Behavior  During Therapy: WFL for tasks assessed/performed Overall Cognitive Status: Within Functional Limits for tasks assessed                      Exercises General Exercises - Lower Extremity Ankle Circles/Pumps: AROM;Both;10 reps Long Arc Quad: AROM;Both;10 reps Other Exercises Other Exercises: hand pumps for edema and BP support Other Exercises: "punches" at chest level while seated for BP support Other Exercises: provided handout for bedlevel and chair exercises    General Comments        Pertinent Vitals/Pain Pain Assessment: No/denies pain    Home Living                      Prior Function            PT Goals (current goals can now be found in the care plan section) Acute Rehab PT Goals Patient Stated Goal: move Time For Goal Achievement: 01/04/16 Progress towards PT goals: Not progressing toward goals - comment (orthostasis)    Frequency  Min 3X/week    PT Plan Current plan remains appropriate    Co-evaluation             End of Session Equipment Utilized During Treatment: Gait belt Activity Tolerance: No increased pain;Treatment limited secondary to medical complications (Comment) (orthostasis) Patient left: with call bell/phone within reach;in chair     Time: [id number] PT Time Calculation (min) (ACUTE ONLY): 15 min  Charges:  $Therapeutic Activity: 8-22 mins  G Codes:      Kazue Cerro 12/31/2015, 10:08 AM Pager [phone]

## 2015-12-31 NOTE — Progress Notes (Signed)
Pt's K+ came back 3.1 this morning, paged on-call [facility] provider. No new orders given, she wanted to wait for the day shift MD to come and assess pt. Will pass along to day shift RN.

## 2015-12-31 NOTE — Progress Notes (Signed)
12/31/15  0720  Charge RN to monitor CVP lines. Charge RN aware and states she will monitor patient CVP.

## 2015-12-31 NOTE — Progress Notes (Signed)
NURSING PROGRESS NOTE  Carolin CoyQuinten Hodak [id number] Transfer Data: 12/31/2015 8:19 PM Attending Provider: Cathren Harshipudeep K Rai, MD WUJ:[id number]:Victoria Blane Ohara Rankins, MD Code Status: Full   Carolin CoyQuinten Bouwens is a 40 y.o. male patient transferred from 2 H  -No acute distress noted.  -No complaints of shortness of breath.  -No complaints of chest pain.   Last Documented Vital Signs: Blood pressure 157/94, pulse 99, temperature 98 F (36.7 C), temperature source Oral, resp. rate 18, height 5\' 11"  (1.803 m), weight 122.471 kg (270 lb), SpO2 95 %.  IV Fluids:  IV in place, occlusive dsg intact without redness, IV cath forearm right, condition patent and no redness and internal jugular left, condition patent and no redness none.   Allergies:  Review of patient's allergies indicates no known allergies.  Past Medical History:   has a past medical history of Hypertension; Pancreatitis, acute (12/22/2015); AKI (acute kidney injury) (HCC) (12/22/2015); and Anxiety.  Past Surgical History:   has past surgical history that includes Back surgery.  Social History:   reports that he has never smoked. He has never used smokeless tobacco. He reports that he drinks alcohol. He reports that he does not use illicit drugs.  Skin: intact except where otherwise charted  Patient/Family orientated to room. Information packet given to patient/family. Admission inpatient armband information verified with patient/family to include name and date of birth and placed on patient arm. Side rails up x 2, fall assessment and education completed with patient/family. Patient/family able to verbalize understanding of risk associated with falls and verbalized understanding to call for assistance before getting out of bed. Call light within reach. Patient/family able to voice and demonstrate understanding of unit orientation instructions.

## 2015-12-31 NOTE — Progress Notes (Signed)
12/31/15  1106  Notified Brooke in CT that patient now has a #22 IV in rt forearm.

## 2015-12-31 NOTE — Progress Notes (Signed)
Pharmacy Antibiotic Note  Timothy [name] is a 40 y.o. male Timothy [name] on 12/21/2015 with acute pancreatitis.    Day # 9 of Primaxin therapy (5/24>>  ) Cultures negative Afebrile   Plan: Continue imipenem 500 mg IV q6h Add stop date?   Height: 5\' 11"  (180.3 cm) Weight: 291 lb 10.7 oz (132.3 kg) IBW/kg (Calculated) : 75.3  Temp (24hrs), Avg:98.3 F (36.8 C), Min:97.5 F (36.4 C), Max:99.8 F (37.7 C)   Recent Labs Lab 12/24/15 1150 12/25/15 0425 12/26/15 0442 12/27/15 0422 12/28/15 0425 12/29/15 0430 12/30/15 0350 12/31/15 0430  WBC  --  12.9* 13.5* 9.7  --   --  16.3*  --   CREATININE  --  1.46* 1.34* 1.19 1.19 1.19 1.30* 1.36*  LATICACIDVEN 1.8  --   --   --   --   --   --   --     Estimated Creatinine Clearance: 99.2 mL/min (by C-G formula based on Cr of 1.36).    No Known Allergies  Antimicrobials this admission: Imipenem 5/24 >>  Vanc 5/25 >> 5/27  Thank you Timothy [name], PharmD [phone]  12/31/2015 10:29 AM

## 2015-12-31 NOTE — Progress Notes (Signed)
12/31/15 1202  Notified Dr Isidoro Donningai of patient's BS 409.

## 2015-12-31 NOTE — Progress Notes (Signed)
Pt pulled out NG tube, has been free of N/V and abd pain. Pt drank multiple cups of water throughout the night with no complaints. Will continue to monitor and pass along to day-shift RN.

## 2015-12-31 NOTE — Progress Notes (Signed)
[facility]                                                                              Patient Demographics  Timothy [name], is a 40 y.o. male, DOB - [birth year], WUJ:[id number]  Admit date - 12/21/2015   Admitting Physician Timothy Litter, MD  Outpatient Primary MD for the patient is Timothy Heron, MD  Outpatient specialists:   LOS - 9  days    Chief Complaint  Patient presents with  . Chest Pain  . Abdominal Pain       Brief summary   40 year old male with history of hypertension, presented to the emergency department with complaints of epigastric pain, nausea and vomiting. Patient admitted for sepsis/acute pancreatitis abnormal LFTs. Right upper quadrant ultrasound showed cholelithiasis without evidence of acute cholecystitis or biliary dilatation. CTA chest, abdomen and pelvis, showed. Pancreatic inflammation consistent with pancreatitis, per pancreatic fluid tracking in the anterior pararenal space and both pericolic gutters, mild gallbladder distention no calcified stone. Surgery was consulted for possible laparoscopic cholecystectomy. On 5/25, patient, went into acute respiratory distress, worsening tachycardia and CCM was consulted. Patient was transferred to ICU and was intubated. Patient now back to stepdown unit, [facility] assumed care on 5/31  Assessment & Plan   Acute severe pancreatitis: With shock/sepsis, acute respiratory failure complicated with ileus - Lipase improved, Tolerated NG clamping, clear liquid diet. Patient pulled his NG tube out yesterday - Advance diet to full liquids and will advance to soft, modified in the evening if tolerating full liquids - Per general surgery, not a candidate for cholecystectomy at this time while inpatient however can have elective cholecystectomy in future - Obtain repeat CT abdomen and pelvis with contrast today - Out of bed, ambulate, PT  Acute encephalopathy with acute respiratory failure  - Currently  improved, alert and oriented 3 - Limit benzodiazepine or any sedating medications. Per CCM may be able to use Haldol or Seroquel for delirium - Extubated, O2 sats 97% on room air  Hypernatremia -Improving, discontinue IV fluids  - Diet advanced   AKI (acute kidney injury) (HCC) with lactic acidosis, metabolic acidosis - Crcurrently stable   Accelerated hypertension - Now tolerating diet, placed on oral scheduled hydralazine, beta blocker and continue clonidine patch   Leukocytosis - Likely due to #1, blood cultures negative so far, continue imipenemFollow CBC   Hyperglycemia -Repeat hemoglobin A1c, placed on sliding scale insulin moderate and Lantus 20 units daily   Obesity - Diet and weight control  Moderate to Severe protein calorie malnutrition - Advancing diet  Code Status:Full CODE STATUS   DVT Prophylaxis: Lovenox   Family Communication: Discussed in detail with the patient, all imaging results, lab results explained to the patient  Disposition Plan: Transfer to telemetry  Time Spent in minutes 25 minutes  Procedures:  Ultrasound abdomen CT chest/abdomen Intubation, extubation  Consultants:  Gen. Surgery CCM  Antimicrobials:  Imipenem 5/24>>   Medications  Scheduled Meds: . antiseptic oral rinse  7 mL Mouth Rinse BID  . cloNIDine  0.2 mg Transdermal Weekly  . feeding supplement (GLUCERNA SHAKE)  237 mL Oral QHS  . heparin  subcutaneous  5,000 Units Subcutaneous Q8H  . hydrALAZINE  25 mg Oral Q8H  . imipenem-cilastatin  500 mg Intravenous Q6H  . insulin aspart  0-15 Units Subcutaneous TID WC  . insulin aspart  0-5 Units Subcutaneous QHS  . insulin glargine  10 Units Subcutaneous Once  . [START ON 01/01/2016] insulin glargine  20 Units Subcutaneous Daily  . metoprolol tartrate  25 mg Oral BID  . pantoprazole  40 mg Oral QHS  . sodium chloride flush  10-40 mL Intracatheter Q12H   Continuous Infusions: . dextrose 50 mL/hr at 12/31/15 0927     PRN Meds:.acetaminophen, diphenhydrAMINE, hydrALAZINE, nitroGLYCERIN, ondansetron **OR** ondansetron (ZOFRAN) IV, sodium chloride flush   Antibiotics   Anti-infectives    Start     Dose/Rate Route Frequency Ordered Stop   12/26/15 1530  erythromycin ethylsuccinate (EES) 200 MG/5ML suspension 200 mg     200 mg Per Tube  Once 12/26/15 1520 12/26/15 1646   12/24/15 1200  imipenem-cilastatin (PRIMAXIN) 500 mg in sodium chloride 0.9 % 100 mL IVPB     500 mg 200 mL/hr over 30 Minutes Intravenous Every 6 hours 12/24/15 0913     12/24/15 0930  vancomycin (VANCOCIN) IVPB 1000 mg/200 mL premix  Status:  Discontinued     1,000 mg 200 mL/hr over 60 Minutes Intravenous Every 12 hours 12/24/15 0913 12/26/15 0937   12/23/15 0800  imipenem-cilastatin (PRIMAXIN) 500 mg in sodium chloride 0.9 % 100 mL IVPB  Status:  Discontinued     500 mg 200 mL/hr over 30 Minutes Intravenous Every 8 hours 12/23/15 0705 12/24/15 0913        Subjective:   Timothy [name] was seen and examined today.Feel better today, pulled out his NG tube yesterday, tolerating clear liquid diet. No abdominal pain, nausea or vomiting. No fevers or chills. Patient denies dizziness, chest pain, shortness of breath,  new weakness, numbess, tingling. No acute events overnight.    Objective:   Filed Vitals:   12/31/15 0328 12/31/15 0445 12/31/15 0809 12/31/15 1020  BP:  171/95 175/92 155/84  Pulse: 99     Temp: 97.8 F (36.6 C)  98 F (36.7 C)   TempSrc: Oral  Oral   Resp: 18  18   Height:      Weight:      SpO2: 96%  95%     Intake/Output Summary (Last 24 hours) at 12/31/15 1107 Last data filed at 12/31/15 0700  Gross per 24 hour  Intake   3460 ml  Output    300 ml  Net   3160 ml     Wt Readings from Last 3 Encounters:  12/28/15 132.3 kg (291 lb 10.7 oz)     Exam  General: Alert and oriented x 3, NAD  HEENT:    Neck: Supple, no JVD  Cardiovascular: S1 S2 clear, tachycardia  Respiratory: CTAB, no  wheezing  Gastrointestinal: Soft, nt, nd, nbs  Ext: no cyanosis clubbing or edema  Neuro: no new deficits  Skin: No rashes  Psych: Normal affect and demeanor, alert and oriented x3    Data Reviewed:  I have personally reviewed following labs and imaging studies  Micro Results Recent Results (from the past 240 hour(s))  Blood culture (routine x 2)     Status: None   Collection Time: 12/22/15 12:15 AM  Result Value Ref Range Status   Specimen Description BLOOD RIGHT ANTECUBITAL  Final   Special Requests BOTTLES DRAWN AEROBIC AND ANAEROBIC 5CC  Final  Culture NO GROWTH 5 DAYS  Final   Report Status 12/27/2015 FINAL  Final  Blood culture (routine x 2)     Status: None   Collection Time: 12/22/15 12:20 AM  Result Value Ref Range Status   Specimen Description BLOOD LEFT ANTECUBITAL  Final   Special Requests BOTTLES DRAWN AEROBIC AND ANAEROBIC 5CC  Final   Culture NO GROWTH 5 DAYS  Final   Report Status 12/27/2015 FINAL  Final  MRSA PCR Screening     Status: None   Collection Time: 12/22/15  4:00 PM  Result Value Ref Range Status   MRSA by PCR NEGATIVE NEGATIVE Final    Comment:        The GeneXpert MRSA Assay (FDA approved for NASAL specimens only), is one component of a comprehensive MRSA colonization surveillance program. It is not intended to diagnose MRSA infection nor to guide or monitor treatment for MRSA infections.     Radiology Reports Dg Abd 1 View  12/25/2015  CLINICAL DATA:  Feeding tube placement EXAM: ABDOMEN - 1 VIEW COMPARISON:  None. FINDINGS: Feeding tube has been placed. Tip overlies the level of the descending portion of the duodenum. There is mild patient motion artifact, limiting detail. IMPRESSION: Feeding tube tip overlying the level of the descending portion the duodenum. Electronically Signed   By: Norva PavlovElizabeth  Brown M.D.   On: 12/25/2015 14:41   Dg Chest Port 1 View  12/29/2015  CLINICAL DATA:  Respiratory failure. EXAM: PORTABLE CHEST 1 VIEW  COMPARISON:  11/27/2015. FINDINGS: Endotracheal tube tip not definitely identified over the trachea. Clinical correlation suggested . Following discussion of this finding with the patient's nurse I was informed that the patient has been extubated. Tubing over the upper chest may be related oxygen therapy . NG tube noted below left hemidiaphragm. Left IJ line in stable position . Heart size stable. Low lung volumes with bibasilar atelectasis. Left lower lobe infiltrate cannot be excluded. Stable left upper lobe calcified nodule consistent with granuloma. No pleural effusion or pneumothorax . IMPRESSION: 1. Endotracheal tube tip not definitely identified over the trachea. Clinical correlation suggested. Following discussion of this finding with the patient's nurse I was informed that the patient has been extubated. Tubing over the upper chest may be related to oxygen therapy. NG tube and left IJ line in stable position. 2. Low lung volumes with bibasilar atelectasis. Left lower lobe infiltrate cannot be excluded. Critical Value/emergent results were called by telephone at the time of interpretation on 12/29/2015 at 6:57 am to the patient's nurse who verbally acknowledged these results. Electronically Signed   By: Maisie Fushomas  Register   On: 12/29/2015 07:02   Dg Chest Port 1 View  12/27/2015  CLINICAL DATA:  Acute respiratory failure. EXAM: PORTABLE CHEST 1 VIEW COMPARISON:  Yesterday. FINDINGS: Endotracheal tube in satisfactory position. Nasogastric tube extending into the stomach. A feeding tube is also seen extending into the distal esophagus, not visualized more distally due to thickness of overlying soft tissues. Left jugular catheter tip in the proximal superior vena cava. Minimal left basilar airspace opacity with improvement and stable linear density in the right lower lung zone. The heart remains grossly normal in size. Small bilateral calcified granulomata are noted. Unremarkable bones. IMPRESSION: 1. Minimal  left basilar atelectasis or pneumonia with improvement. 2. Small amount of linear atelectasis or pleural fluid in the minor fissure in the right lower lung zone. Electronically Signed   By: Beckie SaltsSteven  Reid M.D.   On: 12/27/2015 07:50   Dg  Chest Port 1 View  12/26/2015  CLINICAL DATA:  Pancreatitis EXAM: PORTABLE CHEST 1 VIEW COMPARISON:  12/24/2015 FINDINGS: Bilateral mild interstitial thickening. No pleural effusion or pneumothorax. Relatively low lung volumes. Stable cardiomediastinal silhouette. Endotracheal tube with the tip 4 cm above the carina. Nasogastric tube coursing below the diaphragm. Left IJ central venous catheter with the tip projecting over the confluence of the SVC/left brachiocephalic vein. IMPRESSION: 1. Stable support lines and tubing. 2. Mild pulmonary vascular congestion. Electronically Signed   By: Elige Ko   On: 12/26/2015 09:25   Dg Chest Port 1 View  12/24/2015  CLINICAL DATA:  Central line placement EXAM: PORTABLE CHEST 1 VIEW COMPARISON:  12/24/2015 FINDINGS: Borderline cardiomegaly. Endotracheal tube in place with tip 3.2 cm above the carina. NG tube in place. Central mild vascular congestion and mild perihilar interstitial prominence suspicious for mild interstitial edema. Left IJ central line with tip in SVC. No pneumothorax. Mild basilar atelectasis. IMPRESSION: Borderline cardiomegaly. Endotracheal tube and NG tube in place. Left IJ central line with tip in SVC. No pneumothorax. Mild basilar atelectasis. Central mild vascular congestion and mild perihilar interstitial prominence suspicious for mild interstitial edema pre Electronically Signed   By: Natasha Mead M.D.   On: 12/24/2015 10:27   Dg Chest Port 1 View  12/24/2015  CLINICAL DATA:  40 year old male with shortness of breath EXAM: PORTABLE CHEST 1 VIEW COMPARISON:  Prior chest x-ray 12/21/2015 FINDINGS: Lower inspiratory volumes with increased bibasilar opacities favored to reflect atelectasis. Low inspiratory volumes  results in pulmonary vascular crowding. Cardiac and mediastinal contours are within normal limits. Calcified granuloma in the left upper lobe, stable. No acute osseous abnormality. No large effusion, pneumothorax or edema. IMPRESSION: Lower inspiratory volumes with increased bibasilar opacities which are favored to reflect atelectasis. Electronically Signed   By: Malachy Moan M.D.   On: 12/24/2015 08:06   Dg Chest Portable 1 View  12/21/2015  CLINICAL DATA:  Substernal pain epigastric pain beginning tonight. EXAM: PORTABLE CHEST 1 VIEW COMPARISON:  None. FINDINGS: Lungs are adequately inflated without consolidation or effusion. Cardiomediastinal silhouette is within normal. Bones and soft tissues are normal. IMPRESSION: No active disease. Electronically Signed   By: Elberta Fortis M.D.   On: 12/21/2015 23:49   Dg Abd Portable 1v  12/30/2015  CLINICAL DATA:  eval for position of NG tube - concerned it is too far in EXAM: PORTABLE ABDOMEN - 1 VIEW COMPARISON:  12/29/2015 FINDINGS: Nasal/ orogastric tube passes below the diaphragm. Tip projects in the distal stomach. There is mild gaseous distention of the visualized bowel similar to the prior study. IMPRESSION: Well-positioned nasal/orogastric tube. Electronically Signed   By: Amie Portland M.D.   On: 12/30/2015 09:07   Dg Abd Portable 1v  12/29/2015  CLINICAL DATA:  NG tube placement. EXAM: PORTABLE ABDOMEN - 1 VIEW COMPARISON:  Yesterday at 2216 hour FINDINGS: Tip and side port of the enteric tube projects over the expected location of the stomach. Mild gaseous distention of large and small bowel loops in the upper abdomen. IMPRESSION: Tip and side port of the enteric tube project over the expected location of the stomach. Electronically Signed   By: Rubye Oaks M.D.   On: 12/29/2015 04:21   Dg Abd Portable 1v  12/28/2015  CLINICAL DATA:  Nasogastric tube placement.  Initial encounter. EXAM: PORTABLE ABDOMEN - 1 VIEW COMPARISON:  Abdominal  radiograph performed earlier today at 9:13 p.m. FINDINGS: The patient's enteric tube is seen ending overlying the antrum of  the stomach. The visualized bowel gas pattern is grossly unremarkable, with scattered air filled loops of small bowel seen. No free intra-abdominal air is identified, though evaluation for free air is limited on a single supine view. The visualized lung bases are grossly clear. No acute osseous abnormalities are seen. IMPRESSION: Enteric tube seen ending overlying the antrum of the stomach. Electronically Signed   By: Roanna Raider M.D.   On: 12/28/2015 22:32   Dg Abd Portable 1v  12/28/2015  CLINICAL DATA:  Encounter for nasogastric tube placement. EXAM: PORTABLE ABDOMEN - 1 VIEW COMPARISON:  Earlier this day at 1647 hour FINDINGS: The previous weighted enteric tube is no longer seen. A new enteric tube is seen with its tip in the region of distal esophagus, only faintly visualized due to habitus in overlying monitoring devices. Recommend advancement. IMPRESSION: Tip of the enteric tube in the distal esophagus. Recommend advancement of at least 10 cm. Electronically Signed   By: Rubye Oaks M.D.   On: 12/28/2015 21:24   Dg Abd Portable 1v  12/28/2015  CLINICAL DATA:  40 year old male post feeding tube placement. Subsequent encounter. EXAM: PORTABLE ABDOMEN - 1 VIEW COMPARISON:  12/25/2015. FINDINGS: Feeding tube tip at the expected level of the distal duodenum/ligament of Treitz. Gas-filled prominent size transverse colon. No gross free intraperitoneal narrow although evaluation limited by motion. Remainder of exam limited. IMPRESSION: Feeding tube tip at the expected level of the distal duodenum/ligament of Treitz. Electronically Signed   By: Lacy Duverney M.D.   On: 12/28/2015 17:41   Ct Angio Chest/abd/pel For Dissection W And/or W/wo  12/22/2015  CLINICAL DATA:  Chest pressure and epigastric pain for 1 day. Nausea and vomiting with diaphoresis. Clinical concern for  dissection. EXAM: CT ANGIOGRAPHY CHEST, ABDOMEN AND PELVIS TECHNIQUE: Multidetector CT imaging through the chest, abdomen and pelvis was performed using the standard protocol during bolus administration of intravenous contrast. Multiplanar reconstructed images and MIPs were obtained and reviewed to evaluate the vascular anatomy. CONTRAST:  100 cc Isovue 370 IV COMPARISON:  Chest radiograph earlier this day. FINDINGS: CTA CHEST FINDINGS Normal caliber thoracic aorta without aneurysm, dissection, hematoma or acute aortic abnormality. There is a conventional branching pattern from the aortic arch. No significant atherosclerosis. No filling defects in the central pulmonary arteries to suggest pulmonary embolus. Heart is normal in size. Left hilar calcification, sequela of prior granulomatous disease. No pericardial effusion. No adenopathy. Minimal dependent atelectasis in both lower lobes. 3 mm pleural based nodule in the right lower lobe series 5, image 35 is likely a calcified granuloma. 4 mm left upper lobe granuloma, series 5, image 15. No pleural effusion. Mild distal esophageal thickening. Included thyroid gland is normal. There are no acute or suspicious osseous abnormalities. Mild degenerative disc disease in the thoracic spine. Review of the MIP images confirms the above findings. CTA ABDOMEN AND PELVIS FINDINGS Normal caliber abdominal aorta without dissection or aneurysm. No significant atherosclerosis. Celiac, superior mesenteric, and inferior mesenteric arteries are widely patent. Single bilateral renal arteries are patent, early branching of the left renal artery. Bilateral common and external iliac arteries are normal. Extensive peripancreatic fat stranding extending from the head, through the body and tail. There is adjacent peripancreatic free fluid, no loculated fluid collection. Moderate fluid tracks in the right and left anterior perirenal space into the pericolic gutter. Fluid tracks in the left  upper quadrant about the medial spleen. Decreased density of the liver consistent with steatosis. Mild gallbladder distention, no calcified stone. Arterial phase  imaging of the spleen and adrenal glands is normal. Symmetric renal enhancement, no hydronephrosis. Stomach distended with ingested contents. Mild bowel wall thickening of small bowel loops in the left upper quadrant, likely reactive secondary to pancreatic inflammation. No small bowel distention. The appendix is normal. Small volume colonic stool. Minimal diverticulosis of sigmoid colon, no diverticulitis. No free air. No evidence of retroperitoneal or mesenteric adenopathy. In the pelvis the bladder is physiologically distended. Prostate gland normal in size. Fat within both inguinal canals. Scattered degenerative change throughout the lumbar spine with degenerative disc disease at L5-S1 and facet arthropathy most prominent at L3-L4. Review of the MIP images confirms the above findings. IMPRESSION: 1. Normal caliber thoracoabdominal aorta without dissection or acute aortic abnormality. 2. Marked peripancreatic inflammation consistent with pancreatitis. Peripancreatic fluid tracking in the anterior para renal space and both pericolic gutters. 3. Mild gallbladder distention, no calcified stone. Hepatic steatosis. 4. Sequela of granulomatous disease in the chest. Electronically Signed   By: Rubye Oaks M.D.   On: 12/22/2015 01:24   US Abdomen Limited Ruq  12/22/2015  CLINICAL DATA:  Initial evaluation for acute chest pain, epigastric pressure, nausea, vomiting. EXAM: US ABDOMEN LIMITED - RIGHT UPPER QUADRANT COMPARISON:  None. FINDINGS: Gallbladder: Multiple echogenic stones present within the gallbladder lumen, largest of which measured 2.4 cm. Small amount of sludge present as well. Gallbladder wall measured within normal limits at 3 mm. No free pericholecystic fluid. No sonographic Murphy sign elicited on exam. Probable focal fatty sparing noted  within the liver adjacent to the gallbladder fossa. Common bile duct: Diameter: 4 mm Liver: Diffusely increased echogenicity, suggestive of steatosis. No focal lesions. IMPRESSION: 1. Cholelithiasis without sonographic evidence for acute cholecystitis or biliary dilatation. 2. Hepatic steatosis. Electronically Signed   By: Rise Mu M.D.   On: 12/22/2015 04:04    Lab Data:  CBC:  Recent Labs Lab 12/25/15 0425 12/26/15 0442 12/27/15 0422 12/30/15 0350  WBC 12.9* 13.5* 9.7 16.3*  NEUTROABS  --  11.2*  --   --   HGB 12.4* 12.0* 11.3* 11.8*  HCT 38.2* 36.8* 35.2* 37.5*  MCV 91.4 91.5 92.9 95.7  PLT 223 252 221 323   Basic Metabolic Panel:  Recent Labs Lab 12/27/15 0422 12/28/15 0425 12/29/15 0430 12/30/15 0350 12/31/15 0430  NA 152* 153* 152* 154* 145  K 3.5 3.4* 3.3* 3.4* 3.1*  CL 119* 119* 118* 120* 110  CO2 27 28 28 29 27   GLUCOSE 237* 225* 205* 182* 294*  BUN 20 19 21* 26* 24*  CREATININE 1.19 1.19 1.19 1.30* 1.36*  CALCIUM 6.9* 7.5* 7.9* 7.7* 7.4*   GFR: Estimated Creatinine Clearance: 99.2 mL/min (by C-G formula based on Cr of 1.36). Liver Function Tests:  Recent Labs Lab 12/25/15 0425 12/26/15 0442 12/27/15 0422 12/28/15 0425  AST 27 19 17 23   ALT 47 32 24 24  ALKPHOS 85 62 57 65  BILITOT 1.3* 1.4* 1.0 0.9  PROT 5.0* 5.2* 4.9* 5.3*  ALBUMIN 2.4* 2.2* 2.0* 2.2*    Recent Labs Lab 12/25/15 0425 12/26/15 0442 12/30/15 0350  LIPASE 128* 45 16   No results for input(s): AMMONIA in the last 168 hours. Coagulation Profile: No results for input(s): INR, PROTIME in the last 168 hours. Cardiac Enzymes:  Recent Labs Lab 12/26/15 0622 12/26/15 1114 12/26/15 1709  TROPONINI <0.03 0.03 0.03   BNP (last 3 results) No results for input(s): PROBNP in the last 8760 hours. HbA1C: No results for input(s): HGBA1C in the last 72  hours. CBG:  Recent Labs Lab 12/30/15 1646 12/30/15 1948 12/30/15 2328 12/31/15 0337 12/31/15 0808  GLUCAP  318* 383* 295* 262* 230*   Lipid Profile: No results for input(s): CHOL, HDL, LDLCALC, TRIG, CHOLHDL, LDLDIRECT in the last 72 hours. Thyroid Function Tests: No results for input(s): TSH, T4TOTAL, FREET4, T3FREE, THYROIDAB in the last 72 hours. Anemia Panel: No results for input(s): VITAMINB12, FOLATE, FERRITIN, TIBC, IRON, RETICCTPCT in the last 72 hours. Urine analysis: No results found for: COLORURINE, APPEARANCEUR, LABSPEC, PHURINE, GLUCOSEU, HGBUR, BILIRUBINUR, KETONESUR, PROTEINUR, UROBILINOGEN, NITRITE, Hurshel Party M.D. [facility] 12/31/2015, 11:07 AM  Pager: [phone] Between 7am to 7pm - call Pager - [phone]  After 7pm go to [email or web address] - password [email or web address]  Call night coverage person covering after 7pm

## 2015-12-31 NOTE — Progress Notes (Addendum)
12/31/15  [id number]  Notified Abdulaziz Toman, Charge RN CVP is due now on patient. Per Charge RN she will get it in a few.  CVP's discontinued by MD.  Eliane DecreeSmith, Kim Oki Moore, RN

## 2016-01-01 DIAGNOSIS — F411 Generalized anxiety disorder: Secondary | ICD-10-CM | POA: Diagnosis not present

## 2016-01-01 LAB — BASIC METABOLIC PANEL
Anion gap: 9 (ref 5–15)
BUN: 16 mg/dL (ref 6–20)
CO2: 27 mmol/L (ref 22–32)
Calcium: 7.5 mg/dL — ABNORMAL LOW (ref 8.9–10.3)
Chloride: 105 mmol/L (ref 101–111)
Creatinine, Ser: 1.11 mg/dL (ref 0.61–1.24)
GFR calc Af Amer: >60 mL/min (ref >60)
GFR calc non Af Amer: >60 mL/min (ref >60)
Glucose, Bld: 276 mg/dL — ABNORMAL HIGH (ref 65–99)
Potassium: 3 mmol/L — ABNORMAL LOW (ref 3.5–5.1)
Sodium: 141 mmol/L (ref 135–145)

## 2016-01-01 LAB — CBC
HCT: 32.6 % — ABNORMAL LOW (ref 39.0–52.0)
Hemoglobin: 10.2 g/dL — ABNORMAL LOW (ref 13.0–17.0)
MCH: 29.4 pg (ref 26.0–34.0)
MCHC: 31.3 g/dL (ref 30.0–36.0)
MCV: 93.9 fL (ref 78.0–100.0)
Platelets: 253 10*3/uL (ref 150–400)
RBC: 3.47 MIL/uL — ABNORMAL LOW (ref 4.22–5.81)
RDW: 13.5 % (ref 11.5–15.5)
WBC: 15.2 10*3/uL — ABNORMAL HIGH (ref 4.0–10.5)

## 2016-01-01 LAB — GLUCOSE, CAPILLARY
Glucose-Capillary: 325 mg/dL — ABNORMAL HIGH (ref 65–99)
Glucose-Capillary: 345 mg/dL — ABNORMAL HIGH (ref 65–99)
Glucose-Capillary: 263 mg/dL — ABNORMAL HIGH (ref 65–99)
Glucose-Capillary: 232 mg/dL — ABNORMAL HIGH (ref 65–99)

## 2016-01-01 LAB — HEMOGLOBIN A1C
Hgb A1c MFr Bld: 6.5 % — ABNORMAL HIGH (ref 4.8–5.6)
Mean Plasma Glucose: 140 mg/dL

## 2016-01-01 MED ORDER — INSULIN GLARGINE 100 UNIT/ML ~~LOC~~ SOLN
10.0000 [IU] | Freq: Once | SUBCUTANEOUS | Status: AC
  Administered 2016-01-01: 10 [IU] via SUBCUTANEOUS
  Filled 2016-01-01: qty 0.1

## 2016-01-01 MED ORDER — POTASSIUM CHLORIDE CRYS ER 20 MEQ PO TBCR
40.0000 meq | EXTENDED_RELEASE_TABLET | Freq: Once | ORAL | Status: AC
  Administered 2016-01-01: 40 meq via ORAL
  Filled 2016-01-01: qty 2

## 2016-01-01 MED ORDER — METOPROLOL TARTRATE 50 MG PO TABS
50.0000 mg | ORAL_TABLET | Freq: Two times a day (BID) | ORAL | Status: DC
  Administered 2016-01-01: 50 mg via ORAL
  Filled 2016-01-01: qty 1

## 2016-01-01 MED ORDER — INSULIN GLARGINE 100 UNIT/ML ~~LOC~~ SOLN
30.0000 [IU] | Freq: Every day | SUBCUTANEOUS | Status: DC
  Administered 2016-01-02 – 2016-01-05 (×4): 30 [IU] via SUBCUTANEOUS
  Filled 2016-01-01 (×4): qty 0.3

## 2016-01-01 NOTE — Progress Notes (Signed)
[facility]                                                                              Patient Demographics  Timothy [name], is a 40 y.o. male, DOB - 1976-02-02, WUJ:[id number]  Admit date - 12/21/2015   Admitting Physician Michael LitterNikki Carter, MD  Outpatient Primary MD for the patient is Clayborn HeronVictoria R Rankins, MD  Outpatient specialists:   LOS - 10  days    Chief Complaint  Patient presents with  . Chest Pain  . Abdominal Pain       Brief summary   40 year old male with history of hypertension, presented to the emergency department with complaints of epigastric pain, nausea and vomiting. Patient admitted for sepsis/acute pancreatitis abnormal LFTs. Right upper quadrant ultrasound showed cholelithiasis without evidence of acute cholecystitis or biliary dilatation. CTA chest, abdomen and pelvis, showed. Pancreatic inflammation consistent with pancreatitis, per pancreatic fluid tracking in the anterior pararenal space and both pericolic gutters, mild gallbladder distention no calcified stone. Surgery was consulted for possible laparoscopic cholecystectomy. On 5/25, patient, went into acute respiratory distress, worsening tachycardia and CCM was consulted. Patient was transferred to ICU and was intubated. Patient now back to stepdown unit, [facility] assumed care on 5/31  Assessment & Plan   Acute severe pancreatitis: With shock/sepsis, acute respiratory failure complicated with ileus - Lipase improved,off NGT. -Continue full liquid diet - Repeat CT abdomen and pelvis showed active necrotizing pancreatitis. Patient is however tolerating full liquid diet. -  Consulted gastroenterology, appreciate recommendations. Per Dr Dulce Sellarutlaw, repeat CT scan tomorrow to assess for interval evolution of pancreatic necrosis. - Surgery reconsulted, recommended continue current management, follow-up outpatient for elective cholecystectomy  Acute encephalopathy with acute respiratory failure  -  Currently improved, alert and oriented 3 - Limit benzodiazepine or any sedating medications.  - Extubated, O2 sats 93% on room air  Hypernatremia -Improving, discontinue IV fluids  - Diet advanced   AKI (acute kidney injury) (HCC) with lactic acidosis, metabolic acidosis - Cr currently stable   Accelerated hypertension - Improving, on oral scheduled hydralazine, beta blocker and continue clonidine patch   Leukocytosis - Likely due to #1, blood cultures negative so far, continue imipenemFollow CBC   Hyperglycemia -Repeat hemoglobin A1c 6.5, increased Lantus to 30 units, sliding scale insulin, meal coverage    Obesity - Diet and weight control  Moderate to Severe protein calorie malnutrition - In the context of critical illness, patient has started tolerating full liquid diet  Code Status:Full CODE STATUS   DVT Prophylaxis: Lovenox   Family Communication: Discussed in detail with the patient, all imaging results, lab results explained to the patient and wife at the bedside   Disposition Plan:   Time Spent in minutes 25 minutes  Procedures:  Ultrasound abdomen CT chest/abdomen Intubation, extubation  Consultants:  Gen. Surgery CCM  Antimicrobials:  Imipenem 5/24>>   Medications  Scheduled Meds: . antiseptic oral rinse  7 mL Mouth Rinse BID  . cloNIDine  0.2 mg Transdermal Weekly  . heparin subcutaneous  5,000 Units Subcutaneous Q8H  . hydrALAZINE  25 mg Oral Q8H  . imipenem-cilastatin  500 mg Intravenous Q6H  .  insulin aspart  0-15 Units Subcutaneous TID WC  . insulin aspart  0-5 Units Subcutaneous QHS  . insulin aspart  5 Units Subcutaneous TID WC  . [START ON 01/02/2016] insulin glargine  30 Units Subcutaneous Daily  . metoprolol tartrate  25 mg Oral BID  . pantoprazole  40 mg Oral QHS  . sodium chloride flush  10-40 mL Intracatheter Q12H   Continuous Infusions:   PRN Meds:.acetaminophen, diphenhydrAMINE, hydrALAZINE, nitroGLYCERIN,  ondansetron **OR** ondansetron (ZOFRAN) IV, sodium chloride flush   Antibiotics   Anti-infectives    Start     Dose/Rate Route Frequency Ordered Stop   12/26/15 1530  erythromycin ethylsuccinate (EES) 200 MG/5ML suspension 200 mg     200 mg Per Tube  Once 12/26/15 1520 12/26/15 1646   12/24/15 1200  imipenem-cilastatin (PRIMAXIN) 500 mg in sodium chloride 0.9 % 100 mL IVPB     500 mg 200 mL/hr over 30 Minutes Intravenous Every 6 hours 12/24/15 0913     12/24/15 0930  vancomycin (VANCOCIN) IVPB 1000 mg/200 mL premix  Status:  Discontinued     1,000 mg 200 mL/hr over 60 Minutes Intravenous Every 12 hours 12/24/15 0913 12/26/15 0937   12/23/15 0800  imipenem-cilastatin (PRIMAXIN) 500 mg in sodium chloride 0.9 % 100 mL IVPB  Status:  Discontinued     500 mg 200 mL/hr over 30 Minutes Intravenous Every 8 hours 12/23/15 0705 12/24/15 0913        Subjective:   [name] was seen and examined today.Overall feeling better, tolerating full liquid diet. No fevers or chills. No nausea or vomiting. Wife at the bedside. No acute events overnight.    Objective:   Filed Vitals:   12/31/15 1627 12/31/15 1750 12/31/15 2130 01/01/16 0458  BP: 157/94  168/90 154/82  Pulse:   103 92  Temp: 98 F (36.7 C)  98.7 F (37.1 C) 100.1 F (37.8 C)  TempSrc: Oral  Oral Oral  Resp: 18  22 18   Height:  5\' 11"  (1.803 m)    Weight:  122.471 kg (270 lb)    SpO2: 95%  95% 96%    Intake/Output Summary (Last 24 hours) at 01/01/16 1417 Last data filed at 01/01/16 1351  Gross per 24 hour  Intake   1640 ml  Output    600 ml  Net   1040 ml     Wt Readings from Last 3 Encounters:  12/31/15 122.471 kg (270 lb)     Exam  General: Alert and oriented x 3, NAD  HEENT:    Neck: Supple, no JVD  Cardiovascular: S1 S2 clear, tachycardia  Respiratory: CTAB, no wheezing  Gastrointestinal: Soft,NT, ND nbs  Ext: no cyanosis clubbing or edema  Neuro: no new deficits  Skin: No rashes  Psych:  Normal affect and demeanor, alert and oriented x3    Data Reviewed:  I have personally reviewed following labs and imaging studies  Micro Results Recent Results (from the past 240 hour(s))  MRSA PCR Screening     Status: None   Collection Time: 12/22/15  4:00 PM  Result Value Ref Range Status   MRSA by PCR NEGATIVE NEGATIVE Final    Comment:        The GeneXpert MRSA Assay (FDA approved for NASAL specimens only), is one component of a comprehensive MRSA colonization surveillance program. It is not intended to diagnose MRSA infection nor to guide or monitor treatment for MRSA infections.   C difficile quick scan w PCR reflex  Status: None   Collection Time: 12/31/15  1:52 PM  Result Value Ref Range Status   C Diff antigen NEGATIVE NEGATIVE Final   C Diff toxin NEGATIVE NEGATIVE Final   C Diff interpretation Negative for toxigenic C. difficile  Final    Radiology Reports Dg Abd 1 View  12/25/2015  CLINICAL DATA:  Feeding tube placement EXAM: ABDOMEN - 1 VIEW COMPARISON:  None. FINDINGS: Feeding tube has been placed. Tip overlies the level of the descending portion of the duodenum. There is mild patient motion artifact, limiting detail. IMPRESSION: Feeding tube tip overlying the level of the descending portion the duodenum. Electronically Signed   By: Norva Pavlov M.D.   On: 12/25/2015 14:41   Ct Abdomen Pelvis W Contrast  12/31/2015  CLINICAL DATA:  History of acute pancreatitis. Hypertension and elevated liver function tests. EXAM: CT ABDOMEN AND PELVIS WITH CONTRAST TECHNIQUE: Multidetector CT imaging of the abdomen and pelvis was performed using the standard protocol following bolus administration of intravenous contrast. CONTRAST:  ISOVUE-300 IOPAMIDOL (ISOVUE-300) INJECTION 61% COMPARISON:  None. FINDINGS: Lung bases: Left greater than right lower lobe atelectasis. Small left pleural effusion. Heart normal in size. Hepatobiliary: Decreased attenuation of the liver.  Liver top-normal in size. No liver mass or focal lesion. Normal gallbladder. No bile duct dilation. Pancreas: There changes of extensive pancreatitis. There is patchy enhancement of the pancreas with relative decreased enhancement in the pancreatic tail. Pancreatic margins are ill-defined. Abnormal hypoattenuation extends from the pancreas to the periceliac and gastrohepatic ligament regions as well as the small bowel mesentery and along the right and left anterior para renal fascia. There is a small amount of ascites. There is no defined fluid collection to suggest a pseudocyst or abscess. Normal enhancement is seen in the superior mesenteric vein, the splenic vein and the portal vein. Spleen: Unremarkable. Adrenal glands, kidneys, ureters and bladder:  Unremarkable. Lymph nodes: There are several prominent to mildly enlarged lymph nodes. A posterior gastrohepatic ligament node measures 13 mm in short axis. A left periaortic retroperitoneal node measures 11 mm in short axis. Gastrointestinal: Nasogastric tube no longer visualized. Inflammatory changes surround the second and third portions of the duodenum. Stomach is unremarkable. Small bowel and colon are unremarkable. Normal appendix visualized. Musculoskeletal: Disc degenerative changes most evident at L5-S1. No osteoblastic or osteolytic lesions. IMPRESSION: 1. Acute pancreatitis. There is extensive inflammation of the pancreas with areas of relative decreased enhancement, which could reflect areas of pancreatic necrosis. Follow-up evaluation and 1224 hours is suggested. 2. No evidence of an abscess or pseudocyst. No evidence of venous thrombosis. 3. Small amount ascites. 4. Gallstones not visualized by CT, but were visualized on the recent prior ultrasound from 12/22/2015. 5. Hepatic steatosis. Electronically Signed   By: Amie Portland M.D.   On: 12/31/2015 14:25   Dg Chest Port 1 View  12/29/2015  CLINICAL DATA:  Respiratory failure. EXAM: PORTABLE CHEST 1  VIEW COMPARISON:  11/27/2015. FINDINGS: Endotracheal tube tip not definitely identified over the trachea. Clinical correlation suggested . Following discussion of this finding with the patient's nurse I was informed that the patient has been extubated. Tubing over the upper chest may be related oxygen therapy . NG tube noted below left hemidiaphragm. Left IJ line in stable position . Heart size stable. Low lung volumes with bibasilar atelectasis. Left lower lobe infiltrate cannot be excluded. Stable left upper lobe calcified nodule consistent with granuloma. No pleural effusion or pneumothorax . IMPRESSION: 1. Endotracheal tube tip not definitely identified  over the trachea. Clinical correlation suggested. Following discussion of this finding with the patient's nurse I was informed that the patient has been extubated. Tubing over the upper chest may be related to oxygen therapy. NG tube and left IJ line in stable position. 2. Low lung volumes with bibasilar atelectasis. Left lower lobe infiltrate cannot be excluded. Critical Value/emergent results were called by telephone at the time of interpretation on 12/29/2015 at 6:57 am to the patient's nurse who verbally acknowledged these results. Electronically Signed   By: Maisie Fus  Register   On: 12/29/2015 07:02   Dg Chest Port 1 View  12/27/2015  CLINICAL DATA:  Acute respiratory failure. EXAM: PORTABLE CHEST 1 VIEW COMPARISON:  Yesterday. FINDINGS: Endotracheal tube in satisfactory position. Nasogastric tube extending into the stomach. A feeding tube is also seen extending into the distal esophagus, not visualized more distally due to thickness of overlying soft tissues. Left jugular catheter tip in the proximal superior vena cava. Minimal left basilar airspace opacity with improvement and stable linear density in the right lower lung zone. The heart remains grossly normal in size. Small bilateral calcified granulomata are noted. Unremarkable bones. IMPRESSION: 1.  Minimal left basilar atelectasis or pneumonia with improvement. 2. Small amount of linear atelectasis or pleural fluid in the minor fissure in the right lower lung zone. Electronically Signed   By: Beckie Salts M.D.   On: 12/27/2015 07:50   Dg Chest Port 1 View  12/26/2015  CLINICAL DATA:  Pancreatitis EXAM: PORTABLE CHEST 1 VIEW COMPARISON:  12/24/2015 FINDINGS: Bilateral mild interstitial thickening. No pleural effusion or pneumothorax. Relatively low lung volumes. Stable cardiomediastinal silhouette. Endotracheal tube with the tip 4 cm above the carina. Nasogastric tube coursing below the diaphragm. Left IJ central venous catheter with the tip projecting over the confluence of the SVC/left brachiocephalic vein. IMPRESSION: 1. Stable support lines and tubing. 2. Mild pulmonary vascular congestion. Electronically Signed   By: Elige Ko   On: 12/26/2015 09:25   Dg Chest Port 1 View  12/24/2015  CLINICAL DATA:  Central line placement EXAM: PORTABLE CHEST 1 VIEW COMPARISON:  12/24/2015 FINDINGS: Borderline cardiomegaly. Endotracheal tube in place with tip 3.2 cm above the carina. NG tube in place. Central mild vascular congestion and mild perihilar interstitial prominence suspicious for mild interstitial edema. Left IJ central line with tip in SVC. No pneumothorax. Mild basilar atelectasis. IMPRESSION: Borderline cardiomegaly. Endotracheal tube and NG tube in place. Left IJ central line with tip in SVC. No pneumothorax. Mild basilar atelectasis. Central mild vascular congestion and mild perihilar interstitial prominence suspicious for mild interstitial edema pre Electronically Signed   By: Natasha Mead M.D.   On: 12/24/2015 10:27   Dg Chest Port 1 View  12/24/2015  CLINICAL DATA:  40 year old male with shortness of breath EXAM: PORTABLE CHEST 1 VIEW COMPARISON:  Prior chest x-ray 12/21/2015 FINDINGS: Lower inspiratory volumes with increased bibasilar opacities favored to reflect atelectasis. Low inspiratory  volumes results in pulmonary vascular crowding. Cardiac and mediastinal contours are within normal limits. Calcified granuloma in the left upper lobe, stable. No acute osseous abnormality. No large effusion, pneumothorax or edema. IMPRESSION: Lower inspiratory volumes with increased bibasilar opacities which are favored to reflect atelectasis. Electronically Signed   By: Malachy Moan M.D.   On: 12/24/2015 08:06   Dg Chest Portable 1 View  12/21/2015  CLINICAL DATA:  Substernal pain epigastric pain beginning tonight. EXAM: PORTABLE CHEST 1 VIEW COMPARISON:  None. FINDINGS: Lungs are adequately inflated without consolidation or effusion.  Cardiomediastinal silhouette is within normal. Bones and soft tissues are normal. IMPRESSION: No active disease. Electronically Signed   By: Elberta Fortis M.D.   On: 12/21/2015 23:49   Dg Abd Portable 1v  12/30/2015  CLINICAL DATA:  eval for position of NG tube - concerned it is too far in EXAM: PORTABLE ABDOMEN - 1 VIEW COMPARISON:  12/29/2015 FINDINGS: Nasal/ orogastric tube passes below the diaphragm. Tip projects in the distal stomach. There is mild gaseous distention of the visualized bowel similar to the prior study. IMPRESSION: Well-positioned nasal/orogastric tube. Electronically Signed   By: Amie Portland M.D.   On: 12/30/2015 09:07   Dg Abd Portable 1v  12/29/2015  CLINICAL DATA:  NG tube placement. EXAM: PORTABLE ABDOMEN - 1 VIEW COMPARISON:  Yesterday at 2216 hour FINDINGS: Tip and side port of the enteric tube projects over the expected location of the stomach. Mild gaseous distention of large and small bowel loops in the upper abdomen. IMPRESSION: Tip and side port of the enteric tube project over the expected location of the stomach. Electronically Signed   By: Rubye Oaks M.D.   On: 12/29/2015 04:21   Dg Abd Portable 1v  12/28/2015  CLINICAL DATA:  Nasogastric tube placement.  Initial encounter. EXAM: PORTABLE ABDOMEN - 1 VIEW COMPARISON:   Abdominal radiograph performed earlier today at 9:13 p.m. FINDINGS: The patient's enteric tube is seen ending overlying the antrum of the stomach. The visualized bowel gas pattern is grossly unremarkable, with scattered air filled loops of small bowel seen. No free intra-abdominal air is identified, though evaluation for free air is limited on a single supine view. The visualized lung bases are grossly clear. No acute osseous abnormalities are seen. IMPRESSION: Enteric tube seen ending overlying the antrum of the stomach. Electronically Signed   By: Roanna Raider M.D.   On: 12/28/2015 22:32   Dg Abd Portable 1v  12/28/2015  CLINICAL DATA:  Encounter for nasogastric tube placement. EXAM: PORTABLE ABDOMEN - 1 VIEW COMPARISON:  Earlier this day at 1647 hour FINDINGS: The previous weighted enteric tube is no longer seen. A new enteric tube is seen with its tip in the region of distal esophagus, only faintly visualized due to habitus in overlying monitoring devices. Recommend advancement. IMPRESSION: Tip of the enteric tube in the distal esophagus. Recommend advancement of at least 10 cm. Electronically Signed   By: Rubye Oaks M.D.   On: 12/28/2015 21:24   Dg Abd Portable 1v  12/28/2015  CLINICAL DATA:  40 year old male post feeding tube placement. Subsequent encounter. EXAM: PORTABLE ABDOMEN - 1 VIEW COMPARISON:  12/25/2015. FINDINGS: Feeding tube tip at the expected level of the distal duodenum/ligament of Treitz. Gas-filled prominent size transverse colon. No gross free intraperitoneal narrow although evaluation limited by motion. Remainder of exam limited. IMPRESSION: Feeding tube tip at the expected level of the distal duodenum/ligament of Treitz. Electronically Signed   By: Lacy Duverney M.D.   On: 12/28/2015 17:41   Ct Angio Chest/abd/pel For Dissection W And/or W/wo  12/22/2015  CLINICAL DATA:  Chest pressure and epigastric pain for 1 day. Nausea and vomiting with diaphoresis. Clinical concern for  dissection. EXAM: CT ANGIOGRAPHY CHEST, ABDOMEN AND PELVIS TECHNIQUE: Multidetector CT imaging through the chest, abdomen and pelvis was performed using the standard protocol during bolus administration of intravenous contrast. Multiplanar reconstructed images and MIPs were obtained and reviewed to evaluate the vascular anatomy. CONTRAST:  100 cc Isovue 370 IV COMPARISON:  Chest radiograph earlier this day. FINDINGS:  CTA CHEST FINDINGS Normal caliber thoracic aorta without aneurysm, dissection, hematoma or acute aortic abnormality. There is a conventional branching pattern from the aortic arch. No significant atherosclerosis. No filling defects in the central pulmonary arteries to suggest pulmonary embolus. Heart is normal in size. Left hilar calcification, sequela of prior granulomatous disease. No pericardial effusion. No adenopathy. Minimal dependent atelectasis in both lower lobes. 3 mm pleural based nodule in the right lower lobe series 5, image 35 is likely a calcified granuloma. 4 mm left upper lobe granuloma, series 5, image 15. No pleural effusion. Mild distal esophageal thickening. Included thyroid gland is normal. There are no acute or suspicious osseous abnormalities. Mild degenerative disc disease in the thoracic spine. Review of the MIP images confirms the above findings. CTA ABDOMEN AND PELVIS FINDINGS Normal caliber abdominal aorta without dissection or aneurysm. No significant atherosclerosis. Celiac, superior mesenteric, and inferior mesenteric arteries are widely patent. Single bilateral renal arteries are patent, early branching of the left renal artery. Bilateral common and external iliac arteries are normal. Extensive peripancreatic fat stranding extending from the head, through the body and tail. There is adjacent peripancreatic free fluid, no loculated fluid collection. Moderate fluid tracks in the right and left anterior perirenal space into the pericolic gutter. Fluid tracks in the left  upper quadrant about the medial spleen. Decreased density of the liver consistent with steatosis. Mild gallbladder distention, no calcified stone. Arterial phase imaging of the spleen and adrenal glands is normal. Symmetric renal enhancement, no hydronephrosis. Stomach distended with ingested contents. Mild bowel wall thickening of small bowel loops in the left upper quadrant, likely reactive secondary to pancreatic inflammation. No small bowel distention. The appendix is normal. Small volume colonic stool. Minimal diverticulosis of sigmoid colon, no diverticulitis. No free air. No evidence of retroperitoneal or mesenteric adenopathy. In the pelvis the bladder is physiologically distended. Prostate gland normal in size. Fat within both inguinal canals. Scattered degenerative change throughout the lumbar spine with degenerative disc disease at L5-S1 and facet arthropathy most prominent at L3-L4. Review of the MIP images confirms the above findings. IMPRESSION: 1. Normal caliber thoracoabdominal aorta without dissection or acute aortic abnormality. 2. Marked peripancreatic inflammation consistent with pancreatitis. Peripancreatic fluid tracking in the anterior para renal space and both pericolic gutters. 3. Mild gallbladder distention, no calcified stone. Hepatic steatosis. 4. Sequela of granulomatous disease in the chest. Electronically Signed   By: Rubye Oaks M.D.   On: 12/22/2015 01:24   US Abdomen Limited Ruq  12/22/2015  CLINICAL DATA:  Initial evaluation for acute chest pain, epigastric pressure, nausea, vomiting. EXAM: US ABDOMEN LIMITED - RIGHT UPPER QUADRANT COMPARISON:  None. FINDINGS: Gallbladder: Multiple echogenic stones present within the gallbladder lumen, largest of which measured 2.4 cm. Small amount of sludge present as well. Gallbladder wall measured within normal limits at 3 mm. No free pericholecystic fluid. No sonographic Murphy sign elicited on exam. Probable focal fatty sparing noted  within the liver adjacent to the gallbladder fossa. Common bile duct: Diameter: 4 mm Liver: Diffusely increased echogenicity, suggestive of steatosis. No focal lesions. IMPRESSION: 1. Cholelithiasis without sonographic evidence for acute cholecystitis or biliary dilatation. 2. Hepatic steatosis. Electronically Signed   By: Rise Mu M.D.   On: 12/22/2015 04:04    Lab Data:  CBC:  Recent Labs Lab 12/26/15 0442 12/27/15 0422 12/30/15 0350 01/01/16 0343  WBC 13.5* 9.7 16.3* 15.2*  NEUTROABS 11.2*  --   --   --   HGB 12.0* 11.3* 11.8* 10.2*  HCT 36.8* 35.2* 37.5* 32.6*  MCV 91.5 92.9 95.7 93.9  PLT 252 221 323 253   Basic Metabolic Panel:  Recent Labs Lab 12/28/15 0425 12/29/15 0430 12/30/15 0350 12/31/15 0430 01/01/16 0343  NA 153* 152* 154* 145 141  K 3.4* 3.3* 3.4* 3.1* 3.0*  CL 119* 118* 120* 110 105  CO2 GLUCOSE 225* 205* 182* 294* 276*  BUN 19 21* 26* 24* 16  CREATININE 1.19 1.19 1.30* 1.36* 1.11  CALCIUM 7.5* 7.9* 7.7* 7.4* 7.5*   GFR: Estimated Creatinine Clearance: 116.7 mL/min (by C-G formula based on Cr of 1.11). Liver Function Tests:  Recent Labs Lab 12/26/15 0442 12/27/15 0422 12/28/15 0425  AST ALT 32 24 24  ALKPHOS 62 57 65  BILITOT 1.4* 1.0 0.9  PROT 5.2* 4.9* 5.3*  ALBUMIN 2.2* 2.0* 2.2*    Recent Labs Lab 12/26/15 0442 12/30/15 0350  LIPASE 45 16   No results for input(s): AMMONIA in the last 168 hours. Coagulation Profile: No results for input(s): INR, PROTIME in the last 168 hours. Cardiac Enzymes:  Recent Labs Lab 12/26/15 0622 12/26/15 1114 12/26/15 1709  TROPONINI <0.03 0.03 0.03   BNP (last 3 results) No results for input(s): PROBNP in the last 8760 hours. HbA1C:  Recent Labs  12/31/15 1048  HGBA1C 6.5*   CBG:  Recent Labs Lab 12/31/15 1147 12/31/15 1626 12/31/15 2125 01/01/16 0811 01/01/16 1225  GLUCAP 409* 319* 296* 325* 345*   Lipid Profile: No results for  input(s): CHOL, HDL, LDLCALC, TRIG, CHOLHDL, LDLDIRECT in the last 72 hours. Thyroid Function Tests: No results for input(s): TSH, T4TOTAL, FREET4, T3FREE, THYROIDAB in the last 72 hours. Anemia Panel: No results for input(s): VITAMINB12, FOLATE, FERRITIN, TIBC, IRON, RETICCTPCT in the last 72 hours. Urine analysis: No results found for: COLORURINE, APPEARANCEUR, LABSPEC, PHURINE, GLUCOSEU, HGBUR, BILIRUBINUR, KETONESUR, PROTEINUR, UROBILINOGEN, NITRITE, Hurshel Party M.D. [facility] 01/01/2016, 2:17 PM  Pager: [phone] Between 7am to 7pm - call Pager - [phone]  After 7pm go to [email or web address] - password [email or web address]  Call night coverage person covering after 7pm

## 2016-01-01 NOTE — Progress Notes (Signed)
Physical Therapy Treatment Patient Details Name: Saleem Coccia MRN: [id number] DOB: 1976/05/24 Today's Date: 01/01/2016    History of Present Illness Pt is a 40 yo male admitted for pancreatitis, sepsis; developed respiratory distress, worsening tachycardia; transferred to ICU intubated 5/25-5/28. PMH: low back surgery in 2007.    PT Comments    Pt performed increased mobility advancing to short gait trials with close chair follow.  Will continue to progress mobility.  Pt educated to ambulate short distances with nursing over weekend to continue progress.    Follow Up Recommendations  Home health PT;Supervision/Assistance - 24 hour     Equipment Recommendations  Rolling walker with 5" wheels    Recommendations for Other Services       Precautions / Restrictions Precautions Precautions: Fall Restrictions Weight Bearing Restrictions: No    Mobility  Bed Mobility Overal bed mobility: Needs Assistance Bed Mobility: Supine to Sit     Supine to sit: Supervision;HOB elevated     General bed mobility comments: Pt performed without assist required cues for safety and line/lead management.  Transfers Overall transfer level: Needs assistance Equipment used: Rolling walker (2 wheeled) (would benefit from wide RW.  ) Transfers: Sit to/from Stand Sit to Stand: Supervision;Min guard Stand pivot transfers: Supervision;Min guard       General transfer comment: Pt unsteady but did not require assistance.  pt performed x4 trials.  Pt required cues for safety and hand placement on armrest of chair to improve ease.    Ambulation/Gait Ambulation/Gait assistance: Min guard Ambulation Distance (Feet): 8 Feet (+86ft + 14 ft.  Pt required rest break seated between sets with noticeable DOE.  SPO2 98% on RA.  ) Assistive device: Rolling walker (2 wheeled) Gait Pattern/deviations: Step-through pattern;Wide base of support;Decreased stride length;Shuffle;Trunk flexed     General Gait  Details: Cues for pacing, upper trunk control and to increase stride length.     Stairs            Wheelchair Mobility    Modified Rankin (Stroke Patients Only)       Balance Overall balance assessment: Needs assistance   Sitting balance-Leahy Scale: Fair       Standing balance-Leahy Scale: Poor                      Cognition Arousal/Alertness: Awake/alert Behavior During Therapy: WFL for tasks assessed/performed Overall Cognitive Status: Within Functional Limits for tasks assessed                      Exercises      General Comments        Pertinent Vitals/Pain Pain Assessment: No/denies pain    Home Living                      Prior Function            PT Goals (current goals can now be found in the care plan section) Acute Rehab PT Goals Patient Stated Goal: move Potential to Achieve Goals: Good Progress towards PT goals: Progressing toward goals    Frequency  Min 3X/week    PT Plan Current plan remains appropriate    Co-evaluation             End of Session Equipment Utilized During Treatment: Gait belt Activity Tolerance: No increased pain;Patient limited by fatigue Patient left: with call bell/phone within reach;in chair     Time: 1545-1610 PT Time Calculation (min) (ACUTE  ONLY): 25 min  Charges:  $Gait Training: 8-22 mins $Therapeutic Activity: 8-22 mins                    G Codes:      Florestine Aversimee J Srishti Strnad 01/01/2016, 4:23 PM Joycelyn RuaAimee Teegan Brandis, PTA pager [phone]

## 2016-01-01 NOTE — Progress Notes (Signed)
Subjective: No c/o. No n/v. +bm. Tolerating diet. Denies abd pain  Objective: Vital signs in last 24 hours: Temp:  [98 F (36.7 C)-100.1 F (37.8 C)] 100.1 F (37.8 C) (06/02 0458) Pulse Rate:  [92-103] 92 (06/02 0458) Resp:  [18-22] 18 (06/02 0458) BP: (154-168)/(82-94) 154/82 mmHg (06/02 0458) SpO2:  [95 %-96 %] 96 % (06/02 0458) Weight:  [122.471 kg (270 lb)] 122.471 kg (270 lb) (06/01 1750) Last BM Date: 01/01/16  Intake/Output from previous day: 06/01 0701 - 06/02 0700 In: 1190 [P.O.:480; I.V.:410; IV Piggyback:300] Out: 600 [Urine:600] Intake/Output this shift:    Alert, nad Sitting in chair Soft, obese, nt  Lab Results:   Recent Labs  12/30/15 0350 01/01/16 0343  WBC 16.3* 15.2*  HGB 11.8* 10.2*  HCT 37.5* 32.6*  PLT 323 253   BMET  Recent Labs  12/31/15 0430 01/01/16 0343  NA 145 141  K 3.1* 3.0*  CL 110 105  CO2 27 27  GLUCOSE 294* 276*  BUN 24* 16  CREATININE 1.36* 1.11  CALCIUM 7.4* 7.5*   PT/INR No results for input(s): LABPROT, INR in the last 72 hours. ABG No results for input(s): PHART, HCO3 in the last 72 hours.  Invalid input(s): PCO2, PO2  Studies/Results: Ct Abdomen Pelvis W Contrast  12/31/2015  CLINICAL DATA:  History of acute pancreatitis. Hypertension and elevated liver function tests. EXAM: CT ABDOMEN AND PELVIS WITH CONTRAST TECHNIQUE: Multidetector CT imaging of the abdomen and pelvis was performed using the standard protocol following bolus administration of intravenous contrast. CONTRAST:  100mL ISOVUE-300 IOPAMIDOL (ISOVUE-300) INJECTION 61% COMPARISON:  None. FINDINGS: Lung bases: Left greater than right lower lobe atelectasis. Small left pleural effusion. Heart normal in size. Hepatobiliary: Decreased attenuation of the liver. Liver top-normal in size. No liver mass or focal lesion. Normal gallbladder. No bile duct dilation. Pancreas: There changes of extensive pancreatitis. There is patchy enhancement of the pancreas  with relative decreased enhancement in the pancreatic tail. Pancreatic margins are ill-defined. Abnormal hypoattenuation extends from the pancreas to the periceliac and gastrohepatic ligament regions as well as the small bowel mesentery and along the right and left anterior para renal fascia. There is a small amount of ascites. There is no defined fluid collection to suggest a pseudocyst or abscess. Normal enhancement is seen in the superior mesenteric vein, the splenic vein and the portal vein. Spleen: Unremarkable. Adrenal glands, kidneys, ureters and bladder:  Unremarkable. Lymph nodes: There are several prominent to mildly enlarged lymph nodes. A posterior gastrohepatic ligament node measures 13 mm in short axis. A left periaortic retroperitoneal node measures 11 mm in short axis. Gastrointestinal: Nasogastric tube no longer visualized. Inflammatory changes surround the second and third portions of the duodenum. Stomach is unremarkable. Small bowel and colon are unremarkable. Normal appendix visualized. Musculoskeletal: Disc degenerative changes most evident at L5-S1. No osteoblastic or osteolytic lesions. IMPRESSION: 1. Acute pancreatitis. There is extensive inflammation of the pancreas with areas of relative decreased enhancement, which could reflect areas of pancreatic necrosis. Follow-up evaluation and 1224 hours is suggested. 2. No evidence of an abscess or pseudocyst. No evidence of venous thrombosis. 3. Small amount ascites. 4. Gallstones not visualized by CT, but were visualized on the recent prior ultrasound from 12/22/2015. 5. Hepatic steatosis. Electronically Signed   By: Amie Portlandavid  Ormond M.D.   On: 12/31/2015 14:25    Anti-infectives: Anti-infectives    Start     Dose/Rate Route Frequency Ordered Stop   12/26/15 1530  erythromycin ethylsuccinate (EES) 200 MG/5ML  suspension 200 mg     200 mg Per Tube  Once 12/26/15 1520 12/26/15 1646   12/24/15 1200  imipenem-cilastatin (PRIMAXIN) 500 mg in  sodium chloride 0.9 % 100 mL IVPB     500 mg 200 mL/hr over 30 Minutes Intravenous Every 6 hours 12/24/15 0913     12/24/15 0930  vancomycin (VANCOCIN) IVPB 1000 mg/200 mL premix  Status:  Discontinued     1,000 mg 200 mL/hr over 60 Minutes Intravenous Every 12 hours 12/24/15 0913 12/26/15 0937   12/23/15 0800  imipenem-cilastatin (PRIMAXIN) 500 mg in sodium chloride 0.9 % 100 mL IVPB  Status:  Discontinued     500 mg 200 mL/hr over 30 Minutes Intravenous Every 8 hours 12/23/15 0705 12/24/15 0913      Assessment/Plan: Gallstones Necrotizing pancreatitis Peripancreatic fluid collection  No signs of infection in pancreas on ct. Therefore no role for surgical debridement.  Hopefully peripancreatic fluid collection will resolve with time however it is concerning with time Will develop into a pancreatic pseudocyst. However there is no role for intervention on it currently. Just observation and time right now.  We discussed severe pancreatitis, gallstone management, as well as potential pancreatic pseudocyst with the patient and his wife. Otherwise he looks better than his CT imaging does. Since he is tolerating liquids without worsening pain and I don't think necessarily he needs to go to bowel rest with postpyloric feeds. However will defer to GI medicine.  Patient can follow-up with [place] as an outpatient to discuss elective cholecystectomy. Please call us back if patient decompensates clinically.  Mary Sella. Andrey Campanile, MD, FACS General, Bariatric, & Minimally Invasive Surgery [facility], [place]      LOS: 10 days    Atilano Ina 01/01/2016

## 2016-01-01 NOTE — Progress Notes (Signed)
Inpatient Diabetes Program Recommendations  AACE/ADA: New Consensus Statement on Inpatient Glycemic Control (2015)  Target Ranges:  Prepandial:   less than 140 mg/dL      Peak postprandial:   less than 180 mg/dL (1-2 hours)      Critically ill patients:  140 - 180 mg/dL   Lab Results  Component Value Date   GLUCAP 325* 01/01/2016   HGBA1C 6.5* 12/31/2015    Review of Glycemic Control  Inpatient Diabetes Program Recommendations: Insulin - Basal: Fasting glucose >300 mg/dl This am. Please consider increasing basal insulin to Lantus 26-30 units.  Thanks,  Timothy DeemShannon [name] Vanderveer RN, MSN, [facility] Inpatient Diabetes Coordinator Team Pager [phone] (8a-5p)

## 2016-01-01 NOTE — Consult Note (Signed)
[facility] Gastroenterology Consultation Note  Referring Provider: Dr. Thad Ranger [facility]) Primary Care Physician:  Clayborn Heron, MD  Reason for Consultation:  pancreatitis  HPI: Timothy [name] is a 40 y.o. male admitted two weeks ago with severe pancreatitis accompanied by SIRS requiring intubation.  Patient has slowly improved, been extubated, and is on regular hospital ward.  He had CT scan done yesterday showing possible pancreatic necrosis.  He feels weak and deconditioned.  He has no nausea or vomiting or abdominal pain (but does have some "bloating"). He has been able to tolerate full liquid diet without post-prandial abdominal pain or vomiting.  Minimal alcohol use.  Triglyceride level normal. Gallstones are noted on imaging studies.  LFTs elevated on admission but downtrending.   Past Medical History  Diagnosis Date  . Hypertension   . Pancreatitis, acute 12/22/2015  . AKI (acute kidney injury) (HCC) 12/22/2015  . Anxiety     Past Surgical History  Procedure Laterality Date  . Back surgery      Prior to Admission medications   Medication Sig Start Date End Date Taking? Authorizing Provider  lisinopril (PRINIVIL,ZESTRIL) 5 MG tablet Take 5 mg by mouth every evening.   Yes Historical Provider, MD    Current Facility-Administered Medications  Medication Dose Route Frequency Provider Last Rate Last Dose  . acetaminophen (TYLENOL) tablet 650 mg  650 mg Oral Q4H PRN Nyoka Cowden, MD      . antiseptic oral rinse (CPC / CETYLPYRIDINIUM CHLORIDE 0.05%) solution 7 mL  7 mL Mouth Rinse BID Ripudeep Jenna Luo, MD   7 mL at 01/01/16 0904  . cloNIDine (CATAPRES - Dosed in mg/24 hr) patch 0.2 mg  0.2 mg Transdermal Weekly Ripudeep K Rai, MD   0.2 mg at 12/30/15 1322  . diphenhydrAMINE (BENADRYL) capsule 25 mg  25 mg Oral QHS PRN Nyoka Cowden, MD   25 mg at 12/31/15 2222  . heparin injection 5,000 Units  5,000 Units Subcutaneous Q8H Bernadene Person, NP   5,000 Units at 01/01/16 0601   . hydrALAZINE (APRESOLINE) injection 20 mg  20 mg Intravenous Q4H PRN Michael Litter, MD   20 mg at 12/29/15 0030  . hydrALAZINE (APRESOLINE) tablet 25 mg  25 mg Oral Q8H Ripudeep K Rai, MD   25 mg at 01/01/16 0601  . imipenem-cilastatin (PRIMAXIN) 500 mg in sodium chloride 0.9 % 100 mL IVPB  500 mg Intravenous Q6H Jessica C Carney, RPH   500 mg at 01/01/16 0907  . insulin aspart (novoLOG) injection 0-15 Units  0-15 Units Subcutaneous TID WC Ripudeep Jenna Luo, MD   11 Units at 01/01/16 0903  . insulin aspart (novoLOG) injection 0-5 Units  0-5 Units Subcutaneous QHS Ripudeep Jenna Luo, MD   4 Units at 12/31/15 2225  . insulin aspart (novoLOG) injection 5 Units  5 Units Subcutaneous TID WC Ripudeep Jenna Luo, MD   5 Units at 01/01/16 0904  . insulin glargine (LANTUS) injection 10 Units  10 Units Subcutaneous Once Ripudeep K Rai, MD      . Melene Muller ON 01/02/2016] insulin glargine (LANTUS) injection 30 Units  30 Units Subcutaneous Daily Ripudeep K Rai, MD      . metoprolol tartrate (LOPRESSOR) tablet 25 mg  25 mg Oral BID Ripudeep Jenna Luo, MD   25 mg at 01/01/16 0906  . nitroGLYCERIN (NITROSTAT) SL tablet 0.4 mg  0.4 mg Sublingual Q5 min PRN Karl Ito, MD      . ondansetron [facility]) tablet 4 mg  4 mg Oral Q6H PRN Michael Litter, MD       Or  . ondansetron [facility]) injection 4 mg  4 mg Intravenous Q6H PRN Michael Litter, MD      . pantoprazole (PROTONIX) EC tablet 40 mg  40 mg Oral QHS Ripudeep Jenna Luo, MD   40 mg at 12/31/15 2222  . sodium chloride flush (NS) 0.9 % injection 10-40 mL  10-40 mL Intracatheter Q12H Nelda Bucks, MD   40 mL at 12/31/15 1000  . sodium chloride flush (NS) 0.9 % injection 10-40 mL  10-40 mL Intracatheter PRN Nelda Bucks, MD   20 mL at 01/01/16 0346    Allergies as of 12/21/2015  . (No Known Allergies)    History reviewed. No pertinent family history.  Social History   Social History  . Marital Status: Married    Spouse Name: N/A  . Number of Children: N/A  . Years of  Education: N/A   Occupational History  . Not on file.   Social History Main Topics  . Smoking status: Never Smoker   . Smokeless tobacco: Never Used  . Alcohol Use: Yes     Comment: 1 beer per night  . Drug Use: No  . Sexual Activity: Not on file   Other Topics Concern  . Not on file   Social History Narrative    Review of Systems: Positive = bold Gen: Denies any fever, chills, rigors, night sweats, anorexia, fatigue, weakness, malaise, involuntary weight loss, and sleep disorder CV: Denies chest pain, angina, palpitations, syncope, orthopnea, PND, peripheral edema, and claudication. Resp: Denies dyspnea, cough, sputum, wheezing, coughing up blood. GI: Described in detail in HPI.    GU : Denies urinary burning, blood in urine, urinary frequency, urinary hesitancy, nocturnal urination, and urinary incontinence. MS: Denies joint pain or swelling.  Denies muscle weakness, cramps, atrophy.  Derm: Denies rash, itching, oral ulcerations, hives, unhealing ulcers.  Psych: Denies depression, anxiety, memory loss, suicidal ideation, hallucinations,  and confusion. Heme: Denies bruising, bleeding, and enlarged lymph nodes. Neuro:  Denies any headaches, dizziness, paresthesias. Endo:  Denies any problems with DM, thyroid, adrenal function.  Physical Exam: Vital signs in last 24 hours: Temp:  [98 F (36.7 C)-100.1 F (37.8 C)] 100.1 F (37.8 C) (06/02 0458) Pulse Rate:  [92-103] 92 (06/02 0458) Resp:  [18-22] 18 (06/02 0458) BP: (154-179)/(82-96) 154/82 mmHg (06/02 0458) SpO2:  [94 %-97 %] 96 % (06/02 0458) Weight:  [122.471 kg (270 lb)] 122.471 kg (270 lb) (06/01 1750) Last BM Date: 12/31/15 General:   Alert,  Obese, deconditioned, NAD Head:  Normocephalic and atraumatic. Eyes:  Sclera clear, no icterus.   Conjunctiva pink. Ears:  Normal auditory acuity. Nose:  No deformity, discharge,  or lesions. Mouth:  No deformity or lesions.  Oropharynx pink & moist. Neck:  Supple; no  masses or thyromegaly. Lungs:  Clear throughout to auscultation.   No wheezes, crackles, or rhonchi. No acute distress. Heart:  Regular rate and rhythm; no murmurs, clicks, rubs,  or gallops. Abdomen:  Soft, protuberant, non-tender, hypoactive but present bowel sounds, moderate distention. No masses, hepatosplenomegaly or hernias noted. Normal bowel sounds, without guarding, and without rebound.     Msk:  Symmetrical without gross deformities. Normal posture. Pulses:  Normal pulses noted. Extremities:  Without clubbing or edema. Neurologic:  Alert and  oriented x4; diffusely weak, but grossly normal neurologically. Skin:  Bruising right arm; some bruising on abdominal wall; otherwise Intact without significant lesions or rashes.  Psych:  Alert and cooperative. Depressed mood, flat affect   Lab Results:  Recent Labs  12/30/15 0350 01/01/16 0343  WBC 16.3* 15.2*  HGB 11.8* 10.2*  HCT 37.5* 32.6*  PLT 323 253   BMET  Recent Labs  12/30/15 0350 12/31/15 0430 01/01/16 0343  NA 154* 145 141  K 3.4* 3.1* 3.0*  CL 120* 110 105  CO2 29 27 27   GLUCOSE 182* 294* 276*  BUN 26* 24* 16  CREATININE 1.30* 1.36* 1.11  CALCIUM 7.7* 7.4* 7.5*   LFT No results for input(s): PROT, ALBUMIN, AST, ALT, ALKPHOS, BILITOT, BILIDIR, IBILI in the last 72 hours. PT/INR No results for input(s): LABPROT, INR in the last 72 hours.  Studies/Results: Ct Abdomen Pelvis W Contrast  12/31/2015  CLINICAL DATA:  History of acute pancreatitis. Hypertension and elevated liver function tests. EXAM: CT ABDOMEN AND PELVIS WITH CONTRAST TECHNIQUE: Multidetector CT imaging of the abdomen and pelvis was performed using the standard protocol following bolus administration of intravenous contrast. CONTRAST:  ISOVUE-300 IOPAMIDOL (ISOVUE-300) INJECTION 61% COMPARISON:  None. FINDINGS: Lung bases: Left greater than right lower lobe atelectasis. Small left pleural effusion. Heart normal in size. Hepatobiliary:  Decreased attenuation of the liver. Liver top-normal in size. No liver mass or focal lesion. Normal gallbladder. No bile duct dilation. Pancreas: There changes of extensive pancreatitis. There is patchy enhancement of the pancreas with relative decreased enhancement in the pancreatic tail. Pancreatic margins are ill-defined. Abnormal hypoattenuation extends from the pancreas to the periceliac and gastrohepatic ligament regions as well as the small bowel mesentery and along the right and left anterior para renal fascia. There is a small amount of ascites. There is no defined fluid collection to suggest a pseudocyst or abscess. Normal enhancement is seen in the superior mesenteric vein, the splenic vein and the portal vein. Spleen: Unremarkable. Adrenal glands, kidneys, ureters and bladder:  Unremarkable. Lymph nodes: There are several prominent to mildly enlarged lymph nodes. A posterior gastrohepatic ligament node measures 13 mm in short axis. A left periaortic retroperitoneal node measures 11 mm in short axis. Gastrointestinal: Nasogastric tube no longer visualized. Inflammatory changes surround the second and third portions of the duodenum. Stomach is unremarkable. Small bowel and colon are unremarkable. Normal appendix visualized. Musculoskeletal: Disc degenerative changes most evident at L5-S1. No osteoblastic or osteolytic lesions. IMPRESSION: 1. Acute pancreatitis. There is extensive inflammation of the pancreas with areas of relative decreased enhancement, which could reflect areas of pancreatic necrosis. Follow-up evaluation and 1224 hours is suggested. 2. No evidence of an abscess or pseudocyst. No evidence of venous thrombosis. 3. Small amount ascites. 4. Gallstones not visualized by CT, but were visualized on the recent prior ultrasound from 12/22/2015. 5. Hepatic steatosis. Electronically Signed   By: Amie Portland M.D.   On: 12/31/2015 14:25   Impression:  1.  Necrotizing pancreatitis.  Patient  actually looks fairly well under the circumstances. 2.  Gallstones.  Understandably holding off on cholecystectomy given pancreatitis. 3.  Elevated LFTs, now downtrending.  Suspect gallstone pancreatitis etiology.  Minimal alcohol. Normal triglycerides.  Plan:  1.  Repeat CT scan tomorrow, at recommendation of the radiologists, to assess for any interval evolution of possible pancreatic necrosis. 2.  Full liquid diet, low fat, ok for now; he is tolerating this without postprandial pain or vomiting.  Don't see need for TPN or nasojejunal feeds, unless he develops post-prandial pain or can't eat enough to meet his daily caloric requirements. 3.  Will eventually need to transition  to oral antibiotics, will continue IV imipenem for now, pending repeat CT tomorrow. 4.  Patient's recovery from his pancreatitis (majority of which will occur after hospital discharge) will likely take weeks to months, and will be accompanied likely by stay in rehab center prior to discharge home.  I told patient that cholecystectomy will likely be deferred for several weeks-to-months.   5.  [facility] will follow.   LOS: 10 days   Oval Cavazos M  01/01/2016, 10:46 AM  Pager [phone] If no answer or after 5 PM call [phone]

## 2016-01-02 ENCOUNTER — Inpatient Hospital Stay (HOSPITAL_COMMUNITY): Payer: Federal, State, Local not specified - PPO

## 2016-01-02 LAB — CBC
HCT: 33.1 % — ABNORMAL LOW (ref 39.0–52.0)
Hemoglobin: 10.5 g/dL — ABNORMAL LOW (ref 13.0–17.0)
MCH: 29.5 pg (ref 26.0–34.0)
MCHC: 31.7 g/dL (ref 30.0–36.0)
MCV: 93 fL (ref 78.0–100.0)
Platelets: 322 10*3/uL (ref 150–400)
RBC: 3.56 MIL/uL — ABNORMAL LOW (ref 4.22–5.81)
RDW: 13.3 % (ref 11.5–15.5)
WBC: 11.5 10*3/uL — ABNORMAL HIGH (ref 4.0–10.5)

## 2016-01-02 LAB — COMPREHENSIVE METABOLIC PANEL
ALT: 404 U/L — ABNORMAL HIGH (ref 17–63)
AST: 481 U/L — ABNORMAL HIGH (ref 15–41)
Albumin: 2 g/dL — ABNORMAL LOW (ref 3.5–5.0)
Alkaline Phosphatase: 136 U/L — ABNORMAL HIGH (ref 38–126)
Anion gap: 7 (ref 5–15)
BUN: 11 mg/dL (ref 6–20)
CO2: 29 mmol/L (ref 22–32)
Calcium: 7.8 mg/dL — ABNORMAL LOW (ref 8.9–10.3)
Chloride: 105 mmol/L (ref 101–111)
Creatinine, Ser: 1.1 mg/dL (ref 0.61–1.24)
GFR calc Af Amer: >60 mL/min (ref >60)
GFR calc non Af Amer: >60 mL/min (ref >60)
Glucose, Bld: 187 mg/dL — ABNORMAL HIGH (ref 65–99)
Potassium: 3.1 mmol/L — ABNORMAL LOW (ref 3.5–5.1)
Sodium: 141 mmol/L (ref 135–145)
Total Bilirubin: 2.2 mg/dL — ABNORMAL HIGH (ref 0.3–1.2)
Total Protein: 5.5 g/dL — ABNORMAL LOW (ref 6.5–8.1)

## 2016-01-02 LAB — GLUCOSE, CAPILLARY
Glucose-Capillary: 217 mg/dL — ABNORMAL HIGH (ref 65–99)
Glucose-Capillary: 242 mg/dL — ABNORMAL HIGH (ref 65–99)
Glucose-Capillary: 290 mg/dL — ABNORMAL HIGH (ref 65–99)
Glucose-Capillary: 262 mg/dL — ABNORMAL HIGH (ref 65–99)

## 2016-01-02 MED ORDER — POTASSIUM CHLORIDE CRYS ER 20 MEQ PO TBCR
20.0000 meq | EXTENDED_RELEASE_TABLET | Freq: Once | ORAL | Status: AC
  Administered 2016-01-02: 20 meq via ORAL
  Filled 2016-01-02: qty 1

## 2016-01-02 MED ORDER — IOPAMIDOL (ISOVUE-300) INJECTION 61%
INTRAVENOUS | Status: AC
  Administered 2016-01-02: 100 mL
  Filled 2016-01-02: qty 100

## 2016-01-02 MED ORDER — CLONIDINE HCL 0.1 MG PO TABS: 0.1000 mg | ORAL_TABLET | Freq: Four times a day (QID) | ORAL | Status: DC | PRN

## 2016-01-02 MED ORDER — POTASSIUM CHLORIDE 10 MEQ/100ML IV SOLN
10.0000 meq | INTRAVENOUS | Status: AC
  Administered 2016-01-02 (×2): 10 meq via INTRAVENOUS
  Filled 2016-01-02 (×2): qty 100

## 2016-01-02 MED ORDER — SODIUM CHLORIDE 0.9 % IV SOLN
INTRAVENOUS | Status: DC
Start: 2016-01-02 — End: 2016-01-04
  Administered 2016-01-02: 19:00:00 via INTRAVENOUS

## 2016-01-02 MED ORDER — METOPROLOL TARTRATE 50 MG PO TABS
75.0000 mg | ORAL_TABLET | Freq: Two times a day (BID) | ORAL | Status: DC
  Administered 2016-01-02 – 2016-01-05 (×7): 75 mg via ORAL
  Filled 2016-01-02 (×7): qty 1

## 2016-01-02 MED ORDER — HYDRALAZINE HCL 50 MG PO TABS
50.0000 mg | ORAL_TABLET | Freq: Three times a day (TID) | ORAL | Status: DC
Start: 2016-01-02 — End: 2016-01-03
  Administered 2016-01-02 – 2016-01-03 (×3): 50 mg via ORAL
  Filled 2016-01-02 (×3): qty 1

## 2016-01-02 NOTE — Progress Notes (Addendum)
Patient ID: Timothy [name], male   DOB: 21-Jul-1976, 40 y.o.   MRN: [id number]                                                                PROGRESS NOTE                                                                                                                                                                                                             Patient Demographics:    Timothy [name], is a 40 y.o. male, DOB - 12-May-1976, WUJ:[id number]  Admit date - 12/21/2015   Admitting Physician Timothy Litter, MD  Outpatient Primary MD for the patient is Timothy Heron, MD  LOS - 11  Outpatient Specialists:  none  Chief Complaint  Patient presents with  . Chest Pain  . Abdominal Pain       Brief Narrative    40 year old male with history of hypertension, presented to the emergency department 12/22/2015 with complaints of epigastric pain, nausea and vomiting. Patient admitted for sepsis/acute pancreatitis abnormal LFTs. Right upper quadrant ultrasound showed cholelithiasis without evidence of acute cholecystitis or biliary dilatation. CTA chest, abdomen and pelvis, showed. Pancreatic inflammation consistent with pancreatitis, per pancreatic fluid tracking in the anterior pararenal space and both pericolic gutters, mild gallbladder distention no calcified stone. Surgery was consulted for possible laparoscopic cholecystectomy. On 12/24/2015 pt had acute respiratory distress with worsening tachycardia and pt transferred to ICU and intubated.  TRH resumed care on 12/30/2015.  GI consultation in place, along with surgery.       Subjective:    Timothy [name] today has been tolerating liquid diet,  bp slightly high overnite.  Denies cp, palp, sob, lower ext edema.  Pt denies abd pain, n/v, diarrhea, brbpr, black stool.     Assessment  & Plan :    Principal Problem:   Acute pancreatitis Active Problems:   Hypocalcemia   AKI (acute kidney injury) (HCC)   Accelerated hypertension  Leukocytosis   Lactic acidosis   Hyperglycemia   Obesity   Hypokalemia   Acute respiratory failure (HCC)   Pancreatitis, acute   Acute respiratory failure with hypoxia (HCC)   1. Acute severe pancreatitis with shock/sepsis, acute respiratory failure complicated by ilieus Repeat CT scan today to follow u pon nectrotizing pancreatitis.  Cont full liquid diet.  Appreciate GI input.  Per Dr. Dulce Sellar, appreciate surgery input as well,  F/u outpatient for elective cholecystectomy Cont imipenem  (defer to GI),  ? Could this be causing abnormal lft vs gallstones  2, Hypertension uncontrolled Increase metoprolol to 75mg  po bid, increase hydralazine to 50mg  po tid.  Cont clonidine patch.  Clonidine 0.1mg  po q6h prn sbp >160  3. Anemia Stable Check cbc in am tomorrow  4. Hypernatremia resolved.   5. Acute encephalopathy with acute respiratory failure  Currently axox3,  Will limit benzo and sedating medications.   6. Dm2 Cont lantus 30 units Watson qday  7. AKI Creatinine stable,  Check cmp in am tomorrow  8.  Protein calorie malnutrition  9. Hypokalemia Replete  10. ABnormal lft Pt will discuss with GI if this could be due to primaxin.    Code Status : Full Code  Disposition Plan  : home  Barriers For Discharge :  None  Consults  :  GI, Surgery, CCM  Procedures  : intubation  DVT Prophylaxis  :  Heparin K[place]   Lab Results  Component Value Date   PLT 322 01/02/2016    Antibiotics  :  imipenem  Anti-infectives    Start     Dose/Rate Route Frequency Ordered Stop   12/26/15 1530  erythromycin ethylsuccinate (EES) 200 MG/5ML suspension 200 mg     200 mg Per Tube  Once 12/26/15 1520 12/26/15 1646   12/24/15 1200  imipenem-cilastatin (PRIMAXIN) 500 mg in sodium chloride 0.9 % 100 mL IVPB     500 mg 200 mL/hr over 30 Minutes Intravenous Every 6 hours 12/24/15 0913     12/24/15 0930  vancomycin (VANCOCIN) IVPB 1000 mg/200 mL premix  Status:  Discontinued     1,000 mg 200  mL/hr over 60 Minutes Intravenous Every 12 hours 12/24/15 0913 12/26/15 0937   12/23/15 0800  imipenem-cilastatin (PRIMAXIN) 500 mg in sodium chloride 0.9 % 100 mL IVPB  Status:  Discontinued     500 mg 200 mL/hr over 30 Minutes Intravenous Every 8 hours 12/23/15 0705 12/24/15 0913        Objective:   Filed Vitals:   12/31/15 2130 01/01/16 0458 01/01/16 1404 01/01/16 2103  BP: 168/90 154/82 161/84 162/85  Pulse: 103 92 87 105  Temp: 98.7 F (37.1 C) 100.1 F (37.8 C) 97.8 F (36.6 C) 98.1 F (36.7 C)  TempSrc: Oral Oral Oral Oral  Resp: 22 18 17    Height:      Weight:      SpO2: 95% 96% 93% 93%    Wt Readings from Last 3 Encounters:  12/31/15 122.471 kg (270 lb)     Intake/Output Summary (Last 24 hours) at 01/02/16 0701 Last data filed at 01/02/16 0600  Gross per 24 hour  Intake   2320 ml  Output   1300 ml  Net   1020 ml     Physical Exam  Awake Alert, Oriented X 3, No new F.N deficits, Normal affect [place].AT,PERRAL Supple Neck,No JVD, No cervical lymphadenopathy appriciated.  Symmetrical Chest wall movement, Good air movement bilaterally, CTAB RRR,No Gallops,Rubs or new Murmurs, No Parasternal Heave +ve B.Sounds, Abd Soft, No tenderness, No organomegaly appriciated, No rebound - guarding or rigidity. No Cyanosis, Clubbing or edema, No new Rash or bruise  Negative cullens sign Morbidly obese   Data Review:    CBC  Recent Labs Lab 12/27/15 0422 12/30/15 0350 01/01/16 0343 01/02/16 0431  WBC 9.7 16.3*  15.2* 11.5*  HGB 11.3* 11.8* 10.2* 10.5*  HCT 35.2* 37.5* 32.6* 33.1*  PLT 221 323 253 322  MCV 92.9 95.7 93.9 93.0  MCH 29.8 30.1 29.4 29.5  MCHC 32.1 31.5 31.3 31.7  RDW 14.7 14.9 13.5 13.3    Chemistries   Recent Labs Lab 12/27/15 0422 12/28/15 0425 12/29/15 0430 12/30/15 0350 12/31/15 0430 01/01/16 0343 01/02/16 0431  NA 152* 153* 152* 154* 145 141 141  K 3.5 3.4* 3.3* 3.4* 3.1* 3.0* 3.1*  CL 119* 119* 118* 120* 110 105 105  CO2 27 28  28 29 27 27 29   GLUCOSE 237* 225* 205* 182* 294* 276* 187*  BUN 20 19 21* 26* 24* 16 11  CREATININE 1.19 1.19 1.19 1.30* 1.36* 1.11 1.10  CALCIUM 6.9* 7.5* 7.9* 7.7* 7.4* 7.5* 7.8*  AST 17 23  --   --   --   --  481*  ALT 24 24  --   --   --   --  404*  ALKPHOS 57 65  --   --   --   --  136*  BILITOT 1.0 0.9  --   --   --   --  2.2*   ------------------------------------------------------------------------------------------------------------------ No results for input(s): CHOL, HDL, LDLCALC, TRIG, CHOLHDL, LDLDIRECT in the last 72 hours.  Lab Results  Component Value Date   HGBA1C 6.5* 12/31/2015   ------------------------------------------------------------------------------------------------------------------ No results for input(s): TSH, T4TOTAL, T3FREE, THYROIDAB in the last 72 hours.  Invalid input(s): FREET3 ------------------------------------------------------------------------------------------------------------------ No results for input(s): VITAMINB12, FOLATE, FERRITIN, TIBC, IRON, RETICCTPCT in the last 72 hours.  Coagulation profile No results for input(s): INR, PROTIME in the last 168 hours.  No results for input(s): DDIMER in the last 72 hours.  Cardiac Enzymes  Recent Labs Lab 12/26/15 1114 12/26/15 1709  TROPONINI 0.03 0.03   ------------------------------------------------------------------------------------------------------------------    Component Value Date/Time   BNP 151.9* 12/24/2015 0757    Inpatient Medications  Scheduled Meds: . antiseptic oral rinse  7 mL Mouth Rinse BID  . cloNIDine  0.2 mg Transdermal Weekly  . heparin subcutaneous  5,000 Units Subcutaneous Q8H  . hydrALAZINE  50 mg Oral Q8H  . imipenem-cilastatin  500 mg Intravenous Q6H  . insulin aspart  0-15 Units Subcutaneous TID WC  . insulin aspart  0-5 Units Subcutaneous QHS  . insulin aspart  5 Units Subcutaneous TID WC  . insulin glargine  30 Units Subcutaneous Daily  .  metoprolol tartrate  75 mg Oral BID  . pantoprazole  40 mg Oral QHS  . potassium chloride  10 mEq Intravenous Q1 Hr x 2  . sodium chloride flush  10-40 mL Intracatheter Q12H   Continuous Infusions:  PRN Meds:.acetaminophen, cloNIDine, diphenhydrAMINE, hydrALAZINE, nitroGLYCERIN, ondansetron **OR** ondansetron (ZOFRAN) IV, sodium chloride flush  Micro Results Recent Results (from the past 240 hour(s))  C difficile quick scan w PCR reflex     Status: None   Collection Time: 12/31/15  1:52 PM  Result Value Ref Range Status   C Diff antigen NEGATIVE NEGATIVE Final   C Diff toxin NEGATIVE NEGATIVE Final   C Diff interpretation Negative for toxigenic C. difficile  Final    Radiology Reports Dg Abd 1 View  12/25/2015  CLINICAL DATA:  Feeding tube placement EXAM: ABDOMEN - 1 VIEW COMPARISON:  None. FINDINGS: Feeding tube has been placed. Tip overlies the level of the descending portion of the duodenum. There is mild patient motion artifact, limiting detail. IMPRESSION: Feeding tube tip overlying the  level of the descending portion the duodenum. Electronically Signed   By: Norva PavlovElizabeth  Brown M.D.   On: 12/25/2015 14:41   Ct Abdomen Pelvis W Contrast  12/31/2015  CLINICAL DATA:  History of acute pancreatitis. Hypertension and elevated liver function tests. EXAM: CT ABDOMEN AND PELVIS WITH CONTRAST TECHNIQUE: Multidetector CT imaging of the abdomen and pelvis was performed using the standard protocol following bolus administration of intravenous contrast. CONTRAST:  100mL ISOVUE-300 IOPAMIDOL (ISOVUE-300) INJECTION 61% COMPARISON:  None. FINDINGS: Lung bases: Left greater than right lower lobe atelectasis. Small left pleural effusion. Heart normal in size. Hepatobiliary: Decreased attenuation of the liver. Liver top-normal in size. No liver mass or focal lesion. Normal gallbladder. No bile duct dilation. Pancreas: There changes of extensive pancreatitis. There is patchy enhancement of the pancreas with  relative decreased enhancement in the pancreatic tail. Pancreatic margins are ill-defined. Abnormal hypoattenuation extends from the pancreas to the periceliac and gastrohepatic ligament regions as well as the small bowel mesentery and along the right and left anterior para renal fascia. There is a small amount of ascites. There is no defined fluid collection to suggest a pseudocyst or abscess. Normal enhancement is seen in the superior mesenteric vein, the splenic vein and the portal vein. Spleen: Unremarkable. Adrenal glands, kidneys, ureters and bladder:  Unremarkable. Lymph nodes: There are several prominent to mildly enlarged lymph nodes. A posterior gastrohepatic ligament node measures 13 mm in short axis. A left periaortic retroperitoneal node measures 11 mm in short axis. Gastrointestinal: Nasogastric tube no longer visualized. Inflammatory changes surround the second and third portions of the duodenum. Stomach is unremarkable. Small bowel and colon are unremarkable. Normal appendix visualized. Musculoskeletal: Disc degenerative changes most evident at L5-S1. No osteoblastic or osteolytic lesions. IMPRESSION: 1. Acute pancreatitis. There is extensive inflammation of the pancreas with areas of relative decreased enhancement, which could reflect areas of pancreatic necrosis. Follow-up evaluation and 1224 hours is suggested. 2. No evidence of an abscess or pseudocyst. No evidence of venous thrombosis. 3. Small amount ascites. 4. Gallstones not visualized by CT, but were visualized on the recent prior ultrasound from 12/22/2015. 5. Hepatic steatosis. Electronically Signed   By: Amie Portlandavid  Ormond M.D.   On: 12/31/2015 14:25   Dg Chest Port 1 View  12/29/2015  CLINICAL DATA:  Respiratory failure. EXAM: PORTABLE CHEST 1 VIEW COMPARISON:  11/27/2015. FINDINGS: Endotracheal tube tip not definitely identified over the trachea. Clinical correlation suggested . Following discussion of this finding with the patient's  nurse I was informed that the patient has been extubated. Tubing over the upper chest may be related oxygen therapy . NG tube noted below left hemidiaphragm. Left IJ line in stable position . Heart size stable. Low lung volumes with bibasilar atelectasis. Left lower lobe infiltrate cannot be excluded. Stable left upper lobe calcified nodule consistent with granuloma. No pleural effusion or pneumothorax . IMPRESSION: 1. Endotracheal tube tip not definitely identified over the trachea. Clinical correlation suggested. Following discussion of this finding with the patient's nurse I was informed that the patient has been extubated. Tubing over the upper chest may be related to oxygen therapy. NG tube and left IJ line in stable position. 2. Low lung volumes with bibasilar atelectasis. Left lower lobe infiltrate cannot be excluded. Critical Value/emergent results were called by telephone at the time of interpretation on 12/29/2015 at 6:57 am to the patient's nurse who verbally acknowledged these results. Electronically Signed   By: Maisie Fushomas  Register   On: 12/29/2015 07:02   Dg Chest  Port 1 View  12/27/2015  CLINICAL DATA:  Acute respiratory failure. EXAM: PORTABLE CHEST 1 VIEW COMPARISON:  Yesterday. FINDINGS: Endotracheal tube in satisfactory position. Nasogastric tube extending into the stomach. A feeding tube is also seen extending into the distal esophagus, not visualized more distally due to thickness of overlying soft tissues. Left jugular catheter tip in the proximal superior vena cava. Minimal left basilar airspace opacity with improvement and stable linear density in the right lower lung zone. The heart remains grossly normal in size. Small bilateral calcified granulomata are noted. Unremarkable bones. IMPRESSION: 1. Minimal left basilar atelectasis or pneumonia with improvement. 2. Small amount of linear atelectasis or pleural fluid in the minor fissure in the right lower lung zone. Electronically Signed   By:  Beckie Salts M.D.   On: 12/27/2015 07:50   Dg Chest Port 1 View  12/26/2015  CLINICAL DATA:  Pancreatitis EXAM: PORTABLE CHEST 1 VIEW COMPARISON:  12/24/2015 FINDINGS: Bilateral mild interstitial thickening. No pleural effusion or pneumothorax. Relatively low lung volumes. Stable cardiomediastinal silhouette. Endotracheal tube with the tip 4 cm above the carina. Nasogastric tube coursing below the diaphragm. Left IJ central venous catheter with the tip projecting over the confluence of the SVC/left brachiocephalic vein. IMPRESSION: 1. Stable support lines and tubing. 2. Mild pulmonary vascular congestion. Electronically Signed   By: Elige Ko   On: 12/26/2015 09:25   Dg Chest Port 1 View  12/24/2015  CLINICAL DATA:  Central line placement EXAM: PORTABLE CHEST 1 VIEW COMPARISON:  12/24/2015 FINDINGS: Borderline cardiomegaly. Endotracheal tube in place with tip 3.2 cm above the carina. NG tube in place. Central mild vascular congestion and mild perihilar interstitial prominence suspicious for mild interstitial edema. Left IJ central line with tip in SVC. No pneumothorax. Mild basilar atelectasis. IMPRESSION: Borderline cardiomegaly. Endotracheal tube and NG tube in place. Left IJ central line with tip in SVC. No pneumothorax. Mild basilar atelectasis. Central mild vascular congestion and mild perihilar interstitial prominence suspicious for mild interstitial edema pre Electronically Signed   By: Natasha Mead M.D.   On: 12/24/2015 10:27   Dg Chest Port 1 View  12/24/2015  CLINICAL DATA:  40 year old male with shortness of breath EXAM: PORTABLE CHEST 1 VIEW COMPARISON:  Prior chest x-ray 12/21/2015 FINDINGS: Lower inspiratory volumes with increased bibasilar opacities favored to reflect atelectasis. Low inspiratory volumes results in pulmonary vascular crowding. Cardiac and mediastinal contours are within normal limits. Calcified granuloma in the left upper lobe, stable. No acute osseous abnormality. No large  effusion, pneumothorax or edema. IMPRESSION: Lower inspiratory volumes with increased bibasilar opacities which are favored to reflect atelectasis. Electronically Signed   By: Malachy Moan M.D.   On: 12/24/2015 08:06   Dg Chest Portable 1 View  12/21/2015  CLINICAL DATA:  Substernal pain epigastric pain beginning tonight. EXAM: PORTABLE CHEST 1 VIEW COMPARISON:  None. FINDINGS: Lungs are adequately inflated without consolidation or effusion. Cardiomediastinal silhouette is within normal. Bones and soft tissues are normal. IMPRESSION: No active disease. Electronically Signed   By: Elberta Fortis M.D.   On: 12/21/2015 23:49   Dg Abd Portable 1v  12/30/2015  CLINICAL DATA:  eval for position of NG tube - concerned it is too far in EXAM: PORTABLE ABDOMEN - 1 VIEW COMPARISON:  12/29/2015 FINDINGS: Nasal/ orogastric tube passes below the diaphragm. Tip projects in the distal stomach. There is mild gaseous distention of the visualized bowel similar to the prior study. IMPRESSION: Well-positioned nasal/orogastric tube. Electronically Signed   By: Onalee Hua  Ormond M.D.   On: 12/30/2015 09:07   Dg Abd Portable 1v  12/29/2015  CLINICAL DATA:  NG tube placement. EXAM: PORTABLE ABDOMEN - 1 VIEW COMPARISON:  Yesterday at 2216 hour FINDINGS: Tip and side port of the enteric tube projects over the expected location of the stomach. Mild gaseous distention of large and small bowel loops in the upper abdomen. IMPRESSION: Tip and side port of the enteric tube project over the expected location of the stomach. Electronically Signed   By: Rubye Oaks M.D.   On: 12/29/2015 04:21   Dg Abd Portable 1v  12/28/2015  CLINICAL DATA:  Nasogastric tube placement.  Initial encounter. EXAM: PORTABLE ABDOMEN - 1 VIEW COMPARISON:  Abdominal radiograph performed earlier today at 9:13 p.m. FINDINGS: The patient's enteric tube is seen ending overlying the antrum of the stomach. The visualized bowel gas pattern is grossly unremarkable,  with scattered air filled loops of small bowel seen. No free intra-abdominal air is identified, though evaluation for free air is limited on a single supine view. The visualized lung bases are grossly clear. No acute osseous abnormalities are seen. IMPRESSION: Enteric tube seen ending overlying the antrum of the stomach. Electronically Signed   By: Roanna Raider M.D.   On: 12/28/2015 22:32   Dg Abd Portable 1v  12/28/2015  CLINICAL DATA:  Encounter for nasogastric tube placement. EXAM: PORTABLE ABDOMEN - 1 VIEW COMPARISON:  Earlier this day at 1647 hour FINDINGS: The previous weighted enteric tube is no longer seen. A new enteric tube is seen with its tip in the region of distal esophagus, only faintly visualized due to habitus in overlying monitoring devices. Recommend advancement. IMPRESSION: Tip of the enteric tube in the distal esophagus. Recommend advancement of at least 10 cm. Electronically Signed   By: Rubye Oaks M.D.   On: 12/28/2015 21:24   Dg Abd Portable 1v  12/28/2015  CLINICAL DATA:  40 year old male post feeding tube placement. Subsequent encounter. EXAM: PORTABLE ABDOMEN - 1 VIEW COMPARISON:  12/25/2015. FINDINGS: Feeding tube tip at the expected level of the distal duodenum/ligament of [place]. Gas-filled prominent size transverse colon. No gross free intraperitoneal narrow although evaluation limited by motion. Remainder of exam limited. IMPRESSION: Feeding tube tip at the expected level of the distal duodenum/ligament of [place]. Electronically Signed   By: Lacy Duverney M.D.   On: 12/28/2015 17:41   Ct Angio Chest/abd/pel For Dissection W And/or W/wo  12/22/2015  CLINICAL DATA:  Chest pressure and epigastric pain for 1 day. Nausea and vomiting with diaphoresis. Clinical concern for dissection. EXAM: CT ANGIOGRAPHY CHEST, ABDOMEN AND PELVIS TECHNIQUE: Multidetector CT imaging through the chest, abdomen and pelvis was performed using the standard protocol during bolus administration  of intravenous contrast. Multiplanar reconstructed images and MIPs were obtained and reviewed to evaluate the vascular anatomy. CONTRAST:  100 cc Isovue 370 IV COMPARISON:  Chest radiograph earlier this day. FINDINGS: CTA CHEST FINDINGS Normal caliber thoracic aorta without aneurysm, dissection, hematoma or acute aortic abnormality. There is a conventional branching pattern from the aortic arch. No significant atherosclerosis. No filling defects in the central pulmonary arteries to suggest pulmonary embolus. Heart is normal in size. Left hilar calcification, sequela of prior granulomatous disease. No pericardial effusion. No adenopathy. Minimal dependent atelectasis in both lower lobes. 3 mm pleural based nodule in the right lower lobe series 5, image 35 is likely a calcified granuloma. 4 mm left upper lobe granuloma, series 5, image 15. No pleural effusion. Mild distal esophageal thickening. Included  thyroid gland is normal. There are no acute or suspicious osseous abnormalities. Mild degenerative disc disease in the thoracic spine. Review of the MIP images confirms the above findings. CTA ABDOMEN AND PELVIS FINDINGS Normal caliber abdominal aorta without dissection or aneurysm. No significant atherosclerosis. Celiac, superior mesenteric, and inferior mesenteric arteries are widely patent. Single bilateral renal arteries are patent, early branching of the left renal artery. Bilateral common and external iliac arteries are normal. Extensive peripancreatic fat stranding extending from the head, through the body and tail. There is adjacent peripancreatic free fluid, no loculated fluid collection. Moderate fluid tracks in the right and left anterior perirenal space into the pericolic gutter. Fluid tracks in the left upper quadrant about the medial spleen. Decreased density of the liver consistent with steatosis. Mild gallbladder distention, no calcified stone. Arterial phase imaging of the spleen and adrenal glands is  normal. Symmetric renal enhancement, no hydronephrosis. Stomach distended with ingested contents. Mild bowel wall thickening of small bowel loops in the left upper quadrant, likely reactive secondary to pancreatic inflammation. No small bowel distention. The appendix is normal. Small volume colonic stool. Minimal diverticulosis of sigmoid colon, no diverticulitis. No free air. No evidence of retroperitoneal or mesenteric adenopathy. In the pelvis the bladder is physiologically distended. Prostate gland normal in size. Fat within both inguinal canals. Scattered degenerative change throughout the lumbar spine with degenerative disc disease at L5-S1 and facet arthropathy most prominent at L3-L4. Review of the MIP images confirms the above findings. IMPRESSION: 1. Normal caliber thoracoabdominal aorta without dissection or acute aortic abnormality. 2. Marked peripancreatic inflammation consistent with pancreatitis. Peripancreatic fluid tracking in the anterior para renal space and both pericolic gutters. 3. Mild gallbladder distention, no calcified stone. Hepatic steatosis. 4. Sequela of granulomatous disease in the chest. Electronically Signed   By: Rubye Oaks M.D.   On: 12/22/2015 01:24   US Abdomen Limited Ruq  12/22/2015  CLINICAL DATA:  Initial evaluation for acute chest pain, epigastric pressure, nausea, vomiting. EXAM: US ABDOMEN LIMITED - RIGHT UPPER QUADRANT COMPARISON:  None. FINDINGS: Gallbladder: Multiple echogenic stones present within the gallbladder lumen, largest of which measured 2.4 cm. Small amount of sludge present as well. Gallbladder wall measured within normal limits at 3 mm. No free pericholecystic fluid. No sonographic Murphy sign elicited on exam. Probable focal fatty sparing noted within the liver adjacent to the gallbladder fossa. Common bile duct: Diameter: 4 mm Liver: Diffusely increased echogenicity, suggestive of steatosis. No focal lesions. IMPRESSION: 1. Cholelithiasis without  sonographic evidence for acute cholecystitis or biliary dilatation. 2. Hepatic steatosis. Electronically Signed   By: Rise Mu M.D.   On: 12/22/2015 04:04    Time Spent in minutes  30   Pearson Grippe M.D on 01/02/2016 at 7:01 AM  Between 7am to 7pm - Pager - [phone]  After 7pm go to [email or web address] - password [facility]  [facility] -  Office  [phone]

## 2016-01-02 NOTE — Progress Notes (Signed)
Patient ID: Timothy [name], male   DOB: Jan 18, 1976, 40 y.o.   MRN: [id number] [facility]  Timothy CoyQuinten Romig 40 y.o. Jan 18, 1976   Subjective: Feels ok. Denies abdominal pain. Tolerating clear liquids. Wife at bedside.  Objective: Vital signs in last 24 hours: Filed Vitals:   01/01/16 2103 01/02/16 1302  BP: 162/85 144/73  Pulse: 105 68  Temp: 98.1 F (36.7 C) 98.5 F (36.9 C)  Resp:  19    Physical Exam: Gen: lethargic, obese, no acute distress HEENT: anicteric sclera CV: RRR Chest: CTA B Abd: RUQ and epigastric tenderness with minimal guarding, soft, nondistended, +BS Ext: no edema  Lab Results:  Recent Labs  01/01/16 0343 01/02/16 0431  NA 141 141  K 3.0* 3.1*  CL 105 105  CO2 27 29  GLUCOSE 276* 187*  BUN 16 11  CREATININE 1.11 1.10  CALCIUM 7.5* 7.8*    Recent Labs  01/02/16 0431  AST 481*  ALT 404*  ALKPHOS 136*  BILITOT 2.2*  PROT 5.5*  ALBUMIN 2.0*    Recent Labs  01/01/16 0343 01/02/16 0431  WBC 15.2* 11.5*  HGB 10.2* 10.5*  HCT 32.6* 33.1*  MCV 93.9 93.0  PLT 253 322   No results for input(s): LABPROT, INR in the last 72 hours.  IMPRESSION: 1. Severe pancreatitis with surrounding inflammation and fluid but no definitive walled off fluid collections/pseudocysts. There is poor enhancement throughout much of the pancreas, particularly in the tail, suggesting areas of pancreatic necrosis. There is a least some enhancement remaining in the pancreatic head, neck, and proximal body. No venous thrombosis seen at this time. 2. By report, the patient had stones in the gallbladder on recent ultrasound which are not visualized on this study.    Assessment/Plan: Severe acute pancreatitis with persistent necrosis on CT today and per radiology more edema near the pancreatic head. Elevated LFTs may be due to antibiotics and debatable about whether to use IV Abx in patients with necrosis. If patient has infected necrosis  then antibiotics are indicated but in sterile necrosis they are not recommended. The type of necrosis he has is not known. He is showing no signs of sepsis but due to the persistent necrosis and leukocytosis would continue the IV Imipenem for now. However, his LFTs have risen markedly today, which may be due to the Abx. If his LFTs continue to rise tomorrow, then would d/c the Abx. Clinically he is doing ok and stable despite the pancreatic necrosis seen. Denies any worsened pain with drinking liquids. If he can obtain adequate caloric intake orally without worsened abdominal pain then nasojejunal or nasogastric feeds not needed. On full liquids now and would advance to low fat soft diet as tolerated. Will follow.   Jaze Rodino C. 01/02/2016, 6:30 PM  Pager [phone]  If no answer or after 5 PM call [phone]

## 2016-01-02 NOTE — Progress Notes (Signed)
Inpatient Diabetes Program Recommendations  AACE/ADA: New Consensus Statement on Inpatient Glycemic Control (2015)  Target Ranges:  Prepandial:   less than 140 mg/dL      Peak postprandial:   less than 180 mg/dL (1-2 hours)      Critically ill patients:  140 - 180 mg/dL   Results for Timothy [name], Timothy [name] (MRN [id number]) as of 01/02/2016 15:25  Ref. Range 01/01/2016 08:11 01/01/2016 12:25 01/01/2016 17:37 01/01/2016 22:19  Glucose-Capillary Latest Ref Range: 65-99 mg/dL 409325 (H) 811345 (H) 914263 (H) 232 (H)   Results for Timothy [name], Timothy [name] (MRN [id number]) as of 01/02/2016 15:25  Ref. Range 01/02/2016 07:34 01/02/2016 11:39  Glucose-Capillary Latest Ref Range: 65-99 mg/dL 086217 (H) 578242 (H)    Current Insulin Orders: Lantus 30 units daily      Novolog Moderate Correction Scale/ SSI (0-15 units) TID AC + HS      Novolog 5 units tidwc      MD- Patient required a total of 47 units of Novolog yesterday 06/02 (SSI + Meal Coverage)  Please consider the following in-hospital insulin adjustments:  1. Increase Lantus further to 35 units daily (0.3 units/kg dosing)  2. Increase Novolog Meal Coverage to 7 units tidwc      --Will follow patient during hospitalization--  Ambrose FinlandJeannine Johnston Rogen Porte RN, MSN, CDE Diabetes Coordinator Inpatient Glycemic Control Team Team Pager: [phone] (8a-5p)

## 2016-01-03 LAB — COMPREHENSIVE METABOLIC PANEL
ALT: 182 U/L — ABNORMAL HIGH (ref 17–63)
AST: 64 U/L — ABNORMAL HIGH (ref 15–41)
Albumin: 1.8 g/dL — ABNORMAL LOW (ref 3.5–5.0)
Alkaline Phosphatase: 102 U/L (ref 38–126)
Anion gap: 6 (ref 5–15)
BUN: 9 mg/dL (ref 6–20)
CO2: 26 mmol/L (ref 22–32)
Calcium: 7.4 mg/dL — ABNORMAL LOW (ref 8.9–10.3)
Chloride: 107 mmol/L (ref 101–111)
Creatinine, Ser: 1.06 mg/dL (ref 0.61–1.24)
GFR calc Af Amer: >60 mL/min (ref >60)
GFR calc non Af Amer: >60 mL/min (ref >60)
Glucose, Bld: 186 mg/dL — ABNORMAL HIGH (ref 65–99)
Potassium: 2.8 mmol/L — ABNORMAL LOW (ref 3.5–5.1)
Sodium: 139 mmol/L (ref 135–145)
Total Bilirubin: 0.9 mg/dL (ref 0.3–1.2)
Total Protein: 5 g/dL — ABNORMAL LOW (ref 6.5–8.1)

## 2016-01-03 LAB — CBC WITH DIFFERENTIAL/PLATELET
Basophils Absolute: 0 10*3/uL (ref 0.0–0.1)
Basophils Relative: 0 %
Eosinophils Absolute: 0.2 10*3/uL (ref 0.0–0.7)
Eosinophils Relative: 1 %
HCT: 30.8 % — ABNORMAL LOW (ref 39.0–52.0)
Hemoglobin: 9.7 g/dL — ABNORMAL LOW (ref 13.0–17.0)
Lymphocytes Relative: 9 %
Lymphs Abs: 1 10*3/uL (ref 0.7–4.0)
MCH: 29 pg (ref 26.0–34.0)
MCHC: 31.5 g/dL (ref 30.0–36.0)
MCV: 91.9 fL (ref 78.0–100.0)
Monocytes Absolute: 0.8 10*3/uL (ref 0.1–1.0)
Monocytes Relative: 7 %
Neutro Abs: 8.9 10*3/uL — ABNORMAL HIGH (ref 1.7–7.7)
Neutrophils Relative %: 83 %
Platelets: 325 10*3/uL (ref 150–400)
RBC: 3.35 MIL/uL — ABNORMAL LOW (ref 4.22–5.81)
RDW: 13.4 % (ref 11.5–15.5)
WBC: 10.9 10*3/uL — ABNORMAL HIGH (ref 4.0–10.5)

## 2016-01-03 LAB — GLUCOSE, CAPILLARY
Glucose-Capillary: 187 mg/dL — ABNORMAL HIGH (ref 65–99)
Glucose-Capillary: 242 mg/dL — ABNORMAL HIGH (ref 65–99)
Glucose-Capillary: 169 mg/dL — ABNORMAL HIGH (ref 65–99)
Glucose-Capillary: 215 mg/dL — ABNORMAL HIGH (ref 65–99)

## 2016-01-03 LAB — LIPASE, BLOOD: Lipase: 17 U/L (ref 11–51)

## 2016-01-03 LAB — MAGNESIUM: Magnesium: 1.9 mg/dL (ref 1.7–2.4)

## 2016-01-03 MED ORDER — POTASSIUM CHLORIDE 10 MEQ/50ML IV SOLN
10.0000 meq | INTRAVENOUS | Status: AC
  Administered 2016-01-03 (×3): 10 meq via INTRAVENOUS
  Filled 2016-01-03 (×3): qty 50

## 2016-01-03 MED ORDER — POTASSIUM CHLORIDE 10 MEQ/50ML IV SOLN
10.0000 meq | INTRAVENOUS | Status: DC
  Filled 2016-01-03 (×3): qty 50

## 2016-01-03 MED ORDER — HYDRALAZINE HCL 50 MG PO TABS
50.0000 mg | ORAL_TABLET | Freq: Four times a day (QID) | ORAL | Status: DC
  Administered 2016-01-03 – 2016-01-05 (×9): 50 mg via ORAL
  Filled 2016-01-03 (×9): qty 1

## 2016-01-03 NOTE — Progress Notes (Signed)
Patient ID: Marcella Dunnaway, male   DOB: 28-Aug-1975, 40 y.o.   MRN: [id number]                                                                PROGRESS NOTE                                                                                                                                                                                                             Patient Demographics:    Lawrance Wiedemann, is a 40 y.o. male, DOB - 01-16-1976, QMV:[id number]  Admit date - 12/21/2015   Admitting Physician Michael Litter, MD  Outpatient Primary MD for the patient is Clayborn Heron, MD  LOS - 12  Outpatient Specialists:  Chief Complaint  Patient presents with  . Chest Pain  . Abdominal Pain       Brief Narrative   40 year old male with history of hypertension, presented to the emergency department 12/22/2015 with complaints of epigastric pain, nausea and vomiting. Patient admitted for sepsis/acute pancreatitis abnormal LFTs. Right upper quadrant ultrasound showed cholelithiasis without evidence of acute cholecystitis or biliary dilatation. CTA chest, abdomen and pelvis, showed. Pancreatic inflammation consistent with pancreatitis, per pancreatic fluid tracking in the anterior pararenal space and both pericolic gutters, mild gallbladder distention no calcified stone. Surgery was consulted for possible laparoscopic cholecystectomy. On 12/24/2015 pt had acute respiratory distress with worsening tachycardia and pt transferred to ICU and intubated. TRH resumed care on 12/30/2015. GI consultation in place, along with surgery.    Subjective:    [name] today denies cp, palp, sob, abd pain, diarrhea, brbpr, black stool.  Doing better.  CT scan reveals severe pancreatitis, fatty liver, awaiting lipase this am.     Assessment  & Plan :    Principal Problem:   Acute pancreatitis Active Problems:   Hypocalcemia   AKI (acute kidney injury) (HCC)   Accelerated hypertension   Leukocytosis  Lactic acidosis   Hyperglycemia   Obesity   Hypokalemia   Acute respiratory failure (HCC)   Pancreatitis, acute   Acute respiratory failure with hypoxia (HCC)   1. Acute severe pancreatitis with shock/sepsis, acute respiratory failure complicated by ilieus Repeat CT scan 6/3 => severe pancreatitis,  No pseudocyst. Cont full liquid diet. advance to low fat diet as tolerated.  Appreciate GI input.Appreciate surgery input as well, F/u outpatient for elective cholecystectomy Cont imipenem, LFT improving.    2. ABnormal lft improving,  Check cmp in am,  Awaiting hepatitis panel  3. Hypertension uncontrolled Increase metoprolol to 75mg  po bid 6/3  increase hydralazine to 50mg  po qid. 6/4 Cont clonidine patch.  Clonidine 0.1mg  po q6h prn sbp >160  4. Anemia Stable Check cbc in am tomorrow  5. Hypernatremia resolved.   6. Acute encephalopathy with acute respiratory failure  Currently axox3, Will limit benzo and sedating medications.   7. Dm2 Cont lantus 30 units [place] qday  8. AKI Creatinine stable,  Check cmp in am tomorrow  8. Protein calorie malnutrition  9. Hypokalemia Replete  Code Status :  FULL CODE  Family Communication  : wife 6/3  Disposition Plan  : home  Barriers For Discharge : none  Consults  :  GI, surgery  Procedures  : L IJ, , CT scan abd/ pelvis 6/3  DVT Prophylaxis  :  Heparin    Lab Results  Component Value Date   PLT 325 01/03/2016    Antibiotics  :  primaxin  Anti-infectives    Start     Dose/Rate Route Frequency Ordered Stop   12/26/15 1530  erythromycin ethylsuccinate (EES) 200 MG/5ML suspension 200 mg     200 mg Per Tube  Once 12/26/15 1520 12/26/15 1646   12/24/15 1200  imipenem-cilastatin (PRIMAXIN) 500 mg in sodium chloride 0.9 % 100 mL IVPB     500 mg 200 mL/hr over 30 Minutes Intravenous Every 6 hours 12/24/15 0913     12/24/15 0930  vancomycin (VANCOCIN) IVPB 1000 mg/200 mL premix  Status:  Discontinued      1,000 mg 200 mL/hr over 60 Minutes Intravenous Every 12 hours 12/24/15 0913 12/26/15 0937   12/23/15 0800  imipenem-cilastatin (PRIMAXIN) 500 mg in sodium chloride 0.9 % 100 mL IVPB  Status:  Discontinued     500 mg 200 mL/hr over 30 Minutes Intravenous Every 8 hours 12/23/15 0705 12/24/15 0913        Objective:   Filed Vitals:   01/01/16 2103 01/02/16 1302 01/02/16 2213 01/03/16 0513  BP: 162/85 144/73 158/85 153/80  Pulse: 105 68 93 78  Temp: 98.1 F (36.7 C) 98.5 F (36.9 C) 99.3 F (37.4 C) 99.6 F (37.6 C)  TempSrc: Oral  Oral Oral  Resp:  19 20 20   Height:      Weight:      SpO2: 93% 95% 97% 96%    Wt Readings from Last 3 Encounters:  12/31/15 122.471 kg (270 lb)     Intake/Output Summary (Last 24 hours) at 01/03/16 0805 Last data filed at 01/02/16 1859  Gross per 24 hour  Intake    900 ml  Output    300 ml  Net    600 ml     Physical Exam  Awake Alert, Oriented X 3, No new F.N deficits, Normal affect [place].AT,PERRAL Supple Neck,No JVD, No cervical lymphadenopathy appriciated.  Symmetrical Chest wall movement, Good air movement bilaterally, CTAB RRR,No Gallops,Rubs or new Murmurs, No Parasternal Heave +ve B.Sounds, Abd Soft, No tenderness, No organomegaly appriciated, No rebound - guarding or rigidity. Morbid obeisity No Cyanosis, Clubbing or edema, No new Rash or bruise  L ij in place   Data Review:    CBC  Recent Labs Lab 12/30/15 0350 01/01/16 0343 01/02/16 0431 01/03/16 0510  WBC 16.3* 15.2* 11.5* 10.9*  HGB 11.8* 10.2* 10.5* 9.7*  HCT 37.5* 32.6* 33.1* 30.8*  PLT 323 253 322 325  MCV 95.7 93.9 93.0 91.9  MCH 30.1 29.4 29.5 29.0  MCHC 31.5 31.3 31.7 31.5  RDW 14.9 13.5 13.3 13.4  LYMPHSABS  --   --   --  1.0  MONOABS  --   --   --  0.8  EOSABS  --   --   --  0.2  BASOSABS  --   --   --  0.0    Chemistries   Recent Labs Lab 12/28/15 0425  12/30/15 0350 12/31/15 0430 01/01/16 0343 01/02/16 0431 01/03/16 0510  NA 153*  < > 154*  145 141 141 139  K 3.4*  < > 3.4* 3.1* 3.0* 3.1* 2.8*  CL 119*  < > 120* 110 105 105 107  CO2 28  < > 29 27 27 29 26   GLUCOSE 225*  < > 182* 294* 276* 187* 186*  BUN 19  < > 26* 24* 16 11 9   CREATININE 1.19  < > 1.30* 1.36* 1.11 1.10 1.06  CALCIUM 7.5*  < > 7.7* 7.4* 7.5* 7.8* 7.4*  MG  --   --   --   --   --   --  1.9  AST 23  --   --   --   --  481* 64*  ALT 24  --   --   --   --  404* 182*  ALKPHOS 65  --   --   --   --  136* 102  BILITOT 0.9  --   --   --   --  2.2* 0.9  < > = values in this interval not displayed. ------------------------------------------------------------------------------------------------------------------ No results for input(s): CHOL, HDL, LDLCALC, TRIG, CHOLHDL, LDLDIRECT in the last 72 hours.  Lab Results  Component Value Date   HGBA1C 6.5* 12/31/2015   ------------------------------------------------------------------------------------------------------------------ No results for input(s): TSH, T4TOTAL, T3FREE, THYROIDAB in the last 72 hours.  Invalid input(s): FREET3 ------------------------------------------------------------------------------------------------------------------ No results for input(s): VITAMINB12, FOLATE, FERRITIN, TIBC, IRON, RETICCTPCT in the last 72 hours.  Coagulation profile No results for input(s): INR, PROTIME in the last 168 hours.  No results for input(s): DDIMER in the last 72 hours.  Cardiac Enzymes No results for input(s): CKMB, TROPONINI, MYOGLOBIN in the last 168 hours.  Invalid input(s): CK ------------------------------------------------------------------------------------------------------------------    Component Value Date/Time   BNP 151.9* 12/24/2015 0757    Inpatient Medications  Scheduled Meds: . antiseptic oral rinse  7 mL Mouth Rinse BID  . cloNIDine  0.2 mg Transdermal Weekly  . heparin subcutaneous  5,000 Units Subcutaneous Q8H  . hydrALAZINE  50 mg Oral Q8H  . imipenem-cilastatin  500 mg  Intravenous Q6H  . insulin aspart  0-15 Units Subcutaneous TID WC  . insulin aspart  0-5 Units Subcutaneous QHS  . insulin aspart  5 Units Subcutaneous TID WC  . insulin glargine  30 Units Subcutaneous Daily  . metoprolol tartrate  75 mg Oral BID  . pantoprazole  40 mg Oral QHS  . potassium chloride  10 mEq Intravenous Q1 Hr x 3  . sodium chloride flush  10-40 mL Intracatheter Q12H   Continuous Infusions:  PRN Meds:.acetaminophen, cloNIDine, diphenhydrAMINE, hydrALAZINE, nitroGLYCERIN, ondansetron **OR** ondansetron (ZOFRAN) IV, sodium chloride flush  Micro Results Recent Results (from the past 240 hour(s))  C difficile quick scan w PCR reflex     Status: None   Collection Time: 12/31/15  1:52 PM  Result Value Ref Range Status  C Diff antigen NEGATIVE NEGATIVE Final   C Diff toxin NEGATIVE NEGATIVE Final   C Diff interpretation Negative for toxigenic C. difficile  Final    Radiology Reports Dg Abd 1 View  12/25/2015  CLINICAL DATA:  Feeding tube placement EXAM: ABDOMEN - 1 VIEW COMPARISON:  None. FINDINGS: Feeding tube has been placed. Tip overlies the level of the descending portion of the duodenum. There is mild patient motion artifact, limiting detail. IMPRESSION: Feeding tube tip overlying the level of the descending portion the duodenum. Electronically Signed   By: Norva Pavlov M.D.   On: 12/25/2015 14:41   Ct Abdomen Pelvis W Contrast  01/02/2016  CLINICAL DATA:  Follow up pancreatitis. EXAM: CT ABDOMEN AND PELVIS WITH CONTRAST TECHNIQUE: Multidetector CT imaging of the abdomen and pelvis was performed using the standard protocol following bolus administration of intravenous contrast. CONTRAST:  1 ISOVUE-300 IOPAMIDOL (ISOVUE-300) INJECTION 61% COMPARISON:  December 31, 2015 FINDINGS: Bibasilar atelectasis is identified. A small left pleural effusion remains. The lung bases are otherwise normal. Increased attenuation is seen in the subcutaneous fat of the lateral abdomen, suggesting  volume overload with associated skin thickening. The ventral wall is intact. No free air. There is free fluid in the abdomen. This appears to be secondary to the patient's known severe pancreatitis. No abnormal gas is seen within the pancreatic parenchyma. No pancreatic hemorrhage is identified. However, there is diffusely decreased enhancement of the pancreas which is seen diffusely. However, there is still a small amount of enhancement in the pancreatic head, neck, and proximal body. No significant enhancement is seen in the tail. Diffuse surrounding stranding is consistent with inflammation. There is fluid surrounding the pancreas as well which is similar in the interval. No definitive walled off fluid collections are seen to suggest pseudocysts. However, the patient is certainly at risk of developing pseudocyst. Hepatic steatosis is seen. The liver is otherwise normal. No stones are seen in the gallbladder. No definite wall thickening. The portal vein remains patent. The SMV, SMA, and splenic veins remain patent as well. There may be minimal narrowing of the proximal SMV which is unchanged. The spleen, adrenal glands, and kidneys are normal. The abdominal aorta is non aneurysmal. A few shotty nodes are seen in the abdomen, likely reactive. The stomach is normal in appearance. There is probably some duodenal wall thickening, secondary to the pancreatitis. The remainder of the small bowel is normal. The colon and appendix are unremarkable. No adenopathy or mass is seen in the pelvis.  The bladder is normal. No acute bony abnormalities. Delayed imaging through the upper abdomen is unremarkable. IMPRESSION: 1. Severe pancreatitis with surrounding inflammation and fluid but no definitive walled off fluid collections/pseudocysts. There is poor enhancement throughout much of the pancreas, particularly in the tail, suggesting areas of pancreatic necrosis. There is a least some enhancement remaining in the pancreatic  head, neck, and proximal body. No venous thrombosis seen at this time. 2. By report, the patient had stones in the gallbladder on recent ultrasound which are not visualized on this study. Electronically Signed   By: Gerome Sam III M.D   On: 01/02/2016 17:35   Ct Abdomen Pelvis W Contrast  12/31/2015  CLINICAL DATA:  History of acute pancreatitis. Hypertension and elevated liver function tests. EXAM: CT ABDOMEN AND PELVIS WITH CONTRAST TECHNIQUE: Multidetector CT imaging of the abdomen and pelvis was performed using the standard protocol following bolus administration of intravenous contrast. CONTRAST:  ISOVUE-300 IOPAMIDOL (ISOVUE-300) INJECTION 61% COMPARISON:  None. FINDINGS: Lung bases: Left greater than right lower lobe atelectasis. Small left pleural effusion. Heart normal in size. Hepatobiliary: Decreased attenuation of the liver. Liver top-normal in size. No liver mass or focal lesion. Normal gallbladder. No bile duct dilation. Pancreas: There changes of extensive pancreatitis. There is patchy enhancement of the pancreas with relative decreased enhancement in the pancreatic tail. Pancreatic margins are ill-defined. Abnormal hypoattenuation extends from the pancreas to the periceliac and gastrohepatic ligament regions as well as the small bowel mesentery and along the right and left anterior para renal fascia. There is a small amount of ascites. There is no defined fluid collection to suggest a pseudocyst or abscess. Normal enhancement is seen in the superior mesenteric vein, the splenic vein and the portal vein. Spleen: Unremarkable. Adrenal glands, kidneys, ureters and bladder:  Unremarkable. Lymph nodes: There are several prominent to mildly enlarged lymph nodes. A posterior gastrohepatic ligament node measures 13 mm in short axis. A left periaortic retroperitoneal node measures 11 mm in short axis. Gastrointestinal: Nasogastric tube no longer visualized. Inflammatory changes surround the  second and third portions of the duodenum. Stomach is unremarkable. Small bowel and colon are unremarkable. Normal appendix visualized. Musculoskeletal: Disc degenerative changes most evident at L5-S1. No osteoblastic or osteolytic lesions. IMPRESSION: 1. Acute pancreatitis. There is extensive inflammation of the pancreas with areas of relative decreased enhancement, which could reflect areas of pancreatic necrosis. Follow-up evaluation and 1224 hours is suggested. 2. No evidence of an abscess or pseudocyst. No evidence of venous thrombosis. 3. Small amount ascites. 4. Gallstones not visualized by CT, but were visualized on the recent prior ultrasound from 12/22/2015. 5. Hepatic steatosis. Electronically Signed   By: Amie Portland M.D.   On: 12/31/2015 14:25   Dg Chest Port 1 View  12/29/2015  CLINICAL DATA:  Respiratory failure. EXAM: PORTABLE CHEST 1 VIEW COMPARISON:  11/27/2015. FINDINGS: Endotracheal tube tip not definitely identified over the trachea. Clinical correlation suggested . Following discussion of this finding with the patient's nurse I was informed that the patient has been extubated. Tubing over the upper chest may be related oxygen therapy . NG tube noted below left hemidiaphragm. Left IJ line in stable position . Heart size stable. Low lung volumes with bibasilar atelectasis. Left lower lobe infiltrate cannot be excluded. Stable left upper lobe calcified nodule consistent with granuloma. No pleural effusion or pneumothorax . IMPRESSION: 1. Endotracheal tube tip not definitely identified over the trachea. Clinical correlation suggested. Following discussion of this finding with the patient's nurse I was informed that the patient has been extubated. Tubing over the upper chest may be related to oxygen therapy. NG tube and left IJ line in stable position. 2. Low lung volumes with bibasilar atelectasis. Left lower lobe infiltrate cannot be excluded. Critical Value/emergent results were called by  telephone at the time of interpretation on 12/29/2015 at 6:57 am to the patient's nurse who verbally acknowledged these results. Electronically Signed   By: Maisie Fus  Register   On: 12/29/2015 07:02   Dg Chest Port 1 View  12/27/2015  CLINICAL DATA:  Acute respiratory failure. EXAM: PORTABLE CHEST 1 VIEW COMPARISON:  Yesterday. FINDINGS: Endotracheal tube in satisfactory position. Nasogastric tube extending into the stomach. A feeding tube is also seen extending into the distal esophagus, not visualized more distally due to thickness of overlying soft tissues. Left jugular catheter tip in the proximal superior vena cava. Minimal left basilar airspace opacity with improvement and stable linear density in the right lower lung zone. The  heart remains grossly normal in size. Small bilateral calcified granulomata are noted. Unremarkable bones. IMPRESSION: 1. Minimal left basilar atelectasis or pneumonia with improvement. 2. Small amount of linear atelectasis or pleural fluid in the minor fissure in the right lower lung zone. Electronically Signed   By: Beckie Salts M.D.   On: 12/27/2015 07:50   Dg Chest Port 1 View  12/26/2015  CLINICAL DATA:  Pancreatitis EXAM: PORTABLE CHEST 1 VIEW COMPARISON:  12/24/2015 FINDINGS: Bilateral mild interstitial thickening. No pleural effusion or pneumothorax. Relatively low lung volumes. Stable cardiomediastinal silhouette. Endotracheal tube with the tip 4 cm above the carina. Nasogastric tube coursing below the diaphragm. Left IJ central venous catheter with the tip projecting over the confluence of the SVC/left brachiocephalic vein. IMPRESSION: 1. Stable support lines and tubing. 2. Mild pulmonary vascular congestion. Electronically Signed   By: Elige Ko   On: 12/26/2015 09:25   Dg Chest Port 1 View  12/24/2015  CLINICAL DATA:  Central line placement EXAM: PORTABLE CHEST 1 VIEW COMPARISON:  12/24/2015 FINDINGS: Borderline cardiomegaly. Endotracheal tube in place with tip 3.2  cm above the carina. NG tube in place. Central mild vascular congestion and mild perihilar interstitial prominence suspicious for mild interstitial edema. Left IJ central line with tip in SVC. No pneumothorax. Mild basilar atelectasis. IMPRESSION: Borderline cardiomegaly. Endotracheal tube and NG tube in place. Left IJ central line with tip in SVC. No pneumothorax. Mild basilar atelectasis. Central mild vascular congestion and mild perihilar interstitial prominence suspicious for mild interstitial edema pre Electronically Signed   By: Natasha Mead M.D.   On: 12/24/2015 10:27   Dg Chest Port 1 View  12/24/2015  CLINICAL DATA:  40 year old male with shortness of breath EXAM: PORTABLE CHEST 1 VIEW COMPARISON:  Prior chest x-ray 12/21/2015 FINDINGS: Lower inspiratory volumes with increased bibasilar opacities favored to reflect atelectasis. Low inspiratory volumes results in pulmonary vascular crowding. Cardiac and mediastinal contours are within normal limits. Calcified granuloma in the left upper lobe, stable. No acute osseous abnormality. No large effusion, pneumothorax or edema. IMPRESSION: Lower inspiratory volumes with increased bibasilar opacities which are favored to reflect atelectasis. Electronically Signed   By: Malachy Moan M.D.   On: 12/24/2015 08:06   Dg Chest Portable 1 View  12/21/2015  CLINICAL DATA:  Substernal pain epigastric pain beginning tonight. EXAM: PORTABLE CHEST 1 VIEW COMPARISON:  None. FINDINGS: Lungs are adequately inflated without consolidation or effusion. Cardiomediastinal silhouette is within normal. Bones and soft tissues are normal. IMPRESSION: No active disease. Electronically Signed   By: Elberta Fortis M.D.   On: 12/21/2015 23:49   Dg Abd Portable 1v  12/30/2015  CLINICAL DATA:  eval for position of NG tube - concerned it is too far in EXAM: PORTABLE ABDOMEN - 1 VIEW COMPARISON:  12/29/2015 FINDINGS: Nasal/ orogastric tube passes below the diaphragm. Tip projects in the  distal stomach. There is mild gaseous distention of the visualized bowel similar to the prior study. IMPRESSION: Well-positioned nasal/orogastric tube. Electronically Signed   By: Amie Portland M.D.   On: 12/30/2015 09:07   Dg Abd Portable 1v  12/29/2015  CLINICAL DATA:  NG tube placement. EXAM: PORTABLE ABDOMEN - 1 VIEW COMPARISON:  Yesterday at 2216 hour FINDINGS: Tip and side port of the enteric tube projects over the expected location of the stomach. Mild gaseous distention of large and small bowel loops in the upper abdomen. IMPRESSION: Tip and side port of the enteric tube project over the expected location of the stomach.  Electronically Signed   By: Rubye Oaks M.D.   On: 12/29/2015 04:21   Dg Abd Portable 1v  12/28/2015  CLINICAL DATA:  Nasogastric tube placement.  Initial encounter. EXAM: PORTABLE ABDOMEN - 1 VIEW COMPARISON:  Abdominal radiograph performed earlier today at 9:13 p.m. FINDINGS: The patient's enteric tube is seen ending overlying the antrum of the stomach. The visualized bowel gas pattern is grossly unremarkable, with scattered air filled loops of small bowel seen. No free intra-abdominal air is identified, though evaluation for free air is limited on a single supine view. The visualized lung bases are grossly clear. No acute osseous abnormalities are seen. IMPRESSION: Enteric tube seen ending overlying the antrum of the stomach. Electronically Signed   By: Roanna Raider M.D.   On: 12/28/2015 22:32   Dg Abd Portable 1v  12/28/2015  CLINICAL DATA:  Encounter for nasogastric tube placement. EXAM: PORTABLE ABDOMEN - 1 VIEW COMPARISON:  Earlier this day at 1647 hour FINDINGS: The previous weighted enteric tube is no longer seen. A new enteric tube is seen with its tip in the region of distal esophagus, only faintly visualized due to habitus in overlying monitoring devices. Recommend advancement. IMPRESSION: Tip of the enteric tube in the distal esophagus. Recommend advancement of  at least 10 cm. Electronically Signed   By: Rubye Oaks M.D.   On: 12/28/2015 21:24   Dg Abd Portable 1v  12/28/2015  CLINICAL DATA:  40 year old male post feeding tube placement. Subsequent encounter. EXAM: PORTABLE ABDOMEN - 1 VIEW COMPARISON:  12/25/2015. FINDINGS: Feeding tube tip at the expected level of the distal duodenum/ligament of [place]. Gas-filled prominent size transverse colon. No gross free intraperitoneal narrow although evaluation limited by motion. Remainder of exam limited. IMPRESSION: Feeding tube tip at the expected level of the distal duodenum/ligament of [place]. Electronically Signed   By: Lacy Duverney M.D.   On: 12/28/2015 17:41   Ct Angio Chest/abd/pel For Dissection W And/or W/wo  12/22/2015  CLINICAL DATA:  Chest pressure and epigastric pain for 1 day. Nausea and vomiting with diaphoresis. Clinical concern for dissection. EXAM: CT ANGIOGRAPHY CHEST, ABDOMEN AND PELVIS TECHNIQUE: Multidetector CT imaging through the chest, abdomen and pelvis was performed using the standard protocol during bolus administration of intravenous contrast. Multiplanar reconstructed images and MIPs were obtained and reviewed to evaluate the vascular anatomy. CONTRAST:  100 cc Isovue 370 IV COMPARISON:  Chest radiograph earlier this day. FINDINGS: CTA CHEST FINDINGS Normal caliber thoracic aorta without aneurysm, dissection, hematoma or acute aortic abnormality. There is a conventional branching pattern from the aortic arch. No significant atherosclerosis. No filling defects in the central pulmonary arteries to suggest pulmonary embolus. Heart is normal in size. Left hilar calcification, sequela of prior granulomatous disease. No pericardial effusion. No adenopathy. Minimal dependent atelectasis in both lower lobes. 3 mm pleural based nodule in the right lower lobe series 5, image 35 is likely a calcified granuloma. 4 mm left upper lobe granuloma, series 5, image 15. No pleural effusion. Mild distal  esophageal thickening. Included thyroid gland is normal. There are no acute or suspicious osseous abnormalities. Mild degenerative disc disease in the thoracic spine. Review of the MIP images confirms the above findings. CTA ABDOMEN AND PELVIS FINDINGS Normal caliber abdominal aorta without dissection or aneurysm. No significant atherosclerosis. Celiac, superior mesenteric, and inferior mesenteric arteries are widely patent. Single bilateral renal arteries are patent, early branching of the left renal artery. Bilateral common and external iliac arteries are normal. Extensive peripancreatic fat stranding extending  from the head, through the body and tail. There is adjacent peripancreatic free fluid, no loculated fluid collection. Moderate fluid tracks in the right and left anterior perirenal space into the pericolic gutter. Fluid tracks in the left upper quadrant about the medial spleen. Decreased density of the liver consistent with steatosis. Mild gallbladder distention, no calcified stone. Arterial phase imaging of the spleen and adrenal glands is normal. Symmetric renal enhancement, no hydronephrosis. Stomach distended with ingested contents. Mild bowel wall thickening of small bowel loops in the left upper quadrant, likely reactive secondary to pancreatic inflammation. No small bowel distention. The appendix is normal. Small volume colonic stool. Minimal diverticulosis of sigmoid colon, no diverticulitis. No free air. No evidence of retroperitoneal or mesenteric adenopathy. In the pelvis the bladder is physiologically distended. Prostate gland normal in size. Fat within both inguinal canals. Scattered degenerative change throughout the lumbar spine with degenerative disc disease at L5-S1 and facet arthropathy most prominent at L3-L4. Review of the MIP images confirms the above findings. IMPRESSION: 1. Normal caliber thoracoabdominal aorta without dissection or acute aortic abnormality. 2. Marked peripancreatic  inflammation consistent with pancreatitis. Peripancreatic fluid tracking in the anterior para renal space and both pericolic gutters. 3. Mild gallbladder distention, no calcified stone. Hepatic steatosis. 4. Sequela of granulomatous disease in the chest. Electronically Signed   By: Rubye OaksMelanie  Ehinger M.D.   On: 12/22/2015 01:24   [place] Abdomen Limited Ruq  12/22/2015  CLINICAL DATA:  Initial evaluation for acute chest pain, epigastric pressure, nausea, vomiting. EXAM: US ABDOMEN LIMITED - RIGHT UPPER QUADRANT COMPARISON:  None. FINDINGS: Gallbladder: Multiple echogenic stones present within the gallbladder lumen, largest of which measured 2.4 cm. Small amount of sludge present as well. Gallbladder wall measured within normal limits at 3 mm. No free pericholecystic fluid. No sonographic Murphy sign elicited on exam. Probable focal fatty sparing noted within the liver adjacent to the gallbladder fossa. Common bile duct: Diameter: 4 mm Liver: Diffusely increased echogenicity, suggestive of steatosis. No focal lesions. IMPRESSION: 1. Cholelithiasis without sonographic evidence for acute cholecystitis or biliary dilatation. 2. Hepatic steatosis. Electronically Signed   By: Rise MuBenjamin  McClintock M.D.   On: 12/22/2015 04:04    Time Spent in minutes  30   Pearson GrippeJames Shana Zavaleta M.D on 01/03/2016 at 8:05 AM  Between 7am to 7pm - Pager - [phone]  After 7pm go to [email or web address] - password [email or web address]  [facility] -  Office  [phone]

## 2016-01-03 NOTE — Progress Notes (Signed)
Pharmacy Antibiotic Note  Timothy [name] is a 40 y.o. male admitted on 12/21/2015 with acute pancreatitis.    Day #12 of imipenem for necrotizing pancreatitis. No signs of infection in pancreas on CT. Afebrile, WBC down to 10.9.  Plan: Continue imipenem 500mg  IV Q6 Monitor clinical picture, renal function F/U LOT   Height: 5\' 11"  (180.3 cm) Weight: 270 lb (122.471 kg) IBW/kg (Calculated) : 75.3  Temp (24hrs), Avg:99.5 F (37.5 C), Min:99.3 F (37.4 C), Max:99.6 F (37.6 C)   Recent Labs Lab 12/30/15 0350 12/31/15 0430 01/01/16 0343 01/02/16 0431 01/03/16 0510  WBC 16.3*  --  15.2* 11.5* 10.9*  CREATININE 1.30* 1.36* 1.11 1.10 1.06    Estimated Creatinine Clearance: 122.2 mL/min (by C-G formula based on Cr of 1.06).    No Known Allergies  Antimicrobials this admission: Imipenem 5/24 >>  Vanc 5/25 >> 5/27  Enzo BiNathan Cherith Tewell, PharmD, BCPS Clinical Pharmacist Pager [phone] 01/03/2016 1:17 PM

## 2016-01-03 NOTE — Progress Notes (Signed)
Patient ID: Timothy [name], male   DOB: 29-Sep-1975, 40 y.o.   MR[facility] [id number] [facility]  Timothy [name] 40 y.o. 29-Sep-1975   Subjective: Tolerating full liquids without worsened abdominal pain. Feels swollen all over his body. Wife and daughter in room.  Objective: Vital signs in last 24 hours: Filed Vitals:   01/02/16 2213 01/03/16 0513  BP: 158/85 153/80  Pulse: 93 78  Temp: 99.3 F (37.4 C) 99.6 F (37.6 C)  Resp: 20 20    Physical Exam: Gen: lethargic, no acute distress, obese HEENT: anicteric sclera CV: RRR Chest: CTA B Abd: RUQ and epigastric tenderness without guarding, soft, nondistended, +BS Ext: trace LE edema  Lab Results:  Recent Labs  01/02/16 0431 01/03/16 0510  NA 141 139  K 3.1* 2.8*  CL 105 107  CO2 29 26  GLUCOSE 187* 186*  BUN 11 9  CREATININE 1.10 1.06  CALCIUM 7.8* 7.4*  MG  --  1.9    Recent Labs  01/02/16 0431 01/03/16 0510  AST 481* 64*  ALT 404* 182*  ALKPHOS 136* 102  BILITOT 2.2* 0.9  PROT 5.5* 5.0*  ALBUMIN 2.0* 1.8*    Recent Labs  01/02/16 0431 01/03/16 0510  WBC 11.5* 10.9*  NEUTROABS  --  8.9*  HGB 10.5* 9.7*  HCT 33.1* 30.8*  MCV 93.0 91.9  PLT 322 325   No results for input(s): LABPROT, INR in the last 72 hours.    Assessment/Plan: Severe acute pancreatitis with persistent necrosis who is stable on IV Abx. Tolerating full liquids without worsened abdominal pain. Change to low fat soft diet. LFTs improving. Continue IV Abx. Clinically doing well and tolerating oral nutrition. If he tolerates low fat soft diet, then may be able to d/c in 2-3 days. Continue supportive care.   Amillion Scobee C. 01/03/2016, 12:16 PM  Pager [phone]  If no answer or after 5 PM call [phone]

## 2016-01-04 DIAGNOSIS — F411 Generalized anxiety disorder: Secondary | ICD-10-CM | POA: Diagnosis not present

## 2016-01-04 LAB — CBC
HCT: 30.9 % — ABNORMAL LOW (ref 39.0–52.0)
Hemoglobin: 10 g/dL — ABNORMAL LOW (ref 13.0–17.0)
MCH: 29.7 pg (ref 26.0–34.0)
MCHC: 32.4 g/dL (ref 30.0–36.0)
MCV: 91.7 fL (ref 78.0–100.0)
Platelets: 355 10*3/uL (ref 150–400)
RBC: 3.37 MIL/uL — ABNORMAL LOW (ref 4.22–5.81)
RDW: 13.7 % (ref 11.5–15.5)
WBC: 11.4 10*3/uL — ABNORMAL HIGH (ref 4.0–10.5)

## 2016-01-04 LAB — COMPREHENSIVE METABOLIC PANEL
ALT: 100 U/L — ABNORMAL HIGH (ref 17–63)
AST: 31 U/L (ref 15–41)
Albumin: 1.9 g/dL — ABNORMAL LOW (ref 3.5–5.0)
Alkaline Phosphatase: 84 U/L (ref 38–126)
Anion gap: 6 (ref 5–15)
BUN: 7 mg/dL (ref 6–20)
CO2: 25 mmol/L (ref 22–32)
Calcium: 7.4 mg/dL — ABNORMAL LOW (ref 8.9–10.3)
Chloride: 107 mmol/L (ref 101–111)
Creatinine, Ser: 0.98 mg/dL (ref 0.61–1.24)
GFR calc Af Amer: >60 mL/min (ref >60)
GFR calc non Af Amer: >60 mL/min (ref >60)
Glucose, Bld: 242 mg/dL — ABNORMAL HIGH (ref 65–99)
Potassium: 2.8 mmol/L — ABNORMAL LOW (ref 3.5–5.1)
Sodium: 138 mmol/L (ref 135–145)
Total Bilirubin: 1 mg/dL (ref 0.3–1.2)
Total Protein: 5 g/dL — ABNORMAL LOW (ref 6.5–8.1)

## 2016-01-04 LAB — HEPATITIS PANEL, ACUTE
HCV Ab: <0.1 s/co ratio (ref 0.0–0.9)
Hep A IgM: NEGATIVE
Hep B C IgM: NEGATIVE
Hepatitis B Surface Ag: NEGATIVE

## 2016-01-04 LAB — GLUCOSE, CAPILLARY
Glucose-Capillary: 258 mg/dL — ABNORMAL HIGH (ref 65–99)
Glucose-Capillary: 293 mg/dL — ABNORMAL HIGH (ref 65–99)
Glucose-Capillary: 300 mg/dL — ABNORMAL HIGH (ref 65–99)
Glucose-Capillary: 263 mg/dL — ABNORMAL HIGH (ref 65–99)

## 2016-01-04 MED ORDER — POTASSIUM CHLORIDE 10 MEQ/100ML IV SOLN
10.0000 meq | INTRAVENOUS | Status: AC
  Administered 2016-01-04 (×6): 10 meq via INTRAVENOUS
  Filled 2016-01-04 (×6): qty 100

## 2016-01-04 NOTE — Progress Notes (Signed)
Patient ID: Timothy [name] Pranger, male   DOB: 05/24/1976, 40 y.o.   MRN: [id number] [facility]  Timothy [name] Jodoin 40 y.o. 05/24/1976   Subjective: Resting comfortably. Denies abdominal pain. Tolerated soft diet without abdominal pain. Denies N/V.  Objective: Vital signs in last 24 hours: Filed Vitals:   01/03/16 2135 01/04/16 0514  BP: 152/72 155/77  Pulse: 95 82  Temp: 98.1 F (36.7 C) 98 F (36.7 C)  Resp: 28 19    Physical Exam: Gen: alert, no acute distress, obese HEENT: anicteric sclera CV: RRR Chest: CTA B Abd: epigastric and RUQ tenderness with guarding, soft, nondistended, +BS Ext: 1+ pitting LE edema  Lab Results:  Recent Labs  01/03/16 0510 01/04/16 0620  NA 139 138  K 2.8* 2.8*  CL 107 107  CO2 26 25  GLUCOSE 186* 242*  BUN 9 7  CREATININE 1.06 0.98  CALCIUM 7.4* 7.4*  MG 1.9  --     Recent Labs  01/03/16 0510 01/04/16 0620  AST 64* 31  ALT 182* 100*  ALKPHOS 102 84  BILITOT 0.9 1.0  PROT 5.0* 5.0*  ALBUMIN 1.8* 1.9*    Recent Labs  01/03/16 0510 01/04/16 0620  WBC 10.9* 11.4*  NEUTROABS 8.9*  --   HGB 9.7* 10.0*  HCT 30.8* 30.9*  MCV 91.9 91.7  PLT 325 355   No results for input(s): LABPROT, INR in the last 72 hours.    Assessment/Plan: Necrotizing pancreatitis - improving clinically and tolerating soft food without abdominal pain. Patient needs to ambulate in halls and will defer to primary team whether to get PT to help him do that. Change to low fat diet at lunch. If he does well today and continues to tolerate POs without abdominal pain then may be able to go home tomorrow. Will need a f/u CT in 2-3 weeks to evaluate status of pancreatitis and see if any pseudocysts have developed.   Kessler Kopinski C. 01/04/2016, 8:04 AM  Pager [phone]  If no answer or after 5 PM call [phone]

## 2016-01-04 NOTE — Progress Notes (Signed)
Patient ID: Timothy [name], male   DOB: Jun 09, 1976, 40 y.o.   MRN: [id number]                                                                PROGRESS NOTE                                                                                                                                                                                                             Patient Demographics:    Timothy [name], is a 40 y.o. male, DOB - March 25, 1976, WUJ:[id number]  Admit date - 12/21/2015   Admitting Physician Michael Litter, MD  Outpatient Primary MD for the patient is Clayborn Heron, MD  LOS - 13  Outpatient Specialists:  Chief Complaint  Patient presents with  . Chest Pain  . Abdominal Pain       Brief Narrative   40 year old male with history of hypertension, presented to the emergency department 12/22/2015 with complaints of epigastric pain, nausea and vomiting. Patient admitted for sepsis/acute pancreatitis abnormal LFTs. Right upper quadrant ultrasound showed cholelithiasis without evidence of acute cholecystitis or biliary dilatation. CTA chest, abdomen and pelvis, showed. Pancreatic inflammation consistent with pancreatitis, per pancreatic fluid tracking in the anterior pararenal space and both pericolic gutters, mild gallbladder distention no calcified stone. Surgery was consulted for possible laparoscopic cholecystectomy. On 12/24/2015 pt had acute respiratory distress with worsening tachycardia and pt transferred to ICU and intubated. TRH resumed care on 12/30/2015. GI consultation in place, along with surgery.    Subjective:   Eating well, no complaints except weakness   Assessment  & Plan :    Principal Problem:   Acute pancreatitis Active Problems:   Hypocalcemia   AKI (acute kidney injury) (HCC)   Accelerated hypertension   Leukocytosis   Lactic acidosis   Hyperglycemia   Obesity   Hypokalemia   Acute respiratory failure (HCC)   Pancreatitis, acute   Acute respiratory  failure with hypoxia (HCC)   1. Acute severe pancreatitis with shock/sepsis, acute respiratory failure complicated by ilieus Repeat CT scan 6/3 => severe pancreatitis,  No pseudocyst.advance to low fat diet as tolerated.  Appreciate GI input.Appreciate surgery input as well, F/u outpatient for elective cholecystectomy S/p 2 weeks of inepenem  2. ABnormal lft improving,   hepatitis panel negative  3. Hypertension uncontrolled Increase metoprolol to 75mg  po bid 6/3  increase hydralazine to 50mg  po qid. 6/4 Cont clonidine patch.  Clonidine 0.1mg  po q6h prn sbp >160  4. Anemia of critical illness- outpatient follow up Stable   5. Hypernatremia resolved.   6. Acute encephalopathy with acute respiratory failure  Currently axox3, Will limit benzo and sedating medications.   7. Dm2 Cont lantus 30 units [place] qday  8. AKI resolved 8. Protein calorie malnutrition  9. Hypokalemia Replete IV  Code Status :  FULL CODE  Family Communication  : no family  Disposition Plan  : home  Barriers For Discharge : none  Consults  :  GI, surgery  Procedures  : L IJ, , CT scan abd/ pelvis 6/3  DVT Prophylaxis  :  Heparin [place]   Lab Results  Component Value Date   PLT 355 01/04/2016    Antibiotics  :  primaxin- d/c'd  Anti-infectives    Start     Dose/Rate Route Frequency Ordered Stop   12/26/15 1530  erythromycin ethylsuccinate (EES) 200 MG/5ML suspension 200 mg     200 mg Per Tube  Once 12/26/15 1520 12/26/15 1646   12/24/15 1200  imipenem-cilastatin (PRIMAXIN) 500 mg in sodium chloride 0.9 % 100 mL IVPB     500 mg 200 mL/hr over 30 Minutes Intravenous Every 6 hours 12/24/15 0913 01/04/16 2359   12/24/15 0930  vancomycin (VANCOCIN) IVPB 1000 mg/200 mL premix  Status:  Discontinued     1,000 mg 200 mL/hr over 60 Minutes Intravenous Every 12 hours 12/24/15 0913 12/26/15 0937   12/23/15 0800  imipenem-cilastatin (PRIMAXIN) 500 mg in sodium chloride 0.9 % 100 mL IVPB  Status:   Discontinued     500 mg 200 mL/hr over 30 Minutes Intravenous Every 8 hours 12/23/15 0705 12/24/15 0913        Objective:   Filed Vitals:   01/03/16 0513 01/03/16 1513 01/03/16 2135 01/04/16 0514  BP: 153/80 154/79 152/72 155/77  Pulse: 78 84 95 82  Temp: 99.6 F (37.6 C) 99.1 F (37.3 C) 98.1 F (36.7 C) 98 F (36.7 C)  TempSrc: Oral Oral  Oral  Resp: 20 18 28 19   Height:      Weight:      SpO2: 96% 100% 94% 98%    Wt Readings from Last 3 Encounters:  12/31/15 122.471 kg (270 lb)     Intake/Output Summary (Last 24 hours) at 01/04/16 1430 Last data filed at 01/04/16 0954  Gross per 24 hour  Intake   1240 ml  Output      0 ml  Net   1240 ml     Physical Exam  Awake Alert, Oriented X 3, No new F.N deficits, Normal affect Symmetrical Chest wall movement,NAD RRR,No Gallops,Rubs or new Murmurs, No Parasternal Heave +ve B.Sounds. Morbid obeisity No Cyanosis, Clubbing or edema, No new Rash or bruise  L ij in place   Data Review:    CBC  Recent Labs Lab 12/30/15 0350 01/01/16 0343 01/02/16 0431 01/03/16 0510 01/04/16 0620  WBC 16.3* 15.2* 11.5* 10.9* 11.4*  HGB 11.8* 10.2* 10.5* 9.7* 10.0*  HCT 37.5* 32.6* 33.1* 30.8* 30.9*  PLT 323 253 322 325 355  MCV 95.7 93.9 93.0 91.9 91.7  MCH 30.1 29.4 29.5 29.0 29.7  MCHC 31.5 31.3 31.7 31.5 32.4  RDW 14.9 13.5 13.3 13.4 13.7  LYMPHSABS  --   --   --  1.0  --   MONOABS  --   --   --  0.8  --   EOSABS  --   --   --  0.2  --   BASOSABS  --   --   --  0.0  --     Chemistries   Recent Labs Lab 12/31/15 0430 01/01/16 0343 01/02/16 0431 01/03/16 0510 01/04/16 0620  NA 145 141 141 139 138  K 3.1* 3.0* 3.1* 2.8* 2.8*  CL 110 105 105 107 107  CO2 27 27 29 26 25   GLUCOSE 294* 276* 187* 186* 242*  BUN 24* 16 11 9 7   CREATININE 1.36* 1.11 1.10 1.06 0.98  CALCIUM 7.4* 7.5* 7.8* 7.4* 7.4*  MG  --   --   --  1.9  --   AST  --   --  481* 64* 31  ALT  --   --  404* 182* 100*  ALKPHOS  --   --  136* 102 84    BILITOT  --   --  2.2* 0.9 1.0   ------------------------------------------------------------------------------------------------------------------ No results for input(s): CHOL, HDL, LDLCALC, TRIG, CHOLHDL, LDLDIRECT in the last 72 hours.  Lab Results  Component Value Date   HGBA1C 6.5* 12/31/2015   ------------------------------------------------------------------------------------------------------------------ No results for input(s): TSH, T4TOTAL, T3FREE, THYROIDAB in the last 72 hours.  Invalid input(s): FREET3 ------------------------------------------------------------------------------------------------------------------ No results for input(s): VITAMINB12, FOLATE, FERRITIN, TIBC, IRON, RETICCTPCT in the last 72 hours.  Coagulation profile No results for input(s): INR, PROTIME in the last 168 hours.  No results for input(s): DDIMER in the last 72 hours.  Cardiac Enzymes No results for input(s): CKMB, TROPONINI, MYOGLOBIN in the last 168 hours.  Invalid input(s): CK ------------------------------------------------------------------------------------------------------------------    Component Value Date/Time   BNP 151.9* 12/24/2015 0757    Inpatient Medications  Scheduled Meds: . cloNIDine  0.2 mg Transdermal Weekly  . heparin subcutaneous  5,000 Units Subcutaneous Q8H  . hydrALAZINE  50 mg Oral Q6H  . imipenem-cilastatin  500 mg Intravenous Q6H  . insulin aspart  0-15 Units Subcutaneous TID WC  . insulin aspart  0-5 Units Subcutaneous QHS  . insulin aspart  5 Units Subcutaneous TID WC  . insulin glargine  30 Units Subcutaneous Daily  . metoprolol tartrate  75 mg Oral BID  . pantoprazole  40 mg Oral QHS  . potassium chloride  10 mEq Intravenous Q1 Hr x 6  . sodium chloride flush  10-40 mL Intracatheter Q12H   Continuous Infusions:  PRN Meds:.acetaminophen, cloNIDine, diphenhydrAMINE, hydrALAZINE, nitroGLYCERIN, ondansetron **OR** ondansetron (ZOFRAN) IV, sodium  chloride flush  Micro Results Recent Results (from the past 240 hour(s))  C difficile quick scan w PCR reflex     Status: None   Collection Time: 12/31/15  1:52 PM  Result Value Ref Range Status   C Diff antigen NEGATIVE NEGATIVE Final   C Diff toxin NEGATIVE NEGATIVE Final   C Diff interpretation Negative for toxigenic C. difficile  Final    Radiology Reports Dg Abd 1 View  12/25/2015  CLINICAL DATA:  Feeding tube placement EXAM: ABDOMEN - 1 VIEW COMPARISON:  None. FINDINGS: Feeding tube has been placed. Tip overlies the level of the descending portion of the duodenum. There is mild patient motion artifact, limiting detail. IMPRESSION: Feeding tube tip overlying the level of the descending portion the duodenum. Electronically Signed   By: Norva Pavlov M.D.   On: 12/25/2015 14:41   Ct Abdomen Pelvis W Contrast  01/02/2016  CLINICAL DATA:  Follow up pancreatitis. EXAM: CT ABDOMEN AND PELVIS WITH CONTRAST TECHNIQUE: Multidetector CT  imaging of the abdomen and pelvis was performed using the standard protocol following bolus administration of intravenous contrast. CONTRAST:  1 ISOVUE-300 IOPAMIDOL (ISOVUE-300) INJECTION 61% COMPARISON:  December 31, 2015 FINDINGS: Bibasilar atelectasis is identified. A small left pleural effusion remains. The lung bases are otherwise normal. Increased attenuation is seen in the subcutaneous fat of the lateral abdomen, suggesting volume overload with associated skin thickening. The ventral wall is intact. No free air. There is free fluid in the abdomen. This appears to be secondary to the patient's known severe pancreatitis. No abnormal gas is seen within the pancreatic parenchyma. No pancreatic hemorrhage is identified. However, there is diffusely decreased enhancement of the pancreas which is seen diffusely. However, there is still a small amount of enhancement in the pancreatic head, neck, and proximal body. No significant enhancement is seen in the tail. Diffuse  surrounding stranding is consistent with inflammation. There is fluid surrounding the pancreas as well which is similar in the interval. No definitive walled off fluid collections are seen to suggest pseudocysts. However, the patient is certainly at risk of developing pseudocyst. Hepatic steatosis is seen. The liver is otherwise normal. No stones are seen in the gallbladder. No definite wall thickening. The portal vein remains patent. The SMV, SMA, and splenic veins remain patent as well. There may be minimal narrowing of the proximal SMV which is unchanged. The spleen, adrenal glands, and kidneys are normal. The abdominal aorta is non aneurysmal. A few shotty nodes are seen in the abdomen, likely reactive. The stomach is normal in appearance. There is probably some duodenal wall thickening, secondary to the pancreatitis. The remainder of the small bowel is normal. The colon and appendix are unremarkable. No adenopathy or mass is seen in the pelvis.  The bladder is normal. No acute bony abnormalities. Delayed imaging through the upper abdomen is unremarkable. IMPRESSION: 1. Severe pancreatitis with surrounding inflammation and fluid but no definitive walled off fluid collections/pseudocysts. There is poor enhancement throughout much of the pancreas, particularly in the tail, suggesting areas of pancreatic necrosis. There is a least some enhancement remaining in the pancreatic head, neck, and proximal body. No venous thrombosis seen at this time. 2. By report, the patient had stones in the gallbladder on recent ultrasound which are not visualized on this study. Electronically Signed   By: Gerome Samavid  Williams III M.D   On: 01/02/2016 17:35   Ct Abdomen Pelvis W Contrast  12/31/2015  CLINICAL DATA:  History of acute pancreatitis. Hypertension and elevated liver function tests. EXAM: CT ABDOMEN AND PELVIS WITH CONTRAST TECHNIQUE: Multidetector CT imaging of the abdomen and pelvis was performed using the standard protocol  following bolus administration of intravenous contrast. CONTRAST:  100mL ISOVUE-300 IOPAMIDOL (ISOVUE-300) INJECTION 61% COMPARISON:  None. FINDINGS: Lung bases: Left greater than right lower lobe atelectasis. Small left pleural effusion. Heart normal in size. Hepatobiliary: Decreased attenuation of the liver. Liver top-normal in size. No liver mass or focal lesion. Normal gallbladder. No bile duct dilation. Pancreas: There changes of extensive pancreatitis. There is patchy enhancement of the pancreas with relative decreased enhancement in the pancreatic tail. Pancreatic margins are ill-defined. Abnormal hypoattenuation extends from the pancreas to the periceliac and gastrohepatic ligament regions as well as the small bowel mesentery and along the right and left anterior para renal fascia. There is a small amount of ascites. There is no defined fluid collection to suggest a pseudocyst or abscess. Normal enhancement is seen in the superior mesenteric vein, the splenic vein and the portal  vein. Spleen: Unremarkable. Adrenal glands, kidneys, ureters and bladder:  Unremarkable. Lymph nodes: There are several prominent to mildly enlarged lymph nodes. A posterior gastrohepatic ligament node measures 13 mm in short axis. A left periaortic retroperitoneal node measures 11 mm in short axis. Gastrointestinal: Nasogastric tube no longer visualized. Inflammatory changes surround the second and third portions of the duodenum. Stomach is unremarkable. Small bowel and colon are unremarkable. Normal appendix visualized. Musculoskeletal: Disc degenerative changes most evident at L5-S1. No osteoblastic or osteolytic lesions. IMPRESSION: 1. Acute pancreatitis. There is extensive inflammation of the pancreas with areas of relative decreased enhancement, which could reflect areas of pancreatic necrosis. Follow-up evaluation and 1224 hours is suggested. 2. No evidence of an abscess or pseudocyst. No evidence of venous thrombosis. 3.  Small amount ascites. 4. Gallstones not visualized by CT, but were visualized on the recent prior ultrasound from 12/22/2015. 5. Hepatic steatosis. Electronically Signed   By: Amie Portland M.D.   On: 12/31/2015 14:25   Dg Chest Port 1 View  12/29/2015  CLINICAL DATA:  Respiratory failure. EXAM: PORTABLE CHEST 1 VIEW COMPARISON:  11/27/2015. FINDINGS: Endotracheal tube tip not definitely identified over the trachea. Clinical correlation suggested . Following discussion of this finding with the patient's nurse I was informed that the patient has been extubated. Tubing over the upper chest may be related oxygen therapy . NG tube noted below left hemidiaphragm. Left IJ line in stable position . Heart size stable. Low lung volumes with bibasilar atelectasis. Left lower lobe infiltrate cannot be excluded. Stable left upper lobe calcified nodule consistent with granuloma. No pleural effusion or pneumothorax . IMPRESSION: 1. Endotracheal tube tip not definitely identified over the trachea. Clinical correlation suggested. Following discussion of this finding with the patient's nurse I was informed that the patient has been extubated. Tubing over the upper chest may be related to oxygen therapy. NG tube and left IJ line in stable position. 2. Low lung volumes with bibasilar atelectasis. Left lower lobe infiltrate cannot be excluded. Critical Value/emergent results were called by telephone at the time of interpretation on 12/29/2015 at 6:57 am to the patient's nurse who verbally acknowledged these results. Electronically Signed   By: Maisie Fus  Register   On: 12/29/2015 07:02   Dg Chest Port 1 View  12/27/2015  CLINICAL DATA:  Acute respiratory failure. EXAM: PORTABLE CHEST 1 VIEW COMPARISON:  Yesterday. FINDINGS: Endotracheal tube in satisfactory position. Nasogastric tube extending into the stomach. A feeding tube is also seen extending into the distal esophagus, not visualized more distally due to thickness of overlying  soft tissues. Left jugular catheter tip in the proximal superior vena cava. Minimal left basilar airspace opacity with improvement and stable linear density in the right lower lung zone. The heart remains grossly normal in size. Small bilateral calcified granulomata are noted. Unremarkable bones. IMPRESSION: 1. Minimal left basilar atelectasis or pneumonia with improvement. 2. Small amount of linear atelectasis or pleural fluid in the minor fissure in the right lower lung zone. Electronically Signed   By: Beckie Salts M.D.   On: 12/27/2015 07:50   Dg Chest Port 1 View  12/26/2015  CLINICAL DATA:  Pancreatitis EXAM: PORTABLE CHEST 1 VIEW COMPARISON:  12/24/2015 FINDINGS: Bilateral mild interstitial thickening. No pleural effusion or pneumothorax. Relatively low lung volumes. Stable cardiomediastinal silhouette. Endotracheal tube with the tip 4 cm above the carina. Nasogastric tube coursing below the diaphragm. Left IJ central venous catheter with the tip projecting over the confluence of the SVC/left brachiocephalic vein. IMPRESSION:  1. Stable support lines and tubing. 2. Mild pulmonary vascular congestion. Electronically Signed   By: Elige Ko   On: 12/26/2015 09:25   Dg Chest Port 1 View  12/24/2015  CLINICAL DATA:  Central line placement EXAM: PORTABLE CHEST 1 VIEW COMPARISON:  12/24/2015 FINDINGS: Borderline cardiomegaly. Endotracheal tube in place with tip 3.2 cm above the carina. NG tube in place. Central mild vascular congestion and mild perihilar interstitial prominence suspicious for mild interstitial edema. Left IJ central line with tip in SVC. No pneumothorax. Mild basilar atelectasis. IMPRESSION: Borderline cardiomegaly. Endotracheal tube and NG tube in place. Left IJ central line with tip in SVC. No pneumothorax. Mild basilar atelectasis. Central mild vascular congestion and mild perihilar interstitial prominence suspicious for mild interstitial edema pre Electronically Signed   By: Natasha Mead  M.D.   On: 12/24/2015 10:27   Dg Chest Port 1 View  12/24/2015  CLINICAL DATA:  40 year old male with shortness of breath EXAM: PORTABLE CHEST 1 VIEW COMPARISON:  Prior chest x-ray 12/21/2015 FINDINGS: Lower inspiratory volumes with increased bibasilar opacities favored to reflect atelectasis. Low inspiratory volumes results in pulmonary vascular crowding. Cardiac and mediastinal contours are within normal limits. Calcified granuloma in the left upper lobe, stable. No acute osseous abnormality. No large effusion, pneumothorax or edema. IMPRESSION: Lower inspiratory volumes with increased bibasilar opacities which are favored to reflect atelectasis. Electronically Signed   By: Malachy Moan M.D.   On: 12/24/2015 08:06   Dg Chest Portable 1 View  12/21/2015  CLINICAL DATA:  Substernal pain epigastric pain beginning tonight. EXAM: PORTABLE CHEST 1 VIEW COMPARISON:  None. FINDINGS: Lungs are adequately inflated without consolidation or effusion. Cardiomediastinal silhouette is within normal. Bones and soft tissues are normal. IMPRESSION: No active disease. Electronically Signed   By: Elberta Fortis M.D.   On: 12/21/2015 23:49   Dg Abd Portable 1v  12/30/2015  CLINICAL DATA:  eval for position of NG tube - concerned it is too far in EXAM: PORTABLE ABDOMEN - 1 VIEW COMPARISON:  12/29/2015 FINDINGS: Nasal/ orogastric tube passes below the diaphragm. Tip projects in the distal stomach. There is mild gaseous distention of the visualized bowel similar to the prior study. IMPRESSION: Well-positioned nasal/orogastric tube. Electronically Signed   By: Amie Portland M.D.   On: 12/30/2015 09:07   Dg Abd Portable 1v  12/29/2015  CLINICAL DATA:  NG tube placement. EXAM: PORTABLE ABDOMEN - 1 VIEW COMPARISON:  Yesterday at 2216 hour FINDINGS: Tip and side port of the enteric tube projects over the expected location of the stomach. Mild gaseous distention of large and small bowel loops in the upper abdomen. IMPRESSION:  Tip and side port of the enteric tube project over the expected location of the stomach. Electronically Signed   By: Rubye Oaks M.D.   On: 12/29/2015 04:21   Dg Abd Portable 1v  12/28/2015  CLINICAL DATA:  Nasogastric tube placement.  Initial encounter. EXAM: PORTABLE ABDOMEN - 1 VIEW COMPARISON:  Abdominal radiograph performed earlier today at 9:13 p.m. FINDINGS: The patient's enteric tube is seen ending overlying the antrum of the stomach. The visualized bowel gas pattern is grossly unremarkable, with scattered air filled loops of small bowel seen. No free intra-abdominal air is identified, though evaluation for free air is limited on a single supine view. The visualized lung bases are grossly clear. No acute osseous abnormalities are seen. IMPRESSION: Enteric tube seen ending overlying the antrum of the stomach. Electronically Signed   By: Beryle Beams.D.  On: 12/28/2015 22:32   Dg Abd Portable 1v  12/28/2015  CLINICAL DATA:  Encounter for nasogastric tube placement. EXAM: PORTABLE ABDOMEN - 1 VIEW COMPARISON:  Earlier this day at 1647 hour FINDINGS: The previous weighted enteric tube is no longer seen. A new enteric tube is seen with its tip in the region of distal esophagus, only faintly visualized due to habitus in overlying monitoring devices. Recommend advancement. IMPRESSION: Tip of the enteric tube in the distal esophagus. Recommend advancement of at least 10 cm. Electronically Signed   By: Rubye Oaks M.D.   On: 12/28/2015 21:24   Dg Abd Portable 1v  12/28/2015  CLINICAL DATA:  40 year old male post feeding tube placement. Subsequent encounter. EXAM: PORTABLE ABDOMEN - 1 VIEW COMPARISON:  12/25/2015. FINDINGS: Feeding tube tip at the expected level of the distal duodenum/ligament of [place]. Gas-filled prominent size transverse colon. No gross free intraperitoneal narrow although evaluation limited by motion. Remainder of exam limited. IMPRESSION: Feeding tube tip at the expected  level of the distal duodenum/ligament of [place]. Electronically Signed   By: Lacy Duverney M.D.   On: 12/28/2015 17:41   Ct Angio Chest/abd/pel For Dissection W And/or W/wo  12/22/2015  CLINICAL DATA:  Chest pressure and epigastric pain for 1 day. Nausea and vomiting with diaphoresis. Clinical concern for dissection. EXAM: CT ANGIOGRAPHY CHEST, ABDOMEN AND PELVIS TECHNIQUE: Multidetector CT imaging through the chest, abdomen and pelvis was performed using the standard protocol during bolus administration of intravenous contrast. Multiplanar reconstructed images and MIPs were obtained and reviewed to evaluate the vascular anatomy. CONTRAST:  100 cc Isovue 370 IV COMPARISON:  Chest radiograph earlier this day. FINDINGS: CTA CHEST FINDINGS Normal caliber thoracic aorta without aneurysm, dissection, hematoma or acute aortic abnormality. There is a conventional branching pattern from the aortic arch. No significant atherosclerosis. No filling defects in the central pulmonary arteries to suggest pulmonary embolus. Heart is normal in size. Left hilar calcification, sequela of prior granulomatous disease. No pericardial effusion. No adenopathy. Minimal dependent atelectasis in both lower lobes. 3 mm pleural based nodule in the right lower lobe series 5, image 35 is likely a calcified granuloma. 4 mm left upper lobe granuloma, series 5, image 15. No pleural effusion. Mild distal esophageal thickening. Included thyroid gland is normal. There are no acute or suspicious osseous abnormalities. Mild degenerative disc disease in the thoracic spine. Review of the MIP images confirms the above findings. CTA ABDOMEN AND PELVIS FINDINGS Normal caliber abdominal aorta without dissection or aneurysm. No significant atherosclerosis. Celiac, superior mesenteric, and inferior mesenteric arteries are widely patent. Single bilateral renal arteries are patent, early branching of the left renal artery. Bilateral common and external iliac  arteries are normal. Extensive peripancreatic fat stranding extending from the head, through the body and tail. There is adjacent peripancreatic free fluid, no loculated fluid collection. Moderate fluid tracks in the right and left anterior perirenal space into the pericolic gutter. Fluid tracks in the left upper quadrant about the medial spleen. Decreased density of the liver consistent with steatosis. Mild gallbladder distention, no calcified stone. Arterial phase imaging of the spleen and adrenal glands is normal. Symmetric renal enhancement, no hydronephrosis. Stomach distended with ingested contents. Mild bowel wall thickening of small bowel loops in the left upper quadrant, likely reactive secondary to pancreatic inflammation. No small bowel distention. The appendix is normal. Small volume colonic stool. Minimal diverticulosis of sigmoid colon, no diverticulitis. No free air. No evidence of retroperitoneal or mesenteric adenopathy. In the pelvis the bladder  is physiologically distended. Prostate gland normal in size. Fat within both inguinal canals. Scattered degenerative change throughout the lumbar spine with degenerative disc disease at L5-S1 and facet arthropathy most prominent at L3-L4. Review of the MIP images confirms the above findings. IMPRESSION: 1. Normal caliber thoracoabdominal aorta without dissection or acute aortic abnormality. 2. Marked peripancreatic inflammation consistent with pancreatitis. Peripancreatic fluid tracking in the anterior para renal space and both pericolic gutters. 3. Mild gallbladder distention, no calcified stone. Hepatic steatosis. 4. Sequela of granulomatous disease in the chest. Electronically Signed   By: Rubye Oaks M.D.   On: 12/22/2015 01:24   US Abdomen Limited Ruq  12/22/2015  CLINICAL DATA:  Initial evaluation for acute chest pain, epigastric pressure, nausea, vomiting. EXAM: US ABDOMEN LIMITED - RIGHT UPPER QUADRANT COMPARISON:  None. FINDINGS:  Gallbladder: Multiple echogenic stones present within the gallbladder lumen, largest of which measured 2.4 cm. Small amount of sludge present as well. Gallbladder wall measured within normal limits at 3 mm. No free pericholecystic fluid. No sonographic Murphy sign elicited on exam. Probable focal fatty sparing noted within the liver adjacent to the gallbladder fossa. Common bile duct: Diameter: 4 mm Liver: Diffusely increased echogenicity, suggestive of steatosis. No focal lesions. IMPRESSION: 1. Cholelithiasis without sonographic evidence for acute cholecystitis or biliary dilatation. 2. Hepatic steatosis. Electronically Signed   By: Rise Mu M.D.   On: 12/22/2015 04:04    Time Spent in minutes  30   JESSICA U VANN DO on 01/04/2016 at 2:30 PM  Between 7am to 7pm - Pager - [phone]  After 7pm go to [email or web address] - password [facility]  [facility] -  Office  [phone]

## 2016-01-04 NOTE — Progress Notes (Addendum)
Inpatient Diabetes Program Recommendations  AACE/ADA: New Consensus Statement on Inpatient Glycemic Control (2015)  Target Ranges:  Prepandial:   less than 140 mg/dL      Peak postprandial:   less than 180 mg/dL (1-2 hours)      Critically ill patients:  140 - 180 mg/dL   Lab Results  Component Value Date   GLUCAP 258* 01/04/2016   HGBA1C 6.5* 12/31/2015    Review of Glycemic Control  Diabetes history: Pre DM A1c 5.7% Outpatient Diabetes medications: None Current orders for Inpatient glycemic control: Lantus 30 units, Daily, Novolog Moderate TID + Novolog 5 units meal coverage  Inpatient Diabetes Program Recommendations:   1. Increase Lantus further to 35 units daily (0.3 units/kg dosing)  2. Increase Novolog Meal Coverage to 7 units tidwc  Note patient was not on medication prior to admission. A1c 5.7%. Patient admitted with pancreatitis. Looks like patient will need insulin (basal + correction scale) on discharge. Please place consult if this is the case.  Lantus Solostar pen and Novolog Flexpen preferred by insurance. Insulin pen needles (order # [id number])  Thanks,  Christena DeemShannon Micaiah Litle RN, MSN, [facility] Inpatient Diabetes Coordinator Team Pager [phone] (8a-5p)

## 2016-01-04 NOTE — Progress Notes (Signed)
Physical Therapy Treatment Patient Details Name: Carolin CoyQuinten Shands MRN: [id number] DOB: [birth year] Today's Date: 01/04/2016    History of Present Illness Pt is a 40 yo male admitted for pancreatitis, sepsis; developed respiratory distress, worsening tachycardia; transferred to ICU intubated 5/25-5/28. PMH: low back surgery in 2007.    PT Comments    Patient is progressing toward mobility goals. Ambulated 16500ft with several seated rest breaks. SpO2 remained 90% or > with mobility. Patient needs to practice stairs next session.     Follow Up Recommendations  Home health PT;Supervision/Assistance - 24 hour     Equipment Recommendations  Rolling walker with 5" wheels    Recommendations for Other Services       Precautions / Restrictions Precautions Precautions: Fall Restrictions Weight Bearing Restrictions: No    Mobility  Bed Mobility Overal bed mobility: Needs Assistance Bed Mobility: Supine to Sit     Supine to sit: HOB elevated;Modified independent (Device/Increase time)     General bed mobility comments: increased time and use of bed rail  Transfers Overall transfer level: Needs assistance Equipment used: None Transfers: Sit to/from Stand Sit to Stand: Supervision         General transfer comment: supervision for safety; cues for hand placement  Ambulation/Gait Ambulation/Gait assistance: Min guard Ambulation Distance (Feet): 100 Feet (3 seated rest breaks) Assistive device: Rolling walker (2 wheeled) Gait Pattern/deviations: Step-through pattern;Decreased stride length Gait velocity: decreased   General Gait Details: cues for cadence and stride length; 3 seated rest breaks due to SOB; SpO2 90 or > on RA; educated on pursed lip breathing and self monitoring need for breaks   Stairs            Wheelchair Mobility    Modified Rankin (Stroke Patients Only)       Balance Overall balance assessment: Needs assistance Sitting-balance support: No  upper extremity supported;Feet supported Sitting balance-Leahy Scale: Good     Standing balance support: Single extremity supported Standing balance-Leahy Scale: Fair                      Cognition Arousal/Alertness: Awake/alert Behavior During Therapy: WFL for tasks assessed/performed Overall Cognitive Status: Within Functional Limits for tasks assessed                      Exercises      General Comments General comments (skin integrity, edema, etc.): reviewed therex handout and encouraged pt to work on exercises throughout the day; pt agreed and reported he has working on them in bed      Pertinent Vitals/Pain Pain Assessment: No/denies pain    Home Living                      Prior Function            PT Goals (current goals can now be found in the care plan section) Acute Rehab PT Goals Patient Stated Goal: walk more Progress towards PT goals: Progressing toward goals    Frequency  Min 3X/week    PT Plan Current plan remains appropriate    Co-evaluation             End of Session Equipment Utilized During Treatment: Gait belt Activity Tolerance: Patient limited by fatigue Patient left: in chair;with call bell/phone within reach     Time: 1030-1053 PT Time Calculation (min) (ACUTE ONLY): 23 min  Charges:  $Gait Training: 8-22 mins $Therapeutic Activity: 8-22 mins  G Codes:      Derek Mound, PTA Pager: [phone]   01/04/2016, 12:11 PM

## 2016-01-05 LAB — CBC
HCT: 30 % — ABNORMAL LOW (ref 39.0–52.0)
Hemoglobin: 9.7 g/dL — ABNORMAL LOW (ref 13.0–17.0)
MCH: 29.6 pg (ref 26.0–34.0)
MCHC: 32.3 g/dL (ref 30.0–36.0)
MCV: 91.5 fL (ref 78.0–100.0)
Platelets: 359 10*3/uL (ref 150–400)
RBC: 3.28 MIL/uL — ABNORMAL LOW (ref 4.22–5.81)
RDW: 13.6 % (ref 11.5–15.5)
WBC: 10.6 10*3/uL — ABNORMAL HIGH (ref 4.0–10.5)

## 2016-01-05 LAB — BASIC METABOLIC PANEL
Anion gap: 8 (ref 5–15)
BUN: 7 mg/dL (ref 6–20)
CO2: 24 mmol/L (ref 22–32)
Calcium: 7.7 mg/dL — ABNORMAL LOW (ref 8.9–10.3)
Chloride: 105 mmol/L (ref 101–111)
Creatinine, Ser: 0.97 mg/dL (ref 0.61–1.24)
GFR calc Af Amer: >60 mL/min (ref >60)
GFR calc non Af Amer: >60 mL/min (ref >60)
Glucose, Bld: 340 mg/dL — ABNORMAL HIGH (ref 65–99)
Potassium: 3.2 mmol/L — ABNORMAL LOW (ref 3.5–5.1)
Sodium: 137 mmol/L (ref 135–145)

## 2016-01-05 LAB — GLUCOSE, CAPILLARY
Glucose-Capillary: 359 mg/dL — ABNORMAL HIGH (ref 65–99)
Glucose-Capillary: 317 mg/dL — ABNORMAL HIGH (ref 65–99)

## 2016-01-05 MED ORDER — INSULIN ASPART 100 UNIT/ML ~~LOC~~ SOLN: 8.0000 [IU] | Freq: Three times a day (TID) | SUBCUTANEOUS | Status: DC

## 2016-01-05 MED ORDER — INSULIN ASPART 100 UNIT/ML FLEXPEN: PEN_INJECTOR | SUBCUTANEOUS | Status: AC

## 2016-01-05 MED ORDER — INSULIN PEN NEEDLE 31G X 5 MM MISC: Status: AC

## 2016-01-05 MED ORDER — LIVING WELL WITH DIABETES BOOK
Freq: Once | Status: AC
  Administered 2016-01-05: 14:00:00
  Filled 2016-01-05: qty 1

## 2016-01-05 MED ORDER — CLONIDINE HCL 0.2 MG/24HR TD PTWK: 0.2000 mg | MEDICATED_PATCH | TRANSDERMAL | Status: DC

## 2016-01-05 MED ORDER — INSULIN STARTER KIT- PEN NEEDLES (ENGLISH)
1.0000 | Freq: Once | Status: AC
  Administered 2016-01-05: 1
  Filled 2016-01-05: qty 1

## 2016-01-05 MED ORDER — PANTOPRAZOLE SODIUM 40 MG PO TBEC: 40.0000 mg | DELAYED_RELEASE_TABLET | Freq: Every day | ORAL | Status: AC

## 2016-01-05 MED ORDER — INSULIN GLARGINE 100 UNIT/ML ~~LOC~~ SOLN: 40.0000 [IU] | Freq: Every day | SUBCUTANEOUS | Status: DC

## 2016-01-05 MED ORDER — INSULIN GLARGINE 100 UNIT/ML SOLOSTAR PEN: 40.0000 [IU] | PEN_INJECTOR | Freq: Every day | SUBCUTANEOUS | Status: DC

## 2016-01-05 MED ORDER — BLOOD GLUCOSE METER KIT: PACK | Status: AC

## 2016-01-05 MED ORDER — METOPROLOL TARTRATE 75 MG PO TABS: 75.0000 mg | ORAL_TABLET | Freq: Two times a day (BID) | ORAL | Status: DC

## 2016-01-05 MED ORDER — HYDRALAZINE HCL 50 MG PO TABS: 50.0000 mg | ORAL_TABLET | Freq: Four times a day (QID) | ORAL | Status: DC

## 2016-01-05 NOTE — Progress Notes (Addendum)
Nsg Discharge Note  Admit Date:  12/21/2015 Discharge date: 01/05/2016   [name] to be D/C'd Home per MD order.  AVS completed.  Copy for chart, and copy for patient signed, and dated. Patient/caregiver able to verbalize understanding.  Discharge Medication:   Medication List    STOP taking these medications        lisinopril 5 MG tablet  Commonly known as:  PRINIVIL,ZESTRIL      TAKE these medications        blood glucose meter kit and supplies  Dispense based on patient and insurance preference. Use up to four times daily as directed. (FOR ICD-9 250.00, 250.01).     cloNIDine 0.2 mg/24hr patch  Commonly known as:  CATAPRES - Dosed in mg/24 hr  Place 1 patch (0.2 mg total) onto the skin once a week.     hydrALAZINE 50 MG tablet  Commonly known as:  APRESOLINE  Take 1 tablet (50 mg total) by mouth every 6 (six) hours.     insulin aspart 100 UNIT/ML FlexPen  Commonly known as:  NOVOLOG  8 units with meals (as long as eating 50%) PLUS the following scale: CBG 70 - 120: 0 units CBG 121 - 150: 2 units CBG 151 - 200: 3 units CBG 201 - 250: 5 units CBG 251 - 300: 8 units CBG 301 - 350: 11 units CBG 351 - 400: 15 units     Insulin Glargine 100 UNIT/ML Solostar Pen  Commonly known as:  LANTUS SOLOSTAR  Inject 40 Units into the skin daily at 10 pm.     Insulin Pen Needle 31G X 5 MM Misc  For insulin injections     Metoprolol Tartrate 75 MG Tabs  Take 75 mg by mouth 2 (two) times daily.     pantoprazole 40 MG tablet  Commonly known as:  PROTONIX  Take 1 tablet (40 mg total) by mouth at bedtime.        Discharge Assessment: Filed Vitals:   01/05/16 0921 01/05/16 1407  BP: 156/89 129/68  Pulse: 85 78  Temp:  99 F (37.2 C)  Resp:  18   Skin clean, dry and intact without evidence of skin break down, no evidence of skin tears noted. IV catheter discontinued intact. Site without signs and symptoms of complications - no redness or edema noted at insertion site,  patient denies c/o pain - only slight tenderness at site.  Dressing with slight pressure applied.  D/c Instructions-Education: Discharge instructions given to patient/family with verbalized understanding. Patient watched diabetic education videos. Adminstered his insulin dose at lunch. D/c education completed with patient/family including follow up instructions, medication list, d/c activities limitations if indicated, with other d/c instructions as indicated by MD - patient able to verbalize understanding, all questions fully answered. Patient instructed to return to ED, call 911, or call MD for any changes in condition.  Patient escorted via [place], and D/C home via private auto.  Ashely Joshua Margaretha Sheffield, RN 01/05/2016 4:38 PM

## 2016-01-05 NOTE — Progress Notes (Signed)
Patient administered his own insulin dosage at lunch. No problems.

## 2016-01-05 NOTE — Care Management Note (Addendum)
Case Management Note  Patient Details  Name: Timothy CoyQuinten Lovern MRN: [id number] Date of Birth: 12-Jun-1976  Subjective/Objective:        Admitted with necrotizing pancreatitis. Resides with wife. PCP: [facility].           Action/Plan: Plan is to d/c to home with home health services in place today.  Expected Discharge Date:  01/05/2016             Expected Discharge Plan:  Home/Self Care  In-House Referral:     Discharge planning Services  CM Consult  Post Acute Care Choice:    Choice offered to:   patient  DME Arranged:   Rolling walker[facility] @ [phone] DME Agency:     [facility]  [facility]   [facility]   [facility] @ [phone]  Status of Service:  completed  Medicare Important Message Given:    Date Medicare IM Given:    Medicare IM give by:    Date Additional Medicare IM Given:    Additional Medicare Important Message give by:     If discussed at Long Length of Stay Meetings, dates discussed:    Additional Comments:  Epifanio LeschesCole, [place] Derenzo Hudson, [place]RN,BSN,CM [phone] 01/05/2016, 9:57 AM

## 2016-01-05 NOTE — Progress Notes (Signed)
Physical Therapy Treatment Patient Details Name: Timothy [name] MRN: [id number] DOB: 04-17-1976 Today's Date: 01/05/2016    History of Present Illness Pt is a 40 yo male admitted for pancreatitis, sepsis; developed respiratory distress, worsening tachycardia; transferred to ICU intubated 5/25-5/28. PMH: low back surgery in 2007.    PT Comments    Notified by RN that patient is to discharge home today and wants education on stairs (he has one flight up to bedroom, only shower). Patient able to appropriately pace himself and demonstrated excellent balance (entry steps without rail). SaO2 98% on room air. Patient has met all current PT goals. Goals to be updated only if he remains hospitalized. OK for discharge home from PT perspective.     Follow Up Recommendations  Home health PT;Supervision/Assistance - 24 hour     Equipment Recommendations  Rolling walker with 5" wheels    Recommendations for Other Services       Precautions / Restrictions Precautions Precautions: Fall Restrictions Weight Bearing Restrictions: No    Mobility  Bed Mobility                  Transfers Overall transfer level: Independent Equipment used: None Transfers: Sit to/from Stand Sit to Stand: Independent         General transfer comment: stood x 3 various surfaces  Ambulation/Gait Ambulation/Gait assistance: Min guard Ambulation Distance (Feet): 10 Feet (rode to gym; 30; rode to room 12) Assistive device: None Gait Pattern/deviations: Step-through pattern;Decreased stride length;Wide base of support Gait velocity: decreased-appropriate for his level of dyspnea Gait velocity interpretation: Below normal speed for age/gender     Stairs Stairs: Yes Stairs assistance: Min guard Stair Management: No rails;Two rails;Step to pattern;Forwards Number of Stairs: 10 General stair comments: educated on how wife can provide support on 2 steps into home (no rail) and then how to go up/down  with rails (up to bedroom/shower); educated to minimize trips up/down initially to conserve energy (down once/day, up once/day)  Wheelchair Mobility    Modified Rankin (Stroke Patients Only)       Balance     Sitting balance-Leahy Scale: Good     Standing balance support: No upper extremity supported Standing balance-Leahy Scale: Good                      Cognition Arousal/Alertness: Awake/alert Behavior During Therapy: WFL for tasks assessed/performed Overall Cognitive Status: Within Functional Limits for tasks assessed                      Exercises      General Comments General comments (skin integrity, edema, etc.): wife present. Discussed expectations for HHPT. No further questions      Pertinent Vitals/Pain Pain Assessment: No/denies pain    Home Living                      Prior Function            PT Goals (current goals can now be found in the care plan section) Acute Rehab PT Goals Patient Stated Goal: go home Time For Goal Achievement: 01/04/16 (met today) Progress towards PT goals:  (Goals met; anticipate d/c today; if not, will update)    Frequency  Min 3X/week    PT Plan Current plan remains appropriate    Co-evaluation             End of Session   Activity Tolerance: Patient limited by fatigue  Patient left: in chair;with call bell/phone within reach;with family/visitor present     Time: 1158-1229 PT Time Calculation (min) (ACUTE ONLY): 31 min  Charges:  $Gait Training: 23-37 mins                    G Codes:      Analeise Mccleery 01-26-2016, 12:39 PM Pager [phone]

## 2016-01-05 NOTE — Progress Notes (Addendum)
Inpatient Diabetes Program Recommendations  AACE/ADA: New Consensus Statement on Inpatient Glycemic Control (2015)  Target Ranges:  Prepandial:   less than 140 mg/dL      Peak postprandial:   less than 180 mg/dL (1-2 hours)      Critically ill patients:  140 - 180 mg/dL   Patient's wife has DM type 1. Patient has seen her check glucose and give herself insulin. Discussed A1c, diet modification as well as steps toward checking glucose.  Educated patient and spouse on insulin pen use at home. Reviewed contents of insulin flexpen starter kit. Reviewed all steps if insulin pen including attachment of needle, 2-unit air shot, dialing up dose, giving injection, removing needle, disposal of sharps, storage of unused insulin, disposal of insulin etc. Patient able to provide successful return demonstration. Also reviewed troubleshooting with insulin pen.   MD patient will need: Glucose meter kit (order # [id number]) Insulin pen needles (order # [id number]) Lantus Solostar pen Novolog Flexpen  Thanks,  Tama Headings RN, MSN, [facility] Inpatient Diabetes Coordinator Team Pager [phone] (8a-5p)

## 2016-01-05 NOTE — Discharge Summary (Signed)
Physician Discharge Summary  Timothy [name] GYB:[id number] DOB: Feb 24, 1976 DOA: 12/21/2015  PCP: Timothy Nip, MD  Admit date: 12/21/2015 Discharge date: 01/05/2016   Recommendations for Outpatient Follow-Up:   Home health- PT with 24 hour supervision Low salt/carb mod diet Record blood sugars and bring to PCP BMP 1 week with PCP     Discharge Diagnosis:   Principal Problem:   Acute pancreatitis Active Problems:   Hypocalcemia   AKI (acute kidney injury) ([place])   Accelerated hypertension   Leukocytosis   Lactic acidosis   Hyperglycemia   Obesity   Hypokalemia   Acute respiratory failure (HCC)   Pancreatitis, acute   Acute respiratory failure with hypoxia [facility])   Discharge disposition:  Home.  Discharge Condition: Improved.  Diet recommendation: carb mod/low fat  Wound care: None.   History of Present Illness:   Timothy [name] is a 40 y.o. gentleman with a history of HTN, recently started on lisinopril for management, who feels that he was in his baseline state of health until he developed epigastric pain around 5pm, which he attributed to gas. He arrived home from work, had dinner per routine, and got in bed around 8:30pm. Shortly after that, he developed nausea and vomiting x2 (nonbloody) followed by increased intensity in his epigastric pain with radiation to his RUQ and into his back. He had associated diaphoresis. No chest pain, light-headedness, or near syncope. He was short of breath secondary to the pain. No history of prior gallbladder issues. No history of GERD. He drinks beer 2-3 times per week, but no history of binge drinking.  ED Course: The patient presented with markedly elevated blood pressure and there was an initial concern for aortic dissection. Fortunately, CTA of the chest/abdomen/pelvis is negative for dissection, but the patient has marked inflammation of the pancreas, with gallbladder distention. Lipase markedly elevated to  3700. RUQ ultrasound pending. Blood pressure has remained markedly elevated (systolic greater than 876), despite multiple rounds of IV pain medications as well as IV anti-hypertensives. The patient also has multiple electrolyte abnormalities. The patient is being admitted to the stepdown unit, primarily for aggressive blood pressure management.   Hospital Course by Problem:   Acute severe pancreatitis with shock/sepsis, acute respiratory failure complicated by ilieus Repeat CT scan 6/3 => severe pancreatitis, No pseudocyst.advance to low fat diet as tolerated.  Appreciate GI input.Appreciate surgery input as well, F/u outpatient for elective cholecystectomy S/p 2 weeks of inepenem  ABnormal lft improving,  hepatitis panel negative  Hypertension uncontrolled Increase metoprolol to 5m po bid 6/3 increase hydralazine to 567mpo qid Cont clonidine patch.    Anemia of critical illness- outpatient follow up Stable   Hypernatremia resolved.  Acute encephalopathy with acute respiratory failure  Currently axox3, Will limit benzo and sedating medications.    Dm2 Increase lantus- 40 units daily- wife if type 1 and can help SSI novolog  AKI resolved   Protein calorie malnutrition  Hypokalemia Repleted    Medical Consultants:    GI  PCCM  surgery   Discharge Exam:   Filed Vitals:   01/05/16 0447 01/05/16 0921  BP: 140/84 156/89  Pulse: 84 85  Temp: 98.3 F (36.8 C)   Resp:     Filed Vitals:   01/04/16 1517 01/04/16 2147 01/05/16 0447 01/05/16 0921  BP: 157/80 155/74 140/84 156/89  Pulse: 88 96 84 85  Temp: 98.9 F (37.2 C) 98.2 F (36.8 C) 98.3 F (36.8 C)   TempSrc: Oral Oral Oral  Resp: 20 18    Height:      Weight:      SpO2: 98% 97% 93%     Gen:  NAD Cardiovascular:  RRR, No M/R/G Respiratory: Lungs CTAB Gastrointestinal: Abdomen soft, NT/ND with normal active bowel sounds. Extremities: No C/E/C   The results of significant  diagnostics from this hospitalization (including imaging, microbiology, ancillary and laboratory) are listed below for reference.     Procedures and Diagnostic Studies:   Dg Chest Portable 1 View  12/21/2015  CLINICAL DATA:  Substernal pain epigastric pain beginning tonight. EXAM: PORTABLE CHEST 1 VIEW COMPARISON:  None. FINDINGS: Lungs are adequately inflated without consolidation or effusion. Cardiomediastinal silhouette is within normal. Bones and soft tissues are normal. IMPRESSION: No active disease. Electronically Signed   By: Marin Olp M.D.   On: 12/21/2015 23:49   Ct Angio Chest/abd/pel For Dissection W And/or W/wo  12/22/2015  CLINICAL DATA:  Chest pressure and epigastric pain for 1 day. Nausea and vomiting with diaphoresis. Clinical concern for dissection. EXAM: CT ANGIOGRAPHY CHEST, ABDOMEN AND PELVIS TECHNIQUE: Multidetector CT imaging through the chest, abdomen and pelvis was performed using the standard protocol during bolus administration of intravenous contrast. Multiplanar reconstructed images and MIPs were obtained and reviewed to evaluate the vascular anatomy. CONTRAST:  100 cc Isovue 370 IV COMPARISON:  Chest radiograph earlier this day. FINDINGS: CTA CHEST FINDINGS Normal caliber thoracic aorta without aneurysm, dissection, hematoma or acute aortic abnormality. There is a conventional branching pattern from the aortic arch. No significant atherosclerosis. No filling defects in the central pulmonary arteries to suggest pulmonary embolus. Heart is normal in size. Left hilar calcification, sequela of prior granulomatous disease. No pericardial effusion. No adenopathy. Minimal dependent atelectasis in both lower lobes. 3 mm pleural based nodule in the right lower lobe series 5, image 35 is likely a calcified granuloma. 4 mm left upper lobe granuloma, series 5, image 15. No pleural effusion. Mild distal esophageal thickening. Included thyroid gland is normal. There are no acute or  suspicious osseous abnormalities. Mild degenerative disc disease in the thoracic spine. Review of the MIP images confirms the above findings. CTA ABDOMEN AND PELVIS FINDINGS Normal caliber abdominal aorta without dissection or aneurysm. No significant atherosclerosis. Celiac, superior mesenteric, and inferior mesenteric arteries are widely patent. Single bilateral renal arteries are patent, early branching of the left renal artery. Bilateral common and external iliac arteries are normal. Extensive peripancreatic fat stranding extending from the head, through the body and tail. There is adjacent peripancreatic free fluid, no loculated fluid collection. Moderate fluid tracks in the right and left anterior perirenal space into the pericolic gutter. Fluid tracks in the left upper quadrant about the medial spleen. Decreased density of the liver consistent with steatosis. Mild gallbladder distention, no calcified stone. Arterial phase imaging of the spleen and adrenal glands is normal. Symmetric renal enhancement, no hydronephrosis. Stomach distended with ingested contents. Mild bowel wall thickening of small bowel loops in the left upper quadrant, likely reactive secondary to pancreatic inflammation. No small bowel distention. The appendix is normal. Small volume colonic stool. Minimal diverticulosis of sigmoid colon, no diverticulitis. No free air. No evidence of retroperitoneal or mesenteric adenopathy. In the pelvis the bladder is physiologically distended. Prostate gland normal in size. Fat within both inguinal canals. Scattered degenerative change throughout the lumbar spine with degenerative disc disease at L5-S1 and facet arthropathy most prominent at L3-L4. Review of the MIP images confirms the above findings. IMPRESSION: 1. Normal caliber thoracoabdominal aorta without  dissection or acute aortic abnormality. 2. Marked peripancreatic inflammation consistent with pancreatitis. Peripancreatic fluid tracking in the  anterior para renal space and both pericolic gutters. 3. Mild gallbladder distention, no calcified stone. Hepatic steatosis. 4. Sequela of granulomatous disease in the chest. Electronically Signed   By: Jeb Levering M.D.   On: 12/22/2015 01:24   US Abdomen Limited Ruq  12/22/2015  CLINICAL DATA:  Initial evaluation for acute chest pain, epigastric pressure, nausea, vomiting. EXAM: US ABDOMEN LIMITED - RIGHT UPPER QUADRANT COMPARISON:  None. FINDINGS: Gallbladder: Multiple echogenic stones present within the gallbladder lumen, largest of which measured 2.4 cm. Small amount of sludge present as well. Gallbladder wall measured within normal limits at 3 mm. No free pericholecystic fluid. No sonographic Murphy sign elicited on exam. Probable focal fatty sparing noted within the liver adjacent to the gallbladder fossa. Common bile duct: Diameter: 4 mm Liver: Diffusely increased echogenicity, suggestive of steatosis. No focal lesions. IMPRESSION: 1. Cholelithiasis without sonographic evidence for acute cholecystitis or biliary dilatation. 2. Hepatic steatosis. Electronically Signed   By: Jeannine Boga M.D.   On: 12/22/2015 04:04     Labs:   Basic Metabolic Panel:  Recent Labs Lab 01/01/16 0343 01/02/16 0431 01/03/16 0510 01/04/16 0620 01/05/16 0922  NA 141 141 139 138 137  K 3.0* 3.1* 2.8* 2.8* 3.2*  CL 105 105 107 107 105  CO2 '27 29 26 25 24  ' GLUCOSE 276* 187* 186* 242* 340*  BUN '16 11 9 7 7  ' CREATININE 1.11 1.10 1.06 0.98 0.97  CALCIUM 7.5* 7.8* 7.4* 7.4* 7.7*  MG  --   --  1.9  --   --    GFR Estimated Creatinine Clearance: 133.5 mL/min (by C-G formula based on Cr of 0.97). Liver Function Tests:  Recent Labs Lab 01/02/16 0431 01/03/16 0510 01/04/16 0620  AST 481* 64* 31  ALT 404* 182* 100*  ALKPHOS 136* 102 84  BILITOT 2.2* 0.9 1.0  PROT 5.5* 5.0* 5.0*  ALBUMIN 2.0* 1.8* 1.9*    Recent Labs Lab 12/30/15 0350 01/03/16 0510  LIPASE 16 17   No results for  input(s): AMMONIA in the last 168 hours. Coagulation profile No results for input(s): INR, PROTIME in the last 168 hours.  CBC:  Recent Labs Lab 01/01/16 0343 01/02/16 0431 01/03/16 0510 01/04/16 0620 01/05/16 0922  WBC 15.2* 11.5* 10.9* 11.4* 10.6*  NEUTROABS  --   --  8.9*  --   --   HGB 10.2* 10.5* 9.7* 10.0* 9.7*  HCT 32.6* 33.1* 30.8* 30.9* 30.0*  MCV 93.9 93.0 91.9 91.7 91.5  PLT 253 322 325 355 359   Cardiac Enzymes: No results for input(s): CKTOTAL, CKMB, CKMBINDEX, TROPONINI in the last 168 hours. BNP: Invalid input(s): POCBNP CBG:  Recent Labs Lab 01/04/16 1200 01/04/16 1701 01/04/16 2209 01/05/16 0807 01/05/16 1156  GLUCAP 293* 300* 263* 359* 317*   D-Dimer No results for input(s): DDIMER in the last 72 hours. Hgb A1c No results for input(s): HGBA1C in the last 72 hours. Lipid Profile No results for input(s): CHOL, HDL, LDLCALC, TRIG, CHOLHDL, LDLDIRECT in the last 72 hours. Thyroid function studies No results for input(s): TSH, T4TOTAL, T3FREE, THYROIDAB in the last 72 hours.  Invalid input(s): FREET3 Anemia work up No results for input(s): VITAMINB12, FOLATE, FERRITIN, TIBC, IRON, RETICCTPCT in the last 72 hours. Microbiology Recent Results (from the past 240 hour(s))  C difficile quick scan w PCR reflex     Status: None   Collection Time:  12/31/15  1:52 PM  Result Value Ref Range Status   C Diff antigen NEGATIVE NEGATIVE Final   C Diff toxin NEGATIVE NEGATIVE Final   C Diff interpretation Negative for toxigenic C. difficile  Final     Discharge Instructions:       Discharge Instructions    Call MD for:  persistant nausea and vomiting    Complete by:  As directed      Call MD for:  severe uncontrolled pain    Complete by:  As directed      Call MD for:  temperature >100.4    Complete by:  As directed      Discharge instructions    Complete by:  As directed   Home health- PT with 24 hour supervision Low salt/carb mod diet Record  blood sugars and bring to PCP BMP 1 week with PCP     Increase activity slowly    Complete by:  As directed             Medication List    STOP taking these medications        lisinopril 5 MG tablet  Commonly known as:  PRINIVIL,ZESTRIL      TAKE these medications        blood glucose meter kit and supplies  Dispense based on patient and insurance preference. Use up to four times daily as directed. (FOR ICD-9 250.00, 250.01).     cloNIDine 0.2 mg/24hr patch  Commonly known as:  CATAPRES - Dosed in mg/24 hr  Place 1 patch (0.2 mg total) onto the skin once a week.     hydrALAZINE 50 MG tablet  Commonly known as:  APRESOLINE  Take 1 tablet (50 mg total) by mouth every 6 (six) hours.     insulin aspart 100 UNIT/ML FlexPen  Commonly known as:  NOVOLOG  8 units with meals (as long as eating 50%) PLUS the following scale: CBG 70 - 120: 0 units CBG 121 - 150: 2 units CBG 151 - 200: 3 units CBG 201 - 250: 5 units CBG 251 - 300: 8 units CBG 301 - 350: 11 units CBG 351 - 400: 15 units     Insulin Glargine 100 UNIT/ML Solostar Pen  Commonly known as:  LANTUS SOLOSTAR  Inject 40 Units into the skin daily at 10 pm.     Insulin Pen Needle 31G X 5 MM Misc  For insulin injections     Metoprolol Tartrate 75 MG Tabs  Take 75 mg by mouth 2 (two) times daily.     pantoprazole 40 MG tablet  Commonly known as:  PROTONIX  Take 1 tablet (40 mg total) by mouth at bedtime.       Follow-up Information    Follow up with [facility]. Schedule an appointment as soon as possible for a visit in 2 weeks.   Specialty:  General Surgery   Why:  to discuss gallbladder surgery   Contact information:   [address] [place] [address] [phone]       Follow up with Timothy Nip, MD In 1 week.   Specialty:  Family Medicine   Contact information:   [place] [address] [phone]       Follow up with Lear Ng., MD.    Specialty:  Gastroenterology   Why:  in July- will call with appointment   Contact information:   [address]  [place] [address] [phone]        Time coordinating discharge: 35 min  Signed:  Kenzo Ozment U Myeshia Fojtik   [facility] 01/05/2016, 1:25 PM

## 2016-01-05 NOTE — Progress Notes (Signed)
Patient ID: Timothy [name], male   DOB: Mar 17, 1976, 40 y.o.   MRN: [id number] [facility] Progress Note  Timothy [name] 40 y.o. Mar 17, 1976   Subjective: Lying in chair. Feels fine. Ambulated yesterday. Denies abdominal pain. Tolerating low fat diet. Wife at bedside. Nurse in room.  Objective: Vital signs in last 24 hours: Filed Vitals:   01/05/16 0447 01/05/16 0921  BP: 140/84 156/89  Pulse: 84 85  Temp: 98.3 F (36.8 C)   Resp:      Physical Exam: Gen: alert, no acute distress, morbidly obese HEENT: anicteric sclera CV: RRR Chest: CTA B Abd: minimal epigastric and RUQ tenderness with guarding, soft, nondistended, +BS  Lab Results:  Recent Labs  01/03/16 0510 01/04/16 0620  NA 139 138  K 2.8* 2.8*  CL 107 107  CO2 26 25  GLUCOSE 186* 242*  BUN 9 7  CREATININE 1.06 0.98  CALCIUM 7.4* 7.4*  MG 1.9  --     Recent Labs  01/03/16 0510 01/04/16 0620  AST 64* 31  ALT 182* 100*  ALKPHOS 102 84  BILITOT 0.9 1.0  PROT 5.0* 5.0*  ALBUMIN 1.8* 1.9*    Recent Labs  01/03/16 0510 01/04/16 0620  WBC 10.9* 11.4*  NEUTROABS 8.9*  --   HGB 9.7* 10.0*  HCT 30.8* 30.9*  MCV 91.9 91.7  PLT 325 355   No results for input(s): LABPROT, INR in the last 72 hours.    Assessment/Plan: Resolving necrotizing pancreatitis - clinically doing well and tolerating low fat diet without abdominal pain. S/P almost 2 weeks of Imipenem and no additional antibiotics needed at this time. From a pancreatitis standpoint stable to go home today. Wife reports PT evaluation in process to see about his ability to climb stairs. Would continue low fat diet indefinitely. Will set up a f/u CT for 6-8 weeks but will decide on exact timing when I see him in the office in July (patient requests follow up in the office with me; office will call and arrange). Needs to have a [facility] appt this summer to decide on GB surgery due to gallstones seen on U/S. Defer d/c timing to Dr. Benjamine MolaVann. Will sign  off. Call if questions.   Beauty Pless C. 01/05/2016, 9:30 AM  Pager [phone]  If no answer or after 5 PM call [phone]

## 2016-01-06 DIAGNOSIS — Z9181 History of falling: Secondary | ICD-10-CM | POA: Diagnosis not present

## 2016-01-06 DIAGNOSIS — E1165 Type 2 diabetes mellitus with hyperglycemia: Secondary | ICD-10-CM | POA: Diagnosis not present

## 2016-01-06 DIAGNOSIS — I10 Essential (primary) hypertension: Secondary | ICD-10-CM | POA: Diagnosis not present

## 2016-01-06 DIAGNOSIS — T888 Other specified complications of surgical and medical care, not elsewhere classified: Secondary | ICD-10-CM | POA: Diagnosis not present

## 2016-01-06 DIAGNOSIS — Z794 Long term (current) use of insulin: Secondary | ICD-10-CM | POA: Diagnosis not present

## 2016-01-06 DIAGNOSIS — D638 Anemia in other chronic diseases classified elsewhere: Secondary | ICD-10-CM | POA: Diagnosis not present

## 2016-01-06 DIAGNOSIS — E46 Unspecified protein-calorie malnutrition: Secondary | ICD-10-CM | POA: Diagnosis not present

## 2016-01-06 DIAGNOSIS — K8592 Acute pancreatitis with infected necrosis, unspecified: Secondary | ICD-10-CM | POA: Diagnosis not present

## 2016-01-08 DIAGNOSIS — T888 Other specified complications of surgical and medical care, not elsewhere classified: Secondary | ICD-10-CM | POA: Diagnosis not present

## 2016-01-08 DIAGNOSIS — E46 Unspecified protein-calorie malnutrition: Secondary | ICD-10-CM | POA: Diagnosis not present

## 2016-01-08 DIAGNOSIS — K8592 Acute pancreatitis with infected necrosis, unspecified: Secondary | ICD-10-CM | POA: Diagnosis not present

## 2016-01-08 DIAGNOSIS — I10 Essential (primary) hypertension: Secondary | ICD-10-CM | POA: Diagnosis not present

## 2016-01-08 DIAGNOSIS — Z794 Long term (current) use of insulin: Secondary | ICD-10-CM | POA: Diagnosis not present

## 2016-01-08 DIAGNOSIS — D638 Anemia in other chronic diseases classified elsewhere: Secondary | ICD-10-CM | POA: Diagnosis not present

## 2016-01-08 DIAGNOSIS — E1165 Type 2 diabetes mellitus with hyperglycemia: Secondary | ICD-10-CM | POA: Diagnosis not present

## 2016-01-08 DIAGNOSIS — Z9181 History of falling: Secondary | ICD-10-CM | POA: Diagnosis not present

## 2016-01-12 DIAGNOSIS — K8591 Acute pancreatitis with uninfected necrosis, unspecified: Secondary | ICD-10-CM | POA: Diagnosis not present

## 2016-01-12 DIAGNOSIS — E1165 Type 2 diabetes mellitus with hyperglycemia: Secondary | ICD-10-CM | POA: Diagnosis not present

## 2016-01-12 DIAGNOSIS — R601 Generalized edema: Secondary | ICD-10-CM | POA: Diagnosis not present

## 2016-01-12 DIAGNOSIS — E8809 Other disorders of plasma-protein metabolism, not elsewhere classified: Secondary | ICD-10-CM | POA: Diagnosis not present

## 2016-01-12 DIAGNOSIS — E46 Unspecified protein-calorie malnutrition: Secondary | ICD-10-CM | POA: Diagnosis not present

## 2016-01-12 DIAGNOSIS — Z794 Long term (current) use of insulin: Secondary | ICD-10-CM | POA: Diagnosis not present

## 2016-01-12 DIAGNOSIS — D638 Anemia in other chronic diseases classified elsewhere: Secondary | ICD-10-CM | POA: Diagnosis not present

## 2016-01-12 DIAGNOSIS — I10 Essential (primary) hypertension: Secondary | ICD-10-CM | POA: Diagnosis not present

## 2016-01-12 DIAGNOSIS — K8592 Acute pancreatitis with infected necrosis, unspecified: Secondary | ICD-10-CM | POA: Diagnosis not present

## 2016-01-12 DIAGNOSIS — T07 Unspecified multiple injuries: Secondary | ICD-10-CM | POA: Diagnosis not present

## 2016-01-12 DIAGNOSIS — Z9181 History of falling: Secondary | ICD-10-CM | POA: Diagnosis not present

## 2016-01-13 ENCOUNTER — Encounter (HOSPITAL_COMMUNITY): Payer: Self-pay | Admitting: *Deleted

## 2016-01-13 ENCOUNTER — Emergency Department (HOSPITAL_COMMUNITY): Payer: Federal, State, Local not specified - PPO

## 2016-01-13 DIAGNOSIS — E119 Type 2 diabetes mellitus without complications: Secondary | ICD-10-CM | POA: Diagnosis not present

## 2016-01-13 DIAGNOSIS — I10 Essential (primary) hypertension: Secondary | ICD-10-CM | POA: Insufficient documentation

## 2016-01-13 DIAGNOSIS — R05 Cough: Secondary | ICD-10-CM | POA: Diagnosis not present

## 2016-01-13 DIAGNOSIS — Z79899 Other long term (current) drug therapy: Secondary | ICD-10-CM | POA: Diagnosis not present

## 2016-01-13 DIAGNOSIS — Z794 Long term (current) use of insulin: Secondary | ICD-10-CM | POA: Insufficient documentation

## 2016-01-13 DIAGNOSIS — R509 Fever, unspecified: Secondary | ICD-10-CM | POA: Diagnosis not present

## 2016-01-13 DIAGNOSIS — R109 Unspecified abdominal pain: Secondary | ICD-10-CM | POA: Insufficient documentation

## 2016-01-13 DIAGNOSIS — F411 Generalized anxiety disorder: Secondary | ICD-10-CM | POA: Diagnosis not present

## 2016-01-13 LAB — COMPREHENSIVE METABOLIC PANEL
ALT: 38 U/L (ref 17–63)
AST: 24 U/L (ref 15–41)
Albumin: 2.7 g/dL — ABNORMAL LOW (ref 3.5–5.0)
Alkaline Phosphatase: 93 U/L (ref 38–126)
Anion gap: 9 (ref 5–15)
BUN: 11 mg/dL (ref 6–20)
CO2: 25 mmol/L (ref 22–32)
Calcium: 8.6 mg/dL — ABNORMAL LOW (ref 8.9–10.3)
Chloride: 103 mmol/L (ref 101–111)
Creatinine, Ser: 1.03 mg/dL (ref 0.61–1.24)
GFR calc Af Amer: >60 mL/min (ref >60)
GFR calc non Af Amer: >60 mL/min (ref >60)
Glucose, Bld: 179 mg/dL — ABNORMAL HIGH (ref 65–99)
Potassium: 4 mmol/L (ref 3.5–5.1)
Sodium: 137 mmol/L (ref 135–145)
Total Bilirubin: 0.9 mg/dL (ref 0.3–1.2)
Total Protein: 6.2 g/dL — ABNORMAL LOW (ref 6.5–8.1)

## 2016-01-13 LAB — CBC
HCT: 31.5 % — ABNORMAL LOW (ref 39.0–52.0)
Hemoglobin: 9.8 g/dL — ABNORMAL LOW (ref 13.0–17.0)
MCH: 29.3 pg (ref 26.0–34.0)
MCHC: 31.1 g/dL (ref 30.0–36.0)
MCV: 94.3 fL (ref 78.0–100.0)
Platelets: 311 10*3/uL (ref 150–400)
RBC: 3.34 MIL/uL — ABNORMAL LOW (ref 4.22–5.81)
RDW: 13.7 % (ref 11.5–15.5)
WBC: 12.2 10*3/uL — ABNORMAL HIGH (ref 4.0–10.5)

## 2016-01-13 LAB — LIPASE, BLOOD: Lipase: 18 U/L (ref 11–51)

## 2016-01-13 NOTE — ED Notes (Signed)
The pt is c/o abd an elevated temp today with a cough.  He was recently admitted for pancreatitis from gallstones.  His abd feel bloated  No pain

## 2016-01-14 ENCOUNTER — Ambulatory Visit: Payer: Federal, State, Local not specified - PPO | Admitting: Cardiology

## 2016-01-14 ENCOUNTER — Emergency Department (HOSPITAL_COMMUNITY)
Admission: EM | Admit: 2016-01-14 | Discharge: 2016-01-14 | Disposition: A | Discharge status: Home / Self Care | Payer: Federal, State, Local not specified - PPO | Attending: Emergency Medicine | Admitting: Emergency Medicine

## 2016-01-14 DIAGNOSIS — R509 Fever, unspecified: Secondary | ICD-10-CM

## 2016-01-14 DIAGNOSIS — R059 Cough, unspecified: Secondary | ICD-10-CM

## 2016-01-14 DIAGNOSIS — R05 Cough: Secondary | ICD-10-CM

## 2016-01-14 LAB — I-STAT CG4 LACTIC ACID, ED: Lactic Acid, Venous: 1.23 mmol/L (ref 0.5–2.0)

## 2016-01-14 MED ORDER — ACETAMINOPHEN 325 MG PO TABS
650.0000 mg | ORAL_TABLET | Freq: Once | ORAL | Status: AC
  Administered 2016-01-14: 650 mg via ORAL
  Filled 2016-01-14: qty 2

## 2016-01-14 MED ORDER — IBUPROFEN 800 MG PO TABS
800.0000 mg | ORAL_TABLET | Freq: Once | ORAL | Status: AC
  Administered 2016-01-14: 800 mg via ORAL
  Filled 2016-01-14: qty 1

## 2016-01-14 MED ORDER — LEVOFLOXACIN 750 MG PO TABS
750.0000 mg | ORAL_TABLET | Freq: Once | ORAL | Status: AC
  Administered 2016-01-14: 750 mg via ORAL
  Filled 2016-01-14: qty 1

## 2016-01-14 MED ORDER — LEVOFLOXACIN 750 MG PO TABS: 750.0000 mg | ORAL_TABLET | Freq: Every day | ORAL | Status: DC

## 2016-01-14 MED ORDER — ALBUTEROL SULFATE HFA 108 (90 BASE) MCG/ACT IN AERS: 1.0000 | INHALATION_SPRAY | Freq: Four times a day (QID) | RESPIRATORY_TRACT | Status: AC | PRN

## 2016-01-14 MED ORDER — ALBUTEROL SULFATE (2.5 MG/3ML) 0.083% IN NEBU
5.0000 mg | INHALATION_SOLUTION | Freq: Once | RESPIRATORY_TRACT | Status: AC
  Administered 2016-01-14: 5 mg via RESPIRATORY_TRACT
  Filled 2016-01-14: qty 6

## 2016-01-14 MED ORDER — IPRATROPIUM-ALBUTEROL 0.5-2.5 (3) MG/3ML IN SOLN
3.0000 mL | Freq: Once | RESPIRATORY_TRACT | Status: AC
  Administered 2016-01-14: 3 mL via RESPIRATORY_TRACT
  Filled 2016-01-14: qty 3

## 2016-01-14 NOTE — ED Notes (Signed)
EDP at bedside  

## 2016-01-14 NOTE — ED Provider Notes (Signed)
CSN: [id number]     Arrival date & time 01/13/16  2135 History  By signing my name below, I, Timothy [name], attest that this documentation has been prepared under the direction and in the presence of Timothy Fraise, MD.  Electronically Signed: Julien [name], ED Scribe. 01/14/2016. 2:49 AM.    Chief Complaint  Patient presents with  . Abdominal Pain      Patient is a 40 y.o. male presenting with abdominal pain. The history is provided by the patient. No language interpreter was used.  Abdominal Pain Pain location:  Generalized Pain quality: bloating and fullness   Pain radiates to:  Does not radiate Pain severity:  No pain Onset quality:  Sudden Duration:  1 day Timing:  Constant Progression:  Unchanged Chronicity:  New Context: eating and previous surgery   Context: not recent travel   Relieved by:  Nothing Worsened by:  Eating Ineffective treatments:  None tried Associated symptoms: cough and fever   Associated symptoms: no chest pain, no constipation, no dysuria, no nausea, no shortness of breath and no vomiting    HPI Comments: Timothy [name] is a 41 y.o. male who has a PMHx of HTN, pancreatitis, presents to the Emergency Department complaining of sudden onset, gradual worsening, moderate, abdominal distension which he describes as "over-full" onset today. He notes associated decreased appetite, fever and cough that has been ongoing since he has been in the hospital. Pt states he spent 16 days hospitalized for acute pancreatitis due to gallstones and was released last week. Wife notes pt was also intubated partially during that time. He denies nausea, headache, chest pain, shortness of breath, leg swelling out of baseline, difficulty urinating, and constipation. Pt is a non-smoker.  Past Medical History  Diagnosis Date  . Hypertension   . Pancreatitis, acute 12/22/2015  . AKI (acute kidney injury) ([place]) 12/22/2015  . Anxiety   . Diabetes mellitus without complication [facility])     Past Surgical History  Procedure Laterality Date  . Back surgery     No family history on file. Social History  Substance Use Topics  . Smoking status: Never Smoker   . Smokeless tobacco: Never Used  . Alcohol Use: Yes     Comment: 1 beer per night    Review of Systems  Constitutional: Positive for fever.  Respiratory: Positive for cough. Negative for shortness of breath.   Cardiovascular: Negative for chest pain.  Gastrointestinal: Positive for abdominal pain. Negative for nausea, vomiting and constipation.  Genitourinary: Negative for dysuria.  All other systems reviewed and are negative.     Allergies  Review of patient's allergies indicates no known allergies.  Home Medications   Prior to Admission medications   Medication Sig Start Date End Date Taking? Authorizing Provider  blood glucose meter kit and supplies Dispense based on patient and insurance preference. Use up to four times daily as directed. (FOR ICD-9 250.00, 250.01). 01/05/16   Geradine Girt, DO  cloNIDine (CATAPRES - DOSED IN MG/24 HR) 0.2 mg/24hr patch Place 1 patch (0.2 mg total) onto the skin once a week. 01/05/16   Geradine Girt, DO  hydrALAZINE (APRESOLINE) 50 MG tablet Take 1 tablet (50 mg total) by mouth every 6 (six) hours. 01/05/16   Geradine Girt, DO  insulin aspart (NOVOLOG) 100 UNIT/ML FlexPen 8 units with meals (as long as eating 50%) PLUS the following scale: CBG 70 - 120: 0 units CBG 121 - 150: 2 units CBG 151 - 200: 3 units CBG  201 - 250: 5 units CBG 251 - 300: 8 units CBG 301 - 350: 11 units CBG 351 - 400: 15 units 01/05/16   Geradine Girt, DO  Insulin Glargine (LANTUS SOLOSTAR) 100 UNIT/ML Solostar Pen Inject 40 Units into the skin daily at 10 pm. 01/05/16   Geradine Girt, DO  Insulin Pen Needle 31G X 5 MM MISC For insulin injections 01/05/16   Geradine Girt, DO  Metoprolol Tartrate 75 MG TABS Take 75 mg by mouth 2 (two) times daily. 01/05/16   Geradine Girt, DO  pantoprazole (PROTONIX) 40  MG tablet Take 1 tablet (40 mg total) by mouth at bedtime. 01/05/16   Jessica U Vann, DO   BP 165/95 mmHg  Pulse 117  Temp(Src) 100.4 F (38 C) (Oral)  Resp 20  Wt 286 lb 3.2 oz (129.819 kg)  SpO2 97% Physical Exam CONSTITUTIONAL: Well developed/well nourished HEAD: Normocephalic/atraumatic EYES: EOMI/PERRL ENMT: Mucous membranes moist NECK: supple no meningeal signs SPINE/BACK:entire spine nontender CV: S1/S2 noted, no murmurs/rubs/gallops noted LUNGS: crackles bilaterally, cough frequently throughout exam ABDOMEN: soft, nontender, no rebound or guarding, bowel sounds noted throughout abdomen GU:no cva tenderness NEURO: Pt is awake/alert/appropriate, moves all extremitiesx4.  No facial droop.   EXTREMITIES: pulses normal/equal, full ROM, peripheral edema noted SKIN: warm, color normal PSYCH: no abnormalities of mood noted, alert and oriented to situation  ED Course  Procedures  Medications  ipratropium-albuterol (DUONEB) 0.5-2.5 (3) MG/3ML nebulizer solution 3 mL (3 mLs Nebulization Given 01/14/16 0333)  ibuprofen (ADVIL,MOTRIN) tablet 800 mg (800 mg Oral Given 01/14/16 0333)  levofloxacin (LEVAQUIN) tablet 750 mg (750 mg Oral Given 01/14/16 0333)  acetaminophen (TYLENOL) tablet 650 mg (650 mg Oral Given 01/14/16 0416)  albuterol (PROVENTIL) (2.5 MG/3ML) 0.083% nebulizer solution 5 mg (5 mg Nebulization Given 01/14/16 0416)    DIAGNOSTIC STUDIES: Oxygen Saturation is 97% on RA, normal by my interpretation.  COORDINATION OF CARE:  2:46 AM Discussed treatment plan which includes breathing treatment and ibuprofen with pt at bedside and pt agreed to plan.   Pt well appearing He had no focal abdominal tenderness and lipase normalized - I doubt acute abdominal emergency He did have cough/fever and coarse BS noted He improved with albuterol Although CXR did not reveal signs of pneumonia, since he was recently inpatient/intubated I felt it was important to start antibiotics He  tolerated levaquin He is not septic appearing Lactate negative Will d/c home We discussed return precautions BP 121/70 mmHg  Pulse 107  Temp(Src) 101.2 F (38.4 C) (Oral)  Resp 16  Wt 129.819 kg  SpO2 94%   Labs Review Labs Reviewed  COMPREHENSIVE METABOLIC PANEL - Abnormal; Notable for the following:    Glucose, Bld 179 (*)    Calcium 8.6 (*)    Total Protein 6.2 (*)    Albumin 2.7 (*)    All other components within normal limits  CBC - Abnormal; Notable for the following:    WBC 12.2 (*)    RBC 3.34 (*)    Hemoglobin 9.8 (*)    HCT 31.5 (*)    All other components within normal limits  LIPASE, BLOOD  I-STAT CG4 LACTIC ACID, ED    Imaging Review Dg Chest 2 View  01/13/2016  CLINICAL DATA:  The pt is c/o abd an elevated temp today with a cough. He was recently admitted for pancreatitis from gallstones. His abd feel bloated No pain EXAM: CHEST - 2 VIEW COMPARISON:  12/29/2015 FINDINGS: Patient has been extubated,  the central line and nasogastric tube removed. Linear right perihilar scarring or atelectasis persists. Left lung remains clear.  Heart size normal.  No pneumothorax. No effusion. Visualized bones unremarkable. IMPRESSION: 1. Persistent linear scarring or atelectasis at the right hilum. No acute findings. Electronically Signed   By: Lucrezia Europe M.D.   On: 01/13/2016 23:00   I have personally reviewed and evaluated these images and lab results as part of my medical decision-making.    MDM   Final diagnoses:  Acute febrile illness  Cough   Nursing notes including past medical history and social history reviewed and considered in documentation Previous records reviewed and considered xrays/imaging reviewed by myself and considered during evaluation Labs/vital reviewed myself and considered during evaluation   I personally performed the services described in this documentation, which was scribed in my presence. The recorded information has been reviewed and is  accurate.     Timothy Fraise, MD 01/14/16 [phone]

## 2016-01-18 DIAGNOSIS — F411 Generalized anxiety disorder: Secondary | ICD-10-CM | POA: Diagnosis not present

## 2016-01-19 ENCOUNTER — Encounter: Payer: Self-pay | Admitting: Physician Assistant

## 2016-01-19 DIAGNOSIS — K8592 Acute pancreatitis with infected necrosis, unspecified: Secondary | ICD-10-CM | POA: Diagnosis not present

## 2016-01-19 DIAGNOSIS — D638 Anemia in other chronic diseases classified elsewhere: Secondary | ICD-10-CM | POA: Diagnosis not present

## 2016-01-19 DIAGNOSIS — Z9181 History of falling: Secondary | ICD-10-CM | POA: Diagnosis not present

## 2016-01-19 DIAGNOSIS — Z794 Long term (current) use of insulin: Secondary | ICD-10-CM | POA: Diagnosis not present

## 2016-01-19 DIAGNOSIS — E1165 Type 2 diabetes mellitus with hyperglycemia: Secondary | ICD-10-CM | POA: Diagnosis not present

## 2016-01-19 DIAGNOSIS — T888 Other specified complications of surgical and medical care, not elsewhere classified: Secondary | ICD-10-CM | POA: Diagnosis not present

## 2016-01-19 DIAGNOSIS — I10 Essential (primary) hypertension: Secondary | ICD-10-CM | POA: Diagnosis not present

## 2016-01-19 DIAGNOSIS — E46 Unspecified protein-calorie malnutrition: Secondary | ICD-10-CM | POA: Diagnosis not present

## 2016-01-19 NOTE — Progress Notes (Addendum)
Cardiology Office Note    Date:  01/21/2016  ID:  Timothy [name], DOB [birth year], MRN [id number] PCP:  Timothy Nip, MD  Cardiologist:  Timothy [name], seen by Dr. Cristopher [name] today   Chief Complaint: assist with hypertension management  History of Present Illness:  Timothy [name] is a 40 y.o. male with history of HTN, obesity, recently developed IDDM in the setting of severe necrotizing pancreatitis who presents for Timothy [name]-patient evaluation of difficult-to-control HTN. He was admitted 5/22-01/05/16 with acute severe pancreatitis with shock/sepsis, complicated by acute respiratory failure, transaminitis, anemia of critical illness, acute encephalopathy, AKI (Cr 1.36), protein-calorie malnutrition with severe hypoalbuminemia, hypernatremia & hypokalemia. Prior to that admission, he only had mild hypertension requiring small dose of lisinopril. He had presented with markedly elevated blood pressure. CT chest/abd/pelvis was negative for dissection. Dr. Paulita [name] (GI) raised question of catecholamine effect being a factor in elevated blood pressure. Troponins were negative. Hgb fell into the 9 range (improving to 10.6 on 6/15, previously 14-17), albumin nadir 1.9 (improving to 3.1 on 6/15). He saw his PCP on 01/12/16 at which time he was reporting that his weight was up 18lbs and he had generalized edema. Dr. Radene [name] spoke with Dr. Paulita [name] who felt his edematous condition was likely from low albumin and high IV fluid use as treatment for pancreatitis. It was felt this would improve with gradual mobilization. He was discharged on metoprolol 11m BID, clonidine patch, and hydralazine 558mQID. He has been finding it difficult to take his hydralazine QID. He is referred today to discuss BP regimen.   F/u labs by PCP 01/14/16: Hgb 10.6, Hct 32.6, plt 442, Na 141, K 3.9, glucose 95, BUN 11, Cr 0.93, Tprot 6.3. His PCP started Lasix 2086maily 8 days ago and the patient has subsequently lost 23lbs of fluid. He still has  significant LEE but it is improving slowly. He has had intermittent orthopnea since discharge, but as of last night it is no longer present. He has not had any chest pain, palpitations, or syncope. No known family history of CAD.   Past Medical History  Diagnosis Date  . Essential hypertension   . Pancreatitis, acute 12/22/2015    a. 11/2015 - acute severe pancreatitis with shock/sepsis, complicated by acute respiratory failure, transaminitis, anemia of critical illness, acute encephalopathy, AKI (Cr 1.36), protein-calorie malnutrition with severe hypoalbuminemia, & hypokalemia.  . [place] Kitchen[place]  . Anxiety   . Diabetes mellitus ([facility] . Obesity     Past Surgical History  Procedure Laterality Date  . Back surgery      Current Medications: Current Outpatient Prescriptions  Medication Sig Dispense Refill  . acetaminophen (TYLENOL) 325 MG tablet Take 650 mg by mouth every 6 (six) hours as needed for fever.    . aMarland Kitchenbuterol (PROVENTIL HFA;VENTOLIN HFA) 108 (90 Base) MCG/ACT inhaler Inhale 1-2 puffs into the lungs every 6 (six) hours as needed for wheezing or shortness of breath. 1 Inhaler 0  . blood glucose meter kit and supplies Dispense based on patient and insurance preference. Use up to four times daily as directed. (FOR ICD-9 250.00, 250.01). 1 each 0  . cloNIDine (CATAPRES - DOSED IN MG/24 HR) 0.2 mg/24hr patch Place 1 patch (0.2 mg total) onto the skin once a week. 4 patch 12  . furosemide (LASIX) 20 MG tablet Take 20 mg by mouth daily. Reported on 01/21/2016    . hydrALAZINE (APRESOLINE) 50 MG tablet Take 1 tablet (50 mg total) by  mouth every 6 (six) hours. 120 tablet 0  . insulin aspart (NOVOLOG) 100 UNIT/ML FlexPen 8 units with meals (as long as eating 50%) PLUS the following scale: CBG 70 - 120: 0 units CBG 121 - 150: 2 units CBG 151 - 200: 3 units CBG 201 - 250: 5 units CBG 251 - 300: 8 units CBG 301 - 350: 11 units CBG 351 - 400: 15 units 60 mL 11    . Insulin Glargine (LANTUS SOLOSTAR) 100 UNIT/ML Solostar Pen Inject 40 Units into the skin daily at 10 pm. 15 mL 11  . Insulin Pen Needle 31G X 5 MM MISC For insulin injections 100 each 1  . Metoprolol Tartrate 75 MG TABS Take 75 mg by mouth 2 (two) times daily. 60 tablet 0  . pantoprazole (PROTONIX) 40 MG tablet Take 1 tablet (40 mg total) by mouth at bedtime. 30 tablet 0   No current facility-administered medications for this visit.     Allergies:   Review of patient's allergies indicates no known allergies.   Social History   Social History  . Marital Status: Married    Spouse Name: N/A  . Number of Children: N/A  . Years of Education: N/A   Occupational History  .      Works in administration at [employer]   Social History Main Topics  . Smoking status: Never Smoker   . Smokeless tobacco: Never Used  . Alcohol Use: 0.0 oz/week    0 Standard drinks or equivalent per week     Comment: 1 beer per night previously - now rare use  . Drug Use: No  . Sexual Activity: Not Asked   Other Topics Concern  . None   Social History Narrative     Family History:  The patient's family history includes Hypertension in his father; Schizophrenia in his father. There is no history of Heart attack or Stroke.   ROS:   Please see the history of present illness. All other systems are reviewed and otherwise negative.    PHYSICAL EXAM:   VS:  BP 120/60 mmHg  Pulse 86  Ht '5\' 11"'  (1.803 m)  Wt 276 lb (125.193 kg)  BMI 38.51 kg/m2  BMI: Body mass index is 38.51 kg/(m^2). GEN: Well nourished, well developed obese WM, in no acute distress HEENT: normocephalic, atraumatic Neck: no JVD, carotid bruits, or masses Cardiac: RRR; no murmurs, rubs, or gallops, 1+ puffy pale LE edema  Respiratory:  clear to auscultation bilaterally, normal work of breathing GI: soft, nontender, nondistended, + BS MS: no deformity or atrophy Skin: warm and dry, no rash Neuro:  Alert and Oriented x  3, Strength and sensation are intact, follows commands Psych: euthymic mood, full affect  Wt Readings from Last 3 Encounters:  01/21/16 276 lb (125.193 kg)  01/13/16 286 lb 3.2 oz (129.819 kg)  12/31/15 270 lb (122.471 kg)      Studies/Labs Reviewed:   EKG:  EKG was ordered today and personally reviewed by me and demonstrates NSR 86bpm, nonspecific ST-T changes. Notching in QRS complex not clinically significant at this time per Dr. Lovena Le  Recent Labs: 12/24/2015: B Natriuretic Peptide 151.9* 01/03/2016: Magnesium 1.9 01/13/2016: ALT 38; BUN 11; Creatinine, Ser 1.03; Hemoglobin 9.8*; Platelets 311; Potassium 4.0; Sodium 137   Lipid Panel    Component Value Date/Time   CHOL 187 12/22/2015 0500   TRIG 63 12/22/2015 0500   HDL 41 12/22/2015 0500   CHOLHDL 4.6 12/22/2015 0500   VLDL  13 12/22/2015 0500   LDLCALC 133* 12/22/2015 0500    Additional studies/ records that were reviewed today include: Summarized above.    ASSESSMENT & PLAN:   The patient was seen and examined by myself and Dr. Lovena Le as a Timothy [name] patient.  1. Hypertension - prior to bout of pancreatitis, he was only on low dose lisinopril for mild HTN. There has been question raised of catecholamine effect of pancreatitis contributing to his hypertension. Blood pressure is now controlled on present regimen, but he is finding it difficult to take hydralazine every 6 hours. Discussed with Dr. Lovena Le. Will discontinue metoprolol in lieu of starting carvedilol 31m BID which should have more potent BP effect. Along with this change we will attempt to taper hydralazine down. He is currently taking 520mq6hours. Will have him decrease to 2536mID x 3 days then 41m38mD x 3 days then stop. His wife feels comfortable splitting his prior pills with a pill cutter. He will monitor BP daily and call if tending to run >150>865<100<784tolic. He will continue clonidine and Lasix for now so as not to make too many changes at one time.  Electrolytes and renal function are being followed by primary care. 2. Bilateral lower extremity edema - per GI, felt related to high IV fluid resuscitation as treatment for severe pancreatitis, coupled with hypoalbuminemia. This is improving on Lasix. Will check echocardiogram to assess LV function and diastolic parameters to exclude component of CHF. Low sodium diet reinforced. 3. Morbid obesity - once recovered from illness, would encourage regular physical activity which would aid in BP control as well. 4. Acute kidney injury - resolved by labs 6/15. He reports he had repeat labwork yesterday and is waiting to hear back from his PCP regarding the results. 5. Recent severe pancreatitis - has f/u tomorrow with GI. The patient will be following up with general surgery to discuss elective cholecystectomy after he has recovered from present illness.  Disposition: F/u with me in 1 month.   Medication Adjustments/Labs and Tests Ordered: Current medicines are reviewed at length with the patient today.  Concerns regarding medicines are outlined above. Medication changes, Labs and Tests ordered today are summarized above and listed in the Patient Instructions accessible in Encounters.   SignRaechel AcheC  01/21/2016 2:14 PM    [facility] [address] [place]  [phone]: [phone]: [phone]

## 2016-01-20 DIAGNOSIS — J209 Acute bronchitis, unspecified: Secondary | ICD-10-CM | POA: Diagnosis not present

## 2016-01-20 DIAGNOSIS — R601 Generalized edema: Secondary | ICD-10-CM | POA: Diagnosis not present

## 2016-01-20 DIAGNOSIS — E109 Type 1 diabetes mellitus without complications: Secondary | ICD-10-CM | POA: Diagnosis not present

## 2016-01-20 DIAGNOSIS — K8591 Acute pancreatitis with uninfected necrosis, unspecified: Secondary | ICD-10-CM | POA: Diagnosis not present

## 2016-01-21 ENCOUNTER — Encounter: Payer: Self-pay | Admitting: Physician Assistant

## 2016-01-21 ENCOUNTER — Ambulatory Visit (INDEPENDENT_AMBULATORY_CARE_PROVIDER_SITE_OTHER): Payer: Federal, State, Local not specified - PPO | Admitting: Physician Assistant

## 2016-01-21 VITALS — BP 120/60 | HR 86 | Ht 71.0 in | Wt 276.0 lb

## 2016-01-21 DIAGNOSIS — Z8719 Personal history of other diseases of the digestive system: Secondary | ICD-10-CM

## 2016-01-21 DIAGNOSIS — N179 Acute kidney failure, unspecified: Secondary | ICD-10-CM

## 2016-01-21 DIAGNOSIS — I10 Essential (primary) hypertension: Secondary | ICD-10-CM | POA: Diagnosis not present

## 2016-01-21 DIAGNOSIS — R600 Localized edema: Secondary | ICD-10-CM

## 2016-01-21 MED ORDER — HYDRALAZINE HCL 50 MG PO TABS: ORAL_TABLET | ORAL | Status: DC

## 2016-01-21 MED ORDER — CARVEDILOL 25 MG PO TABS: 25.0000 mg | ORAL_TABLET | Freq: Two times a day (BID) | ORAL | Status: AC

## 2016-01-21 NOTE — Patient Instructions (Addendum)
Medication Instructions:  Your physician has recommended you make the following change in your medication:  1.  STOP the Metoprolol 2.  DECREASE the Hydralazine as instructed:  Take 1/2 tablet three times a day for 3 days then take 1/2 tablet twice a day for 3 days then stop it 3.  START the Coreg 25 mg taking 1 tablet twice a day   Labwork: None ordered  Testing/Procedures: Your physician has requested that you have an echocardiogram. Echocardiography is a painless test that uses sound waves to create images of your heart. It provides your doctor with information about the size and shape of your heart and how well your heart's chambers and valves are working. This procedure takes approximately one hour. There are no restrictions for this procedure.    Follow-Up: Your physician recommends that you schedule a follow-up appointment in: 1 MONTH WITH DAYNA DUNN, PA-C   Any Other Special Instructions Will Be Listed Below (If Applicable).  PLEASE CALL OUR OFFICE IF YOUR BLOOD PRESSURE IS > 150 (TOP #) OR  < 100 (TOP #)   Echocardiogram An echocardiogram, or echocardiography, uses sound waves (ultrasound) to produce an image of your heart. The echocardiogram is simple, painless, obtained within a short period of time, and offers valuable information to your health care provider. The images from an echocardiogram can provide information such as:  Evidence of coronary artery disease (CAD).  Heart size.  Heart muscle function.  Heart valve function.  Aneurysm detection.  Evidence of a past heart attack.  Fluid buildup around the heart.  Heart muscle thickening.  Assess heart valve function. LET [facility] PROVIDER KNOW ABOUT:  Any allergies you have.  All medicines you are taking, including vitamins, herbs, eye drops, creams, and over-the-counter medicines.  Previous problems you or members of your family have had with the use of anesthetics.  Any blood disorders you  have.  Previous surgeries you have had.  Medical conditions you have.  Possibility of pregnancy, if this applies. BEFORE THE PROCEDURE  No special preparation is needed. Eat and drink normally.  PROCEDURE   In order to produce an image of your heart, gel will be applied to your chest and a wand-like tool (transducer) will be moved over your chest. The gel will help transmit the sound waves from the transducer. The sound waves will harmlessly bounce off your heart to allow the heart images to be captured in real-time motion. These images will then be recorded.  You may need an IV to receive a medicine that improves the quality of the pictures. AFTER THE PROCEDURE You may return to your normal schedule including diet, activities, and medicines, unless your health care provider tells you otherwise.   This information is not intended to replace advice given to you by your health care provider. Make sure you discuss any questions you have with your health care provider.   Document Released: 07/15/2000 Document Revised: 08/08/2014 Document Reviewed: 03/25/2013 Elsevier Interactive Patient Education [employer]2016 Elsevier Inc.   If you need a refill on your cardiac medications before your next appointment, please call your pharmacy.

## 2016-01-22 ENCOUNTER — Other Ambulatory Visit: Payer: Self-pay | Admitting: Gastroenterology

## 2016-01-22 DIAGNOSIS — Z9181 History of falling: Secondary | ICD-10-CM | POA: Diagnosis not present

## 2016-01-22 DIAGNOSIS — R740 Nonspecific elevation of levels of transaminase and lactic acid dehydrogenase [LDH]: Secondary | ICD-10-CM | POA: Diagnosis not present

## 2016-01-22 DIAGNOSIS — D638 Anemia in other chronic diseases classified elsewhere: Secondary | ICD-10-CM | POA: Diagnosis not present

## 2016-01-22 DIAGNOSIS — K8592 Acute pancreatitis with infected necrosis, unspecified: Secondary | ICD-10-CM | POA: Diagnosis not present

## 2016-01-22 DIAGNOSIS — K8591 Acute pancreatitis with uninfected necrosis, unspecified: Secondary | ICD-10-CM

## 2016-01-22 DIAGNOSIS — I10 Essential (primary) hypertension: Secondary | ICD-10-CM | POA: Diagnosis not present

## 2016-01-22 DIAGNOSIS — Z794 Long term (current) use of insulin: Secondary | ICD-10-CM | POA: Diagnosis not present

## 2016-01-22 DIAGNOSIS — E1165 Type 2 diabetes mellitus with hyperglycemia: Secondary | ICD-10-CM | POA: Diagnosis not present

## 2016-01-22 DIAGNOSIS — R10816 Epigastric abdominal tenderness: Secondary | ICD-10-CM | POA: Diagnosis not present

## 2016-01-22 DIAGNOSIS — E46 Unspecified protein-calorie malnutrition: Secondary | ICD-10-CM | POA: Diagnosis not present

## 2016-01-22 DIAGNOSIS — T888 Other specified complications of surgical and medical care, not elsewhere classified: Secondary | ICD-10-CM | POA: Diagnosis not present

## 2016-01-26 DIAGNOSIS — T888 Other specified complications of surgical and medical care, not elsewhere classified: Secondary | ICD-10-CM | POA: Diagnosis not present

## 2016-01-26 DIAGNOSIS — Z794 Long term (current) use of insulin: Secondary | ICD-10-CM | POA: Diagnosis not present

## 2016-01-26 DIAGNOSIS — D638 Anemia in other chronic diseases classified elsewhere: Secondary | ICD-10-CM | POA: Diagnosis not present

## 2016-01-26 DIAGNOSIS — R5381 Other malaise: Secondary | ICD-10-CM | POA: Diagnosis not present

## 2016-01-26 DIAGNOSIS — R601 Generalized edema: Secondary | ICD-10-CM | POA: Diagnosis not present

## 2016-01-26 DIAGNOSIS — K8592 Acute pancreatitis with infected necrosis, unspecified: Secondary | ICD-10-CM | POA: Diagnosis not present

## 2016-01-26 DIAGNOSIS — I10 Essential (primary) hypertension: Secondary | ICD-10-CM | POA: Diagnosis not present

## 2016-01-26 DIAGNOSIS — E46 Unspecified protein-calorie malnutrition: Secondary | ICD-10-CM | POA: Diagnosis not present

## 2016-01-26 DIAGNOSIS — E1165 Type 2 diabetes mellitus with hyperglycemia: Secondary | ICD-10-CM | POA: Diagnosis not present

## 2016-01-26 DIAGNOSIS — K8591 Acute pancreatitis with uninfected necrosis, unspecified: Secondary | ICD-10-CM | POA: Diagnosis not present

## 2016-01-26 DIAGNOSIS — Z9181 History of falling: Secondary | ICD-10-CM | POA: Diagnosis not present

## 2016-01-28 DIAGNOSIS — R740 Nonspecific elevation of levels of transaminase and lactic acid dehydrogenase [LDH]: Secondary | ICD-10-CM | POA: Diagnosis not present

## 2016-01-28 DIAGNOSIS — D638 Anemia in other chronic diseases classified elsewhere: Secondary | ICD-10-CM | POA: Diagnosis not present

## 2016-01-28 DIAGNOSIS — T888 Other specified complications of surgical and medical care, not elsewhere classified: Secondary | ICD-10-CM | POA: Diagnosis not present

## 2016-01-28 DIAGNOSIS — Z9181 History of falling: Secondary | ICD-10-CM | POA: Diagnosis not present

## 2016-01-28 DIAGNOSIS — K8592 Acute pancreatitis with infected necrosis, unspecified: Secondary | ICD-10-CM | POA: Diagnosis not present

## 2016-01-28 DIAGNOSIS — I10 Essential (primary) hypertension: Secondary | ICD-10-CM | POA: Diagnosis not present

## 2016-01-28 DIAGNOSIS — E1165 Type 2 diabetes mellitus with hyperglycemia: Secondary | ICD-10-CM | POA: Diagnosis not present

## 2016-01-28 DIAGNOSIS — R17 Unspecified jaundice: Secondary | ICD-10-CM | POA: Diagnosis not present

## 2016-01-28 DIAGNOSIS — E46 Unspecified protein-calorie malnutrition: Secondary | ICD-10-CM | POA: Diagnosis not present

## 2016-01-28 DIAGNOSIS — Z794 Long term (current) use of insulin: Secondary | ICD-10-CM | POA: Diagnosis not present

## 2016-01-28 DIAGNOSIS — K8591 Acute pancreatitis with uninfected necrosis, unspecified: Secondary | ICD-10-CM | POA: Diagnosis not present

## 2016-01-29 ENCOUNTER — Ambulatory Visit
Admission: RE | Admit: 2016-01-29 | Discharge: 2016-01-29 | Disposition: A | Discharge status: Home / Self Care | Payer: Federal, State, Local not specified - PPO | Source: Ambulatory Visit | Attending: Gastroenterology | Admitting: Gastroenterology

## 2016-01-29 ENCOUNTER — Inpatient Hospital Stay (HOSPITAL_COMMUNITY)
Admission: EM | Admit: 2016-01-29 | Discharge: 2016-02-02 | DRG: 438 | Disposition: A | Discharge status: Home / Self Care | Payer: Federal, State, Local not specified - PPO | Attending: Internal Medicine | Admitting: Internal Medicine

## 2016-01-29 ENCOUNTER — Encounter (HOSPITAL_COMMUNITY): Payer: Self-pay | Admitting: Emergency Medicine

## 2016-01-29 DIAGNOSIS — R05 Cough: Secondary | ICD-10-CM | POA: Diagnosis not present

## 2016-01-29 DIAGNOSIS — I158 Other secondary hypertension: Secondary | ICD-10-CM | POA: Diagnosis present

## 2016-01-29 DIAGNOSIS — R932 Abnormal findings on diagnostic imaging of liver and biliary tract: Secondary | ICD-10-CM | POA: Diagnosis not present

## 2016-01-29 DIAGNOSIS — K869 Disease of pancreas, unspecified: Secondary | ICD-10-CM

## 2016-01-29 DIAGNOSIS — K831 Obstruction of bile duct: Secondary | ICD-10-CM | POA: Diagnosis present

## 2016-01-29 DIAGNOSIS — E876 Hypokalemia: Secondary | ICD-10-CM | POA: Diagnosis not present

## 2016-01-29 DIAGNOSIS — Z794 Long term (current) use of insulin: Secondary | ICD-10-CM | POA: Diagnosis not present

## 2016-01-29 DIAGNOSIS — D649 Anemia, unspecified: Secondary | ICD-10-CM | POA: Diagnosis present

## 2016-01-29 DIAGNOSIS — E118 Type 2 diabetes mellitus with unspecified complications: Secondary | ICD-10-CM | POA: Diagnosis not present

## 2016-01-29 DIAGNOSIS — N179 Acute kidney failure, unspecified: Secondary | ICD-10-CM | POA: Diagnosis present

## 2016-01-29 DIAGNOSIS — K858 Other acute pancreatitis without necrosis or infection: Secondary | ICD-10-CM | POA: Diagnosis not present

## 2016-01-29 DIAGNOSIS — R17 Unspecified jaundice: Secondary | ICD-10-CM | POA: Diagnosis not present

## 2016-01-29 DIAGNOSIS — E871 Hypo-osmolality and hyponatremia: Secondary | ICD-10-CM | POA: Diagnosis not present

## 2016-01-29 DIAGNOSIS — I10 Essential (primary) hypertension: Secondary | ICD-10-CM | POA: Diagnosis present

## 2016-01-29 DIAGNOSIS — R059 Cough, unspecified: Secondary | ICD-10-CM

## 2016-01-29 DIAGNOSIS — E860 Dehydration: Secondary | ICD-10-CM | POA: Diagnosis not present

## 2016-01-29 DIAGNOSIS — K8591 Acute pancreatitis with uninfected necrosis, unspecified: Principal | ICD-10-CM | POA: Diagnosis present

## 2016-01-29 DIAGNOSIS — I129 Hypertensive chronic kidney disease with stage 1 through stage 4 chronic kidney disease, or unspecified chronic kidney disease: Secondary | ICD-10-CM | POA: Diagnosis not present

## 2016-01-29 DIAGNOSIS — R740 Nonspecific elevation of levels of transaminase and lactic acid dehydrogenase [LDH]: Secondary | ICD-10-CM | POA: Diagnosis not present

## 2016-01-29 DIAGNOSIS — K859 Acute pancreatitis without necrosis or infection, unspecified: Secondary | ICD-10-CM | POA: Diagnosis not present

## 2016-01-29 DIAGNOSIS — Z6836 Body mass index (BMI) 36.0-36.9, adult: Secondary | ICD-10-CM | POA: Diagnosis not present

## 2016-01-29 DIAGNOSIS — R7989 Other specified abnormal findings of blood chemistry: Secondary | ICD-10-CM | POA: Diagnosis not present

## 2016-01-29 DIAGNOSIS — E1169 Type 2 diabetes mellitus with other specified complication: Secondary | ICD-10-CM | POA: Diagnosis present

## 2016-01-29 DIAGNOSIS — N189 Chronic kidney disease, unspecified: Secondary | ICD-10-CM | POA: Diagnosis not present

## 2016-01-29 LAB — COMPREHENSIVE METABOLIC PANEL
ALT: 168 U/L — ABNORMAL HIGH (ref 17–63)
AST: 127 U/L — ABNORMAL HIGH (ref 15–41)
Albumin: 2.4 g/dL — ABNORMAL LOW (ref 3.5–5.0)
Alkaline Phosphatase: 434 U/L — ABNORMAL HIGH (ref 38–126)
Anion gap: 8 (ref 5–15)
BUN: 15 mg/dL (ref 6–20)
CO2: 25 mmol/L (ref 22–32)
Calcium: 8.8 mg/dL — ABNORMAL LOW (ref 8.9–10.3)
Chloride: 97 mmol/L — ABNORMAL LOW (ref 101–111)
Creatinine, Ser: 1.37 mg/dL — ABNORMAL HIGH (ref 0.61–1.24)
GFR calc Af Amer: >60 mL/min (ref >60)
GFR calc non Af Amer: >60 mL/min (ref >60)
Glucose, Bld: 185 mg/dL — ABNORMAL HIGH (ref 65–99)
Potassium: 4.1 mmol/L (ref 3.5–5.1)
Sodium: 130 mmol/L — ABNORMAL LOW (ref 135–145)
Total Bilirubin: 9.8 mg/dL — ABNORMAL HIGH (ref 0.3–1.2)
Total Protein: 6.8 g/dL (ref 6.5–8.1)

## 2016-01-29 LAB — CBC
HCT: 27 % — ABNORMAL LOW (ref 39.0–52.0)
Hemoglobin: 8.6 g/dL — ABNORMAL LOW (ref 13.0–17.0)
MCH: 28.8 pg (ref 26.0–34.0)
MCHC: 31.9 g/dL (ref 30.0–36.0)
MCV: 90.3 fL (ref 78.0–100.0)
Platelets: 401 10*3/uL — ABNORMAL HIGH (ref 150–400)
RBC: 2.99 MIL/uL — ABNORMAL LOW (ref 4.22–5.81)
RDW: 14.9 % (ref 11.5–15.5)
WBC: 10.4 10*3/uL (ref 4.0–10.5)

## 2016-01-29 LAB — GLUCOSE, CAPILLARY: Glucose-Capillary: 156 mg/dL — ABNORMAL HIGH (ref 65–99)

## 2016-01-29 LAB — LIPASE, BLOOD: Lipase: 27 U/L (ref 11–51)

## 2016-01-29 LAB — LACTIC ACID, PLASMA
Lactic Acid, Venous: 1.6 mmol/L (ref 0.5–1.9)
Lactic Acid, Venous: 1 mmol/L (ref 0.5–1.9)

## 2016-01-29 LAB — I-STAT CG4 LACTIC ACID, ED: Lactic Acid, Venous: 1.36 mmol/L (ref 0.5–1.9)

## 2016-01-29 MED ORDER — SODIUM CHLORIDE 0.9 % IV SOLN: Freq: Once | INTRAVENOUS | Status: DC

## 2016-01-29 MED ORDER — SODIUM CHLORIDE 0.9 % IV BOLUS (SEPSIS)
1000.0000 mL | Freq: Once | INTRAVENOUS | Status: AC
  Administered 2016-01-29: 1000 mL via INTRAVENOUS

## 2016-01-29 MED ORDER — LACTATED RINGERS IV SOLN
INTRAVENOUS | Status: DC
  Administered 2016-01-29: 20:00:00 via INTRAVENOUS
  Administered 2016-01-30 (×2): 1000 mL via INTRAVENOUS
  Administered 2016-01-30 (×2): via INTRAVENOUS
  Administered 2016-01-31: 1000 mL via INTRAVENOUS
  Administered 2016-02-01 – 2016-02-02 (×2): via INTRAVENOUS
  Filled 2016-01-29: qty 1000

## 2016-01-29 MED ORDER — PIPERACILLIN-TAZOBACTAM 3.375 G IVPB 30 MIN
3.3750 g | Freq: Once | INTRAVENOUS | Status: AC
  Administered 2016-01-29: 3.375 g via INTRAVENOUS
  Filled 2016-01-29: qty 50

## 2016-01-29 MED ORDER — CLONIDINE HCL 0.2 MG/24HR TD PTWK: 0.2000 mg | MEDICATED_PATCH | TRANSDERMAL | Status: DC

## 2016-01-29 MED ORDER — MORPHINE SULFATE (PF) 4 MG/ML IV SOLN
4.0000 mg | INTRAVENOUS | Status: DC | PRN
  Administered 2016-01-31 – 2016-02-01 (×3): 4 mg via INTRAVENOUS
  Filled 2016-01-29 (×3): qty 1

## 2016-01-29 MED ORDER — ALBUTEROL SULFATE (2.5 MG/3ML) 0.083% IN NEBU: 2.5000 mg | INHALATION_SOLUTION | Freq: Four times a day (QID) | RESPIRATORY_TRACT | Status: DC | PRN

## 2016-01-29 MED ORDER — INSULIN GLARGINE 100 UNIT/ML ~~LOC~~ SOLN
50.0000 [IU] | Freq: Every day | SUBCUTANEOUS | Status: DC
  Administered 2016-01-29 – 2016-02-01 (×3): 50 [IU] via SUBCUTANEOUS
  Filled 2016-01-29 (×5): qty 0.5

## 2016-01-29 MED ORDER — ONDANSETRON HCL 4 MG PO TABS: 4.0000 mg | ORAL_TABLET | Freq: Four times a day (QID) | ORAL | Status: DC | PRN

## 2016-01-29 MED ORDER — IOPAMIDOL (ISOVUE-300) INJECTION 61%
125.0000 mL | Freq: Once | INTRAVENOUS | Status: AC | PRN
  Administered 2016-01-29: 125 mL via INTRAVENOUS

## 2016-01-29 MED ORDER — PIPERACILLIN-TAZOBACTAM 3.375 G IVPB
3.3750 g | Freq: Three times a day (TID) | INTRAVENOUS | Status: DC
  Administered 2016-01-29 – 2016-02-02 (×11): 3.375 g via INTRAVENOUS
  Filled 2016-01-29 (×14): qty 50

## 2016-01-29 MED ORDER — ONDANSETRON HCL 4 MG/2ML IJ SOLN: 4.0000 mg | Freq: Four times a day (QID) | INTRAMUSCULAR | Status: DC | PRN

## 2016-01-29 MED ORDER — ALBUTEROL SULFATE HFA 108 (90 BASE) MCG/ACT IN AERS: 1.0000 | INHALATION_SPRAY | Freq: Four times a day (QID) | RESPIRATORY_TRACT | Status: DC | PRN

## 2016-01-29 MED ORDER — ACETAMINOPHEN 325 MG PO TABS: 650.0000 mg | ORAL_TABLET | Freq: Four times a day (QID) | ORAL | Status: DC | PRN

## 2016-01-29 MED ORDER — INSULIN ASPART 100 UNIT/ML ~~LOC~~ SOLN
0.0000 [IU] | SUBCUTANEOUS | Status: DC
  Administered 2016-01-29: 4 [IU] via SUBCUTANEOUS
  Administered 2016-01-30 (×3): 3 [IU] via SUBCUTANEOUS
  Administered 2016-01-30 (×2): 4 [IU] via SUBCUTANEOUS
  Administered 2016-02-01: 3 [IU] via SUBCUTANEOUS
  Administered 2016-02-01 (×2): 4 [IU] via SUBCUTANEOUS

## 2016-01-29 MED ORDER — INSULIN GLARGINE 100 UNIT/ML SOLOSTAR PEN: 50.0000 [IU] | PEN_INJECTOR | Freq: Every day | SUBCUTANEOUS | Status: DC

## 2016-01-29 MED ORDER — CARVEDILOL 25 MG PO TABS
25.0000 mg | ORAL_TABLET | Freq: Two times a day (BID) | ORAL | Status: DC
  Administered 2016-01-29 – 2016-02-02 (×7): 25 mg via ORAL
  Filled 2016-01-29 (×7): qty 1

## 2016-01-29 MED ORDER — PIPERACILLIN-TAZOBACTAM 3.375 G IVPB 30 MIN: 3.3750 g | Freq: Four times a day (QID) | INTRAVENOUS | Status: DC

## 2016-01-29 MED ORDER — ENOXAPARIN SODIUM 40 MG/0.4ML ~~LOC~~ SOLN
40.0000 mg | SUBCUTANEOUS | Status: DC
  Administered 2016-01-29 – 2016-01-31 (×3): 40 mg via SUBCUTANEOUS
  Filled 2016-01-29 (×3): qty 0.4

## 2016-01-29 NOTE — ED Notes (Signed)
5W accepts report at this time.

## 2016-01-29 NOTE — ED Provider Notes (Signed)
CSN: [id number]     Arrival date & time 01/29/16  1458 History   First MD Initiated Contact with Patient 01/29/16 1537     Chief Complaint  Patient presents with  . Pancreatitis   (Consider location/radiation/quality/duration/timing/severity/associated sxs/prior Treatment) HPI 40 y.o. male with a hx of HTN, obesity, recently developed IDDM in the setting of severe necrotizing pancreatitis, presents to the Emergency Department today from outpatient CT due to results. He was admitted 5/22-01/05/16 with acute severe necrotizing pancreatitis with shock/sepsis, complicated by acute respiratory failure, transaminitis, anemia of critical illness, acute encephalopathy, AKI (Cr 1.36), protein-calorie malnutrition with severe hypoalbuminemia, hypernatremia & hypokalemia. Pt states that he had a gallstone stone blocking his pancreatic duct. Pt states that he has been doing well since his discharge, but began developing yellowing of his skin. Does not endrose any pain currently. No N/V/D. No decrease in PO intake. No CP/SOB/ABD pain.          Gastroenterology- Dr. Michail Sermon [facility])  Past Medical History  Diagnosis Date  . Essential hypertension   . Pancreatitis, acute 12/22/2015    a. 11/2015 - acute severe pancreatitis with shock/sepsis, complicated by acute respiratory failure, transaminitis, anemia of critical illness, acute encephalopathy, AKI (Cr 1.36), protein-calorie malnutrition with severe hypoalbuminemia, & hypokalemia.  Marland Kitchen AKI (acute kidney injury) ([place]) 12/22/2015  . Anxiety   . Diabetes mellitus ([place])   . Obesity    Past Surgical History  Procedure Laterality Date  . Back surgery     Family History  Problem Relation Age of Onset  . Heart attack Neg Hx   . Hypertension Father   . Stroke Neg Hx   . Schizophrenia Father    Social History  Substance Use Topics  . Smoking status: Never Smoker   . Smokeless tobacco: Never Used  . Alcohol Use: 0.0 oz/week    0 Standard drinks or  equivalent per week     Comment: 1 beer per night previously - now rare use    Review of Systems ROS reviewed and all are negative for acute change except as noted in the HPI.  Allergies  Review of patient's allergies indicates no known allergies.  Home Medications   Prior to Admission medications   Medication Sig Start Date End Date Taking? Authorizing Provider  acetaminophen (TYLENOL) 325 MG tablet Take 650 mg by mouth every 6 (six) hours as needed for fever.    Historical Provider, MD  albuterol (PROVENTIL HFA;VENTOLIN HFA) 108 (90 Base) MCG/ACT inhaler Inhale 1-2 puffs into the lungs every 6 (six) hours as needed for wheezing or shortness of breath. 01/14/16   Ripley Fraise, MD  blood glucose meter kit and supplies Dispense based on patient and insurance preference. Use up to four times daily as directed. (FOR ICD-9 250.00, 250.01). 01/05/16   Geradine Girt, DO  carvedilol (COREG) 25 MG tablet Take 1 tablet (25 mg total) by mouth 2 (two) times daily. 01/21/16   Dayna N Dunn, PA-C  cloNIDine (CATAPRES - DOSED IN MG/24 HR) 0.2 mg/24hr patch Place 1 patch (0.2 mg total) onto the skin once a week. 01/05/16   Geradine Girt, DO  furosemide (LASIX) 20 MG tablet Take 20 mg by mouth daily. Reported on 01/21/2016 01/20/16   Historical Provider, MD  hydrALAZINE (APRESOLINE) 50 MG tablet TAKE 1/2 TABLET BY MOUTH THREE TIMES A DAY FOR 3 DAYS, THEN TAKE 1/2 TABLET BY MOUTH TWICE A DAY FOR 3 DAYS THEN STOP IT 01/21/16   Charlie Pitter,  PA-C  insulin aspart (NOVOLOG) 100 UNIT/ML FlexPen 8 units with meals (as long as eating 50%) PLUS the following scale: CBG 70 - 120: 0 units CBG 121 - 150: 2 units CBG 151 - 200: 3 units CBG 201 - 250: 5 units CBG 251 - 300: 8 units CBG 301 - 350: 11 units CBG 351 - 400: 15 units 01/05/16   Geradine Girt, DO  Insulin Glargine (LANTUS SOLOSTAR) 100 UNIT/ML Solostar Pen Inject 40 Units into the skin daily at 10 pm. 01/05/16   Geradine Girt, DO  Insulin Pen Needle 31G X 5 MM  MISC For insulin injections 01/05/16   Geradine Girt, DO  pantoprazole (PROTONIX) 40 MG tablet Take 1 tablet (40 mg total) by mouth at bedtime. 01/05/16   Jessica U Vann, DO   BP 120/69 mmHg  Pulse 82  Temp(Src) 100.2 F (37.9 C) (Oral)  Resp 20  SpO2 97%   Physical Exam  Constitutional: He is oriented to person, place, and time. He appears well-developed and well-nourished.  HENT:  Head: Normocephalic and atraumatic.  Eyes: EOM are normal. Pupils are equal, round, and reactive to light.  Scleral Icterus  Neck: Normal range of motion. Neck supple. No tracheal deviation present.  Cardiovascular: Normal rate, regular rhythm, normal heart sounds, intact distal pulses and normal pulses.   No murmur heard. Pulmonary/Chest: Effort normal and breath sounds normal. No accessory muscle usage. No respiratory distress.  Abdominal: Soft. Normal appearance and bowel sounds are normal. There is no rigidity, no rebound, no guarding, no tenderness at McBurney's point and negative Murphy's sign.  Musculoskeletal: Normal range of motion.  Neurological: He is alert and oriented to person, place, and time.  Skin: Skin is warm and dry.  Jaundice present throughout   Psychiatric: He has a normal mood and affect. His behavior is normal. Thought content normal.  Nursing note and vitals reviewed.  ED Course  Procedures (including critical care time) Labs Review Labs Reviewed  CBC - Abnormal; Notable for the following:    RBC 2.99 (*)    Hemoglobin 8.6 (*)    HCT 27.0 (*)    Platelets 401 (*)    All other components within normal limits  COMPREHENSIVE METABOLIC PANEL - Abnormal; Notable for the following:    Sodium 130 (*)    Chloride 97 (*)    Glucose, Bld 185 (*)    Creatinine, Ser 1.37 (*)    Calcium 8.8 (*)    Albumin 2.4 (*)    AST 127 (*)    ALT 168 (*)    Alkaline Phosphatase 434 (*)    Total Bilirubin 9.8 (*)    All other components within normal limits  LIPASE, BLOOD  I-STAT CG4 LACTIC  ACID, ED   Imaging Review Ct Abdomen Pelvis W Contrast  01/29/2016  CLINICAL DATA:  Follow-up pancreatitis. EXAM: CT ABDOMEN AND PELVIS WITH CONTRAST TECHNIQUE: Multidetector CT imaging of the abdomen and pelvis was performed using the standard protocol following bolus administration of intravenous contrast. CONTRAST:  112m ISOVUE-300 IOPAMIDOL (ISOVUE-300) INJECTION 61% COMPARISON:  CT scans since Dec 21, 2015 with the most recent comparison from January 02, 2016 FINDINGS: Mild atelectasis in the left lung base. A small calcified granuloma is seen in the right lung base. Lung bases are otherwise unchanged and unremarkable. No free air. Necrotizing pancreatitis is again identified. The pancreatic body and tail are no longer visualized due to necrosis. There is a large peripancreatic fluid collection measuring 24 x  14 cm on series 2, image 41. A few adjacent smaller collections are also seen such as anterior to the SMV on image 51. There is extensive fat stranding in the surrounding tissues. The peripancreatic fluid collection exerts mass effect on the distal common bile duct resulting in intra and extrahepatic biliary duct dilatation which is a new finding. No focal liver masses are identified. The hepatic veins are patent. There is narrowing of the portal vein as it passes by the peripancreatic collection. This narrowing is best seen on coronal images 49 through 55. No definitive portal vein thrombosis is identified at this time. The SMV remains patent. The splenic vein is no longer visualized. The gallbladder is markedly distended which correlates with the central common bile duct. The spleen is slightly more prominent in the interval but otherwise normal. Some collateral splenic vessels are identified near the splenic hilum. The adrenal glands and kidneys are normal. The abdominal aorta is normal in caliber with no atherosclerosis. No adenopathy identified. The peripancreatic fluid collection exerts mass effect  on the second portion the duodenum without gastric distention. The small bowel is otherwise normal. The colon is normal. No secondary evidence of appendicitis. No adenopathy or mass in the pelvis. The bladder is normal. The prostate and seminal vesicles are unremarkable. Delayed images demonstrate no filling defects in the upper renal collecting systems. Visualized bones are unremarkable other than mild degenerative changes in the spine. IMPRESSION: 1. Necrotizing pancreatitis with interval development of a large 24 x 14 cm peripancreatic fluid collection. The fluid collection exerts mass effect on the central common bile duct resulting in intra and extrahepatic biliary duct dilatation and gallbladder distention, both of which are new findings. No blood flow is seen in the splenic vein. The peripancreatic collection exerts mass effect on the portal vein as it passes the collection with significant narrowing but no definitive thrombosis. These results will be called to the ordering clinician or representative by the Radiologist Assistant, and communication documented in the PACS or zVision Dashboard. Electronically Signed   By: Dorise Bullion III M.D   On: 01/29/2016 13:44   I have personally reviewed and evaluated these images and lab results as part of my medical decision-making.   EKG Interpretation None      MDM  I have reviewed and evaluated the relevant laboratory values I have reviewed and evaluated the relevant imaging studies.  I have reviewed the relevant previous healthcare records. I obtained HPI from historian. Patient discussed with supervising physician  ED Course:  Assessment: Pt is a 37yM with hx HTN, obesity, recently developed IDDM in the setting of severe necrotizing pancreatitis, who presents from outpatient CT due to necrotizing pancreatitis. On exam, pt in NAD. Nontoxic/nonseptic appearing. VSS. Temp 100.34F. Lungs CTA. Heart RRR. Abdomen nontender soft. Pt does have jaundice.  istat lactic 1.36. Lipase 27. AST 127 ALT 168. Bilirubin 9.8. CT showed necrotizing pancreatitis with mass effect to central bile duct resulting in biliary duct dilation and gallbladder distention. Made NPO. Given fluids in ED. Started on Zosyn. Consult with GI (Dr. Michail Sermon) recommended NPO. Fluids and supportive care. Will see as consult. Possible ERCP Sunday or Monday. Continue to trend bilirubin levels. Plan is to admit to medicine.   Disposition/Plan:  Admit Pt acknowledges and agrees with plan  Supervising Physician Alfonzo Beers, MD    Final diagnoses:  Necrotizing pancreatitis      Shary Decamp, PA-C 01/29/16 [place], MD 01/30/16 [phone]

## 2016-01-29 NOTE — ED Notes (Signed)
May 22nd PT experienced pain and vomiting. PT had a blocking pancreatic duct gall stone. PT then developed necrotizing pancreatitis. PT was admitted for 16 days and spent 3.5 of those on a vent in the ICU. PT was discharged June 6. PT had a 101 fever June 14 and was evaluated in the ED. PT was sent home with antibiotics and did well.   This Monday night PT became jaundiced. PT saw his PCP yesterday and his liver enzymes are very elevated per PT's wife.

## 2016-01-29 NOTE — Consult Note (Signed)
Referring Provider: Dr. Canary Brim Primary Care Physician:  Aretta Nip, MD Primary Gastroenterologist:  Dr. Michail Sermon  Reason for Consultation:  Jaundice  HPI: Moritz Lever is a 40 y.o. male with a recent hospitalization for necrotizing pancreatitis (source thought to be gallstones) who received a course of Imipenem who was discharged earlier this month and was managing ok at home. He had normal LFTs on 01/13/16 and had a rise in his AST/ALT noted on 01/20/16 with AST 137, ALT 118, ALP 257, TB 1.2. He was doing ok on 01/22/16 and plan was to repeat his labs in early July and do an updated CT scan next month as well. He developed worsened jaundice and went to his primary doctor's office yesterday and was found to have hyperbilirubinemia with TB 8.3 (Direct Bili 5.5), ALP 450, AST 154, ALT 182, GGT 599. Lipase 37.  On admit today: TB 9.8, ALP 434, AST 127, ALT 168, Lipase 27. Lactic acid 1.36. Temp 100.6. He was sent for a CT that was done today and again showed the necrotizing pancreatitis but also showed a new large peripancreatic fluid collection (24 cm X 14 cm) that is causing a mass effect on the common bile duct with intrahepatic and extrahepatic biliary dilation. Denies abdominal pain, nausea, vomiting, fevers, chills, chest pain, or shortness of breath. Appetite ok. No alcohol. Wife not at bedside right now. Has been having dark-colored urine and light-colored stool recently.    Past Medical History  Diagnosis Date  . Essential hypertension   . Pancreatitis, acute 12/22/2015    a. 11/2015 - acute severe pancreatitis with shock/sepsis, complicated by acute respiratory failure, transaminitis, anemia of critical illness, acute encephalopathy, AKI (Cr 1.36), protein-calorie malnutrition with severe hypoalbuminemia, & hypokalemia.  Marland Kitchen AKI (acute kidney injury) ([place]) 12/22/2015  . Anxiety   . Diabetes mellitus ([place])   . Obesity     Past Surgical History  Procedure Laterality Date  . Back  surgery      Prior to Admission medications   Medication Sig Start Date End Date Taking? Authorizing Provider  acetaminophen (TYLENOL) 325 MG tablet Take 650 mg by mouth every 6 (six) hours as needed for fever.   Yes Historical Provider, MD  albuterol (PROVENTIL HFA;VENTOLIN HFA) 108 (90 Base) MCG/ACT inhaler Inhale 1-2 puffs into the lungs every 6 (six) hours as needed for wheezing or shortness of breath. 01/14/16  Yes Ripley Fraise, MD  carvedilol (COREG) 25 MG tablet Take 1 tablet (25 mg total) by mouth 2 (two) times daily. 01/21/16  Yes Dayna N Dunn, PA-C  cloNIDine (CATAPRES - DOSED IN MG/24 HR) 0.2 mg/24hr patch Place 1 patch (0.2 mg total) onto the skin once a week. 01/05/16  Yes Geradine Girt, DO  furosemide (LASIX) 20 MG tablet Take 20 mg by mouth daily. Reported on 01/21/2016 01/20/16  Yes Historical Provider, MD  insulin aspart (NOVOLOG) 100 UNIT/ML FlexPen 8 units with meals (as long as eating 50%) PLUS the following scale: CBG 70 - 120: 0 units CBG 121 - 150: 2 units CBG 151 - 200: 3 units CBG 201 - 250: 5 units CBG 251 - 300: 8 units CBG 301 - 350: 11 units CBG 351 - 400: 15 units 01/05/16  Yes Geradine Girt, DO  Insulin Glargine (LANTUS SOLOSTAR) 100 UNIT/ML Solostar Pen Inject 40 Units into the skin daily at 10 pm. Patient taking differently: Inject 50 Units into the skin daily at 10 pm.  01/05/16  Yes Geradine Girt, DO  pantoprazole (  PROTONIX) 40 MG tablet Take 1 tablet (40 mg total) by mouth at bedtime. 01/05/16  Yes Geradine Girt, DO  blood glucose meter kit and supplies Dispense based on patient and insurance preference. Use up to four times daily as directed. (FOR ICD-9 250.00, 250.01). 01/05/16   Geradine Girt, DO  hydrALAZINE (APRESOLINE) 50 MG tablet TAKE 1/2 TABLET BY MOUTH THREE TIMES A DAY FOR 3 DAYS, THEN TAKE 1/2 TABLET BY MOUTH TWICE A DAY FOR 3 DAYS THEN STOP IT Patient not taking: Reported on 01/29/2016 01/21/16   Dayna N Dunn, PA-C  Insulin Pen Needle 31G X 5 MM MISC  For insulin injections 01/05/16   Geradine Girt, DO    Scheduled Meds:  Continuous Infusions: . sodium chloride     PRN Meds:.  Allergies as of 01/29/2016  . (No Known Allergies)    Family History  Problem Relation Age of Onset  . Heart attack Neg Hx   . Hypertension Father   . Stroke Neg Hx   . Schizophrenia Father     Social History   Social History  . Marital Status: Married    Spouse Name: N/A  . Number of Children: N/A  . Years of Education: N/A   Occupational History  .      Works in administration at [employer]   Social History Main Topics  . Smoking status: Never Smoker   . Smokeless tobacco: Never Used  . Alcohol Use: 0.0 oz/week    0 Standard drinks or equivalent per week     Comment: 1 beer per night previously - now rare use  . Drug Use: No  . Sexual Activity: Not on file   Other Topics Concern  . Not on file   Social History Narrative    Review of Systems: All negative except as stated above in HPI.  Physical Exam: Vital signs: Filed Vitals:   01/29/16 1700 01/29/16 1745  BP: 117/70 117/71  Pulse: 77 80  Temp:  100.6  Resp: 16 16     General:  Lethargic, jaundice, no acute distress, obese HEENT: +scleral icterus, oropharynx clear Neck: supple, nontender Lungs:  Clear throughout to auscultation.   No wheezes, crackles, or rhonchi. No acute distress. Heart:  Regular rate and rhythm; no murmurs, clicks, rubs,  or gallops. Abdomen: epigastric and RUQ tenderness with minimal guarding, soft, nondistended, +BS  Rectal:  Deferred Ext: +LE edema  GI:  Lab Results:  Recent Labs  01/29/16 1605  WBC 10.4  HGB 8.6*  HCT 27.0*  PLT 401*   BMET  Recent Labs  01/29/16 1605  NA 130*  K 4.1  CL 97*  CO2 25  GLUCOSE 185*  BUN 15  CREATININE 1.37*  CALCIUM 8.8*   LFT  Recent Labs  01/29/16 1605  PROT 6.8  ALBUMIN 2.4*  AST 127*  ALT 168*  ALKPHOS 434*  BILITOT 9.8*   PT/INR No results for input(s): LABPROT,  INR in the last 72 hours.   Studies/Results: Ct Abdomen Pelvis W Contrast  01/29/2016  CLINICAL DATA:  Follow-up pancreatitis. EXAM: CT ABDOMEN AND PELVIS WITH CONTRAST TECHNIQUE: Multidetector CT imaging of the abdomen and pelvis was performed using the standard protocol following bolus administration of intravenous contrast. CONTRAST:  160m ISOVUE-300 IOPAMIDOL (ISOVUE-300) INJECTION 61% COMPARISON:  CT scans since Dec 21, 2015 with the most recent comparison from January 02, 2016 FINDINGS: Mild atelectasis in the left lung base. A small calcified granuloma is seen in the  right lung base. Lung bases are otherwise unchanged and unremarkable. No free air. Necrotizing pancreatitis is again identified. The pancreatic body and tail are no longer visualized due to necrosis. There is a large peripancreatic fluid collection measuring 24 x 14 cm on series 2, image 41. A few adjacent smaller collections are also seen such as anterior to the SMV on image 51. There is extensive fat stranding in the surrounding tissues. The peripancreatic fluid collection exerts mass effect on the distal common bile duct resulting in intra and extrahepatic biliary duct dilatation which is a new finding. No focal liver masses are identified. The hepatic veins are patent. There is narrowing of the portal vein as it passes by the peripancreatic collection. This narrowing is best seen on coronal images 49 through 55. No definitive portal vein thrombosis is identified at this time. The SMV remains patent. The splenic vein is no longer visualized. The gallbladder is markedly distended which correlates with the central common bile duct. The spleen is slightly more prominent in the interval but otherwise normal. Some collateral splenic vessels are identified near the splenic hilum. The adrenal glands and kidneys are normal. The abdominal aorta is normal in caliber with no atherosclerosis. No adenopathy identified. The peripancreatic fluid collection  exerts mass effect on the second portion the duodenum without gastric distention. The small bowel is otherwise normal. The colon is normal. No secondary evidence of appendicitis. No adenopathy or mass in the pelvis. The bladder is normal. The prostate and seminal vesicles are unremarkable. Delayed images demonstrate no filling defects in the upper renal collecting systems. Visualized bones are unremarkable other than mild degenerative changes in the spine. IMPRESSION: 1. Necrotizing pancreatitis with interval development of a large 24 x 14 cm peripancreatic fluid collection. The fluid collection exerts mass effect on the central common bile duct resulting in intra and extrahepatic biliary duct dilatation and gallbladder distention, both of which are new findings. No blood flow is seen in the splenic vein. The peripancreatic collection exerts mass effect on the portal vein as it passes the collection with significant narrowing but no definitive thrombosis. These results will be called to the ordering clinician or representative by the Radiologist Assistant, and communication documented in the PACS or zVision Dashboard. Electronically Signed   By: Dorise Bullion III M.D   On: 01/29/2016 13:44    Impression/Plan: Severe necrotizing pancreatitis with a new peripancreatic fluid collection that is blocking the CBD causing obstructive jaundice. Will need an ERCP and biliary stent placed if bilirubin continues to rise, which it likely will due to this fluid collection. Clear liquid diet today and NPO after midnight in case ERCP needed tomorrow. Supportive care. IVFs. Agree with antibiotics. D/W Dr. Benson Norway who will help with management of the obstructive jaundice.    LOS: 0 days   [place] C.  01/29/2016, 6:36 PM  Pager [phone]  If no answer or after 5 PM call [phone]

## 2016-01-29 NOTE — H&P (Addendum)
History and Physical    Hobson Lax UXY:[id number] DOB: Nov 15, 1975 DOA: 01/29/2016  PCP: Aretta Nip, MD Consultants:  Michail Sermon - GI Patient coming from: home  Chief Complaint: recurrent pancreatitis  HPI: Timothy [name] is a 40 y.o. male with medical history significant of recent hospitalization for pancreatitis presenting with recurrence.  He was admitted from 12/21/15-01/05/15 with acute severe necrotizing pancreatitis with shock/sepsis complicated by acute respiratory failure, transaminitis, anemia of critical illness, acute encephalopathy, AKI (Cr 1.36), protein-calorie malnutrition with severe hypoalbuminemia and hypokalemia.  He was discharged from [facility] on 6/6 and was doing fine until his wife noticed he was yellow on Monday PM (6/25).  Went to Dr. Zadie Rhine on Tuesday (6/26) for check and was sent home without intervention; it got worse by Thursday and so she did blood work yesterday and repeat CT today.  After the results came back, GI asked him to come back to the ER for admission.    Reports he has been feeling fine.  No fevers at home.  No abdominal pain.  No n/v.  Clay-colored formed stools today, regular BMs.  Tires more easily than prior to hospitalization.    ED Course: Elevation LFTs, bili 9.8, normal lactate and lipase.  CT with necrotizing pancreatitis with mass effect to central bile duct resulting in biliary duct dilation and GB distention.  Made NPO, started IVF, started Zosyn.  Review of Systems: As per HPI; otherwise 10 point review of systems reviewed and negative.   Ambulatory Status: without assistance  Past Medical History  Diagnosis Date  . Essential hypertension     started on lisinopril about 45 days prior to pancreatitis  . Pancreatitis, acute 12/22/2015    a. 11/2015 - acute severe pancreatitis with shock/sepsis, complicated by acute respiratory failure, transaminitis, anemia of critical illness, acute encephalopathy, AKI (Cr 1.36), protein-calorie  malnutrition with severe hypoalbuminemia, & hypokalemia.  Marland Kitchen AKI (acute kidney injury) ([place]) 12/22/2015  . Anxiety   . Diabetes mellitus ([place])   . Obesity     Past Surgical History  Procedure Laterality Date  . Back surgery      Social History   Social History  . Marital Status: Married    Spouse Name: N/A  . Number of Children: N/A  . Years of Education: N/A   Occupational History  .      Works in administration at [employer]   Social History Main Topics  . Smoking status: Never Smoker   . Smokeless tobacco: Never Used  . Alcohol Use: 0.0 oz/week    0 Standard drinks or equivalent per week     Comment: 1 beer per night previously - now rare use  . Drug Use: No  . Sexual Activity: Not on file   Other Topics Concern  . Not on file   Social History Narrative    No Known Allergies  Family History  Problem Relation Age of Onset  . Heart attack Neg Hx   . Hypertension Father   . Stroke Neg Hx   . Schizophrenia Father   . Diabetes Mother     Prior to Admission medications   Medication Sig Start Date End Date Taking? Authorizing Provider  acetaminophen (TYLENOL) 325 MG tablet Take 650 mg by mouth every 6 (six) hours as needed for fever.   Yes Historical Provider, MD  albuterol (PROVENTIL HFA;VENTOLIN HFA) 108 (90 Base) MCG/ACT inhaler Inhale 1-2 puffs into the lungs every 6 (six) hours as needed for wheezing or shortness of breath.  01/14/16  Yes Ripley Fraise, MD  carvedilol (COREG) 25 MG tablet Take 1 tablet (25 mg total) by mouth 2 (two) times daily. 01/21/16  Yes Dayna N Dunn, PA-C  cloNIDine (CATAPRES - DOSED IN MG/24 HR) 0.2 mg/24hr patch Place 1 patch (0.2 mg total) onto the skin once a week. 01/05/16  Yes Geradine Girt, DO  furosemide (LASIX) 20 MG tablet Take 20 mg by mouth daily. Reported on 01/21/2016 01/20/16  Yes Historical Provider, MD  insulin aspart (NOVOLOG) 100 UNIT/ML FlexPen 8 units with meals (as long as eating 50%) PLUS the following  scale: CBG 70 - 120: 0 units CBG 121 - 150: 2 units CBG 151 - 200: 3 units CBG 201 - 250: 5 units CBG 251 - 300: 8 units CBG 301 - 350: 11 units CBG 351 - 400: 15 units 01/05/16  Yes Jessica U Vann, DO  Insulin Glargine (LANTUS SOLOSTAR) 100 UNIT/ML Solostar Pen Inject 40 Units into the skin daily at 10 pm. Patient taking differently: Inject 50 Units into the skin daily at 10 pm.  01/05/16  Yes Jessica U Vann, DO  pantoprazole (PROTONIX) 40 MG tablet Take 1 tablet (40 mg total) by mouth at bedtime. 01/05/16  Yes Geradine Girt, DO  blood glucose meter kit and supplies Dispense based on patient and insurance preference. Use up to four times daily as directed. (FOR ICD-9 250.00, 250.01). 01/05/16   Geradine Girt, DO  hydrALAZINE (APRESOLINE) 50 MG tablet TAKE 1/2 TABLET BY MOUTH THREE TIMES A DAY FOR 3 DAYS, THEN TAKE 1/2 TABLET BY MOUTH TWICE A DAY FOR 3 DAYS THEN STOP IT Patient not taking: Reported on 01/29/2016 01/21/16   Dayna N Dunn, PA-C  Insulin Pen Needle 31G X 5 MM MISC For insulin injections 01/05/16   Geradine Girt, DO    Physical Exam: Filed Vitals:   01/29/16 1630 01/29/16 1645 01/29/16 1700 01/29/16 1745  BP: 111/68 111/64 117/70 117/71  Pulse: 78 78 77 80  Temp:      TempSrc:      Resp: '16 16 16 16  ' SpO2: 95% 96% 96% 96%     General: Appears calm and comfortable and is NAD Eyes:  PERRL, EOMI, normal lids, iris; grossly icteric conjunctivae ENT:  grossly normal hearing, lips & tongue, mmm Neck:  no LAD, masses or thyromegaly Cardiovascular:  RRR, no m/r/g. No LE edema.  Respiratory:  CTA bilaterally, no w/r/r. Normal respiratory effort. Abdomen:  soft, nd, mild TTP along midepigastric region, hypoactive BS Skin:  no rash or induration seen on limited exam Musculoskeletal:  grossly normal tone BUE/BLE, good ROM, no bony abnormality Psychiatric:  grossly normal mood and affect, speech fluent and appropriate, AOx3 Neurologic:  CN 2-12 grossly intact, moves all extremities in  coordinated fashion, sensation intact  Labs on Admission: I have personally reviewed following labs and imaging studies  CBC:  Recent Labs Lab 01/29/16 1605  WBC 10.4  HGB 8.6*  HCT 27.0*  MCV 90.3  PLT 967*   Basic Metabolic Panel:  Recent Labs Lab 01/29/16 1605  NA 130*  K 4.1  CL 97*  CO2 25  GLUCOSE 185*  BUN 15  CREATININE 1.37*  CALCIUM 8.8*   GFR: Estimated Creatinine Clearance: 95.6 mL/min (by C-G formula based on Cr of 1.37). Liver Function Tests:  Recent Labs Lab 01/29/16 1605  AST 127*  ALT 168*  ALKPHOS 434*  BILITOT 9.8*  PROT 6.8  ALBUMIN 2.4*    Recent Labs  Lab 01/29/16 1605  LIPASE 27   No results for input(s): AMMONIA in the last 168 hours. Coagulation Profile: No results for input(s): INR, PROTIME in the last 168 hours. Cardiac Enzymes: No results for input(s): CKTOTAL, CKMB, CKMBINDEX, TROPONINI in the last 168 hours. BNP (last 3 results) No results for input(s): PROBNP in the last 8760 hours. HbA1C: No results for input(s): HGBA1C in the last 72 hours. CBG: No results for input(s): GLUCAP in the last 168 hours. Lipid Profile: No results for input(s): CHOL, HDL, LDLCALC, TRIG, CHOLHDL, LDLDIRECT in the last 72 hours. Thyroid Function Tests: No results for input(s): TSH, T4TOTAL, FREET4, T3FREE, THYROIDAB in the last 72 hours. Anemia Panel: No results for input(s): VITAMINB12, FOLATE, FERRITIN, TIBC, IRON, RETICCTPCT in the last 72 hours. Urine analysis: No results found for: COLORURINE, APPEARANCEUR, LABSPEC, PHURINE, GLUCOSEU, HGBUR, BILIRUBINUR, KETONESUR, PROTEINUR, UROBILINOGEN, NITRITE, LEUKOCYTESUR  Creatinine Clearance: Estimated Creatinine Clearance: 95.6 mL/min (by C-G formula based on Cr of 1.37).  Sepsis Labs: '@LABRCNTIP' (procalcitonin:4,lacticidven:4) )No results found for this or any previous visit (from the past 240 hour(s)).   Radiological Exams on Admission: Ct Abdomen Pelvis W Contrast  01/29/2016   CLINICAL DATA:  Follow-up pancreatitis. EXAM: CT ABDOMEN AND PELVIS WITH CONTRAST TECHNIQUE: Multidetector CT imaging of the abdomen and pelvis was performed using the standard protocol following bolus administration of intravenous contrast. CONTRAST:  118m ISOVUE-300 IOPAMIDOL (ISOVUE-300) INJECTION 61% COMPARISON:  CT scans since Dec 21, 2015 with the most recent comparison from January 02, 2016 FINDINGS: Mild atelectasis in the left lung base. A small calcified granuloma is seen in the right lung base. Lung bases are otherwise unchanged and unremarkable. No free air. Necrotizing pancreatitis is again identified. The pancreatic body and tail are no longer visualized due to necrosis. There is a large peripancreatic fluid collection measuring 24 x 14 cm on series 2, image 41. A few adjacent smaller collections are also seen such as anterior to the SMV on image 51. There is extensive fat stranding in the surrounding tissues. The peripancreatic fluid collection exerts mass effect on the distal common bile duct resulting in intra and extrahepatic biliary duct dilatation which is a new finding. No focal liver masses are identified. The hepatic veins are patent. There is narrowing of the portal vein as it passes by the peripancreatic collection. This narrowing is best seen on coronal images 49 through 55. No definitive portal vein thrombosis is identified at this time. The SMV remains patent. The splenic vein is no longer visualized. The gallbladder is markedly distended which correlates with the central common bile duct. The spleen is slightly more prominent in the interval but otherwise normal. Some collateral splenic vessels are identified near the splenic hilum. The adrenal glands and kidneys are normal. The abdominal aorta is normal in caliber with no atherosclerosis. No adenopathy identified. The peripancreatic fluid collection exerts mass effect on the second portion the duodenum without gastric distention. The small  bowel is otherwise normal. The colon is normal. No secondary evidence of appendicitis. No adenopathy or mass in the pelvis. The bladder is normal. The prostate and seminal vesicles are unremarkable. Delayed images demonstrate no filling defects in the upper renal collecting systems. Visualized bones are unremarkable other than mild degenerative changes in the spine. IMPRESSION: 1. Necrotizing pancreatitis with interval development of a large 24 x 14 cm peripancreatic fluid collection. The fluid collection exerts mass effect on the central common bile duct resulting in intra and extrahepatic biliary duct dilatation and gallbladder distention, both  of which are new findings. No blood flow is seen in the splenic vein. The peripancreatic collection exerts mass effect on the portal vein as it passes the collection with significant narrowing but no definitive thrombosis. These results will be called to the ordering clinician or representative by the Radiologist Assistant, and communication documented in the PACS or zVision Dashboard. Electronically Signed   By: Dorise Bullion III M.D   On: 01/29/2016 13:44    EKG: none  Assessment/Plan Principal Problem:   Necrotizing pancreatitis Active Problems:   AKI (acute kidney injury) ([place])   Accelerated hypertension   Diabetes mellitus associated with pancreatic disease ([facility])   Hyponatremia   Hyperbilirubinemia    1. Necrotizing pancreatitis -  -NPO -IVF at 150 cc/hour (monitor for s/sx of fluid overload) -GI consult, likely to need ERCP -Monitor LFTs, CBC daily - if worsening, hyperbilirubinemia, likely to need ERCP sooner than Monday -Pain control if needed (currently denies pain) -N/V control with Zofran if needed (no issues currently) -Surgery is aware of patient but does not need to intervene/consult at this time; given large fluid collection, there may be a need for surgery at some point during this hospitalization -Zosyn 3.375 q6h for now  2. AKI  - -Volume deficiency associated with intrinsic losses from acute infection -Replete with LR at 150cc/hour for now -Monitor with BMP in AM  3. HTN - Continue Coreg and Clonidine  4. DM -  -Continue with Lantus -SSI q4h while NPO  5. Hyponatremia -  -Likely related to dehydration, will follow  6. Hyperbilirubinemia - -thought to be associated with retained stone -Tentative plan by GI for ERCP Monday -If bilirubin is increasing through the weekend, likely will need ERCP sooner.   DVT prophylaxis: Lovenox Code Status: Full  Family Communication: No family at bedside  Disposition Plan: Home once clinically improved Consults called: GI Admission status: Admit   Karmen Bongo MD [facility]  If 7PM-7AM, please contact night-coverage [email or web address] Password [email or web address]  01/29/2016, 7:10 PM

## 2016-01-30 ENCOUNTER — Inpatient Hospital Stay (HOSPITAL_COMMUNITY): Payer: Federal, State, Local not specified - PPO

## 2016-01-30 DIAGNOSIS — R059 Cough, unspecified: Secondary | ICD-10-CM | POA: Diagnosis present

## 2016-01-30 DIAGNOSIS — R05 Cough: Secondary | ICD-10-CM | POA: Diagnosis present

## 2016-01-30 LAB — COMPREHENSIVE METABOLIC PANEL
ALT: 123 U/L — ABNORMAL HIGH (ref 17–63)
AST: 79 U/L — ABNORMAL HIGH (ref 15–41)
Albumin: 2.1 g/dL — ABNORMAL LOW (ref 3.5–5.0)
Alkaline Phosphatase: 351 U/L — ABNORMAL HIGH (ref 38–126)
Anion gap: 9 (ref 5–15)
BUN: 11 mg/dL (ref 6–20)
CO2: 24 mmol/L (ref 22–32)
Calcium: 8.5 mg/dL — ABNORMAL LOW (ref 8.9–10.3)
Chloride: 100 mmol/L — ABNORMAL LOW (ref 101–111)
Creatinine, Ser: 1.27 mg/dL — ABNORMAL HIGH (ref 0.61–1.24)
GFR calc Af Amer: >60 mL/min (ref >60)
GFR calc non Af Amer: >60 mL/min (ref >60)
Glucose, Bld: 127 mg/dL — ABNORMAL HIGH (ref 65–99)
Potassium: 3.6 mmol/L (ref 3.5–5.1)
Sodium: 133 mmol/L — ABNORMAL LOW (ref 135–145)
Total Bilirubin: 9.8 mg/dL — ABNORMAL HIGH (ref 0.3–1.2)
Total Protein: 6.4 g/dL — ABNORMAL LOW (ref 6.5–8.1)

## 2016-01-30 LAB — GLUCOSE, CAPILLARY
Glucose-Capillary: 118 mg/dL — ABNORMAL HIGH (ref 65–99)
Glucose-Capillary: 125 mg/dL — ABNORMAL HIGH (ref 65–99)
Glucose-Capillary: 126 mg/dL — ABNORMAL HIGH (ref 65–99)
Glucose-Capillary: 144 mg/dL — ABNORMAL HIGH (ref 65–99)
Glucose-Capillary: 152 mg/dL — ABNORMAL HIGH (ref 65–99)
Glucose-Capillary: 153 mg/dL — ABNORMAL HIGH (ref 65–99)

## 2016-01-30 LAB — CBC
HCT: 24.1 % — ABNORMAL LOW (ref 39.0–52.0)
Hemoglobin: 7.6 g/dL — ABNORMAL LOW (ref 13.0–17.0)
MCH: 28.4 pg (ref 26.0–34.0)
MCHC: 31.5 g/dL (ref 30.0–36.0)
MCV: 89.9 fL (ref 78.0–100.0)
Platelets: 353 10*3/uL (ref 150–400)
RBC: 2.68 MIL/uL — ABNORMAL LOW (ref 4.22–5.81)
RDW: 15.1 % (ref 11.5–15.5)
WBC: 7 10*3/uL (ref 4.0–10.5)

## 2016-01-30 LAB — PROTIME-INR
INR: 1.42 (ref 0.00–1.49)
Prothrombin Time: 17.4 seconds — ABNORMAL HIGH (ref 11.6–15.2)

## 2016-01-30 MED ORDER — BENZONATATE 100 MG PO CAPS
100.0000 mg | ORAL_CAPSULE | Freq: Three times a day (TID) | ORAL | Status: DC | PRN
  Administered 2016-01-30: 100 mg via ORAL
  Filled 2016-01-30: qty 1

## 2016-01-30 MED ORDER — GUAIFENESIN-CODEINE 100-10 MG/5ML PO SOLN
5.0000 mL | Freq: Every evening | ORAL | Status: DC | PRN
  Administered 2016-01-30 – 2016-02-01 (×3): 5 mL via ORAL
  Filled 2016-01-30 (×3): qty 5

## 2016-01-30 NOTE — Progress Notes (Signed)
PROGRESS NOTE    Carolin CoyQuinten Hussar  UEA:[id number] DOB: 04-17-1976 DOA: 01/29/2016 PCP: Clayborn HeronVictoria R Rankins, MD  Outpatient Specialists: Bosie ClosSchooler: GI    Brief Narrative: Carolin CoyQuinten Goodlin is a 40 y.o. male with medical history significant of recent hospitalization for pancreatitis presenting with recurrence. He was admitted from 12/21/15-01/05/15 with acute severe necrotizing pancreatitis with shock/sepsis complicated by acute respiratory failure, transaminitis, anemia of critical illness, acute encephalopathy, AKI (Cr 1.36), protein-calorie malnutrition with severe hypoalbuminemia and hypokalemia. [facility] and was doing fine until his wife noticed he was yellow on Monday PM (6/25). Went to Dr. Luciana Axeankin on Tuesday (6/26) for check and was sent home without intervention; it got worse by Thursday and so she did blood work yesterday and repeat CT today. After the results came back, GI asked him to come back to the ER for admission.   Reports he has been feeling fine. No fevers at home. No abdominal pain. No n/v. Clay-colored formed stools today, regular BMs. Tires more easily than prior to hospitalization.    ED Course: Elevation LFTs, bili 9.8, normal lactate and lipase. CT with necrotizing pancreatitis with mass effect to central bile duct resulting in biliary duct dilation and GB distention. Made NPO, started IVF, started Zosyn.  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 Necrotizing pancreatitis -  -NPO after midnight for ERCP tomorrow, may have full liquids today -IVF at 150 cc/hour (monitor for s/sx of fluid overload) -GI is following, appreciate assistance -Monitor LFTs, CBC daily - somewhat improved today but needs ongoing IVF hydration as not yet back to  baseline -Pain control if needed (currently denies pain) -N/V control with Zofran if needed (no issues currently) -Surgery is aware of patient but does not need to intervene/consult at this time; given large fluid collection, there may be a need for surgery at some point during this hospitalization -Zosyn 3.375 q6h Day 2  2. AKI - -Volume deficiency associated with intrinsic losses from acute infection -Creatinine at hospital discharge on 614/ was 1.03; creatinine on admission on 6/30 was 1.37; currently 1.27 -Replete with LR at 150cc/hour for now -Monitor with BMP in AM  3. HTN - Continue Coreg and Clonidine  4. DM -  -Continue with Lantus -SSI q4h while NPO, qAC when eating  5. Hyponatremia -  -Likely related to dehydration, improving, will continue to follow  6. Hyperbilirubinemia - -thought to be associated with retained stone or obstructive jaundice from compression associated with large fluid collection -Tentative plan by GI for ERCP tomorrow -Bilirubin 9.8 on admission and today, but LFTs are improved today  7. HTN -  -well controlled  8. Cough -  -persistent but intermittent since prior hospitalization, somewhat productive today (whitish sputum) -will obtain PA/lateral CXR -Tessalon perles; Robitussin with codeine prn qhs    DVT prophylaxis: Lovenox Code Status: Full  Family Communication: wife present throughout evaluation Disposition Plan: Home once clinically improved Admission status: Admit  Consultants:   GI  Procedures:  ERCP scheduled for 7/2  Antimicrobials:   Zosyn 3.375 q6h, started on 6/30   Subjective: No significant change.  Feels hungry.  Not really having pain.  No fevers.  No n/v.  Does have persistent but intermittent cough since last hospitalization.  Not taking ACE-I.  Generally non-productive but he did have some whitish sputum today.  Objective: Filed Vitals:   01/29/16 1745 01/29/16 2109 01/29/16 2300 01/30/16 0513  BP:  117/71 124/61  115/58  Pulse: 80 86  81  Temp:   98.8 F (37.1 C) 99.6 F (37.6 C)  TempSrc:   Oral Oral  Resp: Height:    (1.803 m)   Weight:   119.5 kg (263 lb 7.2 oz)   SpO2: 96% 96%  95%    Intake/Output Summary (Last 24 hours) at 01/30/16 1407 Last data filed at 01/30/16 1016  Gross per 24 hour  Intake     30 ml  Output    300 ml  Net   -270 ml   Filed Weights   01/29/16 2300  Weight: 119.5 kg (263 lb 7.2 oz)    Examination:  General exam: Appears calm and comfortable  Respiratory system: Clear to auscultation but diminished breath sounds in L lung base. Respiratory effort normal. Cardiovascular system: S1 & S2 heard, RRR. No JVD, murmurs, rubs, gallops or clicks. No pedal edema. Gastrointestinal system: Abdomen is nondistended, soft and mildly tender particularly in mid-epigastric region. No organomegaly or masses felt. Normal bowel sounds heard. Central nervous system: Alert and oriented. No focal neurological deficits. Extremities: Symmetric 5 x 5 power. Skin: No rashes, lesions or ulcers Psychiatry: Judgement and insight appear normal. Mood & affect appropriate.     Data Reviewed: I have personally reviewed following labs and imaging studies  CBC:  Recent Labs Lab 01/29/16 1605 01/30/16 0532  WBC 10.4 7.0  HGB 8.6* 7.6*  HCT 27.0* 24.1*  MCV 90.3 89.9  PLT 401* 353   Basic Metabolic Panel:  Recent Labs Lab 01/29/16 1605 01/30/16 0532  NA 130* 133*  K 4.1 3.6  CL 97* 100*  CO2 25 24  GLUCOSE 185* 127*  BUN 15 11  CREATININE 1.37* 1.27*  CALCIUM 8.8* 8.5*   GFR: Estimated Creatinine Clearance: 100.7 mL/min (by C-G formula based on Cr of 1.27). Liver Function Tests:  Recent Labs Lab 01/29/16 1605 01/30/16 0532  AST 127* 79*  ALT 168* 123*  ALKPHOS 434* 351*  BILITOT 9.8* 9.8*  PROT 6.8 6.4*  ALBUMIN 2.4* 2.1*    Recent Labs Lab 01/29/16 1605  LIPASE 27   No results for input(s): AMMONIA in the last 168  hours. Coagulation Profile:  Recent Labs Lab 01/30/16 0532  INR 1.42   Cardiac Enzymes: No results for input(s): CKTOTAL, CKMB, CKMBINDEX, TROPONINI in the last 168 hours. BNP (last 3 results) No results for input(s): PROBNP in the last 8760 hours. HbA1C: No results for input(s): HGBA1C in the last 72 hours. CBG:  Recent Labs Lab 01/29/16 2002 01/30/16 0037 01/30/16 0411 01/30/16 0733 01/30/16 1146  GLUCAP 156* 144* 125* 126* 118*   Lipid Profile: No results for input(s): CHOL, HDL, LDLCALC, TRIG, CHOLHDL, LDLDIRECT in the last 72 hours. Thyroid Function Tests: No results for input(s): TSH, T4TOTAL, FREET4, T3FREE, THYROIDAB in the last 72 hours. Anemia Panel: No results for input(s): VITAMINB12, FOLATE, FERRITIN, TIBC, IRON, RETICCTPCT in the last 72 hours. Urine analysis: No results found for: COLORURINE, APPEARANCEUR, LABSPEC, PHURINE, GLUCOSEU, HGBUR, BILIRUBINUR, KETONESUR, PROTEINUR, UROBILINOGEN, NITRITE, LEUKOCYTESUR Sepsis Labs: (procalcitonin:4,lacticidven:4)  )No results found for this or any previous visit (from the past 240 hour(s)).       Radiology Studies: Ct Abdomen Pelvis W Contrast  01/29/2016  CLINICAL DATA:  Follow-up pancreatitis. EXAM: CT ABDOMEN AND PELVIS WITH CONTRAST TECHNIQUE: Multidetector CT imaging of the abdomen and pelvis  was performed using the standard protocol following bolus administration of intravenous contrast. CONTRAST:  125mL ISOVUE-300 IOPAMIDOL (ISOVUE-300) INJECTION 61% COMPARISON:  CT scans since Dec 21, 2015 with the most recent comparison from January 02, 2016 FINDINGS: Mild atelectasis in the left lung base. A small calcified granuloma is seen in the right lung base. Lung bases are otherwise unchanged and unremarkable. No free air. Necrotizing pancreatitis is again identified. The pancreatic body and tail are no longer visualized due to necrosis. There is a large peripancreatic fluid collection measuring 24 x 14 cm on  series 2, image 41. A few adjacent smaller collections are also seen such as anterior to the SMV on image 51. There is extensive fat stranding in the surrounding tissues. The peripancreatic fluid collection exerts mass effect on the distal common bile duct resulting in intra and extrahepatic biliary duct dilatation which is a new finding. No focal liver masses are identified. The hepatic veins are patent. There is narrowing of the portal vein as it passes by the peripancreatic collection. This narrowing is best seen on coronal images 49 through 55. No definitive portal vein thrombosis is identified at this time. The SMV remains patent. The splenic vein is no longer visualized. The gallbladder is markedly distended which correlates with the central common bile duct. The spleen is slightly more prominent in the interval but otherwise normal. Some collateral splenic vessels are identified near the splenic hilum. The adrenal glands and kidneys are normal. The abdominal aorta is normal in caliber with no atherosclerosis. No adenopathy identified. The peripancreatic fluid collection exerts mass effect on the second portion the duodenum without gastric distention. The small bowel is otherwise normal. The colon is normal. No secondary evidence of appendicitis. No adenopathy or mass in the pelvis. The bladder is normal. The prostate and seminal vesicles are unremarkable. Delayed images demonstrate no filling defects in the upper renal collecting systems. Visualized bones are unremarkable other than mild degenerative changes in the spine. IMPRESSION: 1. Necrotizing pancreatitis with interval development of a large 24 x 14 cm peripancreatic fluid collection. The fluid collection exerts mass effect on the central common bile duct resulting in intra and extrahepatic biliary duct dilatation and gallbladder distention, both of which are new findings. No blood flow is seen in the splenic vein. The peripancreatic collection exerts  mass effect on the portal vein as it passes the collection with significant narrowing but no definitive thrombosis. These results will be called to the ordering clinician or representative by the Radiologist Assistant, and communication documented in the PACS or zVision Dashboard. Electronically Signed   By: Gerome Samavid  Williams III M.D   On: 01/29/2016 13:44        Scheduled Meds: . carvedilol  25 mg Oral BID  . [START ON 02/05/2016] cloNIDine  0.2 mg Transdermal Weekly  . enoxaparin (LOVENOX) injection  40 mg Subcutaneous Q24H  . insulin aspart  0-20 Units Subcutaneous Q4H  . insulin glargine  50 Units Subcutaneous QHS  . piperacillin-tazobactam (ZOSYN)  IV  3.375 g Intravenous Q8H   Continuous Infusions: . lactated ringers 1,000 mL (01/30/16 0753)     LOS: 1 day    Time spent: 35 minutes    Jonah BlueJennifer Orton Capell, MD [facility]   If 7PM-7AM, please contact night-coverage [email or web address] Password [email or web address] 01/30/2016, 2:07 PM

## 2016-01-30 NOTE — Progress Notes (Addendum)
Patient ID: Timothy [name], male   DOB: 1975/08/12, 40 y.o.   MRN: [id number] [facility] Progress Note  Timothy [name] 40 y.o. 1975/08/12   Subjective: Feels ok. Denies abdominal pain, nausea, or vomiting.  Objective: Vital signs in last 24 hours: Filed Vitals:   01/29/16 2300 01/30/16 0513  BP:  115/58  Pulse:  81  Temp: 98.8 F (37.1 C) 99.6 F (37.6 C)  Resp:  28    Physical Exam: Gen: alert, no acute distress, morbidly obese HEENT: +scleral icterus CV: RRR Chest: CTA B Abd: minimal epigastric and RUQ tenderness with guarding, soft, nondistended, +BS  Lab Results:  Recent Labs  01/29/16 1605 01/30/16 0532  NA 130* 133*  K 4.1 3.6  CL 97* 100*  CO2 25 24  GLUCOSE 185* 127*  BUN 15 11  CREATININE 1.37* 1.27*  CALCIUM 8.8* 8.5*    Recent Labs  01/29/16 1605 01/30/16 0532  AST 127* 79*  ALT 168* 123*  ALKPHOS 434* 351*  BILITOT 9.8* 9.8*  PROT 6.8 6.4*  ALBUMIN 2.4* 2.1*    Recent Labs  01/29/16 1605 01/30/16 0532  WBC 10.4 7.0  HGB 8.6* 7.6*  HCT 27.0* 24.1*  MCV 90.3 89.9  PLT 401* 353    Recent Labs  01/30/16 0532  LABPROT 17.4*  INR 1.42      Assessment/Plan: Obstructive jaundice from a peripancreatic fluid collection in the setting of necrotizing pancreatitis in need of a biliary stent placement prior to discharge. TB remains elevated at 9.8. Other liver enzymes trending down. ERCP planned for tomorrow (01/31/16) by Dr. Elnoria HowardHung. Full liquid diet. NPO p MN.   Timothy [name] C. 01/30/2016, 1:18 PM  Pager [phone]  If no answer or after 5 PM call [phone]

## 2016-01-30 NOTE — Progress Notes (Signed)
Informed MD about Hgb level this morning (7.6). No new orders on this time. Will continue to monitor.

## 2016-01-30 NOTE — Anesthesia Preprocedure Evaluation (Addendum)
Anesthesia Evaluation  Patient identified by MRN, date of birth, ID band Patient awake    Reviewed: Allergy & Precautions, NPO status , Patient's Chart, lab work & pertinent test results, reviewed documented beta blocker date and time   History of Anesthesia Complications Negative for: history of anesthetic complications  Airway Mallampati: I  TM Distance: >3 FB Neck ROM: Full    Dental  (+) Teeth Intact, Dental Advisory Given   Pulmonary neg pulmonary ROS,    Pulmonary exam normal        Cardiovascular hypertension, Pt. on medications and Pt. on home beta blockers Normal cardiovascular exam     Neuro/Psych PSYCHIATRIC DISORDERS Anxiety negative neurological ROS     GI/Hepatic   Endo/Other  diabetesMorbid obesity  Renal/GU Renal disease     Musculoskeletal   Abdominal   Peds  Hematology   Anesthesia Other Findings   Reproductive/Obstetrics                           Anesthesia Physical Anesthesia Plan  ASA: III  Anesthesia Plan: General   Post-op Pain Management:    Induction: Intravenous  Airway Management Planned: Oral ETT  Additional Equipment:   Intra-op Plan:   Post-operative Plan: Extubation in OR  Informed Consent: I have reviewed the patients History and Physical, chart, labs and discussed the procedure including the risks, benefits and alternatives for the proposed anesthesia with the patient or authorized representative who has indicated his/her understanding and acceptance.   Dental advisory given  Plan Discussed with: CRNA, Anesthesiologist and Surgeon  Anesthesia Plan Comments:        Anesthesia Quick Evaluation

## 2016-01-31 ENCOUNTER — Encounter (HOSPITAL_COMMUNITY)
Admission: EM | Disposition: A | Discharge status: Home / Self Care | Payer: Self-pay | Source: Home / Self Care | Attending: Internal Medicine

## 2016-01-31 ENCOUNTER — Inpatient Hospital Stay (HOSPITAL_COMMUNITY): Payer: Federal, State, Local not specified - PPO | Admitting: Anesthesiology

## 2016-01-31 ENCOUNTER — Inpatient Hospital Stay (HOSPITAL_COMMUNITY): Payer: Federal, State, Local not specified - PPO

## 2016-01-31 ENCOUNTER — Encounter (HOSPITAL_COMMUNITY): Payer: Self-pay | Admitting: *Deleted

## 2016-01-31 HISTORY — PX: ERCP: SHX5425

## 2016-01-31 LAB — COMPREHENSIVE METABOLIC PANEL
ALT: 91 U/L — ABNORMAL HIGH (ref 17–63)
AST: 71 U/L — ABNORMAL HIGH (ref 15–41)
Albumin: 1.8 g/dL — ABNORMAL LOW (ref 3.5–5.0)
Alkaline Phosphatase: 307 U/L — ABNORMAL HIGH (ref 38–126)
Anion gap: 9 (ref 5–15)
BUN: 9 mg/dL (ref 6–20)
CO2: 25 mmol/L (ref 22–32)
Calcium: 8.2 mg/dL — ABNORMAL LOW (ref 8.9–10.3)
Chloride: 101 mmol/L (ref 101–111)
Creatinine, Ser: 1.16 mg/dL (ref 0.61–1.24)
GFR calc Af Amer: >60 mL/min (ref >60)
GFR calc non Af Amer: >60 mL/min (ref >60)
Glucose, Bld: 74 mg/dL (ref 65–99)
Potassium: 3.4 mmol/L — ABNORMAL LOW (ref 3.5–5.1)
Sodium: 135 mmol/L (ref 135–145)
Total Bilirubin: 9.5 mg/dL — ABNORMAL HIGH (ref 0.3–1.2)
Total Protein: 5.9 g/dL — ABNORMAL LOW (ref 6.5–8.1)

## 2016-01-31 LAB — CBC WITH DIFFERENTIAL/PLATELET
Basophils Absolute: 0 10*3/uL (ref 0.0–0.1)
Basophils Relative: 0 %
Eosinophils Absolute: 0.1 10*3/uL (ref 0.0–0.7)
Eosinophils Relative: 1 %
HCT: 22.8 % — ABNORMAL LOW (ref 39.0–52.0)
Hemoglobin: 7.3 g/dL — ABNORMAL LOW (ref 13.0–17.0)
Lymphocytes Relative: 11 %
Lymphs Abs: 0.6 10*3/uL — ABNORMAL LOW (ref 0.7–4.0)
MCH: 28.9 pg (ref 26.0–34.0)
MCHC: 32 g/dL (ref 30.0–36.0)
MCV: 90.1 fL (ref 78.0–100.0)
Monocytes Absolute: 1 10*3/uL (ref 0.1–1.0)
Monocytes Relative: 17 %
Neutro Abs: 4.3 10*3/uL (ref 1.7–7.7)
Neutrophils Relative %: 71 %
Platelets: 367 10*3/uL (ref 150–400)
RBC: 2.53 MIL/uL — ABNORMAL LOW (ref 4.22–5.81)
RDW: 15 % (ref 11.5–15.5)
WBC: 5.9 10*3/uL (ref 4.0–10.5)

## 2016-01-31 LAB — GLUCOSE, CAPILLARY
Glucose-Capillary: 101 mg/dL — ABNORMAL HIGH (ref 65–99)
Glucose-Capillary: 71 mg/dL (ref 65–99)
Glucose-Capillary: 75 mg/dL (ref 65–99)
Glucose-Capillary: 77 mg/dL (ref 65–99)
Glucose-Capillary: 89 mg/dL (ref 65–99)
Glucose-Capillary: 95 mg/dL (ref 65–99)
Glucose-Capillary: 98 mg/dL (ref 65–99)
Glucose-Capillary: 99 mg/dL (ref 65–99)

## 2016-01-31 LAB — PROTIME-INR
INR: 1.4 (ref 0.00–1.49)
Prothrombin Time: 17.3 seconds — ABNORMAL HIGH (ref 11.6–15.2)

## 2016-01-31 SURGERY — ERCP, WITH INTERVENTION IF INDICATED
Anesthesia: General

## 2016-01-31 MED ORDER — LIDOCAINE HCL (CARDIAC) 20 MG/ML IV SOLN
INTRAVENOUS | Status: DC | PRN
  Administered 2016-01-31: 100 mg via INTRATRACHEAL

## 2016-01-31 MED ORDER — SUCCINYLCHOLINE CHLORIDE 200 MG/10ML IV SOSY
PREFILLED_SYRINGE | INTRAVENOUS | Status: AC
Start: 2016-01-31 — End: 2016-01-31
  Filled 2016-01-31: qty 10

## 2016-01-31 MED ORDER — FENTANYL CITRATE (PF) 250 MCG/5ML IJ SOLN
INTRAMUSCULAR | Status: AC
  Filled 2016-01-31: qty 5

## 2016-01-31 MED ORDER — ONDANSETRON HCL 4 MG/2ML IJ SOLN
INTRAMUSCULAR | Status: AC
  Filled 2016-01-31: qty 2

## 2016-01-31 MED ORDER — PROMETHAZINE HCL 25 MG/ML IJ SOLN: 6.2500 mg | INTRAMUSCULAR | Status: DC | PRN

## 2016-01-31 MED ORDER — INDOMETHACIN 50 MG RE SUPP
RECTAL | Status: DC | PRN
Start: 2016-01-31 — End: 2016-01-31
  Administered 2016-01-31: 50 mg via RECTAL

## 2016-01-31 MED ORDER — DEXTROSE 50 % IV SOLN
25.0000 mL | Freq: Once | INTRAVENOUS | Status: AC
  Administered 2016-02-01: 25 mL via INTRAVENOUS
  Filled 2016-01-31: qty 50

## 2016-01-31 MED ORDER — INDOMETHACIN 50 MG RE SUPP
RECTAL | Status: AC
  Filled 2016-01-31: qty 2

## 2016-01-31 MED ORDER — EPHEDRINE SULFATE 50 MG/ML IJ SOLN
INTRAMUSCULAR | Status: DC | PRN
  Administered 2016-01-31 (×2): 15 mg via INTRAVENOUS

## 2016-01-31 MED ORDER — ONDANSETRON HCL 4 MG/2ML IJ SOLN
INTRAMUSCULAR | Status: DC | PRN
  Administered 2016-01-31: 4 mg via INTRAVENOUS

## 2016-01-31 MED ORDER — SUCCINYLCHOLINE CHLORIDE 20 MG/ML IJ SOLN
INTRAMUSCULAR | Status: DC | PRN
  Administered 2016-01-31: 140 mg via INTRAVENOUS

## 2016-01-31 MED ORDER — SODIUM CHLORIDE 0.9 % IV SOLN: INTRAVENOUS | Status: DC

## 2016-01-31 MED ORDER — IOPAMIDOL (ISOVUE-300) INJECTION 61%
INTRAVENOUS | Status: DC | PRN
  Administered 2016-01-31: 10 mL via INTRAVENOUS

## 2016-01-31 MED ORDER — PHENYLEPHRINE HCL 10 MG/ML IJ SOLN
INTRAMUSCULAR | Status: DC | PRN
  Administered 2016-01-31: 120 ug via INTRAVENOUS

## 2016-01-31 MED ORDER — PROPOFOL 10 MG/ML IV BOLUS
INTRAVENOUS | Status: AC
  Filled 2016-01-31: qty 20

## 2016-01-31 MED ORDER — MIDAZOLAM HCL 2 MG/2ML IJ SOLN
INTRAMUSCULAR | Status: AC
  Filled 2016-01-31: qty 2

## 2016-01-31 MED ORDER — PHENYLEPHRINE HCL 10 MG/ML IJ SOLN
10.0000 mg | INTRAVENOUS | Status: DC | PRN
  Administered 2016-01-31: 60 ug/min via INTRAVENOUS

## 2016-01-31 MED ORDER — HYDROMORPHONE HCL 1 MG/ML IJ SOLN: 0.2500 mg | INTRAMUSCULAR | Status: DC | PRN

## 2016-01-31 MED ORDER — POTASSIUM CHLORIDE 10 MEQ/100ML IV SOLN
10.0000 meq | INTRAVENOUS | Status: AC
  Administered 2016-01-31 (×4): 10 meq via INTRAVENOUS
  Filled 2016-01-31 (×4): qty 100

## 2016-01-31 MED ORDER — IOPAMIDOL (ISOVUE-300) INJECTION 61%
INTRAVENOUS | Status: AC
  Filled 2016-01-31: qty 50

## 2016-01-31 MED ORDER — PROPOFOL 10 MG/ML IV BOLUS
INTRAVENOUS | Status: DC | PRN
  Administered 2016-01-31: 150 mg via INTRAVENOUS
  Administered 2016-01-31: 20 mg via INTRAVENOUS

## 2016-01-31 MED ORDER — MIDAZOLAM HCL 2 MG/2ML IJ SOLN
INTRAMUSCULAR | Status: DC | PRN
  Administered 2016-01-31: 2 mg via INTRAVENOUS

## 2016-01-31 MED ORDER — LIDOCAINE 2% (20 MG/ML) 5 ML SYRINGE
INTRAMUSCULAR | Status: AC
  Filled 2016-01-31: qty 5

## 2016-01-31 MED ORDER — DEXTROSE 50 % IV SOLN
25.0000 mL | Freq: Once | INTRAVENOUS | Status: AC
  Administered 2016-01-31: 25 mL via INTRAVENOUS
  Filled 2016-01-31: qty 50

## 2016-01-31 MED ORDER — LACTATED RINGERS IV SOLN
INTRAVENOUS | Status: DC | PRN
  Administered 2016-01-31 (×2): via INTRAVENOUS

## 2016-01-31 NOTE — Anesthesia Procedure Notes (Signed)
Procedure Name: Intubation Date/Time: 01/31/2016 7:26 AM Performed by: Alanda AmassFRIEDMAN, Yalitza Teed A Pre-anesthesia Checklist: Patient identified, Emergency Drugs available, Suction available, Patient being monitored and Timeout performed Patient Re-evaluated:Patient Re-evaluated prior to inductionOxygen Delivery Method: Circle System Utilized and Circle system utilized Preoxygenation: Pre-oxygenation with 100% oxygen Intubation Type: IV induction Laryngoscope Size: Mac and 3 Grade View: Grade I Tube type: Oral Tube size: 7.5 mm Number of attempts: 1 Airway Equipment and Method: Stylet Placement Confirmation: ETT inserted through vocal cords under direct vision,  positive ETCO2 and breath sounds checked- equal and bilateral Secured at: 23 cm Tube secured with: Tape Dental Injury: Teeth and Oropharynx as per pre-operative assessment

## 2016-01-31 NOTE — Progress Notes (Signed)
Notified on call in regards to patient having a GBS of 71 and being NPO for procedure. See MAR for new orders

## 2016-01-31 NOTE — Transfer of Care (Signed)
Immediate Anesthesia Transfer of Care Note  Patient: Timothy [name]  Procedure(s) Performed: Procedure(s): ENDOSCOPIC RETROGRADE CHOLANGIOPANCREATOGRAPHY (ERCP) (N/A)  Patient Location: PACU  Anesthesia Type:General  Level of Consciousness: awake  Airway & Oxygen Therapy: Patient Spontanous Breathing and Patient connected to nasal cannula oxygen  Post-op Assessment: Report given to RN and Post -op Vital signs reviewed and stable  Post vital signs: Reviewed and stable  Last Vitals:  Filed Vitals:   01/31/16 0437 01/31/16 0700  BP: 111/56 118/60  Pulse: 75 76  Temp: 38 C 37.1 C  Resp:  18    Last Pain:  Filed Vitals:   01/31/16 0705  PainSc: 0-No pain         Complications: No apparent anesthesia complications

## 2016-01-31 NOTE — Progress Notes (Signed)
Pacient back from ERCP; alert and oriented x 3; complained of abdominal pain (6/10). He received PRN pain medicine with good effect. Will continue to monitor.

## 2016-01-31 NOTE — Progress Notes (Signed)
PROGRESS NOTE   Timothy CoyQuinten Jelinski ZOX:[id number] DOB: 09/24/1975 DOA: 01/29/2016 PCP: Clayborn HeronVictoria R Rankins, MD Outpatient Specialists:Schooler: GI    Brief Narrative: Timothy [name] is a 40 y.o. male with medical history significant of recent hospitalization for pancreatitis presenting with recurrence. He was admitted from 12/21/15-01/05/15 with acute severe necrotizing pancreatitis with shock/sepsis complicated by acute respiratory failure, transaminitis, anemia of critical illness, acute encephalopathy, AKI (Cr 1.36), protein-calorie malnutrition with severe hypoalbuminemia and hypokalemia. [facility] was [facility] on 6/6 and was doing fine until his wife noticed he was yellow on Monday PM (6/25). Went to Dr. Luciana Axeankin on Tuesday (6/26) for check and was sent home without intervention; it got worse by Thursday and so she did blood work yesterday and repeat CT today. After the results came back, GI asked him to come back to the ER for admission.   Reports he has been feeling fine. No fevers at home. No abdominal pain. No n/v. Clay-colored formed stools today, regular BMs. Tires more easily than prior to hospitalization.    ED Course: Elevation LFTs, bili 9.8, normal lactate and lipase. CT with necrotizing pancreatitis with mass effect to central bile duct resulting in biliary duct dilation and GB distention. Made NPO, started IVF, started Zosyn.  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)  Hyponatremia  Hyperbilirubinemia    Necrotizing pancreatitis  -IVF at 150 cc/hour (monitor for s/sx of fluid overload) -Monitor LFTs, CBC daily - somewhat improved today but needs ongoing IVF hydration as not yet back to baseline -Pain control prn -N/V control with Zofran prn -Surgery is aware of patient but does not  need to intervene/consult at this time; given large fluid collection, there may be a need for surgery at some point during this hospitalization -Zosyn 3.375 q6h Day 2 -GI has been following since admission; ERCP on 7/2 which was technically difficult and complex due to abnormal anatomy; one 8.5 Fr x 9 cm plastic stent was placed 8 cm into the CBD  Anemia - Hgb 9.8 at prior hospital discharge, 8.6 on admission this time - Hgb lower 7/1 and 7.3 today.  This may be dilutional in nature but is lower than on prior admission and worsening.  No evidence of bleeding. - Pancreatic fluid collection is large, 24x14 cm.  Discussed with Dr. Micheline MazeBoyle from radiology to ensure that fluid collection is not blood as source for anemia.  Density measurements are consistent with fluid and not fresh blood.  He feels fairly certain that this is not a hemorrhage. - Discussed with family today, would transfuse for Hgb <7 but otherwise prefer to avoid unnecessary intervention given tenuous course thus far. - Continue with daily CBC for now.  AKI -Volume deficiency associated with intrinsic losses from acute infection -Creatinine at hospital discharge on 614/ was 1.03; creatinine on admission on 6/30 was 1.37; currently 1.27 -Replete with LR at 150cc/hour for now -Monitor with BMP in AM  HTN  - Continue Coreg and Clonidine  DM  -Continue with Lantus -SSI q4h while NPO, qAC when eating  Hyponatremia  -Likely related to dehydration, resolved  Hypokalemia - Will replete via IV today - Recheck BMP tomorrow  Hyperbilirubinemia - thought to be associated with obstructive jaundice from compression associated with large fluid collection - ERCP today - Bilirubin 9.8 on admission, currently 9.5 - Continue to follow daily  HTN  -well controlled  Cough  -persistent  but intermittent since prior hospitalization, not mentioned today -PA/lateral CXR with probable atelectasis but possible infiltrate - if febrile, SOB,  etc consider empiric antibiotic coverage for HCAP -Tessalon perles; Robitussin with codeine prn qhs    DVT prophylaxis: Lovenox Code Status: Full  Family Communication: wife present throughout evaluation Disposition Plan: Home once clinically improved Admission status: Admit  Consultants:   GI  Procedures:  ERCP 7/2  Antimicrobials:   Zosyn 3.375 q6h, started on 6/30  Subjective: Patient sleepy following procedure and complaining of mild abdominal pain - given pain medication while I was present and then extremely comfortable and in NAD.  Objective: Filed Vitals:   01/31/16 0940 01/31/16 0947 01/31/16 0948 01/31/16 1423  BP: 101/61   100/59  Pulse: 78 77 78 73  Temp:   98.8 F (37.1 C) 98.6 F (37 C)  TempSrc:      Resp: Height:      Weight:      SpO2: 90% 94% 87% 97%    Intake/Output Summary (Last 24 hours) at 01/31/16 1546 Last data filed at 01/31/16 0831  Gross per 24 hour  Intake   1000 ml  Output      0 ml  Net   1000 ml   Filed Weights   01/29/16 2300  Weight: 119.5 kg (263 lb 7.2 oz)    Examination:  General exam: Appears calm and comfortable, still quite jaundiced, somnolent but appropriate following procedure Respiratory system: Clear to auscultation. Respiratory effort normal. Cardiovascular system: S1 & S2 heard, RRR. No JVD, murmurs, rubs, gallops or clicks. No pedal edema. Gastrointestinal system: Abdomen is nondistended, soft and nontender. No organomegaly or masses felt. Normal bowel sounds heard. Central nervous system: Alert and oriented. No focal neurological deficits. Extremities: Symmetric 5 x 5 power. Skin: No rashes, lesions or ulcers.  Homero Fellers jaundice     Data Reviewed: I have personally reviewed following labs and imaging studies  CBC:  Recent Labs Lab 01/29/16 1605 01/30/16 0532 01/31/16 0437  WBC 10.4 7.0 5.9  NEUTROABS  --   --  4.3  HGB 8.6* 7.6* 7.3*  HCT 27.0* 24.1* 22.8*  MCV 90.3 89.9 90.1    PLT 401* 353 367   Basic Metabolic Panel:  Recent Labs Lab 01/29/16 1605 01/30/16 0532 01/31/16 0437  NA 130* 133* 135  K 4.1 3.6 3.4*  CL 97* 100* 101  CO2 GLUCOSE 185* 127* 74  BUN CREATININE 1.37* 1.27* 1.16  CALCIUM 8.8* 8.5* 8.2*   GFR: Estimated Creatinine Clearance: 110.2 mL/min (by C-G formula based on Cr of 1.16). Liver Function Tests:  Recent Labs Lab 01/29/16 1605 01/30/16 0532 01/31/16 0437  AST 127* 79* 71*  ALT 168* 123* 91*  ALKPHOS 434* 351* 307*  BILITOT 9.8* 9.8* 9.5*  PROT 6.8 6.4* 5.9*  ALBUMIN 2.4* 2.1* 1.8*    Recent Labs Lab 01/29/16 1605  LIPASE 27   No results for input(s): AMMONIA in the last 168 hours. Coagulation Profile:  Recent Labs Lab 01/30/16 0532 01/31/16 0437  INR 1.42 1.40   Cardiac Enzymes: No results for input(s): CKTOTAL, CKMB, CKMBINDEX, TROPONINI in the last 168 hours. BNP (last 3 results) No results for input(s): PROBNP in the last 8760 hours. HbA1C: No results for input(s): HGBA1C in the last 72 hours. CBG:  Recent Labs Lab 01/31/16 0004 01/31/16 0435 01/31/16 0712 01/31/16 0846 01/31/16 1248  GLUCAP 98 71 99 101* 95  Lipid Profile: No results for input(s): CHOL, HDL, LDLCALC, TRIG, CHOLHDL, LDLDIRECT in the last 72 hours. Thyroid Function Tests: No results for input(s): TSH, T4TOTAL, FREET4, T3FREE, THYROIDAB in the last 72 hours. Anemia Panel: No results for input(s): VITAMINB12, FOLATE, FERRITIN, TIBC, IRON, RETICCTPCT in the last 72 hours. Urine analysis: No results found for: COLORURINE, APPEARANCEUR, LABSPEC, PHURINE, GLUCOSEU, HGBUR, BILIRUBINUR, KETONESUR, PROTEINUR, UROBILINOGEN, NITRITE, LEUKOCYTESUR Sepsis Labs: @LABRCNTIP (procalcitonin:4,lacticidven:4)  )No results found for this or any previous visit (from the past 240 hour(s)).       Radiology Studies: Dg Chest 2 View  01/30/2016  CLINICAL DATA:  Cough EXAM: CHEST  2 VIEW COMPARISON:  01/13/2016 FINDINGS:  Heart size and vascularity within normal limits. Mild atelectasis or scarring in the lung bases. Right perihilar atelectasis has resolved. 1 cm nodular density below the left hilum is similar to the prior study and may be due to atelectasis overlying blood vessels. No pleural effusion. Negative for edema. IMPRESSION: Bibasilar atelectasis/ infiltrate. Left lower hilar density likely due to airspace disease. No mass lesion in this area on recent chest CT 12/21/2015 Electronically Signed   By: Marlan Palauharles  Clark M.D.   On: 01/30/2016 16:13   Dg Ercp  01/31/2016  CLINICAL DATA:  Biliary obstruction EXAM: ERCP TECHNIQUE: Multiple spot images obtained with the fluoroscopic device and submitted for interpretation post-procedure. FLUOROSCOPY TIME:  3 minutes 29 seconds COMPARISON:  CT 01/29/2016 FINDINGS: Two spot images document endoscopy. A plastic stent extends from the proximal CBD across the expected level of the ampulla. There is opacification of the proximal CBD, which is not visualized in the mid and distal segments. Intrahepatic ducts are incompletely opacified and visualized. IMPRESSION: Plastic biliary stent placement across the distal CBD. These images were submitted for radiologic interpretation only. Please see the procedural report for the amount of contrast and the fluoroscopy time utilized. Electronically Signed   By: Corlis Leak  Hassell M.D.   On: 01/31/2016 09:13        Scheduled Meds: . carvedilol  25 mg Oral BID  . [START ON 02/05/2016] cloNIDine  0.2 mg Transdermal Weekly  . enoxaparin (LOVENOX) injection  40 mg Subcutaneous Q24H  . insulin aspart  0-20 Units Subcutaneous Q4H  . insulin glargine  50 Units Subcutaneous QHS  . piperacillin-tazobactam (ZOSYN)  IV  3.375 g Intravenous Q8H   Continuous Infusions: . lactated ringers 1,000 mL (01/31/16 1340)     LOS: 2 days    Time spent: 35 minutes    Jonah BlueJennifer Shanyiah Conde, MD [facility]   If 7PM-7AM, please contact  night-coverage [email or web address] Password [email or web address] 01/31/2016, 3:46 PM

## 2016-01-31 NOTE — Progress Notes (Signed)
Notified on call provider in regards to patient GBS being 71. Patiently currently NPO except sips of water. Inquired about any interventions to keep patient from hypoglycemia. Awaiting return page.

## 2016-01-31 NOTE — Op Note (Signed)
[facility] Patient Name: Carolin CoyQuinten Shetley Procedure Date : 01/31/2016 MRN: [id number] Attending MD: Jeani HawkingPatrick Wanya Bangura , MD Date of Birth: [birth year] CSN: [id number] Age: [id number] Admit Type: Inpatient Procedure:                ERCP Indications:              Biliary dilation on Ultrasound Providers:                Jeani HawkingPatrick Adriell Polansky, MD, Harold BarbanLiz Honeycutt, RN, Clearnce SorrelKatie Smith,                            Technician Referring MD:              Medicines:                General Anesthesia Complications:            No immediate complications. Estimated Blood Loss:     Estimated blood loss: none. Procedure:                Pre-Anesthesia Assessment:                           - Prior to the procedure, a History and Physical                            was performed, and patient medications and                            allergies were reviewed. The patient's tolerance of                            previous anesthesia was also reviewed. The risks                            and benefits of the procedure and the sedation                            options and risks were discussed with the patient.                            All questions were answered, and informed consent                            was obtained. Prior Anticoagulants: The patient has                            taken no previous anticoagulant or antiplatelet                            agents. ASA Grade Assessment: III - A patient with                            severe systemic disease. After reviewing the risks                            and  benefits, the patient was deemed in                            satisfactory condition to undergo the procedure.                           - Sedation was administered by an anesthesia                            professional. General anesthesia was attained.                           After obtaining informed consent, the scope was                            passed under direct vision. Throughout the                       procedure, the patient's blood pressure, pulse, and                            oxygen saturations were monitored continuously. The                            [id number]-[id number]) scope was introduced through                            the mouth, and used to inject contrast into and                            used to inject contrast into the bile duct. The                            ERCP was technically difficult and complex due to                            abnormal anatomy. The patient tolerated the                            procedure well. Scope In: Scope Out: Findings:      One 8.5 Fr by 9 cm plastic stent with a single external flap and a       single internal flap was placed 8 cm into the common bile duct. Bile       flowed through the stent. The stent was in good position.      In the duodenal bulb there was extrinsic compression from the pancreatic       fluid collection. Maneuvering through this area was difficult, but with       gentle torquing and insertion, the duodenoscope was able to pass into       the third portion of the duodenum. Slow withdrawal did reveal the       ampulla, but it was difficult to visualize from the extrinsic       compression. The CBD was cannulated during the first attempt and no       cannulation  of the PD occurred. The guidewire was secured in the left       intrahepatic ducts. Contrast injection revealed a very long smooth       stricture and the proximal CBD as well as the intrahepatic ducts were       markedly dilated. The stricture was measured between 8-10 cm. Because of       the poor visualization and the extrinsic compression, a sphincterotomy       was not able to be safely performed. Unwanted mucosa crowded and       collapsed onto the sphincterotome risking unintended mucosal injury. An       8.5 x 9 cm stent was selected and easily positioned across the extrinsic       compression. Good bile flow was achieved, however,  the deployment       mechanism failed and the stent had to be completely removed.       Recannulation was performed and achieved with some effort, but without       PD cannulation. A new 8.5 Fr x 9 cm stent was successfully deployed and       bile as well as some sludge drained. Impression:               - One plastic stent was placed into the common bile                            duct. Recommendation:           - Return patient to hospital ward for ongoing care. Procedure Code(s):        --- Professional ---                           [id number], Endoscopic retrograde                            cholangiopancreatography (ERCP); with placement of                            endoscopic stent into biliary or pancreatic duct,                            including pre- and post-dilation and guide wire                            passage, when performed, including sphincterotomy,                            when performed, each stent Diagnosis Code(s):        --- Professional ---                           K83.8, Other specified diseases of biliary tract CPT copyright 2016 American Medical Association. All rights reserved. The codes documented in this report are preliminary and upon coder review may  be revised to meet current compliance requirements. Jeani HawkingPatrick Sani Madariaga, MD Jeani HawkingPatrick Evadene Wardrip, MD 01/31/2016 8:49:53 AM This report has been signed electronically. Number of Addenda: 0

## 2016-01-31 NOTE — Anesthesia Postprocedure Evaluation (Signed)
Anesthesia Post Note  Patient: [name]  Procedure(s) Performed: Procedure(s) (LRB): ENDOSCOPIC RETROGRADE CHOLANGIOPANCREATOGRAPHY (ERCP) (N/A)  Patient location during evaluation: PACU Anesthesia Type: General Level of consciousness: sedated Pain management: pain level controlled Vital Signs Assessment: post-procedure vital signs reviewed and stable Respiratory status: spontaneous breathing and respiratory function stable Cardiovascular status: stable Anesthetic complications: no    Last Vitals:  Filed Vitals:   01/31/16 0855 01/31/16 0910  BP: 99/55 97/57  Pulse: 76 77  Temp:    Resp: 26 30    Last Pain:  Filed Vitals:   01/31/16 0921  PainSc: 0-No pain                 Mica Releford DANIEL

## 2016-02-01 DIAGNOSIS — E876 Hypokalemia: Secondary | ICD-10-CM

## 2016-02-01 DIAGNOSIS — R7989 Other specified abnormal findings of blood chemistry: Secondary | ICD-10-CM

## 2016-02-01 LAB — CBC WITH DIFFERENTIAL/PLATELET
Basophils Absolute: 0 10*3/uL (ref 0.0–0.1)
Basophils Relative: 0 %
Eosinophils Absolute: 0.1 10*3/uL (ref 0.0–0.7)
Eosinophils Relative: 1 %
HCT: 23.4 % — ABNORMAL LOW (ref 39.0–52.0)
Hemoglobin: 7.4 g/dL — ABNORMAL LOW (ref 13.0–17.0)
Lymphocytes Relative: 8 %
Lymphs Abs: 0.5 10*3/uL — ABNORMAL LOW (ref 0.7–4.0)
MCH: 29 pg (ref 26.0–34.0)
MCHC: 31.6 g/dL (ref 30.0–36.0)
MCV: 91.8 fL (ref 78.0–100.0)
Monocytes Absolute: 0.9 10*3/uL (ref 0.1–1.0)
Monocytes Relative: 15 %
Neutro Abs: 4.7 10*3/uL (ref 1.7–7.7)
Neutrophils Relative %: 77 %
Platelets: 333 10*3/uL (ref 150–400)
RBC: 2.55 MIL/uL — ABNORMAL LOW (ref 4.22–5.81)
RDW: 15.5 % (ref 11.5–15.5)
WBC: 6.1 10*3/uL (ref 4.0–10.5)

## 2016-02-01 LAB — GLUCOSE, CAPILLARY
Glucose-Capillary: 100 mg/dL — ABNORMAL HIGH (ref 65–99)
Glucose-Capillary: 106 mg/dL — ABNORMAL HIGH (ref 65–99)
Glucose-Capillary: 109 mg/dL — ABNORMAL HIGH (ref 65–99)
Glucose-Capillary: 109 mg/dL — ABNORMAL HIGH (ref 65–99)
Glucose-Capillary: 135 mg/dL — ABNORMAL HIGH (ref 65–99)
Glucose-Capillary: 163 mg/dL — ABNORMAL HIGH (ref 65–99)
Glucose-Capillary: 176 mg/dL — ABNORMAL HIGH (ref 65–99)
Glucose-Capillary: 68 mg/dL (ref 65–99)
Glucose-Capillary: 69 mg/dL (ref 65–99)

## 2016-02-01 LAB — COMPREHENSIVE METABOLIC PANEL
ALT: 62 U/L (ref 17–63)
AST: 33 U/L (ref 15–41)
Albumin: 1.9 g/dL — ABNORMAL LOW (ref 3.5–5.0)
Alkaline Phosphatase: 257 U/L — ABNORMAL HIGH (ref 38–126)
Anion gap: 18 — ABNORMAL HIGH (ref 5–15)
BUN: 8 mg/dL (ref 6–20)
CO2: 25 mmol/L (ref 22–32)
Calcium: 9.2 mg/dL (ref 8.9–10.3)
Chloride: 95 mmol/L — ABNORMAL LOW (ref 101–111)
Creatinine, Ser: 1.24 mg/dL (ref 0.61–1.24)
GFR calc Af Amer: >60 mL/min (ref >60)
GFR calc non Af Amer: >60 mL/min (ref >60)
Glucose, Bld: 112 mg/dL — ABNORMAL HIGH (ref 65–99)
Potassium: 4.6 mmol/L (ref 3.5–5.1)
Sodium: 138 mmol/L (ref 135–145)
Total Bilirubin: 4.9 mg/dL — ABNORMAL HIGH (ref 0.3–1.2)
Total Protein: 5.6 g/dL — ABNORMAL LOW (ref 6.5–8.1)

## 2016-02-01 MED ORDER — DEXTROSE 50 % IV SOLN
INTRAVENOUS | Status: AC
  Administered 2016-02-01: 25 mL
  Filled 2016-02-01: qty 50

## 2016-02-01 NOTE — Progress Notes (Signed)
Patient ID: Timothy [name], male   DOB: 10-May-1976, 40 y.o.   MRN: [id number]  PROGRESS NOTE    Timothy CoyQuinten Janeway  [id number] DOB: 10-May-1976 DOA: 01/29/2016  PCP: Clayborn HeronVictoria R Rankins, MD   Brief Narrative:  40 y.o. male with past  medical history significant for recent hospitalization for pancreatitis, admitted from 12/21/15-01/05/15 with acute severe necrotizing pancreatitis with shock/sepsis complicated by acute respiratory failure, transaminitis, anemia of critical illness, acute encephalopathy, AKI (Cr 1.36), protein-calorie malnutrition with severe hypoalbuminemia and hypokalemia. [facility] and was doing fine until his wife reported more jaundice 01/24/2016. He was seen by Dr. Luciana Axeankin on outpt basis, had CT scan done. Once results were in patient was called to come back to ER for evaluation.  On admission, liver function enzymes were elevated along with bilirubin of 9.8. CT abdomen / pelvis with contrast showed necrotizing pancreatitis with mass effect to central bile duct resulting in biliary duct dilation and gallbladder distention. Patient was seen by GI in consultation and underwent ERCP 01/31/2016. Plastic biliary stent placed across distal CBD.  Assessment/Plan  Necrotizing pancreatitis / recurrent pancreatitis / Abnormal LFT's - Thought to be from gallstones however no alcohol level obtained at the time of this admission. No alcohol level available in Epic even for prior admission. His lipid panel however was within normal limits. Patient denies alcohol use / abuse - CT abdomen/pelvis demonstrated necrotizing pancreatitis with mass effect to central bile duct resulting in biliary duct dilation and gallbladder distention - Patient is status post ERCP with plastic biliary stent placement across distal CBD - Continue Zosyn - Appreciate GI following - Patient is currently on sips of water but diet to be advanced per GI - Continue supportive care with IV fluids,  analgesia and antiemetics as needed    Acute renal failure - Likely prerenal, in the setting of necrotizing pancreatitis - Creatinine improved without IV fluids    Controlled diabetes mellitus without complications with long-term insulin use - Patient is on Lantus 50 units at bedtime along with sliding scale insulin - Recent A1c in June 2017 was 6.5 indicating good glycemic control    Essential hypertension - Continue carvedilol and clonidine patch     Normocytic anemia - Hemoglobin 7.4, overall stable - We'll stop Lovenox subcutaneous for DVT prophylaxis and will use SCDs instead      Hypokalemia - Due to GI losses - Supplemented - Potassium now within normal limits     DVT prophylaxis: Lovenox subcutaneous Code Status: full code  Family Communication: Family not at the bedside Disposition Plan: Home once cleared by GI   Consultants:   GI  Procedures:   ERCP 01/31/2016  Antimicrobials:   Zosyn 01/29/2016 -->   Subjective: Feels okay this am.  Objective: Filed Vitals:   01/31/16 2000 01/31/16 2008 01/31/16 2137 02/01/16 0519  BP:  111/57 118/64 129/98  Pulse:  81 83 83  Temp: 100.2 F (37.9 C)  99.2 F (37.3 C) 99.9 F (37.7 C)  TempSrc: Oral  Oral Oral  Resp:   19 18  Height:      Weight:      SpO2:  97% 91% 95%    Intake/Output Summary (Last 24 hours) at 02/01/16 0816 Last data filed at 01/31/16 1753  Gross per 24 hour  Intake   1000 ml  Output      0 ml  Net   1000 ml   Filed Weights   01/29/16 2300  Weight: 119.5 kg (  263 lb 7.2 oz)    Examination:  General exam: Appears calm and comfortable  Respiratory system: Clear to auscultation. Respiratory effort normal. Cardiovascular system: S1 & S2 heard, RRR. No JVD, murmurs, rubs, gallops or clicks. No pedal edema. Gastrointestinal system: Abdomen is nondistended, soft and nontender. No organomegaly or masses felt. Normal bowel sounds heard. Central nervous system: Alert and oriented. No  focal neurological deficits. Extremities: Symmetric 5 x 5 power. +1 LE pitting edema Skin: No rashes, lesions or ulcers Psychiatry: Judgement and insight appear normal. Mood & affect appropriate.   Data Reviewed: I have personally reviewed following labs and imaging studies  CBC:  Recent Labs Lab 01/29/16 1605 01/30/16 0532 01/31/16 0437 02/01/16 0550  WBC 10.4 7.0 5.9 6.1  NEUTROABS  --   --  4.3 4.7  HGB 8.6* 7.6* 7.3* 7.4*  HCT 27.0* 24.1* 22.8* 23.4*  MCV 90.3 89.9 90.1 91.8  PLT 401* 353 367 333   Basic Metabolic Panel:  Recent Labs Lab 01/29/16 1605 01/30/16 0532 01/31/16 0437 02/01/16 0550  NA 130* 133* 135 138  K 4.1 3.6 3.4* 4.6  CL 97* 100* 101 95*  CO2 GLUCOSE 185* 127* 74 112*  BUN CREATININE 1.37* 1.27* 1.16 1.24  CALCIUM 8.8* 8.5* 8.2* 9.2   GFR: Estimated Creatinine Clearance: 103.1 mL/min (by C-G formula based on Cr of 1.24). Liver Function Tests:  Recent Labs Lab 01/29/16 1605 01/30/16 0532 01/31/16 0437 02/01/16 0550  AST 127* 79* 71* 33  ALT 168* 123* 91* 62  ALKPHOS 434* 351* 307* 257*  BILITOT 9.8* 9.8* 9.5* 4.9*  PROT 6.8 6.4* 5.9* 5.6*  ALBUMIN 2.4* 2.1* 1.8* 1.9*    Recent Labs Lab 01/29/16 1605  LIPASE 27   No results for input(s): AMMONIA in the last 168 hours. Coagulation Profile:  Recent Labs Lab 01/30/16 0532 01/31/16 0437  INR 1.42 1.40   Cardiac Enzymes: No results for input(s): CKTOTAL, CKMB, CKMBINDEX, TROPONINI in the last 168 hours. BNP (last 3 results) No results for input(s): PROBNP in the last 8760 hours. HbA1C: No results for input(s): HGBA1C in the last 72 hours. CBG:  Recent Labs Lab 02/01/16 0008 02/01/16 0021 02/01/16 0419 02/01/16 0454 02/01/16 0742  GLUCAP 69 106* 68 109* 109*   Lipid Profile: No results for input(s): CHOL, HDL, LDLCALC, TRIG, CHOLHDL, LDLDIRECT in the last 72 hours. Thyroid Function Tests: No results for input(s): TSH, T4TOTAL, FREET4,  T3FREE, THYROIDAB in the last 72 hours. Anemia Panel: No results for input(s): VITAMINB12, FOLATE, FERRITIN, TIBC, IRON, RETICCTPCT in the last 72 hours. Urine analysis: No results found for: COLORURINE, APPEARANCEUR, LABSPEC, PHURINE, GLUCOSEU, HGBUR, BILIRUBINUR, KETONESUR, PROTEINUR, UROBILINOGEN, NITRITE, LEUKOCYTESUR Sepsis Labs: (procalcitonin:4,lacticidven:4)   )No results found for this or any previous visit (from the past 240 hour(s)).    Radiology Studies: Dg Chest 2 View 01/30/2016  Bibasilar atelectasis/ infiltrate. Left lower hilar density likely due to airspace disease. No mass lesion in this area on recent chest CT 12/21/2015.  Ct Abdomen Pelvis W Contrast 01/29/2016   1. Necrotizing pancreatitis with interval development of a large 24 x 14 cm peripancreatic fluid collection. The fluid collection exerts mass effect on the central common bile duct resulting in intra and extrahepatic biliary duct dilatation and gallbladder distention, both of which are new findings. No blood flow is seen in the splenic vein. The peripancreatic collection exerts mass effect on the portal vein as it passes the collection with significant  narrowing but no definitive thrombosis.  Dg Ercp 01/31/2016   Plastic biliary stent placement across the distal CBD. These images were submitted for radiologic interpretation only.     Scheduled Meds: . carvedilol  25 mg Oral BID  . [START ON 02/05/2016] cloNIDine  0.2 mg Transdermal Weekly  . enoxaparin (LOVENOX) injection  40 mg Subcutaneous Q24H  . insulin aspart  0-20 Units Subcutaneous Q4H  . insulin glargine  50 Units Subcutaneous QHS  . piperacillin-tazobactam (ZOSYN)  IV  3.375 g Intravenous Q8H   Continuous Infusions: . lactated ringers 150 mL/hr at 02/01/16 0427     LOS: 3 days    Time spent: 25 minutes  Greater than 50% of the time spent on counseling and coordinating the care.   Manson PasseyEVINE, Dilcia Rybarczyk, MD [facility] Pager  [phone]  If 7PM-7AM, please contact night-coverage [email or web address] Password [email or web address] 02/01/2016, 8:16 AM

## 2016-02-01 NOTE — Progress Notes (Signed)
Notified on call in regards to patient GBS dropping to 69 and came up to 109 s/p 1/2 amp d50. Awaiting return page. Will continue to monitor

## 2016-02-01 NOTE — Progress Notes (Signed)
[facility] Progress Note  Subjective: Doing well, slight right upper quadrant pain since procedure which is improving. Not to eat.  Objective: Vital signs in last 24 hours: Temp:  [98.6 F (37 C)-100.2 F (37.9 C)] 99.9 F (37.7 C) (07/03 0519) Pulse Rate:  [73-83] 83 (07/03 0519) Resp:  [17-19] 18 (07/03 0519) BP: (100-129)/(57-98) 129/98 mmHg (07/03 0519) SpO2:  [91 %-97 %] 95 % (07/03 0519) Weight change:    PE: Mild scleral icterus  Lab Results: Results for orders placed or performed during the hospital encounter of 01/29/16 (from the past 24 hour(s))  Glucose, capillary     Status: None   Collection Time: 01/31/16  5:00 PM  Result Value Ref Range   Glucose-Capillary 89 65 - 99 mg/dL  Glucose, capillary     Status: None   Collection Time: 01/31/16  8:06 PM  Result Value Ref Range   Glucose-Capillary 75 65 - 99 mg/dL  Glucose, capillary     Status: None   Collection Time: 01/31/16  8:32 PM  Result Value Ref Range   Glucose-Capillary 77 65 - 99 mg/dL  Glucose, capillary     Status: None   Collection Time: 02/01/16 12:08 AM  Result Value Ref Range   Glucose-Capillary 69 65 - 99 mg/dL  Glucose, capillary     Status: Abnormal   Collection Time: 02/01/16 12:21 AM  Result Value Ref Range   Glucose-Capillary 106 (H) 65 - 99 mg/dL  Glucose, capillary     Status: None   Collection Time: 02/01/16  4:19 AM  Result Value Ref Range   Glucose-Capillary 68 65 - 99 mg/dL   Comment 1 Notify RN    Comment 2 Document in Chart   Glucose, capillary     Status: Abnormal   Collection Time: 02/01/16  4:54 AM  Result Value Ref Range   Glucose-Capillary 109 (H) 65 - 99 mg/dL  CBC with Differential/Platelet     Status: Abnormal   Collection Time: 02/01/16  5:50 AM  Result Value Ref Range   WBC 6.1 4.0 - 10.5 K/uL   RBC 2.55 (L) 4.22 - 5.81 MIL/uL   Hemoglobin 7.4 (L) 13.0 - 17.0 g/dL   HCT 69.623.4 (L) 78.939.0 - 38.152.0 %   MCV 91.8 78.0 - 100.0 fL   MCH 29.0 26.0 - 34.0 pg    MCHC 31.6 30.0 - 36.0 g/dL   RDW 01.715.5 51.011.5 - 25.815.5 %   Platelets 333 150 - 400 K/uL   Neutrophils Relative % 77 %   Neutro Abs 4.7 1.7 - 7.7 K/uL   Lymphocytes Relative 8 %   Lymphs Abs 0.5 (L) 0.7 - 4.0 K/uL   Monocytes Relative 15 %   Monocytes Absolute 0.9 0.1 - 1.0 K/uL   Eosinophils Relative 1 %   Eosinophils Absolute 0.1 0.0 - 0.7 K/uL   Basophils Relative 0 %   Basophils Absolute 0.0 0.0 - 0.1 K/uL  Comprehensive metabolic panel     Status: Abnormal   Collection Time: 02/01/16  5:50 AM  Result Value Ref Range   Sodium 138 135 - 145 mmol/L   Potassium 4.6 3.5 - 5.1 mmol/L   Chloride 95 (L) 101 - 111 mmol/L   CO2 25 22 - 32 mmol/L   Glucose, Bld 112 (H) 65 - 99 mg/dL   BUN 8 6 - 20 mg/dL   Creatinine, Ser 5.271.24 0.61 - 1.24 mg/dL   Calcium 9.2 8.9 - 78.210.3 mg/dL   Total Protein 5.6 (L) 6.5 -  8.1 g/dL   Albumin 1.9 (L) 3.5 - 5.0 g/dL   AST 33 15 - 41 U/L   ALT 62 17 - 63 U/L   Alkaline Phosphatase 257 (H) 38 - 126 U/L   Total Bilirubin 4.9 (H) 0.3 - 1.2 mg/dL   GFR calc non Af Amer >60 >60 mL/min   GFR calc Af Amer >60 >60 mL/min   Anion gap 18 (H) 5 - 15  Glucose, capillary     Status: Abnormal   Collection Time: 02/01/16  7:42 AM  Result Value Ref Range   Glucose-Capillary 109 (H) 65 - 99 mg/dL  Glucose, capillary     Status: Abnormal   Collection Time: 02/01/16 11:40 AM  Result Value Ref Range   Glucose-Capillary 100 (H) 65 - 99 mg/dL    Studies/Results: Dg Chest 2 View  01/30/2016  CLINICAL DATA:  Cough EXAM: CHEST  2 VIEW COMPARISON:  01/13/2016 FINDINGS: Heart size and vascularity within normal limits. Mild atelectasis or scarring in the lung bases. Right perihilar atelectasis has resolved. 1 cm nodular density below the left hilum is similar to the prior study and may be due to atelectasis overlying blood vessels. No pleural effusion. Negative for edema. IMPRESSION: Bibasilar atelectasis/ infiltrate. Left lower hilar density likely due to airspace disease. No mass  lesion in this area on recent chest CT 12/21/2015 Electronically Signed   By: Marlan Palauharles  Clark M.D.   On: 01/30/2016 16:13   Dg Ercp  01/31/2016  CLINICAL DATA:  Biliary obstruction EXAM: ERCP TECHNIQUE: Multiple spot images obtained with the fluoroscopic device and submitted for interpretation post-procedure. FLUOROSCOPY TIME:  3 minutes 29 seconds COMPARISON:  CT 01/29/2016 FINDINGS: Two spot images document endoscopy. A plastic stent extends from the proximal CBD across the expected level of the ampulla. There is opacification of the proximal CBD, which is not visualized in the mid and distal segments. Intrahepatic ducts are incompletely opacified and visualized. IMPRESSION: Plastic biliary stent placement across the distal CBD. These images were submitted for radiologic interpretation only. Please see the procedural report for the amount of contrast and the fluoroscopy time utilized. Electronically Signed   By: Corlis Leak  Hassell M.D.   On: 01/31/2016 09:13      Assessment: Common bile duct obstruction extrinsic from peripancreatic fluid collection related to necrotizing pancreatitis status post stent placement yesterday with good fall in bilirubin.  Plan: Advance to low-fat diet, recheck liver function tests in the morning. If there were no other issues and the biliary obstruction, probably could be discharged tomorrow to follow up with Dr. Dulce Sellaroutlaw.    Demone Lyles C 02/01/2016, 1:07 PM  Pager [phone] If no answer or after 5 PM call [phone]

## 2016-02-02 DIAGNOSIS — E1169 Type 2 diabetes mellitus with other specified complication: Secondary | ICD-10-CM

## 2016-02-02 DIAGNOSIS — N179 Acute kidney failure, unspecified: Secondary | ICD-10-CM

## 2016-02-02 DIAGNOSIS — K8591 Acute pancreatitis with uninfected necrosis, unspecified: Principal | ICD-10-CM

## 2016-02-02 DIAGNOSIS — K869 Disease of pancreas, unspecified: Secondary | ICD-10-CM

## 2016-02-02 DIAGNOSIS — I10 Essential (primary) hypertension: Secondary | ICD-10-CM

## 2016-02-02 LAB — GLUCOSE, CAPILLARY
Glucose-Capillary: 71 mg/dL (ref 65–99)
Glucose-Capillary: 86 mg/dL (ref 65–99)

## 2016-02-02 LAB — COMPREHENSIVE METABOLIC PANEL
ALT: 48 U/L (ref 17–63)
AST: 31 U/L (ref 15–41)
Albumin: 2 g/dL — ABNORMAL LOW (ref 3.5–5.0)
Alkaline Phosphatase: 233 U/L — ABNORMAL HIGH (ref 38–126)
Anion gap: 7 (ref 5–15)
BUN: 6 mg/dL (ref 6–20)
CO2: 25 mmol/L (ref 22–32)
Calcium: 8.3 mg/dL — ABNORMAL LOW (ref 8.9–10.3)
Chloride: 102 mmol/L (ref 101–111)
Creatinine, Ser: 1.1 mg/dL (ref 0.61–1.24)
GFR calc Af Amer: >60 mL/min (ref >60)
GFR calc non Af Amer: >60 mL/min (ref >60)
Glucose, Bld: 67 mg/dL (ref 65–99)
Potassium: 4.3 mmol/L (ref 3.5–5.1)
Sodium: 134 mmol/L — ABNORMAL LOW (ref 135–145)
Total Bilirubin: 3.9 mg/dL — ABNORMAL HIGH (ref 0.3–1.2)
Total Protein: 6.1 g/dL — ABNORMAL LOW (ref 6.5–8.1)

## 2016-02-02 LAB — CBC WITH DIFFERENTIAL/PLATELET
Basophils Absolute: 0 10*3/uL (ref 0.0–0.1)
Basophils Relative: 0 %
Eosinophils Absolute: 0.1 10*3/uL (ref 0.0–0.7)
Eosinophils Relative: 1 %
HCT: 26.2 % — ABNORMAL LOW (ref 39.0–52.0)
Hemoglobin: 8.2 g/dL — ABNORMAL LOW (ref 13.0–17.0)
Lymphocytes Relative: 8 %
Lymphs Abs: 0.7 10*3/uL (ref 0.7–4.0)
MCH: 28.9 pg (ref 26.0–34.0)
MCHC: 31.3 g/dL (ref 30.0–36.0)
MCV: 92.3 fL (ref 78.0–100.0)
Monocytes Absolute: 1.2 10*3/uL — ABNORMAL HIGH (ref 0.1–1.0)
Monocytes Relative: 14 %
Neutro Abs: 6.5 10*3/uL (ref 1.7–7.7)
Neutrophils Relative %: 77 %
Platelets: 406 10*3/uL — ABNORMAL HIGH (ref 150–400)
RBC: 2.84 MIL/uL — ABNORMAL LOW (ref 4.22–5.81)
RDW: 15.5 % (ref 11.5–15.5)
WBC: 8.4 10*3/uL (ref 4.0–10.5)

## 2016-02-02 MED ORDER — INSULIN GLARGINE 100 UNIT/ML SOLOSTAR PEN
50.0000 [IU] | PEN_INJECTOR | Freq: Every day | SUBCUTANEOUS | Status: DC
Start: 2016-02-02 — End: 2016-02-09

## 2016-02-02 MED ORDER — GUAIFENESIN-CODEINE 100-10 MG/5ML PO SOLN: 5.0000 mL | Freq: Every evening | ORAL | Status: AC | PRN

## 2016-02-02 NOTE — Progress Notes (Signed)
NURSING PROGRESS NOTE  Osher Oettinger [id number] Discharge Data: 02/02/2016 10:32 AM Attending Provider: Annita Brod, MD KGO:[id number] R Rankins, MD     [name] to be D/C'd Home per MD order.  Discussed with the patient the After Visit Summary and all questions fully answered. All IV's discontinued with no bleeding noted. All belongings returned to patient for patient to take home.   Last Vital Signs:  Blood pressure 112/64, pulse 73, temperature 98.6 F (37 C), temperature source Oral, resp. rate 17, height _0  (1.803 m), weight 119.5 kg (263 lb 7.2 oz), SpO2 97 %.  Discharge Medication List   Medication List    STOP taking these medications        furosemide 20 MG tablet  Commonly known as:  LASIX      TAKE these medications        acetaminophen 325 MG tablet  Commonly known as:  TYLENOL  Take 650 mg by mouth every 6 (six) hours as needed for fever.     albuterol 108 (90 Base) MCG/ACT inhaler  Commonly known as:  PROVENTIL HFA;VENTOLIN HFA  Inhale 1-2 puffs into the lungs every 6 (six) hours as needed for wheezing or shortness of breath.     blood glucose meter kit and supplies  Dispense based on patient and insurance preference. Use up to four times daily as directed. (FOR ICD-9 250.00, 250.01).     carvedilol 25 MG tablet  Commonly known as:  COREG  Take 1 tablet (25 mg total) by mouth 2 (two) times daily.     cloNIDine 0.2 mg/24hr patch  Commonly known as:  CATAPRES - Dosed in mg/24 hr  Place 1 patch (0.2 mg total) onto the skin once a week.     guaiFENesin-codeine 100-10 MG/5ML syrup  Take 5 mLs by mouth at bedtime as needed for cough.     insulin aspart 100 UNIT/ML FlexPen  Commonly known as:  NOVOLOG  8 units with meals (as long as eating 50%) PLUS the following scale: CBG 70 - 120: 0 units CBG 121 - 150: 2 units CBG 151 - 200: 3 units CBG 201 - 250: 5 units CBG 251 - 300: 8 units CBG 301 - 350: 11 units CBG 351 - 400: 15 units     Insulin  Glargine 100 UNIT/ML Solostar Pen  Commonly known as:  LANTUS SOLOSTAR  Inject 50 Units into the skin daily at 10 pm.     Insulin Pen Needle 31G X 5 MM Misc  For insulin injections     pantoprazole 40 MG tablet  Commonly known as:  PROTONIX  Take 1 tablet (40 mg total) by mouth at bedtime.         Charolette Child, RN

## 2016-02-02 NOTE — Progress Notes (Signed)
Patient ID: Timothy [name], male   DOB: 1976/02/11, 40 y.o.   MR[facility]: [id number] [facility]  Timothy Timothy [name] 40 y.o. 1976/02/11   Subjective: Doing well. Tolerating heart healthy diet. Denies abdominal pain, nausea, or vomiting, In great spirits. Wife at bedside.  Objective: Vital signs in last 24 hours: Filed Vitals:   02/02/16 0532 02/02/16 0840  BP: 109/59 112/64  Pulse: 73 73  Temp: 98.6 F (37 C)   Resp: 17     Physical Exam: Gen: alert, no acute distress, morbidly obese HEENT: +scleral icterus CV: RRR Chest: CTA B Abd: minimally tender periumbilically, epigastric, RUQ with minimal guarding, soft, nondistended, +BS  Lab Results:  Recent Labs  02/01/16 0550 02/02/16 0528  NA 138 134*  K 4.6 4.3  CL 95* 102  CO2 25 25  GLUCOSE 112* 67  BUN 8 6  CREATININE 1.24 1.10  CALCIUM 9.2 8.3*    Recent Labs  02/01/16 0550 02/02/16 0528  AST 33 31  ALT 62 48  ALKPHOS 257* 233*  BILITOT 4.9* 3.9*  PROT 5.6* 6.1*  ALBUMIN 1.9* 2.0*    Recent Labs  02/01/16 0550 02/02/16 0528  WBC 6.1 8.4  NEUTROABS 4.7 6.5  HGB 7.4* 8.2*  HCT 23.4* 26.2*  MCV 91.8 92.3  PLT 333 406*    Recent Labs  01/31/16 0437  LABPROT 17.3*  INR 1.40      Assessment/Plan: 40 yo with necrotizing pancreatitis and s/p plastic biliary stent due to obstructive jaundice from large peripancreatic fluid collection. Doing better with normalization of TB, ALP and normal AST/ALT. Stable to go home on low fat diet. F/U with Dr. Dulce Sellarutlaw in 2-3 weeks (our office will arrange).   Breylin Dom C. 02/02/2016, 11:00 AM  Pager [phone]  If no answer or after 5 PM call [phone]

## 2016-02-02 NOTE — Discharge Summary (Signed)
Discharge Summary  Timothy [name] MRN:[id number] DOB: 02/20/1976  PCP: Victoria R Rankins, MD  Admit date: 01/29/2016 Discharge date: 02/02/2016  Time spent: 25 minutes   Recommendations for Outpatient Follow-up:  1. New medication: Guaifenesin-codeine cough syrup when necessary 2. Patient will follow up with Dr. outlaw, gastroenterology in the next 2-3 weeks. Office will set up appointment.   Discharge Diagnoses:  Active Hospital Problems   Diagnosis Date Noted  . Necrotizing pancreatitis 01/29/2016  . Cough 01/30/2016  . Diabetes mellitus associated with pancreatic disease (HCC) 01/29/2016  . Hyponatremia 01/29/2016  . Hyperbilirubinemia 01/29/2016  . AKI (acute kidney injury) (HCC) 12/22/2015  . Accelerated hypertension 12/22/2015    Resolved Hospital Problems   Diagnosis Date Noted Date Resolved  No resolved problems to display.    Discharge Condition: Improved, being discharged home   Diet recommendation: Carb modified   Filed Vitals:   02/02/16 0532 02/02/16 0840  BP: 109/59 112/64  Pulse: 73 73  Temp: 98.6 F (37 C)   Resp: 17     History of present illness:  39 y.o. male with past medical history significant for recent hospitalization for pancreatitis, admitted from 12/21/15-01/05/15 with acute severe necrotizing pancreatitis with shock/sepsis complicated by acute respiratory failure, transaminitis, anemia of critical illness, acute encephalopathy, AKI (Cr 1.36), protein-calorie malnutrition with severe hypoalbuminemia and hypokalemia. He was discharged from [facility] on 01/05/16 and was doing fine until his wife reported more jaundice 01/24/2016. He was seen by Dr. Rankin on outpt basis, had CT scan done. Once results were in patient was called to come back to ER for evaluation on 6/30. On admission, liver function tests noted to be elevated with a bilirubin of 9.8. CT of abdomen and pelvis noted necrotizing pancreatitis with mass effect to central bile duct leading to  biliary duct dilatation and gallbladder distention. Patient admitted to hospitalist service and gastroenterology consulted.   Hospital Course:  Principal Problem:   Necrotizing pancreatitis/recurrent with secondary transaminitis and hyperbilirubinemia: Unclear etiology. Thought to be from gallstones.  Patient denies alcohol use or abuse. No alcohol level done on admission. Lipid panel within normal limits. Started on IV Zosyn, Patient was seen by gastroenterology and underwent ERCP on 7/2. At that time plastic biliary stent placed across distal common bile duct. Over the next few days after transaminases and bilirubin trended downward. By day of discharge, 7/4, alkaline phosphatase 233 (434 on admission), transaminases normal (127 and 168 on admission) and bilirubin down to 3.9 (9.8 on admission) Active Problems:   AKI (acute kidney injury) (HCC): Secondary dehydration. Creatinine on admission 1.37 and at 1.1 by day of discharge.   Accelerated hypertension: Secondary to pain, stable   Diabetes mellitus associated with pancreatic disease (HCC): Patient will resume insulin now degrees by mouth   Hyponatremia: Secondary dehydration. Resolved.    Cough: Minimal. Likely secondary to diaphragm irritation second from pancreatitis. When necessary cough syrup on discharge. Anemia: Secondary to necrotizing pancreatitis. Hemoglobin as low as 7.3 on 7/2. By 7/4 up to 8.2 without transfusion.  Procedures:  ERCP done 7/2 with stent placement   Consultations:  Gastroenterology   Discharge Exam: BP 112/64 mmHg  Pulse 73  Temp(Src) 98.6 F (37 C) (Oral)  Resp 17  Ht 5' 11" (1.803 m)  Wt 119.5 kg (263 lb 7.2 oz)  BMI 36.76 kg/m2  SpO2 97%  General: Alert and oriented 3  Cardiovascular: Regular rate and rhythm, S1-S2  Respiratory: Clear to auscultation bilaterally   Discharge Instructions You were cared for by   a hospitalist during your hospital stay. If you have any questions about your discharge  medications or the care you received while you were in the hospital after you are discharged, you can call the unit and asked to speak with the hospitalist on call if the hospitalist that took care of you is not available. Once you are discharged, your primary care physician will handle any further medical issues. Please note that NO REFILLS for any discharge medications will be authorized once you are discharged, as it is imperative that you return to your primary care physician (or establish a relationship with a primary care physician if you do not have one) for your aftercare needs so that they can reassess your need for medications and monitor your lab values.  Discharge Instructions    Diet Carb Modified    Complete by:  As directed      Increase activity slowly    Complete by:  As directed             Medication List    STOP taking these medications        furosemide 20 MG tablet  Commonly known as:  LASIX      TAKE these medications        acetaminophen 325 MG tablet  Commonly known as:  TYLENOL  Take 650 mg by mouth every 6 (six) hours as needed for fever.     albuterol 108 (90 Base) MCG/ACT inhaler  Commonly known as:  PROVENTIL HFA;VENTOLIN HFA  Inhale 1-2 puffs into the lungs every 6 (six) hours as needed for wheezing or shortness of breath.     blood glucose meter kit and supplies  Dispense based on patient and insurance preference. Use up to four times daily as directed. (FOR ICD-9 250.00, 250.01).     carvedilol 25 MG tablet  Commonly known as:  COREG  Take 1 tablet (25 mg total) by mouth 2 (two) times daily.     cloNIDine 0.2 mg/24hr patch  Commonly known as:  CATAPRES - Dosed in mg/24 hr  Place 1 patch (0.2 mg total) onto the skin once a week.     guaiFENesin-codeine 100-10 MG/5ML syrup  Take 5 mLs by mouth at bedtime as needed for cough.     insulin aspart 100 UNIT/ML FlexPen  Commonly known as:  NOVOLOG  8 units with meals (as long as eating 50%) PLUS  the following scale: CBG 70 - 120: 0 units CBG 121 - 150: 2 units CBG 151 - 200: 3 units CBG 201 - 250: 5 units CBG 251 - 300: 8 units CBG 301 - 350: 11 units CBG 351 - 400: 15 units     Insulin Glargine 100 UNIT/ML Solostar Pen  Commonly known as:  LANTUS SOLOSTAR  Inject 50 Units into the skin daily at 10 pm.     Insulin Pen Needle 31G X 5 MM Misc  For insulin injections     pantoprazole 40 MG tablet  Commonly known as:  PROTONIX  Take 1 tablet (40 mg total) by mouth at bedtime.       No Known Allergies    The results of significant diagnostics from this hospitalization (including imaging, microbiology, ancillary and laboratory) are listed below for reference.    Significant Diagnostic Studies: Dg Chest 2 View  01/30/2016  CLINICAL DATA:  Cough EXAM: CHEST  2 VIEW COMPARISON:  01/13/2016 FINDINGS: Heart size and vascularity within normal limits. Mild atelectasis or scarring in the lung bases.   Right perihilar atelectasis has resolved. 1 cm nodular density below the left hilum is similar to the prior study and may be due to atelectasis overlying blood vessels. No pleural effusion. Negative for edema. IMPRESSION: Bibasilar atelectasis/ infiltrate. Left lower hilar density likely due to airspace disease. No mass lesion in this area on recent chest CT 12/21/2015 Electronically Signed   By: Charles  Clark M.D.   On: 01/30/2016 16:13   Dg Chest 2 View  01/13/2016  CLINICAL DATA:  The pt is c/o abd an elevated temp today with a cough. He was recently admitted for pancreatitis from gallstones. His abd feel bloated No pain EXAM: CHEST - 2 VIEW COMPARISON:  12/29/2015 FINDINGS: Patient has been extubated, the central line and nasogastric tube removed. Linear right perihilar scarring or atelectasis persists. Left lung remains clear.  Heart size normal.  No pneumothorax. No effusion. Visualized bones unremarkable. IMPRESSION: 1. Persistent linear scarring or atelectasis at the right hilum. No acute  findings. Electronically Signed   By: D  Hassell M.D.   On: 01/13/2016 23:00   Ct Abdomen Pelvis W Contrast  01/29/2016  CLINICAL DATA:  Follow-up pancreatitis. EXAM: CT ABDOMEN AND PELVIS WITH CONTRAST TECHNIQUE: Multidetector CT imaging of the abdomen and pelvis was performed using the standard protocol following bolus administration of intravenous contrast. CONTRAST:  125mL ISOVUE-300 IOPAMIDOL (ISOVUE-300) INJECTION 61% COMPARISON:  CT scans since Dec 21, 2015 with the most recent comparison from January 02, 2016 FINDINGS: Mild atelectasis in the left lung base. A small calcified granuloma is seen in the right lung base. Lung bases are otherwise unchanged and unremarkable. No free air. Necrotizing pancreatitis is again identified. The pancreatic body and tail are no longer visualized due to necrosis. There is a large peripancreatic fluid collection measuring 24 x 14 cm on series 2, image 41. A few adjacent smaller collections are also seen such as anterior to the SMV on image 51. There is extensive fat stranding in the surrounding tissues. The peripancreatic fluid collection exerts mass effect on the distal common bile duct resulting in intra and extrahepatic biliary duct dilatation which is a new finding. No focal liver masses are identified. The hepatic veins are patent. There is narrowing of the portal vein as it passes by the peripancreatic collection. This narrowing is best seen on coronal images 49 through 55. No definitive portal vein thrombosis is identified at this time. The SMV remains patent. The splenic vein is no longer visualized. The gallbladder is markedly distended which correlates with the central common bile duct. The spleen is slightly more prominent in the interval but otherwise normal. Some collateral splenic vessels are identified near the splenic hilum. The adrenal glands and kidneys are normal. The abdominal aorta is normal in caliber with no atherosclerosis. No adenopathy identified. The  peripancreatic fluid collection exerts mass effect on the second portion the duodenum without gastric distention. The small bowel is otherwise normal. The colon is normal. No secondary evidence of appendicitis. No adenopathy or mass in the pelvis. The bladder is normal. The prostate and seminal vesicles are unremarkable. Delayed images demonstrate no filling defects in the upper renal collecting systems. Visualized bones are unremarkable other than mild degenerative changes in the spine. IMPRESSION: 1. Necrotizing pancreatitis with interval development of a large 24 x 14 cm peripancreatic fluid collection. The fluid collection exerts mass effect on the central common bile duct resulting in intra and extrahepatic biliary duct dilatation and gallbladder distention, both of which are new findings. No blood flow   is seen in the splenic vein. The peripancreatic collection exerts mass effect on the portal vein as it passes the collection with significant narrowing but no definitive thrombosis. These results will be called to the ordering clinician or representative by the Radiologist Assistant, and communication documented in the PACS or zVision Dashboard. Electronically Signed   By: Dorise Bullion III M.D   On: 01/29/2016 13:44   Dg Ercp  01/31/2016  CLINICAL DATA:  Biliary obstruction EXAM: ERCP TECHNIQUE: Multiple spot images obtained with the fluoroscopic device and submitted for interpretation post-procedure. FLUOROSCOPY TIME:  3 minutes 29 seconds COMPARISON:  CT 01/29/2016 FINDINGS: Two spot images document endoscopy. A plastic stent extends from the proximal CBD across the expected level of the ampulla. There is opacification of the proximal CBD, which is not visualized in the mid and distal segments. Intrahepatic ducts are incompletely opacified and visualized. IMPRESSION: Plastic biliary stent placement across the distal CBD. These images were submitted for radiologic interpretation only. Please see the  procedural report for the amount of contrast and the fluoroscopy time utilized. Electronically Signed   By: Lucrezia Europe M.D.   On: 01/31/2016 09:13    Microbiology: No results found for this or any previous visit (from the past 240 hour(s)).   Labs: Basic Metabolic Panel:  Recent Labs Lab 01/29/16 1605 01/30/16 0532 01/31/16 0437 02/01/16 0550 02/02/16 0528  NA 130* 133* 135 138 134*  K 4.1 3.6 3.4* 4.6 4.3  CL 97* 100* 101 95* 102  CO2 _0 GLUCOSE 185* 127* 74 112* 67  BUN _1 CREATININE 1.37* 1.27* 1.16 1.24 1.10  CALCIUM 8.8* 8.5* 8.2* 9.2 8.3*   Liver Function Tests:  Recent Labs Lab 01/29/16 1605 01/30/16 0532 01/31/16 0437 02/01/16 0550 02/02/16 0528  AST 127* 79* 71* 33 31  ALT 168* 123* 91* 62 48  ALKPHOS 434* 351* 307* 257* 233*  BILITOT 9.8* 9.8* 9.5* 4.9* 3.9*  PROT 6.8 6.4* 5.9* 5.6* 6.1*  ALBUMIN 2.4* 2.1* 1.8* 1.9* 2.0*    Recent Labs Lab 01/29/16 1605  LIPASE 27   No results for input(s): AMMONIA in the last 168 hours. CBC:  Recent Labs Lab 01/29/16 1605 01/30/16 0532 01/31/16 0437 02/01/16 0550 02/02/16 0528  WBC 10.4 7.0 5.9 6.1 8.4  NEUTROABS  --   --  4.3 4.7 6.5  HGB 8.6* 7.6* 7.3* 7.4* 8.2*  HCT 27.0* 24.1* 22.8* 23.4* 26.2*  MCV 90.3 89.9 90.1 91.8 92.3  PLT 401* 353 367 333 406*   Cardiac Enzymes: No results for input(s): CKTOTAL, CKMB, CKMBINDEX, TROPONINI in the last 168 hours. BNP: BNP (last 3 results)  Recent Labs  12/24/15 0757  BNP 151.9*    ProBNP (last 3 results) No results for input(s): PROBNP in the last 8760 hours.  CBG:  Recent Labs Lab 02/01/16 1648 02/01/16 1943 02/01/16 2339 02/02/16 0422 02/02/16 0750  GLUCAP 163* 176* 135* 71 86       Signed:  Annita Brod, MD [facility] 02/02/2016, 11:12 AM

## 2016-02-04 DIAGNOSIS — K8592 Acute pancreatitis with infected necrosis, unspecified: Secondary | ICD-10-CM | POA: Diagnosis not present

## 2016-02-04 DIAGNOSIS — Z794 Long term (current) use of insulin: Secondary | ICD-10-CM | POA: Diagnosis not present

## 2016-02-04 DIAGNOSIS — I10 Essential (primary) hypertension: Secondary | ICD-10-CM | POA: Diagnosis not present

## 2016-02-04 DIAGNOSIS — E46 Unspecified protein-calorie malnutrition: Secondary | ICD-10-CM | POA: Diagnosis not present

## 2016-02-04 DIAGNOSIS — Z9181 History of falling: Secondary | ICD-10-CM | POA: Diagnosis not present

## 2016-02-04 DIAGNOSIS — D638 Anemia in other chronic diseases classified elsewhere: Secondary | ICD-10-CM | POA: Diagnosis not present

## 2016-02-04 DIAGNOSIS — E1165 Type 2 diabetes mellitus with hyperglycemia: Secondary | ICD-10-CM | POA: Diagnosis not present

## 2016-02-06 ENCOUNTER — Encounter (HOSPITAL_BASED_OUTPATIENT_CLINIC_OR_DEPARTMENT_OTHER): Payer: Self-pay | Admitting: Emergency Medicine

## 2016-02-06 ENCOUNTER — Inpatient Hospital Stay (HOSPITAL_BASED_OUTPATIENT_CLINIC_OR_DEPARTMENT_OTHER)
Admission: EM | Admit: 2016-02-06 | Discharge: 2016-02-09 | DRG: 920 | Disposition: A | Discharge status: Other Acute Inpatient Hospital | Payer: Federal, State, Local not specified - PPO | Attending: Internal Medicine | Admitting: Internal Medicine

## 2016-02-06 ENCOUNTER — Emergency Department (HOSPITAL_BASED_OUTPATIENT_CLINIC_OR_DEPARTMENT_OTHER): Payer: Federal, State, Local not specified - PPO

## 2016-02-06 DIAGNOSIS — R791 Abnormal coagulation profile: Secondary | ICD-10-CM | POA: Diagnosis not present

## 2016-02-06 DIAGNOSIS — K831 Obstruction of bile duct: Secondary | ICD-10-CM | POA: Diagnosis not present

## 2016-02-06 DIAGNOSIS — E089 Diabetes mellitus due to underlying condition without complications: Secondary | ICD-10-CM | POA: Diagnosis present

## 2016-02-06 DIAGNOSIS — K862 Cyst of pancreas: Secondary | ICD-10-CM | POA: Diagnosis not present

## 2016-02-06 DIAGNOSIS — I959 Hypotension, unspecified: Secondary | ICD-10-CM | POA: Diagnosis not present

## 2016-02-06 DIAGNOSIS — D649 Anemia, unspecified: Secondary | ICD-10-CM | POA: Diagnosis present

## 2016-02-06 DIAGNOSIS — J90 Pleural effusion, not elsewhere classified: Secondary | ICD-10-CM | POA: Diagnosis not present

## 2016-02-06 DIAGNOSIS — Z8249 Family history of ischemic heart disease and other diseases of the circulatory system: Secondary | ICD-10-CM | POA: Diagnosis not present

## 2016-02-06 DIAGNOSIS — F419 Anxiety disorder, unspecified: Secondary | ICD-10-CM | POA: Diagnosis present

## 2016-02-06 DIAGNOSIS — Y838 Other surgical procedures as the cause of abnormal reaction of the patient, or of later complication, without mention of misadventure at the time of the procedure: Secondary | ICD-10-CM | POA: Diagnosis present

## 2016-02-06 DIAGNOSIS — E1169 Type 2 diabetes mellitus with other specified complication: Secondary | ICD-10-CM | POA: Diagnosis present

## 2016-02-06 DIAGNOSIS — D735 Infarction of spleen: Secondary | ICD-10-CM | POA: Diagnosis present

## 2016-02-06 DIAGNOSIS — Z818 Family history of other mental and behavioral disorders: Secondary | ICD-10-CM

## 2016-02-06 DIAGNOSIS — A419 Sepsis, unspecified organism: Secondary | ICD-10-CM | POA: Diagnosis not present

## 2016-02-06 DIAGNOSIS — Z6841 Body Mass Index (BMI) 40.0 and over, adult: Secondary | ICD-10-CM | POA: Diagnosis not present

## 2016-02-06 DIAGNOSIS — E669 Obesity, unspecified: Secondary | ICD-10-CM | POA: Diagnosis not present

## 2016-02-06 DIAGNOSIS — R161 Splenomegaly, not elsewhere classified: Secondary | ICD-10-CM | POA: Diagnosis present

## 2016-02-06 DIAGNOSIS — I10 Essential (primary) hypertension: Secondary | ICD-10-CM | POA: Diagnosis present

## 2016-02-06 DIAGNOSIS — Z79899 Other long term (current) drug therapy: Secondary | ICD-10-CM | POA: Diagnosis not present

## 2016-02-06 DIAGNOSIS — R402414 Glasgow coma scale score 13-15, 24 hours or more after hospital admission: Secondary | ICD-10-CM | POA: Diagnosis not present

## 2016-02-06 DIAGNOSIS — K869 Disease of pancreas, unspecified: Secondary | ICD-10-CM

## 2016-02-06 DIAGNOSIS — R932 Abnormal findings on diagnostic imaging of liver and biliary tract: Secondary | ICD-10-CM | POA: Diagnosis not present

## 2016-02-06 DIAGNOSIS — K8591 Acute pancreatitis with uninfected necrosis, unspecified: Secondary | ICD-10-CM | POA: Diagnosis not present

## 2016-02-06 DIAGNOSIS — Z6837 Body mass index (BMI) 37.0-37.9, adult: Secondary | ICD-10-CM

## 2016-02-06 DIAGNOSIS — D473 Essential (hemorrhagic) thrombocythemia: Secondary | ICD-10-CM | POA: Diagnosis not present

## 2016-02-06 DIAGNOSIS — Z794 Long term (current) use of insulin: Secondary | ICD-10-CM

## 2016-02-06 DIAGNOSIS — R509 Fever, unspecified: Secondary | ICD-10-CM | POA: Diagnosis not present

## 2016-02-06 DIAGNOSIS — E119 Type 2 diabetes mellitus without complications: Secondary | ICD-10-CM | POA: Diagnosis not present

## 2016-02-06 DIAGNOSIS — R600 Localized edema: Secondary | ICD-10-CM | POA: Diagnosis not present

## 2016-02-06 DIAGNOSIS — T85520A Displacement of bile duct prosthesis, initial encounter: Principal | ICD-10-CM | POA: Diagnosis present

## 2016-02-06 DIAGNOSIS — R740 Nonspecific elevation of levels of transaminase and lactic acid dehydrogenase [LDH]: Secondary | ICD-10-CM | POA: Diagnosis not present

## 2016-02-06 DIAGNOSIS — R945 Abnormal results of liver function studies: Secondary | ICD-10-CM | POA: Diagnosis not present

## 2016-02-06 DIAGNOSIS — R933 Abnormal findings on diagnostic imaging of other parts of digestive tract: Secondary | ICD-10-CM | POA: Diagnosis not present

## 2016-02-06 DIAGNOSIS — Z833 Family history of diabetes mellitus: Secondary | ICD-10-CM | POA: Diagnosis not present

## 2016-02-06 DIAGNOSIS — D62 Acute posthemorrhagic anemia: Secondary | ICD-10-CM | POA: Diagnosis not present

## 2016-02-06 DIAGNOSIS — K863 Pseudocyst of pancreas: Secondary | ICD-10-CM | POA: Diagnosis present

## 2016-02-06 DIAGNOSIS — R188 Other ascites: Secondary | ICD-10-CM | POA: Diagnosis not present

## 2016-02-06 DIAGNOSIS — K8511 Biliary acute pancreatitis with uninfected necrosis: Secondary | ICD-10-CM | POA: Diagnosis not present

## 2016-02-06 LAB — COMPREHENSIVE METABOLIC PANEL
ALT: 127 U/L — ABNORMAL HIGH (ref 17–63)
AST: 360 U/L — ABNORMAL HIGH (ref 15–41)
Albumin: 2.4 g/dL — ABNORMAL LOW (ref 3.5–5.0)
Alkaline Phosphatase: 627 U/L — ABNORMAL HIGH (ref 38–126)
Anion gap: 9 (ref 5–15)
BUN: 10 mg/dL (ref 6–20)
CO2: 24 mmol/L (ref 22–32)
Calcium: 8.4 mg/dL — ABNORMAL LOW (ref 8.9–10.3)
Chloride: 102 mmol/L (ref 101–111)
Creatinine, Ser: 1.09 mg/dL (ref 0.61–1.24)
GFR calc Af Amer: >60 mL/min (ref >60)
GFR calc non Af Amer: >60 mL/min (ref >60)
Glucose, Bld: 80 mg/dL (ref 65–99)
Potassium: 4.1 mmol/L (ref 3.5–5.1)
Sodium: 135 mmol/L (ref 135–145)
Total Bilirubin: 3.5 mg/dL — ABNORMAL HIGH (ref 0.3–1.2)
Total Protein: 6.9 g/dL (ref 6.5–8.1)

## 2016-02-06 LAB — CBC WITH DIFFERENTIAL/PLATELET
Basophils Absolute: 0 10*3/uL (ref 0.0–0.1)
Basophils Relative: 0 %
Eosinophils Absolute: 0.1 10*3/uL (ref 0.0–0.7)
Eosinophils Relative: 1 %
HCT: 25.8 % — ABNORMAL LOW (ref 39.0–52.0)
Hemoglobin: 8.3 g/dL — ABNORMAL LOW (ref 13.0–17.0)
Lymphocytes Relative: 5 %
Lymphs Abs: 0.5 10*3/uL — ABNORMAL LOW (ref 0.7–4.0)
MCH: 29.1 pg (ref 26.0–34.0)
MCHC: 32.2 g/dL (ref 30.0–36.0)
MCV: 90.5 fL (ref 78.0–100.0)
Monocytes Absolute: 1.1 10*3/uL — ABNORMAL HIGH (ref 0.1–1.0)
Monocytes Relative: 11 %
Neutro Abs: 8.4 10*3/uL — ABNORMAL HIGH (ref 1.7–7.7)
Neutrophils Relative %: 83 %
Platelets: 422 10*3/uL — ABNORMAL HIGH (ref 150–400)
RBC: 2.85 MIL/uL — ABNORMAL LOW (ref 4.22–5.81)
RDW: 15.5 % (ref 11.5–15.5)
WBC: 10 10*3/uL (ref 4.0–10.5)

## 2016-02-06 LAB — I-STAT CG4 LACTIC ACID, ED: Lactic Acid, Venous: 1.47 mmol/L (ref 0.5–1.9)

## 2016-02-06 LAB — LIPASE, BLOOD: Lipase: 24 U/L (ref 11–51)

## 2016-02-06 MED ORDER — ACETAMINOPHEN 500 MG PO TABS
1000.0000 mg | ORAL_TABLET | Freq: Once | ORAL | Status: AC | PRN
  Administered 2016-02-06: 1000 mg via ORAL
  Filled 2016-02-06: qty 2

## 2016-02-06 MED ORDER — IOPAMIDOL (ISOVUE-300) INJECTION 61%
100.0000 mL | Freq: Once | INTRAVENOUS | Status: AC | PRN
  Administered 2016-02-06: 100 mL via INTRAVENOUS

## 2016-02-06 NOTE — ED Notes (Signed)
Pt in c/o fever onset 1 hour ago, pt is several days post-op from ERCP. Ambulatory in NAD.

## 2016-02-06 NOTE — ED Notes (Signed)
Pt given water to drink. 

## 2016-02-06 NOTE — ED Notes (Signed)
Dr. Jacubowitz in room with patient now.  

## 2016-02-06 NOTE — ED Notes (Signed)
Pt reports sudden onset fever today, approx 7pm.  Pt 6 days post-op ERCP.  Pt reports cough, occasionally productive with clear sputum.  Per pt, he continues to have fluid around his pancreas causing the cough.  Pt skin coloring yellowed, per wife, improvement upon his previously diagnosed and treated jaundice.

## 2016-02-06 NOTE — ED Provider Notes (Signed)
CSN: [id number]     Arrival date & time 02/06/16  2004 History  By signing my name below, I, Ephriam Jenkins, attest that this documentation has been prepared under the direction and in the presence of Orlie Dakin, MD. Electronically signed, Ephriam Jenkins, ED Scribe. 02/06/2016. 9:05 PM.     Chief Complaint  Patient presents with  . Fever  . Post-op Problem   The history is provided by the patient. No language interpreter was used.   HPI Comments: Timothy [name] is a 40 y.o. male with a PMHx of DM, HTN, Necrotizing Pancreatitis, Obesity, who presents to the Emergency Department complaining of a fever with associated chills onset three hours ago. Pt s had an [place] biliary stent placed on 01/31/2016;and states he felt well Until today.. Pt states he is not in any pain currently but he does feel slightly flushed in the face. Pt's last oral intake was steak, mashed sweet potatoes and vegetables; which he ate for dinner four hours ago. Pt is Diabetic and took his blood sugar before dinner and reports that it was 137. Pt reports his last bowel movement was yesterday night and states that it was a normal bowel movement. Pt states he has been seeing Dr. Michail Sermon with [facility] but has an appointment with Dr. Christy Gentles on July 28th, 2017. Pt states he is not a smoker, and denies any drug use. Pt reports that he was an occasional drinker before the endoscopy procedure but has not had a drink since the procedure. Pt denies abdominal pain, chest pain, nausea, vomiting, shortness of breath, dysuria, weakness. Pt states he had back surgery a few years ago but denies any other past surgeries. Other associated symptoms include mild cough. No treatment prior to coming here.  Past Medical History  Diagnosis Date  . Essential hypertension     started on lisinopril about 45 days prior to pancreatitis  . Pancreatitis, acute 12/22/2015    a. 11/2015 - acute severe pancreatitis with shock/sepsis, complicated by acute  respiratory failure, transaminitis, anemia of critical illness, acute encephalopathy, AKI (Cr 1.36), protein-calorie malnutrition with severe hypoalbuminemia, & hypokalemia.  Marland Kitchen AKI (acute kidney injury) ([facility]) 12/22/2015  . Anxiety   . Diabetes mellitus ([place])   . Obesity    Past Surgical History  Procedure Laterality Date  . Back surgery    . Ercp N/A 01/31/2016    Procedure: ENDOSCOPIC RETROGRADE CHOLANGIOPANCREATOGRAPHY (ERCP);  Surgeon: Carol Ada, MD;  Location: [facility] ENDOSCOPY;  Service: Endoscopy;  Laterality: N/A;   Family History  Problem Relation Age of Onset  . Heart attack Neg Hx   . Hypertension Father   . Stroke Neg Hx   . Schizophrenia Father   . Diabetes Mother    Social History  Substance Use Topics  . Smoking status: Never Smoker   . Smokeless tobacco: Never Used  . Alcohol Use: 0.0 oz/week    0 Standard drinks or equivalent per week     Comment: 1 beer per night previously - now rare use    Review of Systems  Constitutional: Positive for fever (103 at triage) and chills.  Respiratory: Positive for cough. Negative for shortness of breath.   Cardiovascular: Positive for leg swelling. Negative for chest pain.       Bilateral leg edema for several days  Gastrointestinal: Negative for nausea, vomiting and abdominal pain.  Genitourinary: Negative for dysuria.  Allergic/Immunologic: Positive for immunocompromised state.       Diabetic  Neurological: Negative for weakness.  All other systems reviewed and are negative.     Allergies  Review of patient's allergies indicates no known allergies.  Home Medications   Prior to Admission medications   Medication Sig Start Date End Date Taking? Authorizing Provider  acetaminophen (TYLENOL) 325 MG tablet Take 650 mg by mouth every 6 (six) hours as needed for fever.    Historical Provider, MD  albuterol (PROVENTIL HFA;VENTOLIN HFA) 108 (90 Base) MCG/ACT inhaler Inhale 1-2 puffs into the lungs every 6 (six) hours as  needed for wheezing or shortness of breath. 01/14/16   Ripley Fraise, MD  blood glucose meter kit and supplies Dispense based on patient and insurance preference. Use up to four times daily as directed. (FOR ICD-9 250.00, 250.01). 01/05/16   Geradine Girt, DO  carvedilol (COREG) 25 MG tablet Take 1 tablet (25 mg total) by mouth 2 (two) times daily. 01/21/16   Dayna N Dunn, PA-C  cloNIDine (CATAPRES - DOSED IN MG/24 HR) 0.2 mg/24hr patch Place 1 patch (0.2 mg total) onto the skin once a week. 01/05/16   Geradine Girt, DO  guaiFENesin-codeine 100-10 MG/5ML syrup Take 5 mLs by mouth at bedtime as needed for cough. 02/02/16   Annita Brod, MD  insulin aspart (NOVOLOG) 100 UNIT/ML FlexPen 8 units with meals (as long as eating 50%) PLUS the following scale: CBG 70 - 120: 0 units CBG 121 - 150: 2 units CBG 151 - 200: 3 units CBG 201 - 250: 5 units CBG 251 - 300: 8 units CBG 301 - 350: 11 units CBG 351 - 400: 15 units 01/05/16   Geradine Girt, DO  Insulin Glargine (LANTUS SOLOSTAR) 100 UNIT/ML Solostar Pen Inject 50 Units into the skin daily at 10 pm. 02/02/16   Annita Brod, MD  Insulin Pen Needle 31G X 5 MM MISC For insulin injections 01/05/16   Geradine Girt, DO  pantoprazole (PROTONIX) 40 MG tablet Take 1 tablet (40 mg total) by mouth at bedtime. 01/05/16   Jessica U Vann, DO   BP 138/70 mmHg  Pulse 90  Temp(Src) 103 F (39.4 C) (Oral)  Resp 20  Ht 5' 11" (1.803 m)  Wt 270 lb (122.471 kg)  BMI 37.67 kg/m2  SpO2 97% Physical Exam  Constitutional: He appears well-developed and well-nourished. No distress.  HENT:  Head: Normocephalic and atraumatic.  Mucous membranes dry  Eyes: Conjunctivae are normal. Pupils are equal, round, and reactive to light.  Neck: Neck supple. No tracheal deviation present. No thyromegaly present.  Cardiovascular: Normal rate and regular rhythm.   No murmur heard. Pulmonary/Chest: Effort normal and breath sounds normal.  Abdominal: Soft. Bowel sounds are normal.  He exhibits no distension and no mass. There is tenderness. There is no rebound and no guarding.  Obese, tender at epigastrium, right upper quadrant and left upper quadrant  Genitourinary: Penis normal.  Musculoskeletal: Normal range of motion. He exhibits edema. He exhibits no tenderness.  2+ pretibial pitting edema  Neurological: He is alert. Coordination normal.  Skin: Skin is warm and dry. No rash noted.  Psychiatric: He has a normal mood and affect.  Nursing note and vitals reviewed.  Results for orders placed or performed during the hospital encounter of 02/06/16  Comprehensive metabolic panel  Result Value Ref Range   Sodium 135 135 - 145 mmol/L   Potassium 4.1 3.5 - 5.1 mmol/L   Chloride 102 101 - 111 mmol/L   CO2 24 22 - 32 mmol/L   Glucose, Bld  80 65 - 99 mg/dL   BUN 10 6 - 20 mg/dL   Creatinine, Ser 1.09 0.61 - 1.24 mg/dL   Calcium 8.4 (L) 8.9 - 10.3 mg/dL   Total Protein 6.9 6.5 - 8.1 g/dL   Albumin 2.4 (L) 3.5 - 5.0 g/dL   AST 360 (H) 15 - 41 U/L   ALT 127 (H) 17 - 63 U/L   Alkaline Phosphatase 627 (H) 38 - 126 U/L   Total Bilirubin 3.5 (H) 0.3 - 1.2 mg/dL   GFR calc non Af Amer >60 >60 mL/min   GFR calc Af Amer >60 >60 mL/min   Anion gap 9 5 - 15  CBC with Differential/Platelet  Result Value Ref Range   WBC 10.0 4.0 - 10.5 K/uL   RBC 2.85 (L) 4.22 - 5.81 MIL/uL   Hemoglobin 8.3 (L) 13.0 - 17.0 g/dL   HCT 25.8 (L) 39.0 - 52.0 %   MCV 90.5 78.0 - 100.0 fL   MCH 29.1 26.0 - 34.0 pg   MCHC 32.2 30.0 - 36.0 g/dL   RDW 15.5 11.5 - 15.5 %   Platelets 422 (H) 150 - 400 K/uL   Neutrophils Relative % 83 %   Neutro Abs 8.4 (H) 1.7 - 7.7 K/uL   Lymphocytes Relative 5 %   Lymphs Abs 0.5 (L) 0.7 - 4.0 K/uL   Monocytes Relative 11 %   Monocytes Absolute 1.1 (H) 0.1 - 1.0 K/uL   Eosinophils Relative 1 %   Eosinophils Absolute 0.1 0.0 - 0.7 K/uL   Basophils Relative 0 %   Basophils Absolute 0.0 0.0 - 0.1 K/uL  Lipase, blood  Result Value Ref Range   Lipase 24 11 - 51  U/L  Urinalysis, Routine w reflex microscopic (not at [facility])  Result Value Ref Range   Color, Urine AMBER (A) YELLOW   APPearance CLEAR CLEAR   Specific Gravity, Urine >1.046 (H) 1.005 - 1.030   pH 8.5 (H) 5.0 - 8.0   Glucose, UA NEGATIVE NEGATIVE mg/dL   Hgb urine dipstick NEGATIVE NEGATIVE   Bilirubin Urine MODERATE (A) NEGATIVE   Ketones, ur NEGATIVE NEGATIVE mg/dL   Protein, ur 30 (A) NEGATIVE mg/dL   Nitrite NEGATIVE NEGATIVE   Leukocytes, UA NEGATIVE NEGATIVE  Urine microscopic-add on  Result Value Ref Range   Squamous Epithelial / LPF 0-5 (A) NONE SEEN   WBC, UA 0-5 0 - 5 WBC/hpf   RBC / HPF 0-5 0 - 5 RBC/hpf   Bacteria, UA NONE SEEN NONE SEEN  I-Stat CG4 Lactic Acid, ED  Result Value Ref Range   Lactic Acid, Venous 1.47 0.5 - 1.9 mmol/L   Dg Chest 2 View  02/06/2016  CLINICAL DATA:  Epigastric tenderness. History of necrotizing pancreatitis. Fever. EXAM: CHEST  2 VIEW COMPARISON:  Chest radiograph 01/30/2016, included lung bases from concurrent CT. FINDINGS: Lung volumes are low. Bibasilar and perihilar atelectasis with small left pleural effusion. Heart size is normal for technique. No pulmonary edema. No pneumothorax. IMPRESSION: Low lung volumes with perihilar and bibasilar atelectasis. Small left pleural effusion. Electronically Signed   By: Jeb Levering M.D.   On: 02/06/2016 23:37   Dg Chest 2 View  01/30/2016  CLINICAL DATA:  Cough EXAM: CHEST  2 VIEW COMPARISON:  01/13/2016 FINDINGS: Heart size and vascularity within normal limits. Mild atelectasis or scarring in the lung bases. Right perihilar atelectasis has resolved. 1 cm nodular density below the left hilum is similar to the prior study and may be due  to atelectasis overlying blood vessels. No pleural effusion. Negative for edema. IMPRESSION: Bibasilar atelectasis/ infiltrate. Left lower hilar density likely due to airspace disease. No mass lesion in this area on recent chest CT 12/21/2015 Electronically Signed   By:  Franchot Gallo M.D.   On: 01/30/2016 16:13   Dg Chest 2 View  01/13/2016  CLINICAL DATA:  The pt is c/o abd an elevated temp today with a cough. He was recently admitted for pancreatitis from gallstones. His abd feel bloated No pain EXAM: CHEST - 2 VIEW COMPARISON:  12/29/2015 FINDINGS: Patient has been extubated, the central line and nasogastric tube removed. Linear right perihilar scarring or atelectasis persists. Left lung remains clear.  Heart size normal.  No pneumothorax. No effusion. Visualized bones unremarkable. IMPRESSION: 1. Persistent linear scarring or atelectasis at the right hilum. No acute findings. Electronically Signed   By: Lucrezia Europe M.D.   On: 01/13/2016 23:00   Ct Abdomen Pelvis W Contrast  02/06/2016  CLINICAL DATA:  40 year old male with sudden onset of fever today approximately 7 p.m. Six days status post ERCP. EXAM: CT ABDOMEN AND PELVIS WITH CONTRAST TECHNIQUE: Multidetector CT imaging of the abdomen and pelvis was performed using the standard protocol following bolus administration of intravenous contrast. CONTRAST:  173m ISOVUE-300 IOPAMIDOL (ISOVUE-300) INJECTION 61% COMPARISON:  Multiple priors, most recently CT of the abdomen and pelvis 01/29/2016. FINDINGS: Lower chest: Areas of scarring and/or subsegmental atelectasis noted throughout the visualized lung bases. Trace bilateral pleural effusions lying dependently. Hepatobiliary: Moderate intrahepatic biliary ductal dilatation. No definite cystic or solid hepatic lesions. Gallbladder is moderately distended. Gallbladder wall appears mildly thickened up to 4 mm. There is some dependent material in the gallbladder which is intermediate attenuation, likely to represent noncalcified gallstones or biliary sludge balls. A biliary stent extends from the distal common bile duct into the third portion of the duodenum. Pancreas: The pancreatic parenchyma is largely replaced by a large low-attenuation fluid collection which currently  measures up to 22.6 x 13.7 x 15.3 cm, compatible with a complex pancreatic pseudocyst. This is predominantly low-attenuation, but has some internal fatty components within it, presumably from saponification. Thick rim of enhancement surrounding the pseudocyst is noted. The borders of this pseudocyst are sometimes relatively well-defined, and at other times infiltrative, particularly with respect to the root of the small bowel mesentery into which the pseudocyst appears to have invaginated downward, with several finger-like projections adjacent to the branches of the superior mesenteric artery. This large pseudocyst again exerts mass effect upon adjacent structures, including common bile duct which is displaced laterally. Spleen: Spleen is enlarged measuring 16.6 x 7.7 x 12.9 cm (estimated splenic volume of 824 mL). Adrenals/Urinary Tract: Bilateral kidneys and bilateral adrenal glands are grossly normal in appearance. No hydroureteronephrosis. Urinary bladder is normal in appearance. Stomach/Bowel: Normal appearance of the stomach. Duodenum is grossly distorted by mass effect from the large pancreatic pseudocyst such that the duodenal C-loop is draped over the portion of the pseudocyst in the region of the pancreatic head. No pathologic dilatation of small bowel or colon. The appendix is not confidently identified and may be surgically absent. Regardless, there are no inflammatory changes noted adjacent to the cecum to suggest the presence of an acute appendicitis at this time. Vascular/Lymphatic: No significant atherosclerotic disease, aneurysm or dissection identified in the abdominal or pelvic vasculature. Complete occlusion of the splenic volume again noted. At this time, the superior mesenteric vein, splenoportal confluence and portal vein appear grossly patent, although narrowed. Multiple  borderline enlarged and mildly enlarged retroperitoneal and upper abdominal lymph nodes are noted, measuring up to 1.1 cm  in short axis in the left para-aortic nodal station. Reproductive: Prostate gland and seminal vesicles are unremarkable in appearance. Other: Small volume of ascites predominantly in the pericolic gutters and low anatomic pelvis. No pneumoperitoneum. Musculoskeletal: Body wall edema. There are no aggressive appearing lytic or blastic lesions noted in the visualized portions of the skeleton. IMPRESSION: 1. Large complex pancreatic pseudocyst appears similar to the prior study, and is again compatible with necrotizing pancreatitis. This is again associated with thrombosis of the splenic vein. 2. Compared to the prior examination there has been interval placement of a biliary stent which extends from the common bile duct into the third portion of the duodenum. Despite the presence of this stent there continues to be moderate intrahepatic biliary ductal dilatation. 3. Noncalcified gallstones versus biliary sludge balls lying dependently in the gallbladder. Gallbladder wall appears mildly thickened. This is favored to be reactive secondary to inflammation from necrotizing pancreatitis, rather than indicative of underlying cholecystitis, but clinical correlation is recommended. If there is any clinical concern for acute cholecystitis, further evaluation with right upper quadrant abdominal ultrasound could be performed. 4. Splenomegaly. 5. Trace bilateral pleural effusions. 6. Additional incidental findings, as above. Electronically Signed   By: Vinnie Langton M.D.   On: 02/06/2016 23:47   Ct Abdomen Pelvis W Contrast  01/29/2016  CLINICAL DATA:  Follow-up pancreatitis. EXAM: CT ABDOMEN AND PELVIS WITH CONTRAST TECHNIQUE: Multidetector CT imaging of the abdomen and pelvis was performed using the standard protocol following bolus administration of intravenous contrast. CONTRAST:  185m ISOVUE-300 IOPAMIDOL (ISOVUE-300) INJECTION 61% COMPARISON:  CT scans since Dec 21, 2015 with the most recent comparison from January 02, 2016 FINDINGS: Mild atelectasis in the left lung base. A small calcified granuloma is seen in the right lung base. Lung bases are otherwise unchanged and unremarkable. No free air. Necrotizing pancreatitis is again identified. The pancreatic body and tail are no longer visualized due to necrosis. There is a large peripancreatic fluid collection measuring 24 x 14 cm on series 2, image 41. A few adjacent smaller collections are also seen such as anterior to the SMV on image 51. There is extensive fat stranding in the surrounding tissues. The peripancreatic fluid collection exerts mass effect on the distal common bile duct resulting in intra and extrahepatic biliary duct dilatation which is a new finding. No focal liver masses are identified. The hepatic veins are patent. There is narrowing of the portal vein as it passes by the peripancreatic collection. This narrowing is best seen on coronal images 49 through 55. No definitive portal vein thrombosis is identified at this time. The SMV remains patent. The splenic vein is no longer visualized. The gallbladder is markedly distended which correlates with the central common bile duct. The spleen is slightly more prominent in the interval but otherwise normal. Some collateral splenic vessels are identified near the splenic hilum. The adrenal glands and kidneys are normal. The abdominal aorta is normal in caliber with no atherosclerosis. No adenopathy identified. The peripancreatic fluid collection exerts mass effect on the second portion the duodenum without gastric distention. The small bowel is otherwise normal. The colon is normal. No secondary evidence of appendicitis. No adenopathy or mass in the pelvis. The bladder is normal. The prostate and seminal vesicles are unremarkable. Delayed images demonstrate no filling defects in the upper renal collecting systems. Visualized bones are unremarkable other than mild degenerative  changes in the spine. IMPRESSION: 1.  Necrotizing pancreatitis with interval development of a large 24 x 14 cm peripancreatic fluid collection. The fluid collection exerts mass effect on the central common bile duct resulting in intra and extrahepatic biliary duct dilatation and gallbladder distention, both of which are new findings. No blood flow is seen in the splenic vein. The peripancreatic collection exerts mass effect on the portal vein as it passes the collection with significant narrowing but no definitive thrombosis. These results will be called to the ordering clinician or representative by the Radiologist Assistant, and communication documented in the PACS or zVision Dashboard. Electronically Signed   By: Dorise Bullion III M.D   On: 01/29/2016 13:44   Dg Ercp  01/31/2016  CLINICAL DATA:  Biliary obstruction EXAM: ERCP TECHNIQUE: Multiple spot images obtained with the fluoroscopic device and submitted for interpretation post-procedure. FLUOROSCOPY TIME:  3 minutes 29 seconds COMPARISON:  CT 01/29/2016 FINDINGS: Two spot images document endoscopy. A plastic stent extends from the proximal CBD across the expected level of the ampulla. There is opacification of the proximal CBD, which is not visualized in the mid and distal segments. Intrahepatic ducts are incompletely opacified and visualized. IMPRESSION: Plastic biliary stent placement across the distal CBD. These images were submitted for radiologic interpretation only. Please see the procedural report for the amount of contrast and the fluoroscopy time utilized. Electronically Signed   By: Lucrezia Europe M.D.   On: 01/31/2016 09:13    ED Course  Procedures  DIAGNOSTIC STUDIES: Oxygen Saturation is 97% on RA, normal by my interpretation.  COORDINATION OF CARE: 8:57 PM-Will order CT w PO contrast. Discussed treatment plan with pt at bedside and pt agreed to plan.   Labs Review Labs Reviewed  CULTURE, BLOOD (ROUTINE X 2)  CULTURE, BLOOD (ROUTINE X 2)  COMPREHENSIVE METABOLIC PANEL   CBC WITH DIFFERENTIAL/PLATELET  LIPASE, BLOOD  URINALYSIS, ROUTINE W REFLEX MICROSCOPIC (NOT AT [facility])  I-STAT CG4 LACTIC ACID, ED    Imaging Review No results found. I have personally reviewed and evaluated these images and lab results as part of my medical decision-making.   EKG Interpretation None      12:20 AM patient resting comfortably. Denies complaint  MDM  Dr. Cristina Gong gastroenterology service consulted suggest nothing by mouth, admission to hospitalist service Rocephin 2 g IV every 24 hours he will see patient in the morning. He is concerned that biliary stent may have clogged and may need to be replaced. An is concern for impending cholangitis Patient may need repeat ERCP. Dr. Loleta Books from hospitalist service consulted. Plan transfer to [facility]. Final diagnoses:  None  Diagnosis #1 febrile illness #2  pancreatic pseudocyst #3 elevated function tests #4 anemia #5 thrombocytosis  I personally performed the services described in this documentation, which was scribed in my presence. The recorded information has been reviewed and considered.   Orlie Dakin, MD 02/07/16 [phone]

## 2016-02-06 NOTE — ED Notes (Signed)
Patient transported to CT 

## 2016-02-07 ENCOUNTER — Encounter (HOSPITAL_COMMUNITY): Payer: Self-pay | Admitting: Family Medicine

## 2016-02-07 DIAGNOSIS — R933 Abnormal findings on diagnostic imaging of other parts of digestive tract: Secondary | ICD-10-CM | POA: Diagnosis not present

## 2016-02-07 DIAGNOSIS — K869 Disease of pancreas, unspecified: Secondary | ICD-10-CM

## 2016-02-07 DIAGNOSIS — E089 Diabetes mellitus due to underlying condition without complications: Secondary | ICD-10-CM | POA: Diagnosis not present

## 2016-02-07 DIAGNOSIS — D649 Anemia, unspecified: Secondary | ICD-10-CM | POA: Diagnosis not present

## 2016-02-07 DIAGNOSIS — T85520A Displacement of bile duct prosthesis, initial encounter: Secondary | ICD-10-CM | POA: Diagnosis not present

## 2016-02-07 DIAGNOSIS — K8591 Acute pancreatitis with uninfected necrosis, unspecified: Secondary | ICD-10-CM

## 2016-02-07 DIAGNOSIS — Y838 Other surgical procedures as the cause of abnormal reaction of the patient, or of later complication, without mention of misadventure at the time of the procedure: Secondary | ICD-10-CM | POA: Diagnosis present

## 2016-02-07 DIAGNOSIS — R932 Abnormal findings on diagnostic imaging of liver and biliary tract: Secondary | ICD-10-CM | POA: Diagnosis not present

## 2016-02-07 DIAGNOSIS — D735 Infarction of spleen: Secondary | ICD-10-CM | POA: Diagnosis not present

## 2016-02-07 DIAGNOSIS — I10 Essential (primary) hypertension: Secondary | ICD-10-CM | POA: Diagnosis not present

## 2016-02-07 DIAGNOSIS — Z6837 Body mass index (BMI) 37.0-37.9, adult: Secondary | ICD-10-CM | POA: Diagnosis not present

## 2016-02-07 DIAGNOSIS — Z79899 Other long term (current) drug therapy: Secondary | ICD-10-CM | POA: Diagnosis not present

## 2016-02-07 DIAGNOSIS — Z818 Family history of other mental and behavioral disorders: Secondary | ICD-10-CM | POA: Diagnosis not present

## 2016-02-07 DIAGNOSIS — A419 Sepsis, unspecified organism: Secondary | ICD-10-CM | POA: Diagnosis present

## 2016-02-07 DIAGNOSIS — E1169 Type 2 diabetes mellitus with other specified complication: Secondary | ICD-10-CM

## 2016-02-07 DIAGNOSIS — R161 Splenomegaly, not elsewhere classified: Secondary | ICD-10-CM | POA: Diagnosis not present

## 2016-02-07 DIAGNOSIS — I959 Hypotension, unspecified: Secondary | ICD-10-CM | POA: Diagnosis not present

## 2016-02-07 DIAGNOSIS — K8511 Biliary acute pancreatitis with uninfected necrosis: Secondary | ICD-10-CM | POA: Diagnosis not present

## 2016-02-07 DIAGNOSIS — F419 Anxiety disorder, unspecified: Secondary | ICD-10-CM | POA: Diagnosis not present

## 2016-02-07 DIAGNOSIS — Z833 Family history of diabetes mellitus: Secondary | ICD-10-CM | POA: Diagnosis not present

## 2016-02-07 DIAGNOSIS — R740 Nonspecific elevation of levels of transaminase and lactic acid dehydrogenase [LDH]: Secondary | ICD-10-CM | POA: Diagnosis not present

## 2016-02-07 DIAGNOSIS — E669 Obesity, unspecified: Secondary | ICD-10-CM | POA: Diagnosis not present

## 2016-02-07 DIAGNOSIS — Z794 Long term (current) use of insulin: Secondary | ICD-10-CM | POA: Diagnosis not present

## 2016-02-07 DIAGNOSIS — K863 Pseudocyst of pancreas: Secondary | ICD-10-CM | POA: Diagnosis not present

## 2016-02-07 DIAGNOSIS — Z8249 Family history of ischemic heart disease and other diseases of the circulatory system: Secondary | ICD-10-CM | POA: Diagnosis not present

## 2016-02-07 DIAGNOSIS — R509 Fever, unspecified: Secondary | ICD-10-CM

## 2016-02-07 DIAGNOSIS — R945 Abnormal results of liver function studies: Secondary | ICD-10-CM | POA: Diagnosis not present

## 2016-02-07 LAB — COMPREHENSIVE METABOLIC PANEL
ALT: 174 U/L — ABNORMAL HIGH (ref 17–63)
AST: 547 U/L — ABNORMAL HIGH (ref 15–41)
Albumin: 2 g/dL — ABNORMAL LOW (ref 3.5–5.0)
Alkaline Phosphatase: 547 U/L — ABNORMAL HIGH (ref 38–126)
Anion gap: 8 (ref 5–15)
BUN: 9 mg/dL (ref 6–20)
CO2: 24 mmol/L (ref 22–32)
Calcium: 8.3 mg/dL — ABNORMAL LOW (ref 8.9–10.3)
Chloride: 104 mmol/L (ref 101–111)
Creatinine, Ser: 1.2 mg/dL (ref 0.61–1.24)
GFR calc Af Amer: >60 mL/min (ref >60)
GFR calc non Af Amer: >60 mL/min (ref >60)
Glucose, Bld: 110 mg/dL — ABNORMAL HIGH (ref 65–99)
Potassium: 4.1 mmol/L (ref 3.5–5.1)
Sodium: 136 mmol/L (ref 135–145)
Total Bilirubin: 4.2 mg/dL — ABNORMAL HIGH (ref 0.3–1.2)
Total Protein: 6.1 g/dL — ABNORMAL LOW (ref 6.5–8.1)

## 2016-02-07 LAB — CBC
HCT: 25.4 % — ABNORMAL LOW (ref 39.0–52.0)
Hemoglobin: 7.8 g/dL — ABNORMAL LOW (ref 13.0–17.0)
MCH: 28.1 pg (ref 26.0–34.0)
MCHC: 30.7 g/dL (ref 30.0–36.0)
MCV: 91.4 fL (ref 78.0–100.0)
Platelets: 375 10*3/uL (ref 150–400)
RBC: 2.78 MIL/uL — ABNORMAL LOW (ref 4.22–5.81)
RDW: 15.5 % (ref 11.5–15.5)
WBC: 11.9 10*3/uL — ABNORMAL HIGH (ref 4.0–10.5)

## 2016-02-07 LAB — URINALYSIS, ROUTINE W REFLEX MICROSCOPIC
Glucose, UA: NEGATIVE mg/dL
Hgb urine dipstick: NEGATIVE
Ketones, ur: NEGATIVE mg/dL
Leukocytes, UA: NEGATIVE
Nitrite: NEGATIVE
Protein, ur: 30 mg/dL — AB
Specific Gravity, Urine: >1.046 — ABNORMAL HIGH (ref 1.005–1.030)
pH: 8.5 — ABNORMAL HIGH (ref 5.0–8.0)

## 2016-02-07 LAB — LACTIC ACID, PLASMA: Lactic Acid, Venous: 1.1 mmol/L (ref 0.5–1.9)

## 2016-02-07 LAB — GLUCOSE, CAPILLARY
Glucose-Capillary: 95 mg/dL (ref 65–99)
Glucose-Capillary: 110 mg/dL — ABNORMAL HIGH (ref 65–99)
Glucose-Capillary: 208 mg/dL — ABNORMAL HIGH (ref 65–99)
Glucose-Capillary: 234 mg/dL — ABNORMAL HIGH (ref 65–99)
Glucose-Capillary: 241 mg/dL — ABNORMAL HIGH (ref 65–99)
Glucose-Capillary: 268 mg/dL — ABNORMAL HIGH (ref 65–99)

## 2016-02-07 LAB — PROTIME-INR
INR: 1.51 — ABNORMAL HIGH (ref 0.00–1.49)
Prothrombin Time: 18.3 seconds — ABNORMAL HIGH (ref 11.6–15.2)

## 2016-02-07 LAB — MRSA PCR SCREENING: MRSA by PCR: NEGATIVE

## 2016-02-07 LAB — URINE MICROSCOPIC-ADD ON: Bacteria, UA: NONE SEEN

## 2016-02-07 MED ORDER — ACETAMINOPHEN 325 MG PO TABS
650.0000 mg | ORAL_TABLET | Freq: Four times a day (QID) | ORAL | Status: DC | PRN
  Administered 2016-02-07: 650 mg via ORAL
  Filled 2016-02-07: qty 2

## 2016-02-07 MED ORDER — SODIUM CHLORIDE 0.9 % IV BOLUS (SEPSIS)
1000.0000 mL | Freq: Once | INTRAVENOUS | Status: AC
  Administered 2016-02-07: 1000 mL via INTRAVENOUS

## 2016-02-07 MED ORDER — SODIUM CHLORIDE 0.9 % IV SOLN
500.0000 mg | Freq: Four times a day (QID) | INTRAVENOUS | Status: DC
  Administered 2016-02-07 – 2016-02-09 (×8): 500 mg via INTRAVENOUS
  Filled 2016-02-07 (×11): qty 500

## 2016-02-07 MED ORDER — HYDROMORPHONE HCL 1 MG/ML IJ SOLN: 1.0000 mg | INTRAMUSCULAR | Status: DC | PRN

## 2016-02-07 MED ORDER — DEXTROSE 5 % IV SOLN
2.0000 g | Freq: Once | INTRAVENOUS | Status: AC
  Administered 2016-02-07: 2 g via INTRAVENOUS
  Filled 2016-02-07: qty 2

## 2016-02-07 MED ORDER — DEXTROSE 5 % IV SOLN
2.0000 g | INTRAVENOUS | Status: DC
  Filled 2016-02-07: qty 2

## 2016-02-07 MED ORDER — ONDANSETRON HCL 4 MG/2ML IJ SOLN
4.0000 mg | Freq: Four times a day (QID) | INTRAMUSCULAR | Status: DC | PRN
  Administered 2016-02-08 – 2016-02-09 (×2): 4 mg via INTRAVENOUS
  Filled 2016-02-07 (×2): qty 2

## 2016-02-07 MED ORDER — SODIUM CHLORIDE 0.9 % IV SOLN
Freq: Once | INTRAVENOUS | Status: AC
  Administered 2016-02-07: 01:00:00 via INTRAVENOUS

## 2016-02-07 MED ORDER — SODIUM CHLORIDE 0.9 % IV BOLUS (SEPSIS): 1000.0000 mL | Freq: Once | INTRAVENOUS | Status: DC

## 2016-02-07 MED ORDER — INSULIN ASPART 100 UNIT/ML ~~LOC~~ SOLN
0.0000 [IU] | SUBCUTANEOUS | Status: DC
  Administered 2016-02-07: 8 [IU] via SUBCUTANEOUS
  Administered 2016-02-07 (×3): 5 [IU] via SUBCUTANEOUS
  Administered 2016-02-08: 3 [IU] via SUBCUTANEOUS
  Administered 2016-02-08: 5 [IU] via SUBCUTANEOUS

## 2016-02-07 MED ORDER — GUAIFENESIN-CODEINE 100-10 MG/5ML PO SOLN
5.0000 mL | Freq: Every evening | ORAL | Status: DC | PRN
  Administered 2016-02-07 – 2016-02-08 (×2): 5 mL via ORAL
  Filled 2016-02-07 (×2): qty 5

## 2016-02-07 MED ORDER — ONDANSETRON HCL 4 MG PO TABS: 4.0000 mg | ORAL_TABLET | Freq: Four times a day (QID) | ORAL | Status: DC | PRN

## 2016-02-07 MED ORDER — SODIUM CHLORIDE 0.9 % IV BOLUS (SEPSIS): 3000.0000 mL | Freq: Once | INTRAVENOUS | Status: DC

## 2016-02-07 MED ORDER — SODIUM CHLORIDE 0.9 % IV SOLN
INTRAVENOUS | Status: DC
  Administered 2016-02-07 – 2016-02-09 (×4): via INTRAVENOUS

## 2016-02-07 MED ORDER — SODIUM CHLORIDE 0.9% FLUSH
3.0000 mL | Freq: Two times a day (BID) | INTRAVENOUS | Status: DC
  Administered 2016-02-07 – 2016-02-08 (×2): 3 mL via INTRAVENOUS

## 2016-02-07 NOTE — Progress Notes (Signed)
40 yo M with DM, HTN and recent necrotizing pancreatitis stented on 7/2 by Dr. Elnoria HowardHung presents with fever 100.5F and abdominal pain.  BP 107/64 mmHg  Pulse 83  Temp(Src) 100.3 F (37.9 C) (Oral)  Resp 32  Ht 5\' 11"  (1.803 m)  Wt 122.471 kg (270 lb)  BMI 37.67 kg/m2  SpO2 95%  WBC 10K, Hgb 8 (near recent baseline) AST/ALT 360/127 (from normal at discharge), lipase normal, Tbili 3.5 (from 3.9 at discharge)  Spoke with Dr. Matthias HughsBuccini, suspects stent not patent, "cholangitis waiting to happen".  Should have NPO and IV ceftriaxone.  GI will see.  Inpatient status, med surg.

## 2016-02-07 NOTE — Progress Notes (Signed)
Discussed tomorrow's ERCP w/ Dr. Dulce Sellarutlaw, who feels that this pt needs mgt at a tertiary medical center where they do advanced biliary endoscopy, b/c there is need for more than simply stent placement--he will need pseudocyst drainage.  Not clear whether pseudocyst is sufficiently mature to drain at this point, but it is probably best if all the decision-making is rendered by those more familiar with how to manage such a complex situation.  Accordingly, have cancelled ERCP for tomorrow, and will ask Dr. Isidoro Donningai to start moving toward transfer arrangements to a tertiary center.  Mild rise in LFT's overnight, despite antbx therapy, noted.  Florencia Reasonsobert V. Cathleen Yagi, M.D. Pager [phone] If no answer or after 5 PM call [phone]

## 2016-02-07 NOTE — Progress Notes (Addendum)
[facility]                                                                              Patient Demographics  Timothy [name], is a 40 y.o. male, DOB - March 27, 1976, RUE:[id number]  Admit date - 02/06/2016   Admitting Physician Alberteen Sam, MD  Outpatient Primary MD for the patient is Clayborn Heron, MD  Outpatient specialists:   LOS - 0  days    Chief Complaint  Patient presents with  . Fever  . Post-op Problem       Brief summary   Patient is a 40 y.o. male with history significant for recent necrotizing pancreatitis, however secondary IDDM, recent biliary stent, and pancreatic pseudocyst who presented with acute fever/chills. He had no significant medical problems until May of this year when he developed sudden onset severe epigastric pain and was admitted with pancreatitis on 5/22. This hospitalization was complicated by ARDS requiring intubation, accelerated HTN, new onset IDDM, and anemia. Suspected gallstone pancreatitis. Readmitted 2 weeks ago with jaundice and TBili >9 and was found to have a new large peripancreatic fluid collection 24 x 14 x 10 cm causing mass effect on the biliary tree. This was treated with antibiotics and then ERCP with biliary stent placed on 01/31/16 by Dr. Elnoria Howard and discharged home again on 7/4. On the night of admission, patient started having fevers and chills, epigastric and right upper quadrant abdominal pain, weakness. No nausea or vomiting. In ED patient was noted to be febrile 103F, creatinine 1.1, hemoglobin 8.3, baseline around 8. AST ALT and total bilirubin elevated. Lipase normal. CT abdomen and pelvis with contrast showed stable 22 x 13 x 15 complex pseudocyst, upstream biliary ductal dilatation, and known splenic vein thrombosis with splenomegaly. GI was consulted and patient was admitted for further workup.   Assessment & Plan    Principal Problem: SIRS in context of necrotizing pancreatitis: Concern  forcholangitis versus infected pseudocyst with biliary obstruction, elevated transaminitis and hyperbilirubinemia - Currently pain controlled, continue gentle hydration, GI consulted, possible malfunction of biliary stent due to migration or occlusion. Per GI, ERCP in a.m. - Blood cultures negative so far, continue IV antibiotics - NPO after midnight Addendum: 4:30PM Discussed with Dr Matthias Hughs who recommended transfer to tertiary care for advanced biliary endoscopy and may need pseudocyst drainage. I discussed with the patient as well who is agreeable with the plan. Per his preferance, I have contacted [facility]. Per transfer coordinator, no beds available in sdu/icu at this time but will call me back.   Addendum: 5:30pm  Discussed with hospitalist on call, Dr Gwendalyn Ege who requested GI to GI conference before accepting the patient. I have discussed with Dr Matthias Hughs. Per Dr Matthias Hughs, Dr Dulce Sellar with discuss with GI at [facility] tomorrow am and arrange for the transfer. I have relayed it to the patient transfer coordinator at [facility].   As patient is still spiking temp, I have changed abx to imipenem.    Pancreatic pseudocyst - CT abdomen and pelvis showed stable large complex pancreatic pseudocyst, will defer to GI if pseudocyst needs aspiration versus close follow-up with surgery outpatient  Pancreatitis induced diabetes:  - CBC is currently low, continue diet and sliding scale insulin  Hypotension with underlying history of Hypertension:  -BP currently stable, continue to hold carvedilol and clonidine patch    Normocytic anemia:  Stable -Trend CBC   Cough: This has been troublesome for patient, per notes. -Continue cough syrup PRN  Code Status:Full CODE STATUS  DVT Prophylaxis:  SCD's Family Communication: Discussed in detail with the patient, all imaging results, lab results explained to the patient and wife at the bedside Disposition Plan: Continue in SDU  Time  Spent in minutes   25 minutes  Procedures:  CT abd  Consultants:   GI  Antimicrobials:   IV Rocephin   Medications  Scheduled Meds: . cefTRIAXone (ROCEPHIN)  IV  2 g Intravenous Q24H  . insulin aspart  0-15 Units Subcutaneous Q4H  . sodium chloride flush  3 mL Intravenous Q12H   Continuous Infusions: . sodium chloride 125 mL/hr at 02/07/16 0345   PRN Meds:.acetaminophen, guaiFENesin-codeine, HYDROmorphone (DILAUDID) injection, ondansetron **OR** ondansetron (ZOFRAN) IV   Antibiotics   Anti-infectives    Start     Dose/Rate Route Frequency Ordered Stop   02/07/16 2000  cefTRIAXone (ROCEPHIN) 2 g in dextrose 5 % 50 mL IVPB     2 g 100 mL/hr over 30 Minutes Intravenous Every 24 hours 02/07/16 0311     02/07/16 0030  cefTRIAXone (ROCEPHIN) 2 g in dextrose 5 % 50 mL IVPB     2 g 100 mL/hr over 30 Minutes Intravenous  Once 02/07/16 0029 02/07/16 0119        Subjective:   Timothy [name] was seen and examined today.  Currently no fevers or chills, abdominal pain controlled, no nausea or vomiting. Patient denies dizziness, chest pain, shortness of breath,  new weakness, numbess, tingling. No acute events overnight.    Objective:   Filed Vitals:   02/07/16 0400 02/07/16 0430 02/07/16 0500 02/07/16 0709  BP: 116/66 111/70 109/72 121/69  Pulse: 84 82 82 82  Temp:    98 F (36.7 C)  TempSrc:    Oral  Resp: 28 30 27 30   Height:      Weight:      SpO2: 94% 95% 95% 98%    Intake/Output Summary (Last 24 hours) at 02/07/16 1106 Last data filed at 02/07/16 1000  Gross per 24 hour  Intake 1206.25 ml  Output    225 ml  Net 981.25 ml     Wt Readings from Last 3 Encounters:  02/07/16 130.5 kg (287 lb 11.2 oz)  01/29/16 119.5 kg (263 lb 7.2 oz)  01/21/16 125.193 kg (276 lb)     Exam  General: Alert and oriented x 3, NAD  HEENT:    Neck: Supple, no JVD  Cardiovascular: S1 S2 auscultated, no rubs, murmurs or gallops. Regular rate and rhythm.  Respiratory:  Clear to auscultation bilaterally, no wheezing, rales or rhonchi  Gastrointestinal: Obese, Soft, Mild abdominal tenderness in the epigastric and right upper quadrant region, distended, + bowel sounds. No rigidity or guarding.   Ext: no cyanosis clubbing or edema  Neuro: AAOx3, Cr N's II- XII. Strength 5/5 upper and lower extremities bilaterally  Skin: No rashes  Psych: Normal affect and demeanor, alert and oriented x3    Data Reviewed:  I have personally reviewed following labs and imaging studies  Micro Results Recent Results (from the past 240 hour(s))  Culture, blood (Routine X 2) w Reflex to ID Panel  Status: None (Preliminary result)   Collection Time: 02/06/16  8:50 PM  Result Value Ref Range Status   Specimen Description BLOOD RIGHT ANTECUBITAL  Final   Special Requests BOTTLES DRAWN AEROBIC AND ANAEROBIC EACH  Final   Culture PENDING  Incomplete   Report Status PENDING  Incomplete  MRSA PCR Screening     Status: None   Collection Time: 02/07/16  3:35 AM  Result Value Ref Range Status   MRSA by PCR NEGATIVE NEGATIVE Final    Comment:        The GeneXpert MRSA Assay (FDA approved for NASAL specimens only), is one component of a comprehensive MRSA colonization surveillance program. It is not intended to diagnose MRSA infection nor to guide or monitor treatment for MRSA infections.     Radiology Reports Dg Chest 2 View  02/06/2016  CLINICAL DATA:  Epigastric tenderness. History of necrotizing pancreatitis. Fever. EXAM: CHEST  2 VIEW COMPARISON:  Chest radiograph 01/30/2016, included lung bases from concurrent CT. FINDINGS: Lung volumes are low. Bibasilar and perihilar atelectasis with small left pleural effusion. Heart size is normal for technique. No pulmonary edema. No pneumothorax. IMPRESSION: Low lung volumes with perihilar and bibasilar atelectasis. Small left pleural effusion. Electronically Signed   By: Rubye Oaks M.D.   On: 02/06/2016 23:37   Dg  Chest 2 View  01/30/2016  CLINICAL DATA:  Cough EXAM: CHEST  2 VIEW COMPARISON:  01/13/2016 FINDINGS: Heart size and vascularity within normal limits. Mild atelectasis or scarring in the lung bases. Right perihilar atelectasis has resolved. 1 cm nodular density below the left hilum is similar to the prior study and may be due to atelectasis overlying blood vessels. No pleural effusion. Negative for edema. IMPRESSION: Bibasilar atelectasis/ infiltrate. Left lower hilar density likely due to airspace disease. No mass lesion in this area on recent chest CT 12/21/2015 Electronically Signed   By: Marlan Palau M.D.   On: 01/30/2016 16:13   Dg Chest 2 View  01/13/2016  CLINICAL DATA:  The pt is c/o abd an elevated temp today with a cough. He was recently admitted for pancreatitis from gallstones. His abd feel bloated No pain EXAM: CHEST - 2 VIEW COMPARISON:  12/29/2015 FINDINGS: Patient has been extubated, the central line and nasogastric tube removed. Linear right perihilar scarring or atelectasis persists. Left lung remains clear.  Heart size normal.  No pneumothorax. No effusion. Visualized bones unremarkable. IMPRESSION: 1. Persistent linear scarring or atelectasis at the right hilum. No acute findings. Electronically Signed   By: Corlis Leak M.D.   On: 01/13/2016 23:00   Ct Abdomen Pelvis W Contrast  02/06/2016  CLINICAL DATA:  40 year old male with sudden onset of fever today approximately 7 p.m. Six days status post ERCP. EXAM: CT ABDOMEN AND PELVIS WITH CONTRAST TECHNIQUE: Multidetector CT imaging of the abdomen and pelvis was performed using the standard protocol following bolus administration of intravenous contrast. CONTRAST:  ISOVUE-300 IOPAMIDOL (ISOVUE-300) INJECTION 61% COMPARISON:  Multiple priors, most recently CT of the abdomen and pelvis 01/29/2016. FINDINGS: Lower chest: Areas of scarring and/or subsegmental atelectasis noted throughout the visualized lung bases. Trace bilateral pleural  effusions lying dependently. Hepatobiliary: Moderate intrahepatic biliary ductal dilatation. No definite cystic or solid hepatic lesions. Gallbladder is moderately distended. Gallbladder wall appears mildly thickened up to 4 mm. There is some dependent material in the gallbladder which is intermediate attenuation, likely to represent noncalcified gallstones or biliary sludge balls. A biliary stent extends from the distal common bile duct  into the third portion of the duodenum. Pancreas: The pancreatic parenchyma is largely replaced by a large low-attenuation fluid collection which currently measures up to 22.6 x 13.7 x 15.3 cm, compatible with a complex pancreatic pseudocyst. This is predominantly low-attenuation, but has some internal fatty components within it, presumably from saponification. Thick rim of enhancement surrounding the pseudocyst is noted. The borders of this pseudocyst are sometimes relatively well-defined, and at other times infiltrative, particularly with respect to the root of the small bowel mesentery into which the pseudocyst appears to have invaginated downward, with several finger-like projections adjacent to the branches of the superior mesenteric artery. This large pseudocyst again exerts mass effect upon adjacent structures, including common bile duct which is displaced laterally. Spleen: Spleen is enlarged measuring 16.6 x 7.7 x 12.9 cm (estimated splenic volume of 824 mL). Adrenals/Urinary Tract: Bilateral kidneys and bilateral adrenal glands are grossly normal in appearance. No hydroureteronephrosis. Urinary bladder is normal in appearance. Stomach/Bowel: Normal appearance of the stomach. Duodenum is grossly distorted by mass effect from the large pancreatic pseudocyst such that the duodenal C-loop is draped over the portion of the pseudocyst in the region of the pancreatic head. No pathologic dilatation of small bowel or colon. The appendix is not confidently identified and may be  surgically absent. Regardless, there are no inflammatory changes noted adjacent to the cecum to suggest the presence of an acute appendicitis at this time. Vascular/Lymphatic: No significant atherosclerotic disease, aneurysm or dissection identified in the abdominal or pelvic vasculature. Complete occlusion of the splenic volume again noted. At this time, the superior mesenteric vein, splenoportal confluence and portal vein appear grossly patent, although narrowed. Multiple borderline enlarged and mildly enlarged retroperitoneal and upper abdominal lymph nodes are noted, measuring up to 1.1 cm in short axis in the left para-aortic nodal station. Reproductive: Prostate gland and seminal vesicles are unremarkable in appearance. Other: Small volume of ascites predominantly in the pericolic gutters and low anatomic pelvis. No pneumoperitoneum. Musculoskeletal: Body wall edema. There are no aggressive appearing lytic or blastic lesions noted in the visualized portions of the skeleton. IMPRESSION: 1. Large complex pancreatic pseudocyst appears similar to the prior study, and is again compatible with necrotizing pancreatitis. This is again associated with thrombosis of the splenic vein. 2. Compared to the prior examination there has been interval placement of a biliary stent which extends from the common bile duct into the third portion of the duodenum. Despite the presence of this stent there continues to be moderate intrahepatic biliary ductal dilatation. 3. Noncalcified gallstones versus biliary sludge balls lying dependently in the gallbladder. Gallbladder wall appears mildly thickened. This is favored to be reactive secondary to inflammation from necrotizing pancreatitis, rather than indicative of underlying cholecystitis, but clinical correlation is recommended. If there is any clinical concern for acute cholecystitis, further evaluation with right upper quadrant abdominal ultrasound could be performed. 4.  Splenomegaly. 5. Trace bilateral pleural effusions. 6. Additional incidental findings, as above. Electronically Signed   By: Trudie Reed M.D.   On: 02/06/2016 23:47   Ct Abdomen Pelvis W Contrast  01/29/2016  CLINICAL DATA:  Follow-up pancreatitis. EXAM: CT ABDOMEN AND PELVIS WITH CONTRAST TECHNIQUE: Multidetector CT imaging of the abdomen and pelvis was performed using the standard protocol following bolus administration of intravenous contrast. CONTRAST:  ISOVUE-300 IOPAMIDOL (ISOVUE-300) INJECTION 61% COMPARISON:  CT scans since Dec 21, 2015 with the most recent comparison from January 02, 2016 FINDINGS: Mild atelectasis in the left lung base. A small calcified granuloma  is seen in the right lung base. Lung bases are otherwise unchanged and unremarkable. No free air. Necrotizing pancreatitis is again identified. The pancreatic body and tail are no longer visualized due to necrosis. There is a large peripancreatic fluid collection measuring 24 x 14 cm on series 2, image 41. A few adjacent smaller collections are also seen such as anterior to the SMV on image 51. There is extensive fat stranding in the surrounding tissues. The peripancreatic fluid collection exerts mass effect on the distal common bile duct resulting in intra and extrahepatic biliary duct dilatation which is a new finding. No focal liver masses are identified. The hepatic veins are patent. There is narrowing of the portal vein as it passes by the peripancreatic collection. This narrowing is best seen on coronal images 49 through 55. No definitive portal vein thrombosis is identified at this time. The SMV remains patent. The splenic vein is no longer visualized. The gallbladder is markedly distended which correlates with the central common bile duct. The spleen is slightly more prominent in the interval but otherwise normal. Some collateral splenic vessels are identified near the splenic hilum. The adrenal glands and kidneys are normal.  The abdominal aorta is normal in caliber with no atherosclerosis. No adenopathy identified. The peripancreatic fluid collection exerts mass effect on the second portion the duodenum without gastric distention. The small bowel is otherwise normal. The colon is normal. No secondary evidence of appendicitis. No adenopathy or mass in the pelvis. The bladder is normal. The prostate and seminal vesicles are unremarkable. Delayed images demonstrate no filling defects in the upper renal collecting systems. Visualized bones are unremarkable other than mild degenerative changes in the spine. IMPRESSION: 1. Necrotizing pancreatitis with interval development of a large 24 x 14 cm peripancreatic fluid collection. The fluid collection exerts mass effect on the central common bile duct resulting in intra and extrahepatic biliary duct dilatation and gallbladder distention, both of which are new findings. No blood flow is seen in the splenic vein. The peripancreatic collection exerts mass effect on the portal vein as it passes the collection with significant narrowing but no definitive thrombosis. These results will be called to the ordering clinician or representative by the Radiologist Assistant, and communication documented in the PACS or zVision Dashboard. Electronically Signed   By: Gerome Samavid  Williams III M.D   On: 01/29/2016 13:44   Dg Ercp  01/31/2016  CLINICAL DATA:  Biliary obstruction EXAM: ERCP TECHNIQUE: Multiple spot images obtained with the fluoroscopic device and submitted for interpretation post-procedure. FLUOROSCOPY TIME:  3 minutes 29 seconds COMPARISON:  CT 01/29/2016 FINDINGS: Two spot images document endoscopy. A plastic stent extends from the proximal CBD across the expected level of the ampulla. There is opacification of the proximal CBD, which is not visualized in the mid and distal segments. Intrahepatic ducts are incompletely opacified and visualized. IMPRESSION: Plastic biliary stent placement across the  distal CBD. These images were submitted for radiologic interpretation only. Please see the procedural report for the amount of contrast and the fluoroscopy time utilized. Electronically Signed   By: Corlis Leak  Hassell M.D.   On: 01/31/2016 09:13    Lab Data:  CBC:  Recent Labs Lab 02/01/16 0550 02/02/16 0528 02/06/16 2050 02/07/16 0700  WBC 6.1 8.4 10.0 11.9*  NEUTROABS 4.7 6.5 8.4*  --   HGB 7.4* 8.2* 8.3* 7.8*  HCT 23.4* 26.2* 25.8* 25.4*  MCV 91.8 92.3 90.5 91.4  PLT 333 406* 422* 375   Basic Metabolic Panel:  Recent  Labs Lab 02/01/16 0550 02/02/16 0528 02/06/16 2050 02/07/16 0700  NA 138 134* 135 136  K 4.6 4.3 4.1 4.1  CL 95* 102 102 104  CO2 25 25 24 24   GLUCOSE 112* 67 80 110*  BUN 8 6 10 9   CREATININE 1.24 1.10 1.09 1.20  CALCIUM 9.2 8.3* 8.4* 8.3*   GFR: Estimated Creatinine Clearance: 111.6 mL/min (by C-G formula based on Cr of 1.2). Liver Function Tests:  Recent Labs Lab 02/01/16 0550 02/02/16 0528 02/06/16 2050 02/07/16 0700  AST 33 31 360* 547*  ALT 62 48 127* 174*  ALKPHOS 257* 233* 627* 547*  BILITOT 4.9* 3.9* 3.5* 4.2*  PROT 5.6* 6.1* 6.9 6.1*  ALBUMIN 1.9* 2.0* 2.4* 2.0*    Recent Labs Lab 02/06/16 2050  LIPASE 24   No results for input(s): AMMONIA in the last 168 hours. Coagulation Profile:  Recent Labs Lab 02/07/16 0700  INR 1.51*   Cardiac Enzymes: No results for input(s): CKTOTAL, CKMB, CKMBINDEX, TROPONINI in the last 168 hours. BNP (last 3 results) No results for input(s): PROBNP in the last 8760 hours. HbA1C: No results for input(s): HGBA1C in the last 72 hours. CBG:  Recent Labs Lab 02/01/16 2339 02/02/16 0422 02/02/16 0750 02/07/16 0341 02/07/16 0708  GLUCAP 135* 71 86 95 110*   Lipid Profile: No results for input(s): CHOL, HDL, LDLCALC, TRIG, CHOLHDL, LDLDIRECT in the last 72 hours. Thyroid Function Tests: No results for input(s): TSH, T4TOTAL, FREET4, T3FREE, THYROIDAB in the last 72 hours. Anemia Panel: No  results for input(s): VITAMINB12, FOLATE, FERRITIN, TIBC, IRON, RETICCTPCT in the last 72 hours. Urine analysis:    Component Value Date/Time   COLORURINE AMBER* 02/07/2016 0001   APPEARANCEUR CLEAR 02/07/2016 0001   LABSPEC >1.046* 02/07/2016 0001   PHURINE 8.5* 02/07/2016 0001   GLUCOSEU NEGATIVE 02/07/2016 0001   HGBUR NEGATIVE 02/07/2016 0001   BILIRUBINUR MODERATE* 02/07/2016 0001   KETONESUR NEGATIVE 02/07/2016 0001   PROTEINUR 30* 02/07/2016 0001   NITRITE NEGATIVE 02/07/2016 0001   LEUKOCYTESUR NEGATIVE 02/07/2016 0001     Timothy [name] M.D. [facility] 02/07/2016, 11:06 AM  Pager: [phone] Between 7am to 7pm - call Pager - [phone]  After 7pm go to [email or web address] - password [email or web address]  Call night coverage person covering after 7pm

## 2016-02-07 NOTE — ED Notes (Addendum)
MD at bedside discussing test results and plan of care for consult and possible admission.

## 2016-02-07 NOTE — ED Notes (Signed)
MD at bedside discussing consult results and plan of care.

## 2016-02-07 NOTE — H&P (Signed)
History and Physical  Patient Name: Timothy [name]     EHM:[id number]    DOB: Aug 27, 1975    DOA: 02/06/2016 PCP: Aretta Nip, MD   Patient coming from: Home  Chief Complaint: Fever/chills  HPI: Timothy [name] is a 40 y.o. male with a past medical history significant for recent necrotizing pancreatitis, however secondary IDDM, recent biliary stent, and pancreatic pseudocyst who presents with acute fever/chills.  The patient was a healthy individual with no medical problems until May of this year when he developed sudden onset severe epigastric pain and was admitted with pancreatitis on 5/22.  This hospitalization was complicated by ARDS requiring intubation, accelerated HTN, new onset IDDM, and anemia.  Suspected gallstone pancreatitis.  Readmitted 2 weeks ago with jaundice and TBili >9 and was found to have a new large peripancreatic fluid collection 24 x 14 x 10 cm causing mass effect on the biliary tree.  This was treated with antibiotics and then ERCP with biliary stent placed on 01/31/16 by Dr. Benson Norway and discharged home again on 7/4.  Since getting home, he has felt tired but recuperating until tonight after supper he had onset of chills.  Measured his temperature and it was 100F, and his wife thought he was more jaundiced, so he went to the ER.  Still has low grade epigastric and RUQ abdominal pain, bloating, weakness/debility, and cough but no vomiting, confusion, syncope, dizziness.    ED course: -Febrile to 103F, initially without tachycardia or tachypnea, and saturating well on room air -Na 135, K 4.1, Cr 1.1 (baseline 1.1), WBC 10K, Hgb 8.3 (baseline around 8 since second hospitalization) -AST/ALT 360/127 (from normal at discharge), lipase normal, Tbili 3.5 (from 3.9 at discharge) -Lipase normal -CT abdomen and pelvis with contrast showed stable 22 x 13 x 15 complex pseudocyst, upstream biliary ductal dilatation, and known splenic vein thrombosis with splenomegaly. -The case was  discussed with Dr. Cristina Gong from [place] who recommended antibiotics and admission for possible cholangitis versus infected pseudocyst      ROS: All other systems negative except as just noted or noted in the history of present illness.    Past Medical History  Diagnosis Date  . Essential hypertension     started on lisinopril about 45 days prior to pancreatitis  . Pancreatitis, acute 12/22/2015    a. 11/2015 - acute severe pancreatitis with shock/sepsis, complicated by acute respiratory failure, transaminitis, anemia of critical illness, acute encephalopathy, AKI (Cr 1.36), protein-calorie malnutrition with severe hypoalbuminemia, & hypokalemia.  Marland Kitchen AKI (acute kidney injury) ([place]) 12/22/2015  . Anxiety   . Diabetes mellitus ([place])   . Obesity     Past Surgical History  Procedure Laterality Date  . Back surgery    . Ercp N/A 01/31/2016    Procedure: ENDOSCOPIC RETROGRADE CHOLANGIOPANCREATOGRAPHY (ERCP);  Surgeon: Carol Ada, MD;  Location: [facility] ENDOSCOPY;  Service: Endoscopy;  Laterality: N/A;    Social History: Patient lives with his wife.  The patient walks unassisted.  He works for the [employer].  Nonsmoker.  Previous rare alcohol , now none.    No Known Allergies  Family history: family history includes Diabetes in his mother; Hypertension in his father; Schizophrenia in his father. There is no history of Heart attack or Stroke.  Prior to Admission medications   Medication Sig Start Date End Date Taking? Authorizing Provider  acetaminophen (TYLENOL) 325 MG tablet Take 650 mg by mouth every 6 (six) hours as needed for fever.    Historical Provider, MD  albuterol (PROVENTIL HFA;VENTOLIN HFA) 108 (90 Base) MCG/ACT inhaler Inhale 1-2 puffs into the lungs every 6 (six) hours as needed for wheezing or shortness of breath. 01/14/16   Ripley Fraise, MD  blood glucose meter kit and supplies Dispense based on patient and insurance preference. Use up to four times daily  as directed. (FOR ICD-9 250.00, 250.01). 01/05/16   Geradine Girt, DO  carvedilol (COREG) 25 MG tablet Take 1 tablet (25 mg total) by mouth 2 (two) times daily. 01/21/16   Dayna N Dunn, PA-C  cloNIDine (CATAPRES - DOSED IN MG/24 HR) 0.2 mg/24hr patch Place 1 patch (0.2 mg total) onto the skin once a week. 01/05/16   Geradine Girt, DO  guaiFENesin-codeine 100-10 MG/5ML syrup Take 5 mLs by mouth at bedtime as needed for cough. 02/02/16   Annita Brod, MD  insulin aspart (NOVOLOG) 100 UNIT/ML FlexPen 8 units with meals (as long as eating 50%) PLUS the following scale: CBG 70 - 120: 0 units CBG 121 - 150: 2 units CBG 151 - 200: 3 units CBG 201 - 250: 5 units CBG 251 - 300: 8 units CBG 301 - 350: 11 units CBG 351 - 400: 15 units 01/05/16   Geradine Girt, DO  Insulin Glargine (LANTUS SOLOSTAR) 100 UNIT/ML Solostar Pen Inject 50 Units into the skin daily at 10 pm. 02/02/16   Annita Brod, MD  Insulin Pen Needle 31G X 5 MM MISC For insulin injections 01/05/16   Geradine Girt, DO  pantoprazole (PROTONIX) 40 MG tablet Take 1 tablet (40 mg total) by mouth at bedtime. 01/05/16   Geradine Girt, DO       Physical Exam: BP 83/36 mmHg  Pulse 81  Temp(Src) 101.6 F (38.7 C) (Oral)  Resp 19  Ht '5\' 11"'  (1.803 m)  Wt 129.094 kg (284 lb 9.6 oz)  BMI 39.71 kg/m2  SpO2 98% General appearance: Obese adult male, alert and in no acute distress but tired.   Eyes: Conjunctiva normal, lids and lashes normal.   PERRL.  ENT: No nasal deformity, discharge.  OP moist without lesions.   Lymph: No cervical or supraclavicular lymphadenopathy. Skin: Warm, dry.  Pallor.  Mild jaundice, notable in mouth.  No suspicious rashes or lesions. Cardiac: Tachycardic, nl S1-S2, no murmurs appreciated.  Capillary refill is brisk.  JVP normal.  Marked 3+ LE edema.  Radial pulses 2+ and symmetric. Respiratory: Normal respiratory rate and rhythm.  CTAB without rales or wheezes. GI: Abdomen soft without rigidity.  There is moderate  TTP in epigastrium and RUQ. Not able to appreciate hepatosplenomegaly given tenderness and also body habitus.   MSK: No deformities or effusions.  No clubbing/cyanosis. Neuro: Cranial nerves normal.  Oriented to location, time, situation.  Sensorium intact and responding to questions, attention normal.  Speech is fluent.  Moves all extremities equally and with normal coordination.    Psych: Affect blunted, tiired.  Judgment and insight appear normal.       Labs on Admission:  I have personally reviewed following labs and imaging studies: CBC:  Recent Labs Lab 01/31/16 0437 02/01/16 0550 02/02/16 0528 02/06/16 2050  WBC 5.9 6.1 8.4 10.0  NEUTROABS 4.3 4.7 6.5 8.4*  HGB 7.3* 7.4* 8.2* 8.3*  HCT 22.8* 23.4* 26.2* 25.8*  MCV 90.1 91.8 92.3 90.5  PLT 367 333 406* 785*   Basic Metabolic Panel:  Recent Labs Lab 01/31/16 0437 02/01/16 0550 02/02/16 0528 02/06/16 2050  NA 135 138 134* 135  K  3.4* 4.6 4.3 4.1  CL 101 95* 102 102  CO2 '25 25 25 24  ' GLUCOSE 74 112* 67 80  BUN '9 8 6 10  ' CREATININE 1.16 1.24 1.10 1.09  CALCIUM 8.2* 9.2 8.3* 8.4*   GFR: Estimated Creatinine Clearance: 122.1 mL/min (by C-G formula based on Cr of 1.09).  Liver Function Tests:  Recent Labs Lab 01/31/16 0437 02/01/16 0550 02/02/16 0528 02/06/16 2050  AST 71* 33 31 360*  ALT 91* 62 48 127*  ALKPHOS 307* 257* 233* 627*  BILITOT 9.5* 4.9* 3.9* 3.5*  PROT 5.9* 5.6* 6.1* 6.9  ALBUMIN 1.8* 1.9* 2.0* 2.4*    Recent Labs Lab 02/06/16 2050  LIPASE 24   No results for input(s): AMMONIA in the last 168 hours. Coagulation Profile:  Recent Labs Lab 01/31/16 0437  INR 1.40  CBG:  Recent Labs Lab 02/01/16 1648 02/01/16 1943 02/01/16 2339 02/02/16 0422 02/02/16 0750  GLUCAP 163* 176* 135* 71 86   Sepsis Labs: Lactic acid 1.5 mmol/L at OSH   Invalid input(s): PROCALCITONIN, LACTICIDVEN Recent Results (from the past 240 hour(s))  Culture, blood (Routine X 2) w Reflex to ID Panel      Status: None (Preliminary result)   Collection Time: 02/06/16  8:50 PM  Result Value Ref Range Status   Specimen Description BLOOD RIGHT ANTECUBITAL  Final   Special Requests BOTTLES DRAWN AEROBIC AND ANAEROBIC 5ML EACH  Final   Culture PENDING  Incomplete   Report Status PENDING  Incomplete         Radiological Exams on Admission: Personally reviewed: Dg Chest 2 View  02/06/2016  CLINICAL DATA:  Epigastric tenderness. History of necrotizing pancreatitis. Fever. EXAM: CHEST  2 VIEW COMPARISON:  Chest radiograph 01/30/2016, included lung bases from concurrent CT. FINDINGS: Lung volumes are low. Bibasilar and perihilar atelectasis with small left pleural effusion. Heart size is normal for technique. No pulmonary edema. No pneumothorax. IMPRESSION: Low lung volumes with perihilar and bibasilar atelectasis. Small left pleural effusion. Electronically Signed   By: Jeb Levering M.D.   On: 02/06/2016 23:37   Ct Abdomen Pelvis W Contrast  02/06/2016  CLINICAL DATA:  40 year old male with sudden onset of fever today approximately 7 p.m. Six days status post ERCP. EXAM: CT ABDOMEN AND PELVIS WITH CONTRAST TECHNIQUE: Multidetector CT imaging of the abdomen and pelvis was performed using the standard protocol following bolus administration of intravenous contrast. CONTRAST:  161m ISOVUE-300 IOPAMIDOL (ISOVUE-300) INJECTION 61% COMPARISON:  Multiple priors, most recently CT of the abdomen and pelvis 01/29/2016. FINDINGS: Lower chest: Areas of scarring and/or subsegmental atelectasis noted throughout the visualized lung bases. Trace bilateral pleural effusions lying dependently. Hepatobiliary: Moderate intrahepatic biliary ductal dilatation. No definite cystic or solid hepatic lesions. Gallbladder is moderately distended. Gallbladder wall appears mildly thickened up to 4 mm. There is some dependent material in the gallbladder which is intermediate attenuation, likely to represent noncalcified gallstones or  biliary sludge balls. A biliary stent extends from the distal common bile duct into the third portion of the duodenum. Pancreas: The pancreatic parenchyma is largely replaced by a large low-attenuation fluid collection which currently measures up to 22.6 x 13.7 x 15.3 cm, compatible with a complex pancreatic pseudocyst. This is predominantly low-attenuation, but has some internal fatty components within it, presumably from saponification. Thick rim of enhancement surrounding the pseudocyst is noted. The borders of this pseudocyst are sometimes relatively well-defined, and at other times infiltrative, particularly with respect to the root of the small bowel mesentery into  which the pseudocyst appears to have invaginated downward, with several finger-like projections adjacent to the branches of the superior mesenteric artery. This large pseudocyst again exerts mass effect upon adjacent structures, including common bile duct which is displaced laterally. Spleen: Spleen is enlarged measuring 16.6 x 7.7 x 12.9 cm (estimated splenic volume of 824 mL). Adrenals/Urinary Tract: Bilateral kidneys and bilateral adrenal glands are grossly normal in appearance. No hydroureteronephrosis. Urinary bladder is normal in appearance. Stomach/Bowel: Normal appearance of the stomach. Duodenum is grossly distorted by mass effect from the large pancreatic pseudocyst such that the duodenal C-loop is draped over the portion of the pseudocyst in the region of the pancreatic head. No pathologic dilatation of small bowel or colon. The appendix is not confidently identified and may be surgically absent. Regardless, there are no inflammatory changes noted adjacent to the cecum to suggest the presence of an acute appendicitis at this time. Vascular/Lymphatic: No significant atherosclerotic disease, aneurysm or dissection identified in the abdominal or pelvic vasculature. Complete occlusion of the splenic volume again noted. At this time, the  superior mesenteric vein, splenoportal confluence and portal vein appear grossly patent, although narrowed. Multiple borderline enlarged and mildly enlarged retroperitoneal and upper abdominal lymph nodes are noted, measuring up to 1.1 cm in short axis in the left para-aortic nodal station. Reproductive: Prostate gland and seminal vesicles are unremarkable in appearance. Other: Small volume of ascites predominantly in the pericolic gutters and low anatomic pelvis. No pneumoperitoneum. Musculoskeletal: Body wall edema. There are no aggressive appearing lytic or blastic lesions noted in the visualized portions of the skeleton. IMPRESSION: 1. Large complex pancreatic pseudocyst appears similar to the prior study, and is again compatible with necrotizing pancreatitis. This is again associated with thrombosis of the splenic vein. 2. Compared to the prior examination there has been interval placement of a biliary stent which extends from the common bile duct into the third portion of the duodenum. Despite the presence of this stent there continues to be moderate intrahepatic biliary ductal dilatation. 3. Noncalcified gallstones versus biliary sludge balls lying dependently in the gallbladder. Gallbladder wall appears mildly thickened. This is favored to be reactive secondary to inflammation from necrotizing pancreatitis, rather than indicative of underlying cholecystitis, but clinical correlation is recommended. If there is any clinical concern for acute cholecystitis, further evaluation with right upper quadrant abdominal ultrasound could be performed. 4. Splenomegaly. 5. Trace bilateral pleural effusions. 6. Additional incidental findings, as above. Electronically Signed   By: Vinnie Langton M.D.   On: 02/06/2016 23:47      Assessment/Plan 1. Possible sepsis in context of necrotizing pancreatitis:  Infected pseudocyst versus cholangitis, not sure that there is pancreas left to be inflamed presently.  Patient  meets SIRS criteria given tachycardia, tachypnea, fever.  However, although the patient is noted to have a SBP < 90 once, with the current information available to me, I do not think the patient is in septic shock. The SBPs <90 is related to a medication clonidine and normal for the patient whose BP typically has been 250-539 systolic since discharge.  Antibiotics delivered in the ED.    -Sepsis bundle utilized:  -Blood cultures drawn  -30 ml/kg bolus given at recognition of sepsis with shock, will repeat lactic acid  -Start targeted antibiotics with ceftriaxone 2 g daily per GI    -Repeat renal function and complete blood count in AM  -NPO  -Give 30 cc/kg bolus and then hold further maintenance for now    2. Pancreatitis induced  diabetes:  Blood glucose 80 at admission -Hold home Lantus unless glucose starts to rise -CBG q6hrs with SSI  3. Hypertension:  Hypotensive. -Hold carvedilol -Clonidine patch removed at admission  4. Normocytic anemia:  Stable -Trend CBC  5. Cough: This has been troublesome for patient, per notes. -Continue cough syrup PRN      DVT prophylaxis: SCDs in case needs procedure  Code Status: FULL  Family Communication: None present  Disposition Plan: Anticipate IV fluids, monitor in stepdown given dropping blood pressure Consults called: Gastroenterology Admission status: INPATIENT, stepdown     Medical decision making: Patient seen at 3:12 AM on 02/07/2016.  The patient was discussed with Dr. Winfred Leeds. What exists of the patient's chart was reviewed in depth.  Clinical condition: requiring aggressive fluids and close monitoring of BP in stepdown unit.        Edwin Dada [facility] Pager [phone]

## 2016-02-07 NOTE — Progress Notes (Signed)
NURSING PROGRESS NOTE  Timothy [name] [id number] Admission Data: 02/07/2016 3:11 AM Attending Provider: Alberteen Samhristopher P Danford, MD NWG:[id number]:Victoria R Rankins, MD Code Status: Full  Timothy [name] is a 40 y.o. male patient admitted from ED:  -No acute distress noted.  -No complaints of shortness of breath.  -No complaints of chest pain.   Cardiac Monitoring: Box # 27 in place. Cardiac monitor yields:normal sinus rhythm.  Blood pressure 83/36, pulse 81, temperature 101.6 F (38.7 C), temperature source Oral, resp. rate 19, height 5\' 11"  (1.803 m), weight 129.094 kg (284 lb 9.6 oz), SpO2 98 %.   IV Fluids:  IV in place, occlusive dsg intact without redness, IV cath antecubital right, condition patent and no redness normal saline.   Allergies:  Review of patient's allergies indicates no known allergies.  Past Medical History:   has a past medical history of Essential hypertension; Pancreatitis, acute (12/22/2015); AKI (acute kidney injury) ([facility]) (12/22/2015); Anxiety; Diabetes mellitus ([facility]); and Obesity.  Past Surgical History:   has past surgical history that includes Back surgery and ERCP (N/A, 01/31/2016).  Social History:   reports that he has never smoked. He has never used smokeless tobacco. He reports that he drinks alcohol. He reports that he does not use illicit drugs.  Skin: intact, chest reddened, jaundice, pale  Patient/Family orientated to room. Information packet given to patient/family. Admission inpatient armband information verified with patient/family to include name and date of birth and placed on patient arm. Side rails up x 2, fall assessment and education completed with patient/family. Patient/family able to verbalize understanding of risk associated with falls and verbalized understanding to call for assistance before getting out of bed. Call light within reach. Patient/family able to voice and demonstrate understanding of unit orientation instructions.    -->-->Pt  arrived to unit with rectal temp of 101.6 and BP of 83/36. Paged Dr. Maryfrances Bunnellanford for orders and sepsis protocol. New order placed to transfer pt to stepdown unit. Patient and family has been updated. Report given to nurse Wylene Men([place]) [facility]. Will transfer patient to new unit.

## 2016-02-07 NOTE — Consult Note (Signed)
Referring Provider:   Tyler Pita, MD Primary Care Physician:  Aretta Nip, MD Primary Gastroenterologist:  None (unassigned)  Reason for Consultation:  Elevated liver chemistries and fever status post biliary stenting  HPI: Timothy [name] is a 40 y.o. male who had necrotizing pancreatitis in May of this year and who presented a couple of weeks ago with biliary obstruction from pancreatic inflammation, necessitating placement of a 8.5 [place] biliary stent by Dr. Carol Ada on July 2. At that time, anatomic distortion precluded performance of the sphincterotomy, so a larger stent was not able to be placed.  Nonetheless, following the procedure, there was a nice improvement in his liver chemistries, with his bilirubin dropping from 9.5 to 3.9 following the procedure, and transaminases were normal at the time of hospital discharge. He was feeling well at that time, and remained pain-free and without problems until last night.  Yesterday, he developed fevers and chills, but without abdominal pain, and presented to the [facility], where his temperature was 103. Blood work showed borderline elevation of white count at 10,000, and a bump in his liver chemistries. Specifically, his AST went from 31 on July 4 to 360 last night, and his ALT went from 48 to 127 during the same interval. Alkaline phosphatase rose from 233 to 627, but bilirubin was actually slightly improved at 3.5.  A CT scan obtained last night showed a 20 cm complex pancreatic pseudocyst impinging on the bile duct, and the biliary stent may have migrated distally, in that its distal portion was felt to be in the third portion of the duodenum; there was still moderate intrahepatic biliary ductal dilatation present. The gallbladder had material consistent with sludge or stones. Splenomegaly was also noted, perhaps a consequence of his previously documented splenic vein thrombosis.   Based on this information, it was my opinion  that the patient probably had malfunction of his stent, whether from migration or occlusion, so I recommended that the patient be admitted for further management.  Overnight, he has felt well, on Rocephin. No further fevers or chills. At no time did he have abdominal pain.   Past Medical History  Diagnosis Date  . Essential hypertension     started on lisinopril about 45 days prior to pancreatitis  . Pancreatitis, acute 12/22/2015    a. 11/2015 - acute severe pancreatitis with shock/sepsis, complicated by acute respiratory failure, transaminitis, anemia of critical illness, acute encephalopathy, AKI (Cr 1.36), protein-calorie malnutrition with severe hypoalbuminemia, & hypokalemia.  Marland Kitchen AKI (acute kidney injury) ([place]) 12/22/2015  . Anxiety   . Diabetes mellitus ([place])   . Obesity     Past Surgical History  Procedure Laterality Date  . Back surgery    . Ercp N/A 01/31/2016    Procedure: ENDOSCOPIC RETROGRADE CHOLANGIOPANCREATOGRAPHY (ERCP);  Surgeon: Carol Ada, MD;  Location: [facility] ENDOSCOPY;  Service: Endoscopy;  Laterality: N/A;    Prior to Admission medications   Medication Sig Start Date End Date Taking? Authorizing Provider  acetaminophen (TYLENOL) 325 MG tablet Take 650 mg by mouth every 6 (six) hours as needed for fever.    Historical Provider, MD  albuterol (PROVENTIL HFA;VENTOLIN HFA) 108 (90 Base) MCG/ACT inhaler Inhale 1-2 puffs into the lungs every 6 (six) hours as needed for wheezing or shortness of breath. 01/14/16   Ripley Fraise, MD  blood glucose meter kit and supplies Dispense based on patient and insurance preference. Use up to four times daily as directed. (FOR ICD-9 250.00, 250.01). 01/05/16  Geradine Girt, DO  carvedilol (COREG) 25 MG tablet Take 1 tablet (25 mg total) by mouth 2 (two) times daily. 01/21/16   Dayna N Dunn, PA-C  cloNIDine (CATAPRES - DOSED IN MG/24 HR) 0.2 mg/24hr patch Place 1 patch (0.2 mg total) onto the skin once a week. 01/05/16   Geradine Girt, DO   guaiFENesin-codeine 100-10 MG/5ML syrup Take 5 mLs by mouth at bedtime as needed for cough. 02/02/16   Annita Brod, MD  insulin aspart (NOVOLOG) 100 UNIT/ML FlexPen 8 units with meals (as long as eating 50%) PLUS the following scale: CBG 70 - 120: 0 units CBG 121 - 150: 2 units CBG 151 - 200: 3 units CBG 201 - 250: 5 units CBG 251 - 300: 8 units CBG 301 - 350: 11 units CBG 351 - 400: 15 units 01/05/16   Geradine Girt, DO  Insulin Glargine (LANTUS SOLOSTAR) 100 UNIT/ML Solostar Pen Inject 50 Units into the skin daily at 10 pm. 02/02/16   Annita Brod, MD  Insulin Pen Needle 31G X 5 MM MISC For insulin injections 01/05/16   Geradine Girt, DO  pantoprazole (PROTONIX) 40 MG tablet Take 1 tablet (40 mg total) by mouth at bedtime. 01/05/16   Geradine Girt, DO    Current Facility-Administered Medications  Medication Dose Route Frequency Provider Last Rate Last Dose  . 0.9 %  sodium chloride infusion   Intravenous Continuous Edwin Dada, MD 125 mL/hr at 02/07/16 0345    . acetaminophen (TYLENOL) tablet 650 mg  650 mg Oral Q6H PRN Edwin Dada, MD      . cefTRIAXone (ROCEPHIN) 2 g in dextrose 5 % 50 mL IVPB  2 g Intravenous Q24H Edwin Dada, MD      . guaiFENesin-codeine 100-10 MG/5ML solution 5 mL  5 mL Oral QHS PRN Edwin Dada, MD      . HYDROmorphone (DILAUDID) injection 1 mg  1 mg Intravenous Q4H PRN Edwin Dada, MD      . insulin aspart (novoLOG) injection 0-15 Units  0-15 Units Subcutaneous Q4H Edwin Dada, MD   0 Units at 02/07/16 0400  . ondansetron (ZOFRAN) tablet 4 mg  4 mg Oral Q6H PRN Edwin Dada, MD       Or  . ondansetron (ZOFRAN) injection 4 mg  4 mg Intravenous Q6H PRN Edwin Dada, MD      . sodium chloride flush (NS) 0.9 % injection 3 mL  3 mL Intravenous Q12H Edwin Dada, MD   3 mL at 02/07/16 0315    Allergies as of 02/06/2016  . (No Known Allergies)    Family History  Problem  Relation Age of Onset  . Heart attack Neg Hx   . Hypertension Father   . Stroke Neg Hx   . Schizophrenia Father   . Diabetes Mother     Social History   Social History  . Marital Status: Married    Spouse Name: N/A  . Number of Children: N/A  . Years of Education: N/A   Occupational History  .      Works in administration at [employer]   Social History Main Topics  . Smoking status: Never Smoker   . Smokeless tobacco: Never Used  . Alcohol Use: 0.0 oz/week    0 Standard drinks or equivalent per week     Comment: 1 beer per night previously - now rare use  . Drug Use:  No  . Sexual Activity: Not on file   Other Topics Concern  . Not on file   Social History Narrative    Review of Systems: No cough or shortness of breath, no chest pain, no skin rashes, no urinary difficulties, no neurologic symptoms, no enlarged lymph nodes, no lower GI symptoms. Has noted lower extremity swelling, as well as some tenderness to palpation of the abdomen but no abdominal pain since his recent hospital discharge.  Physical Exam: Vital signs in last 24 hours: Temp:  [98 F (36.7 C)-103 F (39.4 C)] 98 F (36.7 C) (07/09 0709) Pulse Rate:  [80-90] 82 (07/09 0709) Resp:  [19-32] 30 (07/09 0709) BP: (83-138)/(36-74) 121/69 mmHg (07/09 0709) SpO2:  [94 %-98 %] 98 % (07/09 0709) Weight:  [122.471 kg (270 lb)-130.5 kg (287 lb 11.2 oz)] 130.5 kg (287 lb 11.2 oz) (07/09 0334) Last BM Date: 02/06/16 General:   Alert,  Well-developed, well-nourished, pleasant and cooperative in NAD, appears pale  Head:  Normocephalic and atraumatic. Eyes:  Sclera clear, no icterus.   Conjunctiva pink. Mouth:   No ulcerations or lesions.  Oropharynx pink & moist. Neck:   No masses or thyromegaly. Lungs:  Clear throughout to auscultation.   No wheezes, crackles, or rhonchi. No evident respiratory distress. Heart:   Regular rate and rhythm; no murmurs, clicks, rubs,  or gallops. Abdomen: Mildly  adipose, mild upper abdominal tenderness and fullness, without discrete mass effect. No rigidity, guarding, rebound, or exquisite tenderness.   Msk:   Symmetrical without gross deformities. Pulses:  Normal radial pulse is noted. Extremities:  2+ lower extremity edema. Neurologic:  Alert and coherent;  grossly normal neurologically. Skin:  Intact without significant lesions or rashes, somewhat pale. Cervical Nodes:  No significant cervical adenopathy. Psych:   Alert and cooperative. Normal mood and affect.  Intake/Output from previous day: 07/08 0701 - 07/09 0700 In: 531.3 [I.V.:531.3] Out: 225 [Urine:225] Intake/Output this shift:    Lab Results:  Recent Labs  02/06/16 2050 02/07/16 0700  WBC 10.0 11.9*  HGB 8.3* 7.8*  HCT 25.8* 25.4*  PLT 422* 375   BMET  Recent Labs  02/06/16 2050  NA 135  K 4.1  CL 102  CO2 24  GLUCOSE 80  BUN 10  CREATININE 1.09  CALCIUM 8.4*   LFT  Recent Labs  02/06/16 2050  PROT 6.9  ALBUMIN 2.4*  AST 360*  ALT 127*  ALKPHOS 627*  BILITOT 3.5*   PT/INR  Recent Labs  02/07/16 0700  LABPROT 18.3*  INR 1.51*    Studies/Results: Dg Chest 2 View  02/06/2016  CLINICAL DATA:  Epigastric tenderness. History of necrotizing pancreatitis. Fever. EXAM: CHEST  2 VIEW COMPARISON:  Chest radiograph 01/30/2016, included lung bases from concurrent CT. FINDINGS: Lung volumes are low. Bibasilar and perihilar atelectasis with small left pleural effusion. Heart size is normal for technique. No pulmonary edema. No pneumothorax. IMPRESSION: Low lung volumes with perihilar and bibasilar atelectasis. Small left pleural effusion. Electronically Signed   By: Jeb Levering M.D.   On: 02/06/2016 23:37   Ct Abdomen Pelvis W Contrast  02/06/2016  CLINICAL DATA:  40 year old male with sudden onset of fever today approximately 7 p.m. Six days status post ERCP. EXAM: CT ABDOMEN AND PELVIS WITH CONTRAST TECHNIQUE: Multidetector CT imaging of the abdomen and  pelvis was performed using the standard protocol following bolus administration of intravenous contrast. CONTRAST:  160m ISOVUE-300 IOPAMIDOL (ISOVUE-300) INJECTION 61% COMPARISON:  Multiple priors, most recently CT of the  abdomen and pelvis 01/29/2016. FINDINGS: Lower chest: Areas of scarring and/or subsegmental atelectasis noted throughout the visualized lung bases. Trace bilateral pleural effusions lying dependently. Hepatobiliary: Moderate intrahepatic biliary ductal dilatation. No definite cystic or solid hepatic lesions. Gallbladder is moderately distended. Gallbladder wall appears mildly thickened up to 4 mm. There is some dependent material in the gallbladder which is intermediate attenuation, likely to represent noncalcified gallstones or biliary sludge balls. A biliary stent extends from the distal common bile duct into the third portion of the duodenum. Pancreas: The pancreatic parenchyma is largely replaced by a large low-attenuation fluid collection which currently measures up to 22.6 x 13.7 x 15.3 cm, compatible with a complex pancreatic pseudocyst. This is predominantly low-attenuation, but has some internal fatty components within it, presumably from saponification. Thick rim of enhancement surrounding the pseudocyst is noted. The borders of this pseudocyst are sometimes relatively well-defined, and at other times infiltrative, particularly with respect to the root of the small bowel mesentery into which the pseudocyst appears to have invaginated downward, with several finger-like projections adjacent to the branches of the superior mesenteric artery. This large pseudocyst again exerts mass effect upon adjacent structures, including common bile duct which is displaced laterally. Spleen: Spleen is enlarged measuring 16.6 x 7.7 x 12.9 cm (estimated splenic volume of 824 mL). Adrenals/Urinary Tract: Bilateral kidneys and bilateral adrenal glands are grossly normal in appearance. No  hydroureteronephrosis. Urinary bladder is normal in appearance. Stomach/Bowel: Normal appearance of the stomach. Duodenum is grossly distorted by mass effect from the large pancreatic pseudocyst such that the duodenal C-loop is draped over the portion of the pseudocyst in the region of the pancreatic head. No pathologic dilatation of small bowel or colon. The appendix is not confidently identified and may be surgically absent. Regardless, there are no inflammatory changes noted adjacent to the cecum to suggest the presence of an acute appendicitis at this time. Vascular/Lymphatic: No significant atherosclerotic disease, aneurysm or dissection identified in the abdominal or pelvic vasculature. Complete occlusion of the splenic volume again noted. At this time, the superior mesenteric vein, splenoportal confluence and portal vein appear grossly patent, although narrowed. Multiple borderline enlarged and mildly enlarged retroperitoneal and upper abdominal lymph nodes are noted, measuring up to 1.1 cm in short axis in the left para-aortic nodal station. Reproductive: Prostate gland and seminal vesicles are unremarkable in appearance. Other: Small volume of ascites predominantly in the pericolic gutters and low anatomic pelvis. No pneumoperitoneum. Musculoskeletal: Body wall edema. There are no aggressive appearing lytic or blastic lesions noted in the visualized portions of the skeleton. IMPRESSION: 1. Large complex pancreatic pseudocyst appears similar to the prior study, and is again compatible with necrotizing pancreatitis. This is again associated with thrombosis of the splenic vein. 2. Compared to the prior examination there has been interval placement of a biliary stent which extends from the common bile duct into the third portion of the duodenum. Despite the presence of this stent there continues to be moderate intrahepatic biliary ductal dilatation. 3. Noncalcified gallstones versus biliary sludge balls lying  dependently in the gallbladder. Gallbladder wall appears mildly thickened. This is favored to be reactive secondary to inflammation from necrotizing pancreatitis, rather than indicative of underlying cholecystitis, but clinical correlation is recommended. If there is any clinical concern for acute cholecystitis, further evaluation with right upper quadrant abdominal ultrasound could be performed. 4. Splenomegaly. 5. Trace bilateral pleural effusions. 6. Additional incidental findings, as above. Electronically Signed   By: Vinnie Langton M.D.   On:  02/06/2016 23:47    Impression: 1. Biliary obstruction due to extremely large pancreatic fluid collection/pseudocyst 2. Malfunction of biliary stent, probably due to migration, possibly due to occlusion 3. Recent severe necrotizing pancreatitis (May 2017), felt to be of biliary tract origin, with gallbladder sludge or stones noted on CT. 4. Splenic vein thrombosis with splenomegaly   Plan: 1. Continue antibiotics 2. We will try to get a repeat ERCP scheduled for tomorrow. Purpose and risks of the procedure discussed, patient agreeable. 3. Given the absence of fever since admission, and only modest elevation of white count this morning, I do not think emergent ERCP today is necessary. Unfortunately, chemistry results from this morning are not yet back, so it is not clear if his liver chemistries are trending upward or downward.   LOS: 0 days   Ulyses Panico V  02/07/2016, 8:17 AM   Pager [phone] If no answer or after 5 PM call [phone]

## 2016-02-07 NOTE — Progress Notes (Addendum)
Patient transferred from 5W with 2 Rns on monitor. Temp 99.2 oral and other VSS. CBG 95. Patient awake and alert. Clonidine patch to left arm found and removed, MD aware. Patient updated his wife about his new room location. Will continue to monitor.

## 2016-02-07 NOTE — Progress Notes (Signed)
Pharmacy Antibiotic Note  Timothy [name] is a 40 y.o. male admitted on 02/06/2016 with intra-abdominal infection.  Pharmacy has been consulted for imipenem dosing. No h/o seizures noted. WBC 11.9, Tmax 103, CrCl > 100  Noted ceftriaxone 2g given at 0035 this AM.  Plan: -D/c ceftriaxone -Imipenem 500 mg IV Q6H -Monitor clinical progress, c/s, renal function, abx plan/LOT  Height: 5\' 11"  (180.3 cm) Weight: 287 lb 11.2 oz (130.5 kg) IBW/kg (Calculated) : 75.3  Temp (24hrs), Avg:100.8 F (38.2 C), Min:98 F (36.7 C), Max:103 F (39.4 C)   Recent Labs Lab 02/01/16 0550 02/02/16 0528 02/06/16 2050 02/06/16 2112 02/07/16 0700  WBC 6.1 8.4 10.0  --  11.9*  CREATININE 1.24 1.10 1.09  --  1.20  LATICACIDVEN  --   --   --  1.47 1.1    Estimated Creatinine Clearance: 111.6 mL/min (by C-G formula based on Cr of 1.2).    No Known Allergies  Antimicrobials this admission: 7/9 ceftriaxone x1 7/9 imipenem>>  Microbiology results: 7/8 BCx: pending 7/9 MRSA PCR: neg  Thank you for allowing pharmacy to be a part of this patient's care.  Mackie Paienee Ackley, PharmD PGY1 Pharmacy Resident Pager: [phone] 02/07/2016 5:08 PM

## 2016-02-08 ENCOUNTER — Encounter (HOSPITAL_COMMUNITY)
Admission: EM | Disposition: A | Discharge status: Other Acute Inpatient Hospital | Payer: Self-pay | Source: Home / Self Care | Attending: Internal Medicine

## 2016-02-08 LAB — CBC WITH DIFFERENTIAL/PLATELET
Basophils Absolute: 0 10*3/uL (ref 0.0–0.1)
Basophils Relative: 0 %
Eosinophils Absolute: 0.2 10*3/uL (ref 0.0–0.7)
Eosinophils Relative: 2 %
HCT: 23.7 % — ABNORMAL LOW (ref 39.0–52.0)
Hemoglobin: 7.7 g/dL — ABNORMAL LOW (ref 13.0–17.0)
Lymphocytes Relative: 4 %
Lymphs Abs: 0.4 10*3/uL — ABNORMAL LOW (ref 0.7–4.0)
MCH: 28.8 pg (ref 26.0–34.0)
MCHC: 32.5 g/dL (ref 30.0–36.0)
MCV: 88.8 fL (ref 78.0–100.0)
Monocytes Absolute: 1 10*3/uL (ref 0.1–1.0)
Monocytes Relative: 10 %
Neutro Abs: 8.5 10*3/uL — ABNORMAL HIGH (ref 1.7–7.7)
Neutrophils Relative %: 84 %
Platelets: 408 10*3/uL — ABNORMAL HIGH (ref 150–400)
RBC: 2.67 MIL/uL — ABNORMAL LOW (ref 4.22–5.81)
RDW: 15.9 % — ABNORMAL HIGH (ref 11.5–15.5)
WBC: 10.1 10*3/uL (ref 4.0–10.5)

## 2016-02-08 LAB — COMPREHENSIVE METABOLIC PANEL
ALT: 157 U/L — ABNORMAL HIGH (ref 17–63)
AST: 217 U/L — ABNORMAL HIGH (ref 15–41)
Albumin: 1.9 g/dL — ABNORMAL LOW (ref 3.5–5.0)
Alkaline Phosphatase: 509 U/L — ABNORMAL HIGH (ref 38–126)
Anion gap: 6 (ref 5–15)
BUN: 7 mg/dL (ref 6–20)
CO2: 23 mmol/L (ref 22–32)
Calcium: 7.9 mg/dL — ABNORMAL LOW (ref 8.9–10.3)
Chloride: 104 mmol/L (ref 101–111)
Creatinine, Ser: 0.93 mg/dL (ref 0.61–1.24)
GFR calc Af Amer: >60 mL/min (ref >60)
GFR calc non Af Amer: >60 mL/min (ref >60)
Glucose, Bld: 195 mg/dL — ABNORMAL HIGH (ref 65–99)
Potassium: 4.2 mmol/L (ref 3.5–5.1)
Sodium: 133 mmol/L — ABNORMAL LOW (ref 135–145)
Total Bilirubin: 4.9 mg/dL — ABNORMAL HIGH (ref 0.3–1.2)
Total Protein: 5.9 g/dL — ABNORMAL LOW (ref 6.5–8.1)

## 2016-02-08 LAB — TYPE AND SCREEN
ABO/RH(D): AB POS
Antibody Screen: NEGATIVE

## 2016-02-08 LAB — GLUCOSE, CAPILLARY
Glucose-Capillary: 207 mg/dL — ABNORMAL HIGH (ref 65–99)
Glucose-Capillary: 154 mg/dL — ABNORMAL HIGH (ref 65–99)
Glucose-Capillary: 217 mg/dL — ABNORMAL HIGH (ref 65–99)
Glucose-Capillary: 169 mg/dL — ABNORMAL HIGH (ref 65–99)
Glucose-Capillary: 174 mg/dL — ABNORMAL HIGH (ref 65–99)

## 2016-02-08 SURGERY — ENDOSCOPIC RETROGRADE CHOLANGIOPANCREATOGRAPHY (ERCP) WITH PROPOFOL
Anesthesia: General

## 2016-02-08 MED ORDER — INSULIN ASPART 100 UNIT/ML ~~LOC~~ SOLN: 0.0000 [IU] | Freq: Every day | SUBCUTANEOUS | Status: DC

## 2016-02-08 MED ORDER — INSULIN GLARGINE 100 UNIT/ML ~~LOC~~ SOLN
25.0000 [IU] | Freq: Every day | SUBCUTANEOUS | Status: DC
  Administered 2016-02-08: 25 [IU] via SUBCUTANEOUS
  Filled 2016-02-08 (×2): qty 0.25

## 2016-02-08 MED ORDER — INSULIN ASPART 100 UNIT/ML ~~LOC~~ SOLN
0.0000 [IU] | Freq: Three times a day (TID) | SUBCUTANEOUS | Status: DC
  Administered 2016-02-08: 5 [IU] via SUBCUTANEOUS
  Administered 2016-02-08 – 2016-02-09 (×2): 3 [IU] via SUBCUTANEOUS
  Administered 2016-02-09: 5 [IU] via SUBCUTANEOUS

## 2016-02-08 NOTE — Progress Notes (Signed)
Report called to RN, Shinita for 5 west room 29.

## 2016-02-08 NOTE — Discharge Summary (Addendum)
TRANSFER/DISCHARGE SUMMARY   Timothy [name] [id number]  is a 40 y.o. male  Outpatient Primary MD for the patient is Timothy Heron, MD Admission date: 02/06/2016 Transfer Date 02/08/2016 Admitting Physician Timothy Sam, MD   Place of Transfer [facility] Accepting MD : Timothy [name] Mode : carelink Condition:  stable    Diagnosis    SIRS in context of necrotizing pancreatitis, cholangitis/biliary obstruction Pancreatic pseudocyst [K86.3] Pancreatitis induced diabetes Hypotension with underlying history of hypertension Normocytic anemia  Transamnitis      Past Medical History  Diagnosis Date  . Essential hypertension     started on lisinopril about 45 days prior to pancreatitis  . Pancreatitis, acute 12/22/2015    a. 11/2015 - acute severe pancreatitis with shock/sepsis, complicated by acute respiratory failure, transaminitis, anemia of critical illness, acute encephalopathy, AKI (Cr 1.36), protein-calorie malnutrition with severe hypoalbuminemia, & hypokalemia.  Marland Kitchen AKI (acute kidney injury) (HCC) 12/22/2015  . Anxiety   . Diabetes mellitus (HCC)   . Obesity     Past Surgical History  Procedure Laterality Date  . Back surgery    . Ercp N/A 01/31/2016    Procedure: ENDOSCOPIC RETROGRADE CHOLANGIOPANCREATOGRAPHY (ERCP);  Surgeon: Timothy Hawking, MD;  Location: [facility] ENDOSCOPY;  Service: Endoscopy;  Laterality: N/A;    ConsultsGastroenterology   Hospital Course See H&P for all details in brief, Patient is a 40 y.o. male with history significant for recent necrotizing pancreatitis, however secondary IDDM, recent biliary stent, and pancreatic pseudocyst who presented with acute fever/chills. He had no significant medical problems until May of this year when he developed sudden onset severe epigastric pain and was admitted with pancreatitis on 5/22. This hospitalization was complicated by ARDS requiring intubation, accelerated HTN, new onset IDDM, and  anemia. Suspected gallstone pancreatitis. Readmitted 2 weeks ago with jaundice and TBili >9 and was found to have a new large peripancreatic fluid collection 24 x 14 x 10 cm causing mass effect on the biliary tree. This was treated with antibiotics and then ERCP with biliary stent placed on 01/31/16 by Timothy. Elnoria [name] and discharged home again on 7/4. On the night of admission, patient started having fevers and chills, epigastric and right upper quadrant abdominal pain, weakness. No nausea or vomiting. In ED patient was noted to be febrile 103F, creatinine 1.1, hemoglobin 8.3, baseline around 8. AST ALT and total bilirubin elevated. Lipase normal. CT abdomen and pelvis with contrast showed stable 22 x 13 x 15 complex pseudocyst, upstream biliary ductal dilatation, and known splenic vein thrombosis with splenomegaly. GI was consulted and patient was admitted for further workup.  SIRS in context of necrotizing pancreatitis: Concern forcholangitis versus infected pseudocyst with biliary obstruction, elevated transaminitis and hyperbilirubinemia - Currently pain controlled, patient was placed on gentle hydration and IV antibiotics. - GI was consulted, possible malfunction of biliary stent due to migration or occlusion. Per gastroenterology, patient needs management at tertiary care where advanced biliary endoscopy is possible as patient may need more than simply stent placement and possible pseudocyst drainage as well. - Blood cultures negative so far, continue IV imipenem, LFTs are improving - Discussed with gastroenterology, Timothy. Dulce Sellar who have spoken with gastroenterology at [facility].  Pancreatic pseudocyst - CT abdomen and pelvis showed stable large complex pancreatic pseudocyst, will defer to GI at [facility] if pseudocyst needs aspiration  Pancreatitis induced diabetes:  - Restart Lantus at half dose, currently at 25units at bed time, (home dose 50units), continue sliding scale insulin  Hypotension with  underlying history of Hypertension:  -  BP currently stable, continue to hold carvedilol and clonidine patch   Normocytic anemia:  Stable -Trend CBC  Cough: -Continue cough syrup PRN  Today   Subjective:   Timothy [name] today has no headache,no chest or abdominal pain,no new weakness tingling or numbness. Low-grade fever overnight otherwise stable.  Objective:   Blood pressure 144/87, pulse 88, temperature 99.1 F (37.3 C), temperature source Oral, resp. rate 14, height  (1.803 m), weight 132.6 kg (292 lb 5.3 oz), SpO2 99 %.  Intake/Output Summary (Last 24 hours) at 02/08/16 1130 Last data filed at 02/08/16 0900  Gross per 24 hour  Intake   2865 ml  Output      1 ml  Net   2864 ml    Exam Gen: Awake Alert, Oriented x3, No new F.N deficits, Normal affect HEENT:EOMI, PERLA Neck: Supple,no JVD, No cervical lymphadenopathy appreciated.  Chest: clear to ausculatation, no wheezing, rales and rhonchi CVS: RRR,No murmurs, rubs or gallops Abdomen: Normal bowel sounds, Soft, Non tender, No organomegaly, No rebound or guarding Extremeties: No Cyanosis, Clubbing or edema, No new Rash or bruise  Data Review  CBC w Diff: Lab Results  Component Value Date   WBC 10.1 02/08/2016   HGB 7.7* 02/08/2016   HCT 23.7* 02/08/2016   PLT 408* 02/08/2016   LYMPHOPCT 4 02/08/2016   MONOPCT 10 02/08/2016   EOSPCT 2 02/08/2016   BASOPCT 0 02/08/2016   CMP: Lab Results  Component Value Date   NA 133* 02/08/2016   K 4.2 02/08/2016   CL 104 02/08/2016   CO2 23 02/08/2016   BUN 7 02/08/2016   CREATININE 0.93 02/08/2016   PROT 5.9* 02/08/2016   ALBUMIN 1.9* 02/08/2016   BILITOT 4.9* 02/08/2016   ALKPHOS 509* 02/08/2016   AST 217* 02/08/2016   ALT 157* 02/08/2016  .  Significant Tests   Significant Diagnostic Studies:  Dg Chest 2 View  02/06/2016  CLINICAL DATA:  Epigastric tenderness. History of necrotizing pancreatitis. Fever. EXAM: CHEST  2 VIEW COMPARISON:  Chest  radiograph 01/30/2016, included lung bases from concurrent CT. FINDINGS: Lung volumes are low. Bibasilar and perihilar atelectasis with small left pleural effusion. Heart size is normal for technique. No pulmonary edema. No pneumothorax. IMPRESSION: Low lung volumes with perihilar and bibasilar atelectasis. Small left pleural effusion. Electronically Signed   By: Rubye Oaks M.D.   On: 02/06/2016 23:37   Ct Abdomen Pelvis W Contrast  02/06/2016  CLINICAL DATA:  40 year old male with sudden onset of fever today approximately 7 p.m. Six days status post ERCP. EXAM: CT ABDOMEN AND PELVIS WITH CONTRAST TECHNIQUE: Multidetector CT imaging of the abdomen and pelvis was performed using the standard protocol following bolus administration of intravenous contrast. CONTRAST:  ISOVUE-300 IOPAMIDOL (ISOVUE-300) INJECTION 61% COMPARISON:  Multiple priors, most recently CT of the abdomen and pelvis 01/29/2016. FINDINGS: Lower chest: Areas of scarring and/or subsegmental atelectasis noted throughout the visualized lung bases. Trace bilateral pleural effusions lying dependently. Hepatobiliary: Moderate intrahepatic biliary ductal dilatation. No definite cystic or solid hepatic lesions. Gallbladder is moderately distended. Gallbladder wall appears mildly thickened up to 4 mm. There is some dependent material in the gallbladder which is intermediate attenuation, likely to represent noncalcified gallstones or biliary sludge balls. A biliary stent extends from the distal common bile duct into the third portion of the duodenum. Pancreas: The pancreatic parenchyma is largely replaced by a large low-attenuation fluid collection which currently measures up to 22.6 x 13.7 x 15.3 cm, compatible with a  complex pancreatic pseudocyst. This is predominantly low-attenuation, but has some internal fatty components within it, presumably from saponification. Thick rim of enhancement surrounding the pseudocyst is noted. The borders of  this pseudocyst are sometimes relatively well-defined, and at other times infiltrative, particularly with respect to the root of the small bowel mesentery into which the pseudocyst appears to have invaginated downward, with several finger-like projections adjacent to the branches of the superior mesenteric artery. This large pseudocyst again exerts mass effect upon adjacent structures, including common bile duct which is displaced laterally. Spleen: Spleen is enlarged measuring 16.6 x 7.7 x 12.9 cm (estimated splenic volume of 824 mL). Adrenals/Urinary Tract: Bilateral kidneys and bilateral adrenal glands are grossly normal in appearance. No hydroureteronephrosis. Urinary bladder is normal in appearance. Stomach/Bowel: Normal appearance of the stomach. Duodenum is grossly distorted by mass effect from the large pancreatic pseudocyst such that the duodenal C-loop is draped over the portion of the pseudocyst in the region of the pancreatic head. No pathologic dilatation of small bowel or colon. The appendix is not confidently identified and may be surgically absent. Regardless, there are no inflammatory changes noted adjacent to the cecum to suggest the presence of an acute appendicitis at this time. Vascular/Lymphatic: No significant atherosclerotic disease, aneurysm or dissection identified in the abdominal or pelvic vasculature. Complete occlusion of the splenic volume again noted. At this time, the superior mesenteric vein, splenoportal confluence and portal vein appear grossly patent, although narrowed. Multiple borderline enlarged and mildly enlarged retroperitoneal and upper abdominal lymph nodes are noted, measuring up to 1.1 cm in short axis in the left para-aortic nodal station. Reproductive: Prostate gland and seminal vesicles are unremarkable in appearance. Other: Small volume of ascites predominantly in the pericolic gutters and low anatomic pelvis. No pneumoperitoneum. Musculoskeletal: Body wall edema.  There are no aggressive appearing lytic or blastic lesions noted in the visualized portions of the skeleton. IMPRESSION: 1. Large complex pancreatic pseudocyst appears similar to the prior study, and is again compatible with necrotizing pancreatitis. This is again associated with thrombosis of the splenic vein. 2. Compared to the prior examination there has been interval placement of a biliary stent which extends from the common bile duct into the third portion of the duodenum. Despite the presence of this stent there continues to be moderate intrahepatic biliary ductal dilatation. 3. Noncalcified gallstones versus biliary sludge balls lying dependently in the gallbladder. Gallbladder wall appears mildly thickened. This is favored to be reactive secondary to inflammation from necrotizing pancreatitis, rather than indicative of underlying cholecystitis, but clinical correlation is recommended. If there is any clinical concern for acute cholecystitis, further evaluation with right upper quadrant abdominal ultrasound could be performed. 4. Splenomegaly. 5. Trace bilateral pleural effusions. 6. Additional incidental findings, as above. Electronically Signed   By: Trudie Reedaniel  Entrikin M.D.   On: 02/06/2016 23:47    2D ECHO:    Scheduled Meds: . imipenem-cilastatin  500 mg Intravenous Q6H  . insulin aspart  0-15 Units Subcutaneous TID WC  . insulin aspart  0-5 Units Subcutaneous QHS  . insulin glargine  25 Units Subcutaneous QHS  . sodium chloride flush  3 mL Intravenous Q12H   Continuous Infusions: . sodium chloride 75 mL/hr (02/08/16 1019)       The risks and  Benefits of transporting including disability, death were discussed with the patient or health power of attorney and were agreeable to the plan and further management.  Total Time in preparing paper work, todays exam and data evaluation: 35  minutes  Signed: RAI,RIPUDEEP M.D. [facility] 02/08/2016, 11:30 AM

## 2016-02-08 NOTE — Progress Notes (Signed)
Patient ID: Timothy [name], male   DOB: 05-23-1976, 40 y.o.   MRN: [id number] Requested by patient and family to review images and labs.  [facility] pseud[facility].  Reviewed with family possibility of alternative nutrition needs.

## 2016-02-08 NOTE — Progress Notes (Signed)
Subjective: No abdominal pain.  Objective: Vital signs in last 24 hours: Temp:  [98.6 F (37 C)-100.5 F (38.1 C)] 99.2 F (37.3 C) (07/10 1128) Pulse Rate:  [88-93] 88 (07/10 0700) Resp:  [14-35] 14 (07/10 0700) BP: (135-144)/(80-95) 143/90 mmHg (07/10 1128) SpO2:  [96 %-99 %] 99 % (07/10 0700) Weight:  [132.6 kg (292 lb 5.3 oz)] 132.6 kg (292 lb 5.3 oz) (07/10 0500) Weight change: 10.129 kg (22 lb 5.3 oz) Last BM Date: 02/07/16  PE: GEN:  Deconditioned-appearing, NAD HEENT:  Sclera icteric SKIN:  Jaundiced ABD:  Protuberant, mild epigastric tenderness  Lab Results: CBC    Component Value Date/Time   WBC 10.1 02/08/2016 0525   RBC 2.67* 02/08/2016 0525   HGB 7.7* 02/08/2016 0525   HCT 23.7* 02/08/2016 0525   PLT 408* 02/08/2016 0525   MCV 88.8 02/08/2016 0525   MCH 28.8 02/08/2016 0525   MCHC 32.5 02/08/2016 0525   RDW 15.9* 02/08/2016 0525   LYMPHSABS 0.4* 02/08/2016 0525   MONOABS 1.0 02/08/2016 0525   EOSABS 0.2 02/08/2016 0525   BASOSABS 0.0 02/08/2016 0525   CMP     Component Value Date/Time   NA 133* 02/08/2016 0525   K 4.2 02/08/2016 0525   CL 104 02/08/2016 0525   CO2 23 02/08/2016 0525   GLUCOSE 195* 02/08/2016 0525   BUN 7 02/08/2016 0525   CREATININE 0.93 02/08/2016 0525   CALCIUM 7.9* 02/08/2016 0525   PROT 5.9* 02/08/2016 0525   ALBUMIN 1.9* 02/08/2016 0525   AST 217* 02/08/2016 0525   ALT 157* 02/08/2016 0525   ALKPHOS 509* 02/08/2016 0525   BILITOT 4.9* 02/08/2016 0525   GFRNONAA >60 02/08/2016 0525   GFRAA >60 02/08/2016 0525   Assessment:  1.  Necrotizing gallstone pancreatitis, complicated by duodenal distortion and biliary obstruction. 2.  Elevated LFTs, from bile duct compression from large pancreatic fluid collection. 3.  Anemia without overt GI bleeding.  Likely dilutional and from acute illness.  Plan:  1.  I think endoscopic cystgastrostomy is something that should be considered, which is not offered here.  Endoscopic cyst  drainage will facilitate resolution of the patient's biliary obstruction and might help deflect interval development of gastric outlet obstruction (symptoms of which the patient does not presently have). 2.  Continue antibiotics. 3.  [facility] #1 above.  I have personally spoken with both the Hospitalist and GI physician (Dr. Boyd KerbsPawa) [facility] who have kindly accepted patient in transfer.  I have likewise directly spoken with the [facility] physician, Dr. Isidoro Donningai, about Mr. Sharlyne PacasHoldren's case.  Both medical staff, patient and patient's wife are in agreement with today's current plan and direction of management.   Freddy JakschOUTLAW,Donnie Panik M 02/08/2016, 1:20 PM   Pager [phone] If no answer or after 5 PM call [phone]

## 2016-02-08 NOTE — Progress Notes (Signed)
[facility]                                                                              Patient Demographics  Timothy [name], is a 40 y.o. male, DOB - Nov 14, 1975, ZOX:[id number]  Admit date - 02/06/2016   Admitting Physician Alberteen Sam, MD  Outpatient Primary MD for the patient is Clayborn Heron, MD  Outpatient specialists:   LOS - 1  days    Chief Complaint  Patient presents with  . Fever  . Post-op Problem       Brief summary   Patient is a 40 y.o. male with history significant for recent necrotizing pancreatitis, however secondary IDDM, recent biliary stent, and pancreatic pseudocyst who presented with acute fever/chills. He had no significant medical problems until May of this year when he developed sudden onset severe epigastric pain and was admitted with pancreatitis on 5/22. This hospitalization was complicated by ARDS requiring intubation, accelerated HTN, new onset IDDM, and anemia. Suspected gallstone pancreatitis. Readmitted 2 weeks ago with jaundice and TBili >9 and was found to have a new large peripancreatic fluid collection 24 x 14 x 10 cm causing mass effect on the biliary tree. This was treated with antibiotics and then ERCP with biliary stent placed on 01/31/16 by Dr. Elnoria Howard and discharged home again on 7/4. On the night of admission, patient started having fevers and chills, epigastric and right upper quadrant abdominal pain, weakness. No nausea or vomiting. In ED patient was noted to be febrile 103F, creatinine 1.1, hemoglobin 8.3, baseline around 8. AST ALT and total bilirubin elevated. Lipase normal. CT abdomen and pelvis with contrast showed stable 22 x 13 x 15 complex pseudocyst, upstream biliary ductal dilatation, and known splenic vein thrombosis with splenomegaly. GI was consulted and patient was admitted for further workup.   Assessment & Plan    Principal Problem: SIRS in context of necrotizing pancreatitis: Concern  forcholangitis versus infected pseudocyst with biliary obstruction, elevated transaminitis and hyperbilirubinemia - Currently pain controlled, continue gentle hydration, GI was consulted, possible malfunction of biliary stent due to migration or occlusion.  - Blood cultures negative so far, continue IV imipenem), LFTs are improving - Discussed with gastroenterology, Dr. Dulce Sellar today who have spoken with gastroenterology at [facility], patient is on the waiting list to be transferred when bed is available.    Pancreatic pseudocyst - CT abdomen and pelvis showed stable large complex pancreatic pseudocyst, will defer to GI at [facility] if pseudocyst needs aspiration  Pancreatitis induced diabetes:  - Restart Lantus at half dose, sliding scale insulin  Hypotension with underlying history of Hypertension:  -BP currently stable, continue to hold carvedilol and clonidine patch    Normocytic anemia:  Stable -Trend CBC   Cough: This has been troublesome for patient, per notes. -Continue cough syrup PRN  Code Status:Full CODE STATUS  DVT Prophylaxis:  SCD's Family Communication: Discussed in detail with the patient, all imaging results, lab results explained to the patient  Disposition Plan: Continue in SDU, potential transfer to [facility] once bed available  Time Spent in minutes   25 minutes  Procedures:  CT abd  Consultants:   GI  Antimicrobials:   IV Rocephin   Medications  Scheduled Meds: . imipenem-cilastatin  500 mg Intravenous Q6H  . insulin aspart  0-15 Units Subcutaneous TID WC  . insulin aspart  0-5 Units Subcutaneous QHS  . insulin glargine  25 Units Subcutaneous QHS  . sodium chloride flush  3 mL Intravenous Q12H   Continuous Infusions: . sodium chloride 75 mL/hr (02/08/16 1019)   PRN Meds:.acetaminophen, guaiFENesin-codeine, HYDROmorphone (DILAUDID) injection, ondansetron **OR** ondansetron (ZOFRAN) IV   Antibiotics   Anti-infectives    Start      Dose/Rate Route Frequency Ordered Stop   02/07/16 2000  cefTRIAXone (ROCEPHIN) 2 g in dextrose 5 % 50 mL IVPB  Status:  Discontinued     2 g 100 mL/hr over 30 Minutes Intravenous Every 24 hours 02/07/16 0311 02/07/16 1717   02/07/16 1800  imipenem-cilastatin (PRIMAXIN) 500 mg in sodium chloride 0.9 % 100 mL IVPB     500 mg 200 mL/hr over 30 Minutes Intravenous Every 6 hours 02/07/16 1717     02/07/16 0030  cefTRIAXone (ROCEPHIN) 2 g in dextrose 5 % 50 mL IVPB     2 g 100 mL/hr over 30 Minutes Intravenous  Once 02/07/16 0029 02/07/16 0119        Subjective:   Timothy [name] was seen and examined today. Low-grade temp 99.1 apparently today, otherwise no nausea, vomiting any abdominal pain.  Patient denies dizziness, chest pain, shortness of breath,  new weakness, numbess, tingling. No acute events overnight.    Objective:   Filed Vitals:   02/07/16 1946 02/07/16 2310 02/08/16 0443 02/08/16 0500  BP: 143/80 135/95 138/94   Pulse: 93 90 91   Temp: 99.9 F (37.7 C) 98.6 F (37 C) 99.1 F (37.3 C)   TempSrc: Oral Oral Oral   Resp: 27 30 35   Height:      Weight:    132.6 kg (292 lb 5.3 oz)  SpO2: 98% 97% 96%     Intake/Output Summary (Last 24 hours) at 02/08/16 1124 Last data filed at 02/08/16 0900  Gross per 24 hour  Intake   2865 ml  Output      1 ml  Net   2864 ml     Wt Readings from Last 3 Encounters:  02/08/16 132.6 kg (292 lb 5.3 oz)  01/29/16 119.5 kg (263 lb 7.2 oz)  01/21/16 125.193 kg (276 lb)     Exam  General: Alert and oriented x 3, NAD  HEENT:    Neck: Supple, no JVD  Cardiovascular: S1 S2 clear, RRR  Respiratory: CTAB  Gastrointestinal: Obese, Soft, Nontender, ND, + bowel sounds. No rigidity or guarding.   Ext: no cyanosis clubbing or edema  Neuro: no new deficits  Skin: No rashes  Psych: Normal affect and demeanor, alert and oriented x3    Data Reviewed:  I have personally reviewed following labs and imaging studies  Micro  Results Recent Results (from the past 240 hour(s))  Culture, blood (Routine X 2) w Reflex to ID Panel     Status: None (Preliminary result)   Collection Time: 02/06/16  8:50 PM  Result Value Ref Range Status   Specimen Description BLOOD RIGHT ANTECUBITAL  Final   Special Requests BOTTLES DRAWN AEROBIC AND ANAEROBIC EACH  Final   Culture PENDING  Incomplete   Report Status PENDING  Incomplete  MRSA PCR Screening     Status: None   Collection Time: 02/07/16  3:35 AM  Result Value Ref Range Status   MRSA by PCR NEGATIVE NEGATIVE Final    Comment:        The GeneXpert MRSA Assay (FDA approved for NASAL specimens only), is one component of a comprehensive MRSA colonization surveillance program. It is not intended to diagnose MRSA infection nor to guide or monitor treatment for MRSA infections.     Radiology Reports Dg Chest 2 View  02/06/2016  CLINICAL DATA:  Epigastric tenderness. History of necrotizing pancreatitis. Fever. EXAM: CHEST  2 VIEW COMPARISON:  Chest radiograph 01/30/2016, included lung bases from concurrent CT. FINDINGS: Lung volumes are low. Bibasilar and perihilar atelectasis with small left pleural effusion. Heart size is normal for technique. No pulmonary edema. No pneumothorax. IMPRESSION: Low lung volumes with perihilar and bibasilar atelectasis. Small left pleural effusion. Electronically Signed   By: Rubye Oaks M.D.   On: 02/06/2016 23:37   Dg Chest 2 View  01/30/2016  CLINICAL DATA:  Cough EXAM: CHEST  2 VIEW COMPARISON:  01/13/2016 FINDINGS: Heart size and vascularity within normal limits. Mild atelectasis or scarring in the lung bases. Right perihilar atelectasis has resolved. 1 cm nodular density below the left hilum is similar to the prior study and may be due to atelectasis overlying blood vessels. No pleural effusion. Negative for edema. IMPRESSION: Bibasilar atelectasis/ infiltrate. Left lower hilar density likely due to airspace disease. No mass lesion  in this area on recent chest CT 12/21/2015 Electronically Signed   By: Marlan Palau M.D.   On: 01/30/2016 16:13   Dg Chest 2 View  01/13/2016  CLINICAL DATA:  The pt is c/o abd an elevated temp today with a cough. He was recently admitted for pancreatitis from gallstones. His abd feel bloated No pain EXAM: CHEST - 2 VIEW COMPARISON:  12/29/2015 FINDINGS: Patient has been extubated, the central line and nasogastric tube removed. Linear right perihilar scarring or atelectasis persists. Left lung remains clear.  Heart size normal.  No pneumothorax. No effusion. Visualized bones unremarkable. IMPRESSION: 1. Persistent linear scarring or atelectasis at the right hilum. No acute findings. Electronically Signed   By: Corlis Leak M.D.   On: 01/13/2016 23:00   Ct Abdomen Pelvis W Contrast  02/06/2016  CLINICAL DATA:  40 year old male with sudden onset of fever today approximately 7 p.m. Six days status post ERCP. EXAM: CT ABDOMEN AND PELVIS WITH CONTRAST TECHNIQUE: Multidetector CT imaging of the abdomen and pelvis was performed using the standard protocol following bolus administration of intravenous contrast. CONTRAST:  ISOVUE-300 IOPAMIDOL (ISOVUE-300) INJECTION 61% COMPARISON:  Multiple priors, most recently CT of the abdomen and pelvis 01/29/2016. FINDINGS: Lower chest: Areas of scarring and/or subsegmental atelectasis noted throughout the visualized lung bases. Trace bilateral pleural effusions lying dependently. Hepatobiliary: Moderate intrahepatic biliary ductal dilatation. No definite cystic or solid hepatic lesions. Gallbladder is moderately distended. Gallbladder wall appears mildly thickened up to 4 mm. There is some dependent material in the gallbladder which is intermediate attenuation, likely to represent noncalcified gallstones or biliary sludge balls. A biliary stent extends from the distal common bile duct into the third portion of the duodenum. Pancreas: The pancreatic parenchyma is largely  replaced by a large low-attenuation fluid collection which currently measures up to 22.6 x 13.7 x 15.3 cm, compatible with a complex pancreatic pseudocyst. This is predominantly low-attenuation, but has some internal fatty components within it, presumably from saponification. Thick rim of enhancement surrounding the pseudocyst is noted. The borders of this pseudocyst are sometimes relatively well-defined, and at  other times infiltrative, particularly with respect to the root of the small bowel mesentery into which the pseudocyst appears to have invaginated downward, with several finger-like projections adjacent to the branches of the superior mesenteric artery. This large pseudocyst again exerts mass effect upon adjacent structures, including common bile duct which is displaced laterally. Spleen: Spleen is enlarged measuring 16.6 x 7.7 x 12.9 cm (estimated splenic volume of 824 mL). Adrenals/Urinary Tract: Bilateral kidneys and bilateral adrenal glands are grossly normal in appearance. No hydroureteronephrosis. Urinary bladder is normal in appearance. Stomach/Bowel: Normal appearance of the stomach. Duodenum is grossly distorted by mass effect from the large pancreatic pseudocyst such that the duodenal C-loop is draped over the portion of the pseudocyst in the region of the pancreatic head. No pathologic dilatation of small bowel or colon. The appendix is not confidently identified and may be surgically absent. Regardless, there are no inflammatory changes noted adjacent to the cecum to suggest the presence of an acute appendicitis at this time. Vascular/Lymphatic: No significant atherosclerotic disease, aneurysm or dissection identified in the abdominal or pelvic vasculature. Complete occlusion of the splenic volume again noted. At this time, the superior mesenteric vein, splenoportal confluence and portal vein appear grossly patent, although narrowed. Multiple borderline enlarged and mildly enlarged  retroperitoneal and upper abdominal lymph nodes are noted, measuring up to 1.1 cm in short axis in the left para-aortic nodal station. Reproductive: Prostate gland and seminal vesicles are unremarkable in appearance. Other: Small volume of ascites predominantly in the pericolic gutters and low anatomic pelvis. No pneumoperitoneum. Musculoskeletal: Body wall edema. There are no aggressive appearing lytic or blastic lesions noted in the visualized portions of the skeleton. IMPRESSION: 1. Large complex pancreatic pseudocyst appears similar to the prior study, and is again compatible with necrotizing pancreatitis. This is again associated with thrombosis of the splenic vein. 2. Compared to the prior examination there has been interval placement of a biliary stent which extends from the common bile duct into the third portion of the duodenum. Despite the presence of this stent there continues to be moderate intrahepatic biliary ductal dilatation. 3. Noncalcified gallstones versus biliary sludge balls lying dependently in the gallbladder. Gallbladder wall appears mildly thickened. This is favored to be reactive secondary to inflammation from necrotizing pancreatitis, rather than indicative of underlying cholecystitis, but clinical correlation is recommended. If there is any clinical concern for acute cholecystitis, further evaluation with right upper quadrant abdominal ultrasound could be performed. 4. Splenomegaly. 5. Trace bilateral pleural effusions. 6. Additional incidental findings, as above. Electronically Signed   By: Trudie Reed M.D.   On: 02/06/2016 23:47   Ct Abdomen Pelvis W Contrast  01/29/2016  CLINICAL DATA:  Follow-up pancreatitis. EXAM: CT ABDOMEN AND PELVIS WITH CONTRAST TECHNIQUE: Multidetector CT imaging of the abdomen and pelvis was performed using the standard protocol following bolus administration of intravenous contrast. CONTRAST:  ISOVUE-300 IOPAMIDOL (ISOVUE-300) INJECTION 61%  COMPARISON:  CT scans since Dec 21, 2015 with the most recent comparison from January 02, 2016 FINDINGS: Mild atelectasis in the left lung base. A small calcified granuloma is seen in the right lung base. Lung bases are otherwise unchanged and unremarkable. No free air. Necrotizing pancreatitis is again identified. The pancreatic body and tail are no longer visualized due to necrosis. There is a large peripancreatic fluid collection measuring 24 x 14 cm on series 2, image 41. A few adjacent smaller collections are also seen such as anterior to the SMV on image 51. There is extensive fat  stranding in the surrounding tissues. The peripancreatic fluid collection exerts mass effect on the distal common bile duct resulting in intra and extrahepatic biliary duct dilatation which is a new finding. No focal liver masses are identified. The hepatic veins are patent. There is narrowing of the portal vein as it passes by the peripancreatic collection. This narrowing is best seen on coronal images 49 through 55. No definitive portal vein thrombosis is identified at this time. The SMV remains patent. The splenic vein is no longer visualized. The gallbladder is markedly distended which correlates with the central common bile duct. The spleen is slightly more prominent in the interval but otherwise normal. Some collateral splenic vessels are identified near the splenic hilum. The adrenal glands and kidneys are normal. The abdominal aorta is normal in caliber with no atherosclerosis. No adenopathy identified. The peripancreatic fluid collection exerts mass effect on the second portion the duodenum without gastric distention. The small bowel is otherwise normal. The colon is normal. No secondary evidence of appendicitis. No adenopathy or mass in the pelvis. The bladder is normal. The prostate and seminal vesicles are unremarkable. Delayed images demonstrate no filling defects in the upper renal collecting systems. Visualized bones are  unremarkable other than mild degenerative changes in the spine. IMPRESSION: 1. Necrotizing pancreatitis with interval development of a large 24 x 14 cm peripancreatic fluid collection. The fluid collection exerts mass effect on the central common bile duct resulting in intra and extrahepatic biliary duct dilatation and gallbladder distention, both of which are new findings. No blood flow is seen in the splenic vein. The peripancreatic collection exerts mass effect on the portal vein as it passes the collection with significant narrowing but no definitive thrombosis. These results will be called to the ordering clinician or representative by the Radiologist Assistant, and communication documented in the PACS or zVision Dashboard. Electronically Signed   By: Gerome Samavid  Williams III M.D   On: 01/29/2016 13:44   Dg Ercp  01/31/2016  CLINICAL DATA:  Biliary obstruction EXAM: ERCP TECHNIQUE: Multiple spot images obtained with the fluoroscopic device and submitted for interpretation post-procedure. FLUOROSCOPY TIME:  3 minutes 29 seconds COMPARISON:  CT 01/29/2016 FINDINGS: Two spot images document endoscopy. A plastic stent extends from the proximal CBD across the expected level of the ampulla. There is opacification of the proximal CBD, which is not visualized in the mid and distal segments. Intrahepatic ducts are incompletely opacified and visualized. IMPRESSION: Plastic biliary stent placement across the distal CBD. These images were submitted for radiologic interpretation only. Please see the procedural report for the amount of contrast and the fluoroscopy time utilized. Electronically Signed   By: Corlis Leak  Hassell M.D.   On: 01/31/2016 09:13    Lab Data:  CBC:  Recent Labs Lab 02/02/16 0528 02/06/16 2050 02/07/16 0700 02/08/16 0525  WBC 8.4 10.0 11.9* 10.1  NEUTROABS 6.5 8.4*  --  8.5*  HGB 8.2* 8.3* 7.8* 7.7*  HCT 26.2* 25.8* 25.4* 23.7*  MCV 92.3 90.5 91.4 88.8  PLT 406* 422* 375 408*   Basic Metabolic  Panel:  Recent Labs Lab 02/02/16 0528 02/06/16 2050 02/07/16 0700 02/08/16 0525  NA 134* 135 136 133*  K 4.3 4.1 4.1 4.2  CL 102 102 104 104  CO2 25 24 24 23   GLUCOSE 67 80 110* 195*  BUN 6 10 9 7   CREATININE 1.10 1.09 1.20 0.93  CALCIUM 8.3* 8.4* 8.3* 7.9*   GFR: Estimated Creatinine Clearance: 145.2 mL/min (by C-G formula based on Cr  of 0.93). Liver Function Tests:  Recent Labs Lab 02/02/16 0528 02/06/16 2050 02/07/16 0700 02/08/16 0525  AST 31 360* 547* 217*  ALT 48 127* 174* 157*  ALKPHOS 233* 627* 547* 509*  BILITOT 3.9* 3.5* 4.2* 4.9*  PROT 6.1* 6.9 6.1* 5.9*  ALBUMIN 2.0* 2.4* 2.0* 1.9*    Recent Labs Lab 02/06/16 2050  LIPASE 24   No results for input(s): AMMONIA in the last 168 hours. Coagulation Profile:  Recent Labs Lab 02/07/16 0700  INR 1.51*   Cardiac Enzymes: No results for input(s): CKTOTAL, CKMB, CKMBINDEX, TROPONINI in the last 168 hours. BNP (last 3 results) No results for input(s): PROBNP in the last 8760 hours. HbA1C: No results for input(s): HGBA1C in the last 72 hours. CBG:  Recent Labs Lab 02/07/16 1555 02/07/16 2006 02/07/16 2315 02/08/16 0441 02/08/16 0818  GLUCAP 234* 241* 268* 207* 154*   Lipid Profile: No results for input(s): CHOL, HDL, LDLCALC, TRIG, CHOLHDL, LDLDIRECT in the last 72 hours. Thyroid Function Tests: No results for input(s): TSH, T4TOTAL, FREET4, T3FREE, THYROIDAB in the last 72 hours. Anemia Panel: No results for input(s): VITAMINB12, FOLATE, FERRITIN, TIBC, IRON, RETICCTPCT in the last 72 hours. Urine analysis:    Component Value Date/Time   COLORURINE AMBER* 02/07/2016 0001   APPEARANCEUR CLEAR 02/07/2016 0001   LABSPEC >1.046* 02/07/2016 0001   PHURINE 8.5* 02/07/2016 0001   GLUCOSEU NEGATIVE 02/07/2016 0001   HGBUR NEGATIVE 02/07/2016 0001   BILIRUBINUR MODERATE* 02/07/2016 0001   KETONESUR NEGATIVE 02/07/2016 0001   PROTEINUR 30* 02/07/2016 0001   NITRITE NEGATIVE 02/07/2016 0001    LEUKOCYTESUR NEGATIVE 02/07/2016 0001     RAI,RIPUDEEP M.D. [facility] 02/08/2016, 11:24 AM  Pager: [phone] Between 7am to 7pm - call Pager - [phone]  After 7pm go to [email or web address] - password [email or web address]  Call night coverage person covering after 7pm

## 2016-02-08 NOTE — Progress Notes (Signed)
Inpatient Diabetes Program Recommendations  AACE/ADA: New Consensus Statement on Inpatient Glycemic Control (2015)  Target Ranges:  Prepandial:   less than 140 mg/dL      Peak postprandial:   less than 180 mg/dL (1-2 hours)      Critically ill patients:  140 - 180 mg/dL   Results for Timothy [name], Timothy [name] (MRN [id number]) as of 02/08/2016 08:53  Ref. Range 02/07/2016 07:08 02/07/2016 11:46 02/07/2016 15:55 02/07/2016 20:06  Glucose-Capillary Latest Ref Range: 65-99 mg/dL 409110 (H) 811208 (H) 914234 (H) 241 (H)   Results for Timothy [name], Timothy [name] (MRN [id number]) as of 02/08/2016 08:53  Ref. Range 02/07/2016 23:15 02/08/2016 04:41 02/08/2016 08:18  Glucose-Capillary Latest Ref Range: 65-99 mg/dL 086268 (H) 578207 (H) 469154 (H)    Admit with: Possible Sepsis in context of Necrotizing Pancreatitis  History: Pancreatitis Induced Diabetes  Home DM Meds: Lantus 50 units QHS       Novolog 8 units tidwc       Novolog SSI  Current Insulin Orders: Novolog Moderate Correction Scale/ SSI (0-15 units) Q4 hours      MD- Please consider the following in-hospital insulin adjustments:  1. Start a portion of patient's home dose of Lantus- Lantus 25 units QHS (50% home dose)  2. Change Novolog Moderate SSI to TID AC + HS (patient getting solid PO diet)  3. If postprandial CBGs become elevated, please consider starting a portion of patient's home dose of Novolog Meal Coverage as well      --Will follow patient during hospitalization--  Ambrose FinlandJeannine Johnston Sabella Traore RN, MSN, CDE Diabetes Coordinator Inpatient Glycemic Control Team Team Pager: [phone] (8a-5p)

## 2016-02-09 DIAGNOSIS — R402414 Glasgow coma scale score 13-15, 24 hours or more after hospital admission: Secondary | ICD-10-CM | POA: Diagnosis not present

## 2016-02-09 DIAGNOSIS — Z6841 Body Mass Index (BMI) 40.0 and over, adult: Secondary | ICD-10-CM | POA: Diagnosis not present

## 2016-02-09 DIAGNOSIS — E119 Type 2 diabetes mellitus without complications: Secondary | ICD-10-CM | POA: Diagnosis not present

## 2016-02-09 DIAGNOSIS — K863 Pseudocyst of pancreas: Secondary | ICD-10-CM | POA: Diagnosis not present

## 2016-02-09 DIAGNOSIS — R188 Other ascites: Secondary | ICD-10-CM | POA: Diagnosis not present

## 2016-02-09 DIAGNOSIS — K8511 Biliary acute pancreatitis with uninfected necrosis: Secondary | ICD-10-CM | POA: Diagnosis not present

## 2016-02-09 DIAGNOSIS — R791 Abnormal coagulation profile: Secondary | ICD-10-CM | POA: Diagnosis not present

## 2016-02-09 DIAGNOSIS — K862 Cyst of pancreas: Secondary | ICD-10-CM | POA: Diagnosis not present

## 2016-02-09 DIAGNOSIS — D62 Acute posthemorrhagic anemia: Secondary | ICD-10-CM | POA: Diagnosis not present

## 2016-02-09 DIAGNOSIS — K831 Obstruction of bile duct: Secondary | ICD-10-CM | POA: Diagnosis not present

## 2016-02-09 DIAGNOSIS — I10 Essential (primary) hypertension: Secondary | ICD-10-CM | POA: Diagnosis not present

## 2016-02-09 DIAGNOSIS — R600 Localized edema: Secondary | ICD-10-CM | POA: Diagnosis not present

## 2016-02-09 DIAGNOSIS — Z9889 Other specified postprocedural states: Secondary | ICD-10-CM | POA: Diagnosis not present

## 2016-02-09 DIAGNOSIS — Z794 Long term (current) use of insulin: Secondary | ICD-10-CM | POA: Diagnosis not present

## 2016-02-09 DIAGNOSIS — D638 Anemia in other chronic diseases classified elsewhere: Secondary | ICD-10-CM | POA: Diagnosis not present

## 2016-02-09 DIAGNOSIS — D735 Infarction of spleen: Secondary | ICD-10-CM | POA: Diagnosis not present

## 2016-02-09 DIAGNOSIS — I82890 Acute embolism and thrombosis of other specified veins: Secondary | ICD-10-CM | POA: Diagnosis not present

## 2016-02-09 DIAGNOSIS — F411 Generalized anxiety disorder: Secondary | ICD-10-CM | POA: Diagnosis not present

## 2016-02-09 DIAGNOSIS — B079 Viral wart, unspecified: Secondary | ICD-10-CM | POA: Diagnosis not present

## 2016-02-09 DIAGNOSIS — K8591 Acute pancreatitis with uninfected necrosis, unspecified: Secondary | ICD-10-CM | POA: Diagnosis not present

## 2016-02-09 LAB — COMPREHENSIVE METABOLIC PANEL
ALT: 146 U/L — ABNORMAL HIGH (ref 17–63)
AST: 206 U/L — ABNORMAL HIGH (ref 15–41)
Albumin: 2.2 g/dL — ABNORMAL LOW (ref 3.5–5.0)
Alkaline Phosphatase: 586 U/L — ABNORMAL HIGH (ref 38–126)
Anion gap: 7 (ref 5–15)
BUN: 5 mg/dL — ABNORMAL LOW (ref 6–20)
CO2: 23 mmol/L (ref 22–32)
Calcium: 8.2 mg/dL — ABNORMAL LOW (ref 8.9–10.3)
Chloride: 103 mmol/L (ref 101–111)
Creatinine, Ser: 0.89 mg/dL (ref 0.61–1.24)
GFR calc Af Amer: >60 mL/min (ref >60)
GFR calc non Af Amer: >60 mL/min (ref >60)
Glucose, Bld: 148 mg/dL — ABNORMAL HIGH (ref 65–99)
Potassium: 4.2 mmol/L (ref 3.5–5.1)
Sodium: 133 mmol/L — ABNORMAL LOW (ref 135–145)
Total Bilirubin: 5 mg/dL — ABNORMAL HIGH (ref 0.3–1.2)
Total Protein: 6.7 g/dL (ref 6.5–8.1)

## 2016-02-09 LAB — CBC
HCT: 27.3 % — ABNORMAL LOW (ref 39.0–52.0)
Hemoglobin: 8.7 g/dL — ABNORMAL LOW (ref 13.0–17.0)
MCH: 28.5 pg (ref 26.0–34.0)
MCHC: 31.9 g/dL (ref 30.0–36.0)
MCV: 89.5 fL (ref 78.0–100.0)
Platelets: 518 10*3/uL — ABNORMAL HIGH (ref 150–400)
RBC: 3.05 MIL/uL — ABNORMAL LOW (ref 4.22–5.81)
RDW: 16.1 % — ABNORMAL HIGH (ref 11.5–15.5)
WBC: 11.6 10*3/uL — ABNORMAL HIGH (ref 4.0–10.5)

## 2016-02-09 LAB — GLUCOSE, CAPILLARY
Glucose-Capillary: 161 mg/dL — ABNORMAL HIGH (ref 65–99)
Glucose-Capillary: 208 mg/dL — ABNORMAL HIGH (ref 65–99)

## 2016-02-09 MED ORDER — INSULIN GLARGINE 100 UNIT/ML SOLOSTAR PEN: 25.0000 [IU] | PEN_INJECTOR | Freq: Every day | SUBCUTANEOUS | Status: AC

## 2016-02-09 MED ORDER — HYDROMORPHONE HCL 1 MG/ML IJ SOLN: 1.0000 mg | INTRAMUSCULAR | Status: AC | PRN

## 2016-02-09 MED ORDER — SODIUM CHLORIDE 0.9 % IV SOLN: 500.0000 mg | Freq: Four times a day (QID) | INTRAVENOUS | Status: AC

## 2016-02-09 NOTE — Progress Notes (Signed)
Patient stated that he was worried about the amount of fluids that he has been given because his feet, arms, and stomach continues to be swollen. MD paged. Awaiting any further orders

## 2016-02-09 NOTE — Care Management Note (Signed)
Case Management Note  Patient Details  Name: Carolin CoyQuinten Wenker MRN: [id number] Date of Birth: May 31, 1976  Subjective/Objective:                    Action/Plan:  Per DC note, [facility]   Expected Discharge Date:                  Expected Discharge Plan:  Acute to Acute Transfer  In-House Referral:     Discharge planning Services  CM Consult  Post Acute Care Choice:    Choice offered to:     DME Arranged:    DME Agency:     HH Arranged:    HH Agency:     Status of Service:  Completed, signed off  If discussed at [employer] Length of Stay Meetings, dates discussed:    Additional Comments:  Lawerance SabalDebbie Arbie Blankley, RN 02/09/2016, 4:17 PM

## 2016-02-09 NOTE — Progress Notes (Signed)
[facility]                                                                              Patient Demographics  Timothy [name], is a 40 y.o. male, DOB - 26-Feb-1976, JYN:[id number]  Admit date - 02/06/2016   Admitting Physician Alberteen Sam, MD  Outpatient Primary MD for the patient is Clayborn Heron, MD  Outpatient specialists:   LOS - 2  days    Chief Complaint  Patient presents with  . Fever  . Post-op Problem       Brief summary   Patient is a 40 y.o. male with history significant for recent necrotizing pancreatitis, however secondary IDDM, recent biliary stent, and pancreatic pseudocyst who presented with acute fever/chills. He had no significant medical problems until May of this year when he developed sudden onset severe epigastric pain and was admitted with pancreatitis on 5/22. This hospitalization was complicated by ARDS requiring intubation, accelerated HTN, new onset IDDM, and anemia. Suspected gallstone pancreatitis. Readmitted 2 weeks ago with jaundice and TBili >9 and was found to have a new large peripancreatic fluid collection 24 x 14 x 10 cm causing mass effect on the biliary tree. This was treated with antibiotics and then ERCP with biliary stent placed on 01/31/16 by Dr. Elnoria Howard and discharged home again on 7/4. On the night of admission, patient started having fevers and chills, epigastric and right upper quadrant abdominal pain, weakness. No nausea or vomiting. In ED patient was noted to be febrile 103F, creatinine 1.1, hemoglobin 8.3, baseline around 8. AST ALT and total bilirubin elevated. Lipase normal. CT abdomen and pelvis with contrast showed stable 22 x 13 x 15 complex pseudocyst, upstream biliary ductal dilatation, and known splenic vein thrombosis with splenomegaly. GI was consulted and patient was admitted for further workup.   Assessment & Plan    Principal Problem: SIRS in context of necrotizing pancreatitis: Concern  forcholangitis versus infected pseudocyst with biliary obstruction, elevated transaminitis and hyperbilirubinemia - Currently pain controlled, GI was consulted, possible malfunction of biliary stent due to migration or occlusion.  - Blood cultures negative so far, continue IV imipenem, LFTs are improving - Discussed with gastroenterology, Dr. Dulce Sellar  who have spoken with gastroenterology at [facility], patient is on the waiting list to be transferred when bed is available.    Pancreatic pseudocyst - CT abdomen and pelvis showed stable large complex pancreatic pseudocyst, will defer to GI at [facility] if pseudocyst needs aspiration  Pancreatitis induced diabetes:  - Restarted Lantus at half dose, sliding scale insulin  Hypotension with underlying history of Hypertension:  -BP currently stable, continue to hold carvedilol and clonidine patch    Normocytic anemia:  Stable -Trend CBC   Cough: This has been troublesome for patient, per notes. -Continue cough syrup PRN  Code Status:Full CODE STATUS  DVT Prophylaxis:  SCD's Family Communication: Discussed in detail with the patient, all imaging results, lab results explained to the patient  Disposition Plan:  potential transfer to [facility] once bed available  Time Spent in minutes   15 minutes  Procedures:  CT abd  Consultants:   GI  Antimicrobials:   IV imipenem   Medications  Scheduled Meds: . imipenem-cilastatin  500 mg Intravenous Q6H  . insulin aspart  0-15 Units Subcutaneous TID WC  . insulin aspart  0-5 Units Subcutaneous QHS  . insulin glargine  25 Units Subcutaneous QHS  . sodium chloride flush  3 mL Intravenous Q12H   Continuous Infusions:   PRN Meds:.acetaminophen, guaiFENesin-codeine, HYDROmorphone (DILAUDID) injection, ondansetron **OR** ondansetron (ZOFRAN) IV   Antibiotics   Anti-infectives    Start     Dose/Rate Route Frequency Ordered Stop   02/07/16 2000  cefTRIAXone (ROCEPHIN) 2 g in  dextrose 5 % 50 mL IVPB  Status:  Discontinued     2 g 100 mL/hr over 30 Minutes Intravenous Every 24 hours 02/07/16 0311 02/07/16 1717   02/07/16 1800  imipenem-cilastatin (PRIMAXIN) 500 mg in sodium chloride 0.9 % 100 mL IVPB     500 mg 200 mL/hr over 30 Minutes Intravenous Every 6 hours 02/07/16 1717     02/07/16 0030  cefTRIAXone (ROCEPHIN) 2 g in dextrose 5 % 50 mL IVPB     2 g 100 mL/hr over 30 Minutes Intravenous  Once 02/07/16 0029 02/07/16 0119        Subjective:   Timothy [name] was seen and examined today. No complaints, low-grade temperature earlier last night. Otherwise no nausea, vomiting or any abdominal pain. Awaiting transfer to [facility]  Patient denies dizziness, chest pain, shortness of breath,  new weakness, numbess, tingling. No acute events overnight.    Objective:   Filed Vitals:   02/08/16 1349 02/08/16 1449 02/08/16 2220 02/09/16 0520  BP:  140/79 143/81 143/81  Pulse:  83 87 83  Temp:   99.8 F (37.7 C) 97.8 F (36.6 C)  TempSrc:   Oral Oral  Resp:  Height:      Weight:      SpO2: 94% 98% 95% 94%    Intake/Output Summary (Last 24 hours) at 02/09/16 1320 Last data filed at 02/08/16 1400  Gross per 24 hour  Intake     60 ml  Output      0 ml  Net     60 ml     Wt Readings from Last 3 Encounters:  02/08/16 132.6 kg (292 lb 5.3 oz)  01/29/16 119.5 kg (263 lb 7.2 oz)  01/21/16 125.193 kg (276 lb)     Exam  General: Alert and oriented x 3, NAD  HEENT:    Neck: Supple, no JVD  Cardiovascular: S1 S2 clear, RRR  Respiratory: CTAB  Gastrointestinal: Obese, Soft, Nontender, ND, + bowel sounds.  Ext: no cyanosis clubbing or edema  Neuro: no new deficits  Skin: No rashes  Psych: Normal affect and demeanor, alert and oriented x3    Data Reviewed:  I have personally reviewed following labs and imaging studies  Micro Results Recent Results (from the past 240 hour(s))  Culture, blood (Routine X 2) w Reflex to ID  Panel     Status: None (Preliminary result)   Collection Time: 02/06/16  8:50 PM  Result Value Ref Range Status   Specimen Description BLOOD RIGHT ANTECUBITAL  Final   Special Requests BOTTLES DRAWN AEROBIC AND ANAEROBIC EACH  Final   Culture   Final    NO GROWTH 1 DAY Performed at [facility]    Report Status PENDING  Incomplete  Culture, blood (Routine X 2) w Reflex to ID Panel     Status: None (Preliminary result)  Collection Time: 02/06/16  9:25 PM  Result Value Ref Range Status   Specimen Description BLOOD LEFT ANTECUBITAL  Final   Special Requests   Final    BOTTLES DRAWN AEROBIC AND ANAEROBIC AER 10cc ANA 10cc   Culture   Final    NO GROWTH 1 DAY Performed at [facility]    Report Status PENDING  Incomplete  MRSA PCR Screening     Status: None   Collection Time: 02/07/16  3:35 AM  Result Value Ref Range Status   MRSA by PCR NEGATIVE NEGATIVE Final    Comment:        The GeneXpert MRSA Assay (FDA approved for NASAL specimens only), is one component of a comprehensive MRSA colonization surveillance program. It is not intended to diagnose MRSA infection nor to guide or monitor treatment for MRSA infections.     Radiology Reports Dg Chest 2 View  02/06/2016  CLINICAL DATA:  Epigastric tenderness. History of necrotizing pancreatitis. Fever. EXAM: CHEST  2 VIEW COMPARISON:  Chest radiograph 01/30/2016, included lung bases from concurrent CT. FINDINGS: Lung volumes are low. Bibasilar and perihilar atelectasis with small left pleural effusion. Heart size is normal for technique. No pulmonary edema. No pneumothorax. IMPRESSION: Low lung volumes with perihilar and bibasilar atelectasis. Small left pleural effusion. Electronically Signed   By: Rubye Oaks M.D.   On: 02/06/2016 23:37   Dg Chest 2 View  01/30/2016  CLINICAL DATA:  Cough EXAM: CHEST  2 VIEW COMPARISON:  01/13/2016 FINDINGS: Heart size and vascularity within normal limits. Mild atelectasis  or scarring in the lung bases. Right perihilar atelectasis has resolved. 1 cm nodular density below the left hilum is similar to the prior study and may be due to atelectasis overlying blood vessels. No pleural effusion. Negative for edema. IMPRESSION: Bibasilar atelectasis/ infiltrate. Left lower hilar density likely due to airspace disease. No mass lesion in this area on recent chest CT 12/21/2015 Electronically Signed   By: Marlan Palau M.D.   On: 01/30/2016 16:13   Dg Chest 2 View  01/13/2016  CLINICAL DATA:  The pt is c/o abd an elevated temp today with a cough. He was recently admitted for pancreatitis from gallstones. His abd feel bloated No pain EXAM: CHEST - 2 VIEW COMPARISON:  12/29/2015 FINDINGS: Patient has been extubated, the central line and nasogastric tube removed. Linear right perihilar scarring or atelectasis persists. Left lung remains clear.  Heart size normal.  No pneumothorax. No effusion. Visualized bones unremarkable. IMPRESSION: 1. Persistent linear scarring or atelectasis at the right hilum. No acute findings. Electronically Signed   By: Corlis Leak M.D.   On: 01/13/2016 23:00   Ct Abdomen Pelvis W Contrast  02/06/2016  CLINICAL DATA:  40 year old male with sudden onset of fever today approximately 7 p.m. Six days status post ERCP. EXAM: CT ABDOMEN AND PELVIS WITH CONTRAST TECHNIQUE: Multidetector CT imaging of the abdomen and pelvis was performed using the standard protocol following bolus administration of intravenous contrast. CONTRAST:  ISOVUE-300 IOPAMIDOL (ISOVUE-300) INJECTION 61% COMPARISON:  Multiple priors, most recently CT of the abdomen and pelvis 01/29/2016. FINDINGS: Lower chest: Areas of scarring and/or subsegmental atelectasis noted throughout the visualized lung bases. Trace bilateral pleural effusions lying dependently. Hepatobiliary: Moderate intrahepatic biliary ductal dilatation. No definite cystic or solid hepatic lesions. Gallbladder is moderately  distended. Gallbladder wall appears mildly thickened up to 4 mm. There is some dependent material in the gallbladder which is intermediate attenuation, likely to represent noncalcified gallstones or  biliary sludge balls. A biliary stent extends from the distal common bile duct into the third portion of the duodenum. Pancreas: The pancreatic parenchyma is largely replaced by a large low-attenuation fluid collection which currently measures up to 22.6 x 13.7 x 15.3 cm, compatible with a complex pancreatic pseudocyst. This is predominantly low-attenuation, but has some internal fatty components within it, presumably from saponification. Thick rim of enhancement surrounding the pseudocyst is noted. The borders of this pseudocyst are sometimes relatively well-defined, and at other times infiltrative, particularly with respect to the root of the small bowel mesentery into which the pseudocyst appears to have invaginated downward, with several finger-like projections adjacent to the branches of the superior mesenteric artery. This large pseudocyst again exerts mass effect upon adjacent structures, including common bile duct which is displaced laterally. Spleen: Spleen is enlarged measuring 16.6 x 7.7 x 12.9 cm (estimated splenic volume of 824 mL). Adrenals/Urinary Tract: Bilateral kidneys and bilateral adrenal glands are grossly normal in appearance. No hydroureteronephrosis. Urinary bladder is normal in appearance. Stomach/Bowel: Normal appearance of the stomach. Duodenum is grossly distorted by mass effect from the large pancreatic pseudocyst such that the duodenal C-loop is draped over the portion of the pseudocyst in the region of the pancreatic head. No pathologic dilatation of small bowel or colon. The appendix is not confidently identified and may be surgically absent. Regardless, there are no inflammatory changes noted adjacent to the cecum to suggest the presence of an acute appendicitis at this time.  Vascular/Lymphatic: No significant atherosclerotic disease, aneurysm or dissection identified in the abdominal or pelvic vasculature. Complete occlusion of the splenic volume again noted. At this time, the superior mesenteric vein, splenoportal confluence and portal vein appear grossly patent, although narrowed. Multiple borderline enlarged and mildly enlarged retroperitoneal and upper abdominal lymph nodes are noted, measuring up to 1.1 cm in short axis in the left para-aortic nodal station. Reproductive: Prostate gland and seminal vesicles are unremarkable in appearance. Other: Small volume of ascites predominantly in the pericolic gutters and low anatomic pelvis. No pneumoperitoneum. Musculoskeletal: Body wall edema. There are no aggressive appearing lytic or blastic lesions noted in the visualized portions of the skeleton. IMPRESSION: 1. Large complex pancreatic pseudocyst appears similar to the prior study, and is again compatible with necrotizing pancreatitis. This is again associated with thrombosis of the splenic vein. 2. Compared to the prior examination there has been interval placement of a biliary stent which extends from the common bile duct into the third portion of the duodenum. Despite the presence of this stent there continues to be moderate intrahepatic biliary ductal dilatation. 3. Noncalcified gallstones versus biliary sludge balls lying dependently in the gallbladder. Gallbladder wall appears mildly thickened. This is favored to be reactive secondary to inflammation from necrotizing pancreatitis, rather than indicative of underlying cholecystitis, but clinical correlation is recommended. If there is any clinical concern for acute cholecystitis, further evaluation with right upper quadrant abdominal ultrasound could be performed. 4. Splenomegaly. 5. Trace bilateral pleural effusions. 6. Additional incidental findings, as above. Electronically Signed   By: Trudie Reedaniel  Entrikin M.D.   On: 02/06/2016  23:47   Ct Abdomen Pelvis W Contrast  01/29/2016  CLINICAL DATA:  Follow-up pancreatitis. EXAM: CT ABDOMEN AND PELVIS WITH CONTRAST TECHNIQUE: Multidetector CT imaging of the abdomen and pelvis was performed using the standard protocol following bolus administration of intravenous contrast. CONTRAST:  125mL ISOVUE-300 IOPAMIDOL (ISOVUE-300) INJECTION 61% COMPARISON:  CT scans since Dec 21, 2015 with the most recent comparison from January 02, 2016 FINDINGS: Mild atelectasis in the left lung base. A small calcified granuloma is seen in the right lung base. Lung bases are otherwise unchanged and unremarkable. No free air. Necrotizing pancreatitis is again identified. The pancreatic body and tail are no longer visualized due to necrosis. There is a large peripancreatic fluid collection measuring 24 x 14 cm on series 2, image 41. A few adjacent smaller collections are also seen such as anterior to the SMV on image 51. There is extensive fat stranding in the surrounding tissues. The peripancreatic fluid collection exerts mass effect on the distal common bile duct resulting in intra and extrahepatic biliary duct dilatation which is a new finding. No focal liver masses are identified. The hepatic veins are patent. There is narrowing of the portal vein as it passes by the peripancreatic collection. This narrowing is best seen on coronal images 49 through 55. No definitive portal vein thrombosis is identified at this time. The SMV remains patent. The splenic vein is no longer visualized. The gallbladder is markedly distended which correlates with the central common bile duct. The spleen is slightly more prominent in the interval but otherwise normal. Some collateral splenic vessels are identified near the splenic hilum. The adrenal glands and kidneys are normal. The abdominal aorta is normal in caliber with no atherosclerosis. No adenopathy identified. The peripancreatic fluid collection exerts mass effect on the second  portion the duodenum without gastric distention. The small bowel is otherwise normal. The colon is normal. No secondary evidence of appendicitis. No adenopathy or mass in the pelvis. The bladder is normal. The prostate and seminal vesicles are unremarkable. Delayed images demonstrate no filling defects in the upper renal collecting systems. Visualized bones are unremarkable other than mild degenerative changes in the spine. IMPRESSION: 1. Necrotizing pancreatitis with interval development of a large 24 x 14 cm peripancreatic fluid collection. The fluid collection exerts mass effect on the central common bile duct resulting in intra and extrahepatic biliary duct dilatation and gallbladder distention, both of which are new findings. No blood flow is seen in the splenic vein. The peripancreatic collection exerts mass effect on the portal vein as it passes the collection with significant narrowing but no definitive thrombosis. These results will be called to the ordering clinician or representative by the Radiologist Assistant, and communication documented in the PACS or zVision Dashboard. Electronically Signed   By: Gerome Sam III M.D   On: 01/29/2016 13:44   Dg Ercp  01/31/2016  CLINICAL DATA:  Biliary obstruction EXAM: ERCP TECHNIQUE: Multiple spot images obtained with the fluoroscopic device and submitted for interpretation post-procedure. FLUOROSCOPY TIME:  3 minutes 29 seconds COMPARISON:  CT 01/29/2016 FINDINGS: Two spot images document endoscopy. A plastic stent extends from the proximal CBD across the expected level of the ampulla. There is opacification of the proximal CBD, which is not visualized in the mid and distal segments. Intrahepatic ducts are incompletely opacified and visualized. IMPRESSION: Plastic biliary stent placement across the distal CBD. These images were submitted for radiologic interpretation only. Please see the procedural report for the amount of contrast and the fluoroscopy time  utilized. Electronically Signed   By: Corlis Leak M.D.   On: 01/31/2016 09:13    Lab Data:  CBC:  Recent Labs Lab 02/06/16 2050 02/07/16 0700 02/08/16 0525 02/09/16 0736  WBC 10.0 11.9* 10.1 11.6*  NEUTROABS 8.4*  --  8.5*  --   HGB 8.3* 7.8* 7.7* 8.7*  HCT 25.8* 25.4* 23.7* 27.3*  MCV 90.5 91.4  88.8 89.5  PLT 422* 375 408* 518*   Basic Metabolic Panel:  Recent Labs Lab 02/06/16 2050 02/07/16 0700 02/08/16 0525 02/09/16 0736  NA 135 136 133* 133*  K 4.1 4.1 4.2 4.2  CL 102 104 104 103  CO2 24 24 23 23   GLUCOSE 80 110* 195* 148*  BUN 10 9 7  5*  CREATININE 1.09 1.20 0.93 0.89  CALCIUM 8.4* 8.3* 7.9* 8.2*   GFR: Estimated Creatinine Clearance: 151.7 mL/min (by C-G formula based on Cr of 0.89). Liver Function Tests:  Recent Labs Lab 02/06/16 2050 02/07/16 0700 02/08/16 0525 02/09/16 0736  AST 360* 547* 217* 206*  ALT 127* 174* 157* 146*  ALKPHOS 627* 547* 509* 586*  BILITOT 3.5* 4.2* 4.9* 5.0*  PROT 6.9 6.1* 5.9* 6.7  ALBUMIN 2.4* 2.0* 1.9* 2.2*    Recent Labs Lab 02/06/16 2050  LIPASE 24   No results for input(s): AMMONIA in the last 168 hours. Coagulation Profile:  Recent Labs Lab 02/07/16 0700  INR 1.51*   Cardiac Enzymes: No results for input(s): CKTOTAL, CKMB, CKMBINDEX, TROPONINI in the last 168 hours. BNP (last 3 results) No results for input(s): PROBNP in the last 8760 hours. HbA1C: No results for input(s): HGBA1C in the last 72 hours. CBG:  Recent Labs Lab 02/08/16 1139 02/08/16 1717 02/08/16 2217 02/09/16 0907 02/09/16 1200  GLUCAP 217* 169* 174* 161* 208*   Lipid Profile: No results for input(s): CHOL, HDL, LDLCALC, TRIG, CHOLHDL, LDLDIRECT in the last 72 hours. Thyroid Function Tests: No results for input(s): TSH, T4TOTAL, FREET4, T3FREE, THYROIDAB in the last 72 hours. Anemia Panel: No results for input(s): VITAMINB12, FOLATE, FERRITIN, TIBC, IRON, RETICCTPCT in the last 72 hours. Urine analysis:    Component Value  Date/Time   COLORURINE AMBER* 02/07/2016 0001   APPEARANCEUR CLEAR 02/07/2016 0001   LABSPEC >1.046* 02/07/2016 0001   PHURINE 8.5* 02/07/2016 0001   GLUCOSEU NEGATIVE 02/07/2016 0001   HGBUR NEGATIVE 02/07/2016 0001   BILIRUBINUR MODERATE* 02/07/2016 0001   KETONESUR NEGATIVE 02/07/2016 0001   PROTEINUR 30* 02/07/2016 0001   NITRITE NEGATIVE 02/07/2016 0001   LEUKOCYTESUR NEGATIVE 02/07/2016 0001     Aolanis Crispen M.D. [facility] 02/09/2016, 1:20 PM  Pager: [phone] Between 7am to 7pm - call Pager - [phone]  After 7pm go to [email or web address] - password [email or web address]  Call night coverage person covering after 7pm

## 2016-02-09 NOTE — Progress Notes (Signed)
[facility] Pt. Is alert and oriented. Pt is hemodynamically stable. Report called. IV in place. Discharge plan appropriate and in place.

## 2016-02-09 NOTE — Progress Notes (Signed)
[facility]  [facility] will sign-off; please let us know if we can be of further assistance.

## 2016-02-10 DIAGNOSIS — Z794 Long term (current) use of insulin: Secondary | ICD-10-CM | POA: Diagnosis not present

## 2016-02-10 DIAGNOSIS — I10 Essential (primary) hypertension: Secondary | ICD-10-CM | POA: Diagnosis not present

## 2016-02-10 DIAGNOSIS — K8511 Biliary acute pancreatitis with uninfected necrosis: Secondary | ICD-10-CM | POA: Diagnosis not present

## 2016-02-10 DIAGNOSIS — K863 Pseudocyst of pancreas: Secondary | ICD-10-CM | POA: Diagnosis not present

## 2016-02-10 DIAGNOSIS — R188 Other ascites: Secondary | ICD-10-CM | POA: Diagnosis not present

## 2016-02-10 DIAGNOSIS — E119 Type 2 diabetes mellitus without complications: Secondary | ICD-10-CM | POA: Diagnosis not present

## 2016-02-10 DIAGNOSIS — K8591 Acute pancreatitis with uninfected necrosis, unspecified: Secondary | ICD-10-CM | POA: Diagnosis not present

## 2016-02-11 ENCOUNTER — Other Ambulatory Visit (HOSPITAL_COMMUNITY): Payer: Federal, State, Local not specified - PPO

## 2016-02-11 DIAGNOSIS — K863 Pseudocyst of pancreas: Secondary | ICD-10-CM | POA: Diagnosis not present

## 2016-02-11 DIAGNOSIS — I10 Essential (primary) hypertension: Secondary | ICD-10-CM | POA: Diagnosis not present

## 2016-02-11 DIAGNOSIS — E119 Type 2 diabetes mellitus without complications: Secondary | ICD-10-CM | POA: Diagnosis not present

## 2016-02-11 DIAGNOSIS — K8591 Acute pancreatitis with uninfected necrosis, unspecified: Secondary | ICD-10-CM | POA: Diagnosis not present

## 2016-02-11 DIAGNOSIS — I82890 Acute embolism and thrombosis of other specified veins: Secondary | ICD-10-CM | POA: Diagnosis not present

## 2016-02-12 DIAGNOSIS — K863 Pseudocyst of pancreas: Secondary | ICD-10-CM | POA: Diagnosis not present

## 2016-02-12 DIAGNOSIS — K8591 Acute pancreatitis with uninfected necrosis, unspecified: Secondary | ICD-10-CM | POA: Diagnosis not present

## 2016-02-12 DIAGNOSIS — I10 Essential (primary) hypertension: Secondary | ICD-10-CM | POA: Diagnosis not present

## 2016-02-12 DIAGNOSIS — D638 Anemia in other chronic diseases classified elsewhere: Secondary | ICD-10-CM | POA: Diagnosis not present

## 2016-02-12 LAB — CULTURE, BLOOD (ROUTINE X 2)
Culture: NO GROWTH
Culture: NO GROWTH

## 2016-02-13 DIAGNOSIS — K8591 Acute pancreatitis with uninfected necrosis, unspecified: Secondary | ICD-10-CM | POA: Diagnosis not present

## 2016-02-13 DIAGNOSIS — I10 Essential (primary) hypertension: Secondary | ICD-10-CM | POA: Diagnosis not present

## 2016-02-13 DIAGNOSIS — K863 Pseudocyst of pancreas: Secondary | ICD-10-CM | POA: Diagnosis not present

## 2016-02-13 DIAGNOSIS — D638 Anemia in other chronic diseases classified elsewhere: Secondary | ICD-10-CM | POA: Diagnosis not present

## 2016-02-14 DIAGNOSIS — D638 Anemia in other chronic diseases classified elsewhere: Secondary | ICD-10-CM | POA: Diagnosis not present

## 2016-02-14 DIAGNOSIS — K8591 Acute pancreatitis with uninfected necrosis, unspecified: Secondary | ICD-10-CM | POA: Diagnosis not present

## 2016-02-14 DIAGNOSIS — K863 Pseudocyst of pancreas: Secondary | ICD-10-CM | POA: Diagnosis not present

## 2016-02-14 DIAGNOSIS — I10 Essential (primary) hypertension: Secondary | ICD-10-CM | POA: Diagnosis not present

## 2016-02-15 DIAGNOSIS — I10 Essential (primary) hypertension: Secondary | ICD-10-CM | POA: Diagnosis not present

## 2016-02-15 DIAGNOSIS — K8591 Acute pancreatitis with uninfected necrosis, unspecified: Secondary | ICD-10-CM | POA: Diagnosis not present

## 2016-02-15 DIAGNOSIS — D638 Anemia in other chronic diseases classified elsewhere: Secondary | ICD-10-CM | POA: Diagnosis not present

## 2016-02-15 DIAGNOSIS — K863 Pseudocyst of pancreas: Secondary | ICD-10-CM | POA: Diagnosis not present

## 2016-02-15 DIAGNOSIS — K862 Cyst of pancreas: Secondary | ICD-10-CM | POA: Diagnosis not present

## 2016-02-16 DIAGNOSIS — K8591 Acute pancreatitis with uninfected necrosis, unspecified: Secondary | ICD-10-CM | POA: Diagnosis not present

## 2016-02-16 DIAGNOSIS — F411 Generalized anxiety disorder: Secondary | ICD-10-CM | POA: Diagnosis not present

## 2016-02-16 DIAGNOSIS — K863 Pseudocyst of pancreas: Secondary | ICD-10-CM | POA: Diagnosis not present

## 2016-02-16 DIAGNOSIS — I10 Essential (primary) hypertension: Secondary | ICD-10-CM | POA: Diagnosis not present

## 2016-02-16 DIAGNOSIS — Z9889 Other specified postprocedural states: Secondary | ICD-10-CM | POA: Diagnosis not present

## 2016-02-16 DIAGNOSIS — E119 Type 2 diabetes mellitus without complications: Secondary | ICD-10-CM | POA: Diagnosis not present

## 2016-02-17 ENCOUNTER — Other Ambulatory Visit: Payer: Federal, State, Local not specified - PPO

## 2016-02-17 DIAGNOSIS — I10 Essential (primary) hypertension: Secondary | ICD-10-CM | POA: Diagnosis not present

## 2016-02-17 DIAGNOSIS — E119 Type 2 diabetes mellitus without complications: Secondary | ICD-10-CM | POA: Diagnosis not present

## 2016-02-17 DIAGNOSIS — K8591 Acute pancreatitis with uninfected necrosis, unspecified: Secondary | ICD-10-CM | POA: Diagnosis not present

## 2016-02-18 DIAGNOSIS — I10 Essential (primary) hypertension: Secondary | ICD-10-CM | POA: Diagnosis not present

## 2016-02-18 DIAGNOSIS — K863 Pseudocyst of pancreas: Secondary | ICD-10-CM | POA: Diagnosis not present

## 2016-02-18 DIAGNOSIS — K8591 Acute pancreatitis with uninfected necrosis, unspecified: Secondary | ICD-10-CM | POA: Diagnosis not present

## 2016-02-18 DIAGNOSIS — K8511 Biliary acute pancreatitis with uninfected necrosis: Secondary | ICD-10-CM | POA: Diagnosis not present

## 2016-02-18 DIAGNOSIS — Z9889 Other specified postprocedural states: Secondary | ICD-10-CM | POA: Diagnosis not present

## 2016-02-19 DIAGNOSIS — B079 Viral wart, unspecified: Secondary | ICD-10-CM | POA: Diagnosis not present

## 2016-02-19 DIAGNOSIS — K863 Pseudocyst of pancreas: Secondary | ICD-10-CM | POA: Diagnosis not present

## 2016-02-19 DIAGNOSIS — K8591 Acute pancreatitis with uninfected necrosis, unspecified: Secondary | ICD-10-CM | POA: Diagnosis not present

## 2016-02-19 DIAGNOSIS — I82890 Acute embolism and thrombosis of other specified veins: Secondary | ICD-10-CM | POA: Diagnosis not present

## 2016-02-20 DIAGNOSIS — E46 Unspecified protein-calorie malnutrition: Secondary | ICD-10-CM | POA: Diagnosis not present

## 2016-02-20 DIAGNOSIS — I10 Essential (primary) hypertension: Secondary | ICD-10-CM | POA: Diagnosis not present

## 2016-02-20 DIAGNOSIS — D638 Anemia in other chronic diseases classified elsewhere: Secondary | ICD-10-CM | POA: Diagnosis not present

## 2016-02-20 DIAGNOSIS — E1165 Type 2 diabetes mellitus with hyperglycemia: Secondary | ICD-10-CM | POA: Diagnosis not present

## 2016-02-20 DIAGNOSIS — T07 Unspecified multiple injuries: Secondary | ICD-10-CM | POA: Diagnosis not present

## 2016-02-20 DIAGNOSIS — K8592 Acute pancreatitis with infected necrosis, unspecified: Secondary | ICD-10-CM | POA: Diagnosis not present

## 2016-02-20 DIAGNOSIS — Z9181 History of falling: Secondary | ICD-10-CM | POA: Diagnosis not present

## 2016-02-20 DIAGNOSIS — Z794 Long term (current) use of insulin: Secondary | ICD-10-CM | POA: Diagnosis not present

## 2016-02-22 DIAGNOSIS — I10 Essential (primary) hypertension: Secondary | ICD-10-CM | POA: Diagnosis not present

## 2016-02-22 DIAGNOSIS — E46 Unspecified protein-calorie malnutrition: Secondary | ICD-10-CM | POA: Diagnosis not present

## 2016-02-22 DIAGNOSIS — T07 Unspecified multiple injuries: Secondary | ICD-10-CM | POA: Diagnosis not present

## 2016-02-22 DIAGNOSIS — E1165 Type 2 diabetes mellitus with hyperglycemia: Secondary | ICD-10-CM | POA: Diagnosis not present

## 2016-02-22 DIAGNOSIS — Z9181 History of falling: Secondary | ICD-10-CM | POA: Diagnosis not present

## 2016-02-22 DIAGNOSIS — Z794 Long term (current) use of insulin: Secondary | ICD-10-CM | POA: Diagnosis not present

## 2016-02-22 DIAGNOSIS — K8592 Acute pancreatitis with infected necrosis, unspecified: Secondary | ICD-10-CM | POA: Diagnosis not present

## 2016-02-22 DIAGNOSIS — D638 Anemia in other chronic diseases classified elsewhere: Secondary | ICD-10-CM | POA: Diagnosis not present

## 2016-02-23 DIAGNOSIS — E109 Type 1 diabetes mellitus without complications: Secondary | ICD-10-CM | POA: Diagnosis not present

## 2016-02-23 DIAGNOSIS — R5381 Other malaise: Secondary | ICD-10-CM | POA: Diagnosis not present

## 2016-02-23 DIAGNOSIS — K8591 Acute pancreatitis with uninfected necrosis, unspecified: Secondary | ICD-10-CM | POA: Diagnosis not present

## 2016-02-23 DIAGNOSIS — I10 Essential (primary) hypertension: Secondary | ICD-10-CM | POA: Diagnosis not present

## 2016-02-24 DIAGNOSIS — T07 Unspecified multiple injuries: Secondary | ICD-10-CM | POA: Diagnosis not present

## 2016-02-24 DIAGNOSIS — E46 Unspecified protein-calorie malnutrition: Secondary | ICD-10-CM | POA: Diagnosis not present

## 2016-02-24 DIAGNOSIS — Z794 Long term (current) use of insulin: Secondary | ICD-10-CM | POA: Diagnosis not present

## 2016-02-24 DIAGNOSIS — D638 Anemia in other chronic diseases classified elsewhere: Secondary | ICD-10-CM | POA: Diagnosis not present

## 2016-02-24 DIAGNOSIS — I10 Essential (primary) hypertension: Secondary | ICD-10-CM | POA: Diagnosis not present

## 2016-02-24 DIAGNOSIS — Z9181 History of falling: Secondary | ICD-10-CM | POA: Diagnosis not present

## 2016-02-24 DIAGNOSIS — K8592 Acute pancreatitis with infected necrosis, unspecified: Secondary | ICD-10-CM | POA: Diagnosis not present

## 2016-02-24 DIAGNOSIS — E1165 Type 2 diabetes mellitus with hyperglycemia: Secondary | ICD-10-CM | POA: Diagnosis not present

## 2016-02-25 ENCOUNTER — Encounter: Payer: Self-pay | Admitting: Physician Assistant

## 2016-02-25 DIAGNOSIS — T888 Other specified complications of surgical and medical care, not elsewhere classified: Secondary | ICD-10-CM | POA: Diagnosis not present

## 2016-02-25 DIAGNOSIS — E119 Type 2 diabetes mellitus without complications: Secondary | ICD-10-CM | POA: Diagnosis not present

## 2016-02-25 DIAGNOSIS — K8689 Other specified diseases of pancreas: Secondary | ICD-10-CM | POA: Diagnosis not present

## 2016-02-25 DIAGNOSIS — K859 Acute pancreatitis without necrosis or infection, unspecified: Secondary | ICD-10-CM | POA: Diagnosis not present

## 2016-02-25 DIAGNOSIS — I10 Essential (primary) hypertension: Secondary | ICD-10-CM | POA: Diagnosis not present

## 2016-02-25 DIAGNOSIS — K863 Pseudocyst of pancreas: Secondary | ICD-10-CM | POA: Diagnosis not present

## 2016-02-25 DIAGNOSIS — K228 Other specified diseases of esophagus: Secondary | ICD-10-CM | POA: Diagnosis not present

## 2016-02-25 DIAGNOSIS — Z794 Long term (current) use of insulin: Secondary | ICD-10-CM | POA: Diagnosis not present

## 2016-02-25 DIAGNOSIS — T07 Unspecified multiple injuries: Secondary | ICD-10-CM | POA: Diagnosis not present

## 2016-02-25 DIAGNOSIS — K8511 Biliary acute pancreatitis with uninfected necrosis: Secondary | ICD-10-CM | POA: Diagnosis not present

## 2016-02-25 NOTE — Progress Notes (Deleted)
 Cardiology Office Note    Date:  02/25/2016  ID:  Timothy [name], DOB 08/31/1975, MRN [id number] PCP:  Victoria R Rankins, MD  Cardiologist:  Seen by Dr. Taylor 6/22  ***refresh  - This is a preliminary note being prepped for an upcoming appointment.   Chief Complaint: f/u HTN  History of Present Illness:  Timothy [name] is a 39 y.o. male with history of HTN, obesity, recently developed IDDM in the setting of severe necrotizing pancreatitis who presents for f/u HTN.  To recap hx, he was admitted 5/22-01/05/16 with acute severe pancreatitis with shock/sepsis, complicated by acute respiratory failure, transaminitis, anemia of critical illness, acute encephalopathy, AKI (Cr 1.36), protein-calorie malnutrition with severe hypoalbuminemia, hypernatremia & hypokalemia. Prior to that admission, he only had mild hypertension requiring small dose of lisinopril. He had presented with markedly elevated blood pressure. CT chest/abd/pelvis was negative for dissection. Dr. Outlaw (GI) raised question of catecholamine effect being a factor in elevated blood pressure. Troponins were negative. Hgb fell into the 9 range (previously 14-17), albumin nadir 1.9. 2D echo 12/25/15: EF 65-70%, grade 1 DD, mildly dilated aortic root. He saw his PCP on 01/12/16 at which time he was reporting that his weight was up 18lbs with generalized edma. Dr. Rankins spoke with Dr. Outlaw who felt his edematous condition was likely from low albumin and high IV fluid use as treatment for pancreatitis. It was felt this would improve with gradual mobilization. He was discharged on metoprolol 75mg BID, clonidine patch, and hydralazine 50mg QID. When we saw him in clinic, he was finding it hard to take the latter QID. He was started on Lasix by PCP and he subsequently lost a significant amount of the volume. We discontinued metoprolol in lieu of carvedilol and attempted to taper hydralazine down. In the interim he's been admitted twice more with  recurrent issues related to his pancreatitis..On June 30th he was admitted with jaundice and TBili >9 and was found to have a new large peripancreatic fluid collection 24 x 14 x 10 cm causing mass effect on the biliary tree.  This was treated with antibiotics and then ERCP with biliary stent placed on 01/31/16 by Dr. Hung and discharged home. He was readmitted 02/07/16 for SIRS with concern for cholangitis versus infected pseudocyst with biliary obstruction, elevated transaminitis. He also had splenic vein thrombosis. GI felt there was possible malfunction of biliary stent due to migration or occlusion. The patient was transferred to [facility] for advanced biliary endoscopy was possible. He underwent repeat EGD with necresectomy done 7/20. He was there until 02/19/16.   Past Medical History:  Diagnosis Date  . AKI (acute kidney injury) (HCC) 12/22/2015  . Anxiety   . Diabetes mellitus (HCC)   . Essential hypertension    started on lisinopril about 45 days prior to pancreatitis  . Obesity   . Pancreatitis, acute 12/22/2015   a. 11/2015 - acute severe pancreatitis with shock/sepsis, complicated by acute respiratory failure, transaminitis, anemia of critical illness, acute encephalopathy, AKI (Cr 1.36), protein-calorie malnutrition with severe hypoalbuminemia, & hypokalemia.    Past Surgical History:  Procedure Laterality Date  . BACK SURGERY    . ERCP N/A 01/31/2016   Procedure: ENDOSCOPIC RETROGRADE CHOLANGIOPANCREATOGRAPHY (ERCP);  Surgeon: Patrick Hung, MD;  Location: [facility] END[facility];  Service: Endoscopy;  Laterality: N/A;    Current Medications: Current Outpatient Prescriptions  Medication Sig Dispense Refill  . acetaminophen (TYLENOL) 325 MG tablet Take 650 mg by mouth every 6 (six) hours as needed for fever.    .   albuterol (PROVENTIL HFA;VENTOLIN HFA) 108 (90 Base) MCG/ACT inhaler Inhale 1-2 puffs into the lungs every 6 (six) hours as needed for wheezing or shortness of breath. 1 Inhaler 0  . blood  glucose meter kit and supplies Dispense based on patient and insurance preference. Use up to four times daily as directed. (FOR ICD-9 250.00, 250.01). 1 each 0  . carvedilol (COREG) 25 MG tablet Take 1 tablet (25 mg total) by mouth 2 (two) times daily. 180 tablet 3  . cetirizine (ZYRTEC) 10 MG tablet Take 10 mg by mouth at bedtime.    . fluticasone (FLONASE) 50 MCG/ACT nasal spray Place 1 spray into both nostrils at bedtime.    . guaiFENesin-codeine 100-10 MG/5ML syrup Take 5 mLs by mouth at bedtime as needed for cough. 120 mL 0  . HYDROmorphone (DILAUDID) 1 MG/ML injection Inject 1 mL (1 mg total) into the vein every 4 (four) hours as needed for severe pain. 1 mL 0  . imipenem-cilastatin 500 mg in sodium chloride 0.9 % 100 mL Inject 500 mg into the vein every 6 (six) hours.    . insulin aspart (NOVOLOG) 100 UNIT/ML FlexPen 8 units with meals (as long as eating 50%) PLUS the following scale: CBG 70 - 120: 0 units CBG 121 - 150: 2 units CBG 151 - 200: 3 units CBG 201 - 250: 5 units CBG 251 - 300: 8 units CBG 301 - 350: 11 units CBG 351 - 400: 15 units (Patient taking differently: Inject 8 Units into the skin 3 (three) times daily with meals. 8 units with meals (as long as eating 50%) PLUS the following scale: CBG 70 - 120: 0 units CBG 121 - 150: 2 units CBG 151 - 200: 3 units CBG 201 - 250: 5 units CBG 251 - 300: 8 units CBG 301 - 350: 11 units CBG 351 - 400: 15 units) 60 mL 11  . Insulin Glargine (LANTUS SOLOSTAR) 100 UNIT/ML Solostar Pen Inject 25 Units into the skin daily at 10 pm. 15 mL 11  . Insulin Pen Needle 31G X 5 MM MISC For insulin injections 100 each 1  . pantoprazole (PROTONIX) 40 MG tablet Take 1 tablet (40 mg total) by mouth at bedtime. 30 tablet 0   No current facility-administered medications for this visit.      Allergies:   Review of patient's allergies indicates no known allergies.   Social History   Social History  . Marital status: Married    Spouse name: N/A    . Number of children: N/A  . Years of education: N/A   Occupational History  .      Works in administration at [employer]   Social History Main Topics  . Smoking status: Never Smoker  . Smokeless tobacco: Never Used  . Alcohol use 0.0 oz/week     Comment: 1 beer per night previously - now rare use  . Drug use: No  . Sexual activity: Not on file   Other Topics Concern  . Not on file   Social History Narrative  . No narrative on file     Family History:  The patient's family history includes Diabetes in his mother; Hypertension in his father; Schizophrenia in his father. ***  ROS:   Please see the history of present illness. Otherwise, review of systems is positive for ***.  All other systems are reviewed and otherwise negative.    PHYSICAL EXAM:   VS:  There were no vitals taken for this   visit.  BMI: There is no height or weight on file to calculate BMI. GEN: Well nourished, well developed, in no acute distress  HEENT: normocephalic, atraumatic Neck: no JVD, carotid bruits, or masses Cardiac: ***RRR; no murmurs, rubs, or gallops, no edema  Respiratory:  clear to auscultation bilaterally, normal work of breathing GI: soft, nontender, nondistended, + BS MS: no deformity or atrophy  Skin: warm and dry, no rash Neuro:  Alert and Oriented x 3, Strength and sensation are intact, follows commands Psych: euthymic mood, full affect  Wt Readings from Last 3 Encounters:  02/08/16 292 lb 5.3 oz (132.6 kg)  01/29/16 263 lb 7.2 oz (119.5 kg)  01/21/16 276 lb (125.2 kg)      Studies/Labs Reviewed:   EKG:  EKG was ordered today and personally reviewed by me and demonstrates *** EKG was not ordered today.***  Recent Labs: 12/24/2015: B Natriuretic Peptide 151.9 01/03/2016: Magnesium 1.9 02/09/2016: ALT 146; BUN 5; Creatinine, Ser 0.89; Hemoglobin 8.7; Platelets 518; Potassium 4.2; Sodium 133   Lipid Panel    Component Value Date/Time   CHOL 187 12/22/2015 0500    TRIG 63 12/22/2015 0500   HDL 41 12/22/2015 0500   CHOLHDL 4.6 12/22/2015 0500   VLDL 13 12/22/2015 0500   LDLCALC 133 (H) 12/22/2015 0500    Additional studies/ records that were reviewed today include: Summarized above.***    ASSESSMENT & PLAN:   1. Hypertension 2. Bilateral lower extremity edema 3. Morbid obesity - once recovered from illness, would encourage regular physical activity which would aid in BP control as well. 4. Recent severe pancreatitis with numerous complications including acute kidney injury, anemia  Disposition: F/u with ***   Medication Adjustments/Labs and Tests Ordered: Current medicines are reviewed at length with the patient today.  Concerns regarding medicines are outlined above. Medication changes, Labs and Tests ordered today are summarized above and listed in the Patient Instructions accessible in Encounters.   Raechel Ache PA-C  02/25/2016 4:50 PM    [facility] [place], [place], [place]  [address] Phone: [phone]; Fax: [phone]

## 2016-02-26 ENCOUNTER — Ambulatory Visit: Payer: Federal, State, Local not specified - PPO | Admitting: Physician Assistant

## 2016-02-26 DIAGNOSIS — K863 Pseudocyst of pancreas: Secondary | ICD-10-CM | POA: Diagnosis not present

## 2016-02-26 DIAGNOSIS — I10 Essential (primary) hypertension: Secondary | ICD-10-CM | POA: Diagnosis not present

## 2016-02-26 DIAGNOSIS — E119 Type 2 diabetes mellitus without complications: Secondary | ICD-10-CM | POA: Diagnosis not present

## 2016-02-26 DIAGNOSIS — K8511 Biliary acute pancreatitis with uninfected necrosis: Secondary | ICD-10-CM | POA: Diagnosis not present

## 2016-02-26 DIAGNOSIS — R1310 Dysphagia, unspecified: Secondary | ICD-10-CM | POA: Diagnosis not present

## 2016-02-26 DIAGNOSIS — A419 Sepsis, unspecified organism: Secondary | ICD-10-CM | POA: Diagnosis not present

## 2016-02-26 DIAGNOSIS — Z8739 Personal history of other diseases of the musculoskeletal system and connective tissue: Secondary | ICD-10-CM | POA: Diagnosis not present

## 2016-02-26 DIAGNOSIS — R05 Cough: Secondary | ICD-10-CM | POA: Diagnosis not present

## 2016-02-26 DIAGNOSIS — Z794 Long term (current) use of insulin: Secondary | ICD-10-CM | POA: Diagnosis not present

## 2016-02-26 DIAGNOSIS — K838 Other specified diseases of biliary tract: Secondary | ICD-10-CM | POA: Diagnosis not present

## 2016-02-26 DIAGNOSIS — R5383 Other fatigue: Secondary | ICD-10-CM | POA: Diagnosis not present

## 2016-02-26 DIAGNOSIS — K5669 Other intestinal obstruction: Secondary | ICD-10-CM | POA: Diagnosis not present

## 2016-02-26 DIAGNOSIS — B379 Candidiasis, unspecified: Secondary | ICD-10-CM | POA: Diagnosis not present

## 2016-02-26 DIAGNOSIS — E669 Obesity, unspecified: Secondary | ICD-10-CM | POA: Diagnosis not present

## 2016-02-26 DIAGNOSIS — J90 Pleural effusion, not elsewhere classified: Secondary | ICD-10-CM | POA: Diagnosis not present

## 2016-02-26 DIAGNOSIS — Z9889 Other specified postprocedural states: Secondary | ICD-10-CM | POA: Diagnosis not present

## 2016-02-26 DIAGNOSIS — R6510 Systemic inflammatory response syndrome (SIRS) of non-infectious origin without acute organ dysfunction: Secondary | ICD-10-CM | POA: Diagnosis not present

## 2016-02-26 DIAGNOSIS — R509 Fever, unspecified: Secondary | ICD-10-CM | POA: Diagnosis not present

## 2016-02-27 DIAGNOSIS — R6510 Systemic inflammatory response syndrome (SIRS) of non-infectious origin without acute organ dysfunction: Secondary | ICD-10-CM | POA: Diagnosis not present

## 2016-02-27 DIAGNOSIS — K8511 Biliary acute pancreatitis with uninfected necrosis: Secondary | ICD-10-CM | POA: Diagnosis not present

## 2016-02-28 DIAGNOSIS — K8010 Calculus of gallbladder with chronic cholecystitis without obstruction: Secondary | ICD-10-CM | POA: Diagnosis not present

## 2016-02-28 DIAGNOSIS — I82890 Acute embolism and thrombosis of other specified veins: Secondary | ICD-10-CM | POA: Diagnosis not present

## 2016-02-28 DIAGNOSIS — I952 Hypotension due to drugs: Secondary | ICD-10-CM | POA: Diagnosis not present

## 2016-02-28 DIAGNOSIS — R188 Other ascites: Secondary | ICD-10-CM | POA: Diagnosis not present

## 2016-02-28 DIAGNOSIS — Z6835 Body mass index (BMI) 35.0-35.9, adult: Secondary | ICD-10-CM | POA: Diagnosis not present

## 2016-02-28 DIAGNOSIS — B379 Candidiasis, unspecified: Secondary | ICD-10-CM | POA: Diagnosis not present

## 2016-02-28 DIAGNOSIS — Z792 Long term (current) use of antibiotics: Secondary | ICD-10-CM | POA: Diagnosis not present

## 2016-02-28 DIAGNOSIS — A419 Sepsis, unspecified organism: Secondary | ICD-10-CM | POA: Diagnosis not present

## 2016-02-28 DIAGNOSIS — K831 Obstruction of bile duct: Secondary | ICD-10-CM | POA: Diagnosis not present

## 2016-02-28 DIAGNOSIS — K8592 Acute pancreatitis with infected necrosis, unspecified: Secondary | ICD-10-CM | POA: Diagnosis not present

## 2016-02-28 DIAGNOSIS — B9689 Other specified bacterial agents as the cause of diseases classified elsewhere: Secondary | ICD-10-CM | POA: Diagnosis not present

## 2016-02-28 DIAGNOSIS — Z452 Encounter for adjustment and management of vascular access device: Secondary | ICD-10-CM | POA: Diagnosis not present

## 2016-02-28 DIAGNOSIS — E1169 Type 2 diabetes mellitus with other specified complication: Secondary | ICD-10-CM | POA: Diagnosis not present

## 2016-02-28 DIAGNOSIS — F411 Generalized anxiety disorder: Secondary | ICD-10-CM | POA: Diagnosis not present

## 2016-02-28 DIAGNOSIS — I10 Essential (primary) hypertension: Secondary | ICD-10-CM | POA: Diagnosis not present

## 2016-02-28 DIAGNOSIS — K5669 Other intestinal obstruction: Secondary | ICD-10-CM | POA: Diagnosis not present

## 2016-02-28 DIAGNOSIS — R509 Fever, unspecified: Secondary | ICD-10-CM | POA: Diagnosis not present

## 2016-02-28 DIAGNOSIS — Z87898 Personal history of other specified conditions: Secondary | ICD-10-CM | POA: Diagnosis not present

## 2016-02-28 DIAGNOSIS — R5383 Other fatigue: Secondary | ICD-10-CM | POA: Diagnosis not present

## 2016-02-28 DIAGNOSIS — E43 Unspecified severe protein-calorie malnutrition: Secondary | ICD-10-CM | POA: Diagnosis not present

## 2016-02-28 DIAGNOSIS — R0682 Tachypnea, not elsewhere classified: Secondary | ICD-10-CM | POA: Diagnosis not present

## 2016-02-28 DIAGNOSIS — A4189 Other specified sepsis: Secondary | ICD-10-CM | POA: Diagnosis not present

## 2016-02-28 DIAGNOSIS — Z6839 Body mass index (BMI) 39.0-39.9, adult: Secondary | ICD-10-CM | POA: Diagnosis not present

## 2016-02-28 DIAGNOSIS — N179 Acute kidney failure, unspecified: Secondary | ICD-10-CM | POA: Diagnosis not present

## 2016-02-28 DIAGNOSIS — G8918 Other acute postprocedural pain: Secondary | ICD-10-CM | POA: Diagnosis not present

## 2016-02-28 DIAGNOSIS — K851 Biliary acute pancreatitis without necrosis or infection: Secondary | ICD-10-CM | POA: Diagnosis not present

## 2016-02-28 DIAGNOSIS — R05 Cough: Secondary | ICD-10-CM | POA: Diagnosis not present

## 2016-02-28 DIAGNOSIS — Z4682 Encounter for fitting and adjustment of non-vascular catheter: Secondary | ICD-10-CM | POA: Diagnosis not present

## 2016-02-28 DIAGNOSIS — E119 Type 2 diabetes mellitus without complications: Secondary | ICD-10-CM | POA: Diagnosis not present

## 2016-02-28 DIAGNOSIS — K859 Acute pancreatitis without necrosis or infection, unspecified: Secondary | ICD-10-CM | POA: Diagnosis not present

## 2016-02-28 DIAGNOSIS — E669 Obesity, unspecified: Secondary | ICD-10-CM | POA: Diagnosis not present

## 2016-02-28 DIAGNOSIS — B952 Enterococcus as the cause of diseases classified elsewhere: Secondary | ICD-10-CM | POA: Diagnosis not present

## 2016-02-28 DIAGNOSIS — B3781 Candidal esophagitis: Secondary | ICD-10-CM | POA: Diagnosis not present

## 2016-02-28 DIAGNOSIS — R600 Localized edema: Secondary | ICD-10-CM | POA: Diagnosis not present

## 2016-02-28 DIAGNOSIS — E871 Hypo-osmolality and hyponatremia: Secondary | ICD-10-CM | POA: Diagnosis not present

## 2016-02-28 DIAGNOSIS — D649 Anemia, unspecified: Secondary | ICD-10-CM | POA: Diagnosis not present

## 2016-02-28 DIAGNOSIS — R918 Other nonspecific abnormal finding of lung field: Secondary | ICD-10-CM | POA: Diagnosis not present

## 2016-02-28 DIAGNOSIS — K819 Cholecystitis, unspecified: Secondary | ICD-10-CM | POA: Diagnosis not present

## 2016-02-28 DIAGNOSIS — K8591 Acute pancreatitis with uninfected necrosis, unspecified: Secondary | ICD-10-CM | POA: Diagnosis not present

## 2016-02-28 DIAGNOSIS — E875 Hyperkalemia: Secondary | ICD-10-CM | POA: Diagnosis not present

## 2016-02-28 DIAGNOSIS — E878 Other disorders of electrolyte and fluid balance, not elsewhere classified: Secondary | ICD-10-CM | POA: Diagnosis not present

## 2016-02-28 DIAGNOSIS — J9811 Atelectasis: Secondary | ICD-10-CM | POA: Diagnosis not present

## 2016-02-28 DIAGNOSIS — K8512 Biliary acute pancreatitis with infected necrosis: Secondary | ICD-10-CM | POA: Diagnosis not present

## 2016-02-28 DIAGNOSIS — E1165 Type 2 diabetes mellitus with hyperglycemia: Secondary | ICD-10-CM | POA: Diagnosis not present

## 2016-02-28 DIAGNOSIS — R0602 Shortness of breath: Secondary | ICD-10-CM | POA: Diagnosis not present

## 2016-02-28 DIAGNOSIS — J90 Pleural effusion, not elsewhere classified: Secondary | ICD-10-CM | POA: Diagnosis not present

## 2016-02-28 DIAGNOSIS — Z794 Long term (current) use of insulin: Secondary | ICD-10-CM | POA: Diagnosis not present

## 2016-02-29 DIAGNOSIS — R05 Cough: Secondary | ICD-10-CM | POA: Diagnosis not present

## 2016-02-29 DIAGNOSIS — K8591 Acute pancreatitis with uninfected necrosis, unspecified: Secondary | ICD-10-CM | POA: Diagnosis not present

## 2016-02-29 DIAGNOSIS — Z794 Long term (current) use of insulin: Secondary | ICD-10-CM | POA: Diagnosis not present

## 2016-02-29 DIAGNOSIS — R509 Fever, unspecified: Secondary | ICD-10-CM | POA: Diagnosis not present

## 2016-02-29 DIAGNOSIS — I10 Essential (primary) hypertension: Secondary | ICD-10-CM | POA: Diagnosis not present

## 2016-02-29 DIAGNOSIS — E119 Type 2 diabetes mellitus without complications: Secondary | ICD-10-CM | POA: Diagnosis not present

## 2016-03-01 DIAGNOSIS — K8591 Acute pancreatitis with uninfected necrosis, unspecified: Secondary | ICD-10-CM | POA: Diagnosis not present

## 2016-03-01 DIAGNOSIS — Z794 Long term (current) use of insulin: Secondary | ICD-10-CM | POA: Diagnosis not present

## 2016-03-01 DIAGNOSIS — I10 Essential (primary) hypertension: Secondary | ICD-10-CM | POA: Diagnosis not present

## 2016-03-01 DIAGNOSIS — F411 Generalized anxiety disorder: Secondary | ICD-10-CM | POA: Diagnosis not present

## 2016-03-01 DIAGNOSIS — R509 Fever, unspecified: Secondary | ICD-10-CM | POA: Diagnosis not present

## 2016-03-01 DIAGNOSIS — A419 Sepsis, unspecified organism: Secondary | ICD-10-CM | POA: Diagnosis not present

## 2016-03-01 DIAGNOSIS — E119 Type 2 diabetes mellitus without complications: Secondary | ICD-10-CM | POA: Diagnosis not present

## 2016-03-02 DIAGNOSIS — E119 Type 2 diabetes mellitus without complications: Secondary | ICD-10-CM | POA: Diagnosis not present

## 2016-03-02 DIAGNOSIS — I10 Essential (primary) hypertension: Secondary | ICD-10-CM | POA: Diagnosis not present

## 2016-03-02 DIAGNOSIS — A419 Sepsis, unspecified organism: Secondary | ICD-10-CM | POA: Diagnosis not present

## 2016-03-02 DIAGNOSIS — Z794 Long term (current) use of insulin: Secondary | ICD-10-CM | POA: Diagnosis not present

## 2016-03-03 DIAGNOSIS — A419 Sepsis, unspecified organism: Secondary | ICD-10-CM | POA: Diagnosis not present

## 2016-03-03 DIAGNOSIS — R509 Fever, unspecified: Secondary | ICD-10-CM | POA: Diagnosis not present

## 2016-03-03 DIAGNOSIS — I10 Essential (primary) hypertension: Secondary | ICD-10-CM | POA: Diagnosis not present

## 2016-03-03 DIAGNOSIS — Z794 Long term (current) use of insulin: Secondary | ICD-10-CM | POA: Diagnosis not present

## 2016-03-03 DIAGNOSIS — E119 Type 2 diabetes mellitus without complications: Secondary | ICD-10-CM | POA: Diagnosis not present

## 2016-03-04 DIAGNOSIS — I10 Essential (primary) hypertension: Secondary | ICD-10-CM | POA: Diagnosis not present

## 2016-03-04 DIAGNOSIS — E119 Type 2 diabetes mellitus without complications: Secondary | ICD-10-CM | POA: Diagnosis not present

## 2016-03-04 DIAGNOSIS — A4189 Other specified sepsis: Secondary | ICD-10-CM | POA: Diagnosis not present

## 2016-03-04 DIAGNOSIS — Z794 Long term (current) use of insulin: Secondary | ICD-10-CM | POA: Diagnosis not present

## 2016-03-04 DIAGNOSIS — B379 Candidiasis, unspecified: Secondary | ICD-10-CM | POA: Diagnosis not present

## 2016-03-04 DIAGNOSIS — B952 Enterococcus as the cause of diseases classified elsewhere: Secondary | ICD-10-CM | POA: Diagnosis not present

## 2016-03-04 DIAGNOSIS — A419 Sepsis, unspecified organism: Secondary | ICD-10-CM | POA: Diagnosis not present

## 2016-03-04 DIAGNOSIS — K8591 Acute pancreatitis with uninfected necrosis, unspecified: Secondary | ICD-10-CM | POA: Diagnosis not present

## 2016-03-04 DIAGNOSIS — K8592 Acute pancreatitis with infected necrosis, unspecified: Secondary | ICD-10-CM | POA: Diagnosis not present

## 2016-03-05 DIAGNOSIS — K8592 Acute pancreatitis with infected necrosis, unspecified: Secondary | ICD-10-CM | POA: Diagnosis not present

## 2016-03-05 DIAGNOSIS — Z794 Long term (current) use of insulin: Secondary | ICD-10-CM | POA: Diagnosis not present

## 2016-03-05 DIAGNOSIS — I10 Essential (primary) hypertension: Secondary | ICD-10-CM | POA: Diagnosis not present

## 2016-03-05 DIAGNOSIS — B952 Enterococcus as the cause of diseases classified elsewhere: Secondary | ICD-10-CM | POA: Diagnosis not present

## 2016-03-05 DIAGNOSIS — A4189 Other specified sepsis: Secondary | ICD-10-CM | POA: Diagnosis not present

## 2016-03-05 DIAGNOSIS — E119 Type 2 diabetes mellitus without complications: Secondary | ICD-10-CM | POA: Diagnosis not present

## 2016-03-05 DIAGNOSIS — A419 Sepsis, unspecified organism: Secondary | ICD-10-CM | POA: Diagnosis not present

## 2016-03-05 DIAGNOSIS — B379 Candidiasis, unspecified: Secondary | ICD-10-CM | POA: Diagnosis not present

## 2016-03-06 DIAGNOSIS — B952 Enterococcus as the cause of diseases classified elsewhere: Secondary | ICD-10-CM | POA: Diagnosis not present

## 2016-03-06 DIAGNOSIS — I10 Essential (primary) hypertension: Secondary | ICD-10-CM | POA: Diagnosis not present

## 2016-03-06 DIAGNOSIS — K8592 Acute pancreatitis with infected necrosis, unspecified: Secondary | ICD-10-CM | POA: Diagnosis not present

## 2016-03-06 DIAGNOSIS — B379 Candidiasis, unspecified: Secondary | ICD-10-CM | POA: Diagnosis not present

## 2016-03-06 DIAGNOSIS — A419 Sepsis, unspecified organism: Secondary | ICD-10-CM | POA: Diagnosis not present

## 2016-03-06 DIAGNOSIS — E119 Type 2 diabetes mellitus without complications: Secondary | ICD-10-CM | POA: Diagnosis not present

## 2016-03-06 DIAGNOSIS — A4189 Other specified sepsis: Secondary | ICD-10-CM | POA: Diagnosis not present

## 2016-03-06 DIAGNOSIS — Z794 Long term (current) use of insulin: Secondary | ICD-10-CM | POA: Diagnosis not present

## 2016-03-07 DIAGNOSIS — B379 Candidiasis, unspecified: Secondary | ICD-10-CM | POA: Diagnosis not present

## 2016-03-07 DIAGNOSIS — B952 Enterococcus as the cause of diseases classified elsewhere: Secondary | ICD-10-CM | POA: Diagnosis not present

## 2016-03-07 DIAGNOSIS — K8591 Acute pancreatitis with uninfected necrosis, unspecified: Secondary | ICD-10-CM | POA: Diagnosis not present

## 2016-03-07 DIAGNOSIS — K8592 Acute pancreatitis with infected necrosis, unspecified: Secondary | ICD-10-CM | POA: Diagnosis not present

## 2016-03-07 DIAGNOSIS — B9689 Other specified bacterial agents as the cause of diseases classified elsewhere: Secondary | ICD-10-CM | POA: Diagnosis not present

## 2016-03-08 DIAGNOSIS — B9689 Other specified bacterial agents as the cause of diseases classified elsewhere: Secondary | ICD-10-CM | POA: Diagnosis not present

## 2016-03-08 DIAGNOSIS — B952 Enterococcus as the cause of diseases classified elsewhere: Secondary | ICD-10-CM | POA: Diagnosis not present

## 2016-03-08 DIAGNOSIS — K8592 Acute pancreatitis with infected necrosis, unspecified: Secondary | ICD-10-CM | POA: Diagnosis not present

## 2016-03-08 DIAGNOSIS — B379 Candidiasis, unspecified: Secondary | ICD-10-CM | POA: Diagnosis not present

## 2016-03-09 DIAGNOSIS — K859 Acute pancreatitis without necrosis or infection, unspecified: Secondary | ICD-10-CM | POA: Diagnosis not present

## 2016-03-09 DIAGNOSIS — I82890 Acute embolism and thrombosis of other specified veins: Secondary | ICD-10-CM | POA: Diagnosis not present

## 2016-03-09 DIAGNOSIS — R0682 Tachypnea, not elsewhere classified: Secondary | ICD-10-CM | POA: Diagnosis not present

## 2016-03-09 DIAGNOSIS — B952 Enterococcus as the cause of diseases classified elsewhere: Secondary | ICD-10-CM | POA: Diagnosis not present

## 2016-03-09 DIAGNOSIS — K8592 Acute pancreatitis with infected necrosis, unspecified: Secondary | ICD-10-CM | POA: Diagnosis not present

## 2016-03-09 DIAGNOSIS — B9689 Other specified bacterial agents as the cause of diseases classified elsewhere: Secondary | ICD-10-CM | POA: Diagnosis not present

## 2016-03-09 DIAGNOSIS — B379 Candidiasis, unspecified: Secondary | ICD-10-CM | POA: Diagnosis not present

## 2016-03-09 DIAGNOSIS — K8591 Acute pancreatitis with uninfected necrosis, unspecified: Secondary | ICD-10-CM | POA: Diagnosis not present

## 2016-03-10 DIAGNOSIS — B379 Candidiasis, unspecified: Secondary | ICD-10-CM | POA: Diagnosis not present

## 2016-03-10 DIAGNOSIS — E119 Type 2 diabetes mellitus without complications: Secondary | ICD-10-CM | POA: Diagnosis not present

## 2016-03-10 DIAGNOSIS — F411 Generalized anxiety disorder: Secondary | ICD-10-CM | POA: Diagnosis not present

## 2016-03-10 DIAGNOSIS — I10 Essential (primary) hypertension: Secondary | ICD-10-CM | POA: Diagnosis not present

## 2016-03-10 DIAGNOSIS — K8591 Acute pancreatitis with uninfected necrosis, unspecified: Secondary | ICD-10-CM | POA: Diagnosis not present

## 2016-03-10 DIAGNOSIS — B952 Enterococcus as the cause of diseases classified elsewhere: Secondary | ICD-10-CM | POA: Diagnosis not present

## 2016-03-10 DIAGNOSIS — B9689 Other specified bacterial agents as the cause of diseases classified elsewhere: Secondary | ICD-10-CM | POA: Diagnosis not present

## 2016-03-10 DIAGNOSIS — K8592 Acute pancreatitis with infected necrosis, unspecified: Secondary | ICD-10-CM | POA: Diagnosis not present

## 2016-03-10 DIAGNOSIS — B3781 Candidal esophagitis: Secondary | ICD-10-CM | POA: Diagnosis not present

## 2016-03-11 DIAGNOSIS — K8591 Acute pancreatitis with uninfected necrosis, unspecified: Secondary | ICD-10-CM | POA: Diagnosis not present

## 2016-03-11 DIAGNOSIS — E119 Type 2 diabetes mellitus without complications: Secondary | ICD-10-CM | POA: Diagnosis not present

## 2016-03-11 DIAGNOSIS — K831 Obstruction of bile duct: Secondary | ICD-10-CM | POA: Diagnosis not present

## 2016-03-11 DIAGNOSIS — B952 Enterococcus as the cause of diseases classified elsewhere: Secondary | ICD-10-CM | POA: Diagnosis not present

## 2016-03-11 DIAGNOSIS — B379 Candidiasis, unspecified: Secondary | ICD-10-CM | POA: Diagnosis not present

## 2016-03-11 DIAGNOSIS — K8512 Biliary acute pancreatitis with infected necrosis: Secondary | ICD-10-CM | POA: Diagnosis not present

## 2016-03-11 DIAGNOSIS — B9689 Other specified bacterial agents as the cause of diseases classified elsewhere: Secondary | ICD-10-CM | POA: Diagnosis not present

## 2016-03-12 DIAGNOSIS — K831 Obstruction of bile duct: Secondary | ICD-10-CM | POA: Diagnosis not present

## 2016-03-12 DIAGNOSIS — J90 Pleural effusion, not elsewhere classified: Secondary | ICD-10-CM | POA: Diagnosis not present

## 2016-03-12 DIAGNOSIS — K8591 Acute pancreatitis with uninfected necrosis, unspecified: Secondary | ICD-10-CM | POA: Diagnosis not present

## 2016-03-12 DIAGNOSIS — J9811 Atelectasis: Secondary | ICD-10-CM | POA: Diagnosis not present

## 2016-03-12 DIAGNOSIS — E119 Type 2 diabetes mellitus without complications: Secondary | ICD-10-CM | POA: Diagnosis not present

## 2016-03-12 DIAGNOSIS — B379 Candidiasis, unspecified: Secondary | ICD-10-CM | POA: Diagnosis not present

## 2016-03-12 DIAGNOSIS — B952 Enterococcus as the cause of diseases classified elsewhere: Secondary | ICD-10-CM | POA: Diagnosis not present

## 2016-03-12 DIAGNOSIS — Z792 Long term (current) use of antibiotics: Secondary | ICD-10-CM | POA: Diagnosis not present

## 2016-03-13 DIAGNOSIS — B952 Enterococcus as the cause of diseases classified elsewhere: Secondary | ICD-10-CM | POA: Diagnosis not present

## 2016-03-13 DIAGNOSIS — K8591 Acute pancreatitis with uninfected necrosis, unspecified: Secondary | ICD-10-CM | POA: Diagnosis not present

## 2016-03-13 DIAGNOSIS — E119 Type 2 diabetes mellitus without complications: Secondary | ICD-10-CM | POA: Diagnosis not present

## 2016-03-13 DIAGNOSIS — K8592 Acute pancreatitis with infected necrosis, unspecified: Secondary | ICD-10-CM | POA: Diagnosis not present

## 2016-03-13 DIAGNOSIS — J90 Pleural effusion, not elsewhere classified: Secondary | ICD-10-CM | POA: Diagnosis not present

## 2016-03-13 DIAGNOSIS — B379 Candidiasis, unspecified: Secondary | ICD-10-CM | POA: Diagnosis not present

## 2016-03-14 DIAGNOSIS — R509 Fever, unspecified: Secondary | ICD-10-CM | POA: Diagnosis not present

## 2016-03-14 DIAGNOSIS — B379 Candidiasis, unspecified: Secondary | ICD-10-CM | POA: Diagnosis not present

## 2016-03-14 DIAGNOSIS — E119 Type 2 diabetes mellitus without complications: Secondary | ICD-10-CM | POA: Diagnosis not present

## 2016-03-14 DIAGNOSIS — B952 Enterococcus as the cause of diseases classified elsewhere: Secondary | ICD-10-CM | POA: Diagnosis not present

## 2016-03-14 DIAGNOSIS — K8591 Acute pancreatitis with uninfected necrosis, unspecified: Secondary | ICD-10-CM | POA: Diagnosis not present

## 2016-03-14 DIAGNOSIS — Z4682 Encounter for fitting and adjustment of non-vascular catheter: Secondary | ICD-10-CM | POA: Diagnosis not present

## 2016-03-14 DIAGNOSIS — F411 Generalized anxiety disorder: Secondary | ICD-10-CM | POA: Diagnosis not present

## 2016-03-15 DIAGNOSIS — B952 Enterococcus as the cause of diseases classified elsewhere: Secondary | ICD-10-CM | POA: Diagnosis not present

## 2016-03-15 DIAGNOSIS — D649 Anemia, unspecified: Secondary | ICD-10-CM | POA: Diagnosis not present

## 2016-03-15 DIAGNOSIS — K8591 Acute pancreatitis with uninfected necrosis, unspecified: Secondary | ICD-10-CM | POA: Diagnosis not present

## 2016-03-15 DIAGNOSIS — B379 Candidiasis, unspecified: Secondary | ICD-10-CM | POA: Diagnosis not present

## 2016-03-15 DIAGNOSIS — E119 Type 2 diabetes mellitus without complications: Secondary | ICD-10-CM | POA: Diagnosis not present

## 2016-03-15 DIAGNOSIS — K8592 Acute pancreatitis with infected necrosis, unspecified: Secondary | ICD-10-CM | POA: Diagnosis not present

## 2016-03-16 DIAGNOSIS — B952 Enterococcus as the cause of diseases classified elsewhere: Secondary | ICD-10-CM | POA: Diagnosis not present

## 2016-03-16 DIAGNOSIS — D649 Anemia, unspecified: Secondary | ICD-10-CM | POA: Diagnosis not present

## 2016-03-16 DIAGNOSIS — K8591 Acute pancreatitis with uninfected necrosis, unspecified: Secondary | ICD-10-CM | POA: Diagnosis not present

## 2016-03-16 DIAGNOSIS — K8592 Acute pancreatitis with infected necrosis, unspecified: Secondary | ICD-10-CM | POA: Diagnosis not present

## 2016-03-16 DIAGNOSIS — B379 Candidiasis, unspecified: Secondary | ICD-10-CM | POA: Diagnosis not present

## 2016-03-17 DIAGNOSIS — D649 Anemia, unspecified: Secondary | ICD-10-CM | POA: Diagnosis not present

## 2016-03-17 DIAGNOSIS — N179 Acute kidney failure, unspecified: Secondary | ICD-10-CM | POA: Diagnosis not present

## 2016-03-17 DIAGNOSIS — K8591 Acute pancreatitis with uninfected necrosis, unspecified: Secondary | ICD-10-CM | POA: Diagnosis not present

## 2016-03-17 DIAGNOSIS — E119 Type 2 diabetes mellitus without complications: Secondary | ICD-10-CM | POA: Diagnosis not present

## 2016-03-18 DIAGNOSIS — K8592 Acute pancreatitis with infected necrosis, unspecified: Secondary | ICD-10-CM | POA: Diagnosis not present

## 2016-03-18 DIAGNOSIS — K8010 Calculus of gallbladder with chronic cholecystitis without obstruction: Secondary | ICD-10-CM | POA: Diagnosis not present

## 2016-03-18 DIAGNOSIS — E119 Type 2 diabetes mellitus without complications: Secondary | ICD-10-CM | POA: Diagnosis not present

## 2016-03-18 DIAGNOSIS — Z87898 Personal history of other specified conditions: Secondary | ICD-10-CM | POA: Diagnosis not present

## 2016-03-18 DIAGNOSIS — K851 Biliary acute pancreatitis without necrosis or infection: Secondary | ICD-10-CM | POA: Diagnosis not present

## 2016-03-18 DIAGNOSIS — D649 Anemia, unspecified: Secondary | ICD-10-CM | POA: Diagnosis not present

## 2016-03-18 DIAGNOSIS — Z6839 Body mass index (BMI) 39.0-39.9, adult: Secondary | ICD-10-CM | POA: Diagnosis not present

## 2016-03-18 DIAGNOSIS — K8591 Acute pancreatitis with uninfected necrosis, unspecified: Secondary | ICD-10-CM | POA: Diagnosis not present

## 2016-03-18 DIAGNOSIS — K819 Cholecystitis, unspecified: Secondary | ICD-10-CM | POA: Diagnosis not present

## 2016-03-18 DIAGNOSIS — N179 Acute kidney failure, unspecified: Secondary | ICD-10-CM | POA: Diagnosis not present

## 2016-03-18 DIAGNOSIS — G8918 Other acute postprocedural pain: Secondary | ICD-10-CM | POA: Diagnosis not present

## 2016-03-19 DIAGNOSIS — R509 Fever, unspecified: Secondary | ICD-10-CM | POA: Diagnosis not present

## 2016-03-19 DIAGNOSIS — I10 Essential (primary) hypertension: Secondary | ICD-10-CM | POA: Diagnosis not present

## 2016-03-19 DIAGNOSIS — J90 Pleural effusion, not elsewhere classified: Secondary | ICD-10-CM | POA: Diagnosis not present

## 2016-03-19 DIAGNOSIS — R918 Other nonspecific abnormal finding of lung field: Secondary | ICD-10-CM | POA: Diagnosis not present

## 2016-03-19 DIAGNOSIS — G8918 Other acute postprocedural pain: Secondary | ICD-10-CM | POA: Diagnosis not present

## 2016-03-19 DIAGNOSIS — Z452 Encounter for adjustment and management of vascular access device: Secondary | ICD-10-CM | POA: Diagnosis not present

## 2016-03-19 DIAGNOSIS — K8591 Acute pancreatitis with uninfected necrosis, unspecified: Secondary | ICD-10-CM | POA: Diagnosis not present

## 2016-03-19 DIAGNOSIS — E43 Unspecified severe protein-calorie malnutrition: Secondary | ICD-10-CM | POA: Diagnosis not present

## 2016-03-20 DIAGNOSIS — E878 Other disorders of electrolyte and fluid balance, not elsewhere classified: Secondary | ICD-10-CM | POA: Diagnosis not present

## 2016-03-20 DIAGNOSIS — I10 Essential (primary) hypertension: Secondary | ICD-10-CM | POA: Diagnosis not present

## 2016-03-20 DIAGNOSIS — E43 Unspecified severe protein-calorie malnutrition: Secondary | ICD-10-CM | POA: Diagnosis not present

## 2016-03-20 DIAGNOSIS — E875 Hyperkalemia: Secondary | ICD-10-CM | POA: Diagnosis not present

## 2016-03-20 DIAGNOSIS — G8918 Other acute postprocedural pain: Secondary | ICD-10-CM | POA: Diagnosis not present

## 2016-03-20 DIAGNOSIS — K8591 Acute pancreatitis with uninfected necrosis, unspecified: Secondary | ICD-10-CM | POA: Diagnosis not present

## 2016-03-21 DIAGNOSIS — I10 Essential (primary) hypertension: Secondary | ICD-10-CM | POA: Diagnosis not present

## 2016-03-21 DIAGNOSIS — E43 Unspecified severe protein-calorie malnutrition: Secondary | ICD-10-CM | POA: Diagnosis not present

## 2016-03-21 DIAGNOSIS — K5669 Other intestinal obstruction: Secondary | ICD-10-CM | POA: Diagnosis not present

## 2016-03-21 DIAGNOSIS — Z4682 Encounter for fitting and adjustment of non-vascular catheter: Secondary | ICD-10-CM | POA: Diagnosis not present

## 2016-03-21 DIAGNOSIS — K859 Acute pancreatitis without necrosis or infection, unspecified: Secondary | ICD-10-CM | POA: Diagnosis not present

## 2016-03-23 DIAGNOSIS — R0602 Shortness of breath: Secondary | ICD-10-CM | POA: Diagnosis not present

## 2016-03-24 DIAGNOSIS — J90 Pleural effusion, not elsewhere classified: Secondary | ICD-10-CM | POA: Diagnosis not present

## 2016-03-24 DIAGNOSIS — K8591 Acute pancreatitis with uninfected necrosis, unspecified: Secondary | ICD-10-CM | POA: Diagnosis not present

## 2016-03-24 DIAGNOSIS — R509 Fever, unspecified: Secondary | ICD-10-CM | POA: Diagnosis not present

## 2016-03-25 DIAGNOSIS — F411 Generalized anxiety disorder: Secondary | ICD-10-CM | POA: Diagnosis not present

## 2016-03-30 DIAGNOSIS — Z794 Long term (current) use of insulin: Secondary | ICD-10-CM | POA: Diagnosis not present

## 2016-03-30 DIAGNOSIS — E1165 Type 2 diabetes mellitus with hyperglycemia: Secondary | ICD-10-CM | POA: Diagnosis not present

## 2016-03-31 DIAGNOSIS — E1165 Type 2 diabetes mellitus with hyperglycemia: Secondary | ICD-10-CM | POA: Diagnosis not present

## 2016-04-01 DIAGNOSIS — E1165 Type 2 diabetes mellitus with hyperglycemia: Secondary | ICD-10-CM | POA: Diagnosis not present

## 2016-04-02 DIAGNOSIS — K8591 Acute pancreatitis with uninfected necrosis, unspecified: Secondary | ICD-10-CM | POA: Diagnosis not present

## 2016-04-04 DIAGNOSIS — E1165 Type 2 diabetes mellitus with hyperglycemia: Secondary | ICD-10-CM | POA: Diagnosis not present

## 2016-04-04 DIAGNOSIS — Z794 Long term (current) use of insulin: Secondary | ICD-10-CM | POA: Diagnosis not present

## 2016-04-05 DIAGNOSIS — E43 Unspecified severe protein-calorie malnutrition: Secondary | ICD-10-CM | POA: Diagnosis not present

## 2016-04-05 DIAGNOSIS — K8591 Acute pancreatitis with uninfected necrosis, unspecified: Secondary | ICD-10-CM | POA: Diagnosis not present

## 2016-04-05 DIAGNOSIS — E1169 Type 2 diabetes mellitus with other specified complication: Secondary | ICD-10-CM | POA: Diagnosis not present

## 2016-04-05 DIAGNOSIS — E1165 Type 2 diabetes mellitus with hyperglycemia: Secondary | ICD-10-CM | POA: Diagnosis not present

## 2016-04-05 DIAGNOSIS — Z794 Long term (current) use of insulin: Secondary | ICD-10-CM | POA: Diagnosis not present

## 2016-04-07 DIAGNOSIS — E669 Obesity, unspecified: Secondary | ICD-10-CM | POA: Diagnosis not present

## 2016-04-07 DIAGNOSIS — T888 Other specified complications of surgical and medical care, not elsewhere classified: Secondary | ICD-10-CM | POA: Diagnosis not present

## 2016-04-07 DIAGNOSIS — Z431 Encounter for attention to gastrostomy: Secondary | ICD-10-CM | POA: Diagnosis not present

## 2016-04-07 DIAGNOSIS — E119 Type 2 diabetes mellitus without complications: Secondary | ICD-10-CM | POA: Diagnosis not present

## 2016-04-07 DIAGNOSIS — I10 Essential (primary) hypertension: Secondary | ICD-10-CM | POA: Diagnosis not present

## 2016-04-07 DIAGNOSIS — E43 Unspecified severe protein-calorie malnutrition: Secondary | ICD-10-CM | POA: Diagnosis not present

## 2016-04-07 DIAGNOSIS — Z48815 Encounter for surgical aftercare following surgery on the digestive system: Secondary | ICD-10-CM | POA: Diagnosis not present

## 2016-04-07 DIAGNOSIS — Z794 Long term (current) use of insulin: Secondary | ICD-10-CM | POA: Diagnosis not present

## 2016-04-07 DIAGNOSIS — Z7951 Long term (current) use of inhaled steroids: Secondary | ICD-10-CM | POA: Diagnosis not present

## 2016-04-07 DIAGNOSIS — D649 Anemia, unspecified: Secondary | ICD-10-CM | POA: Diagnosis not present

## 2016-04-08 DIAGNOSIS — Z7951 Long term (current) use of inhaled steroids: Secondary | ICD-10-CM | POA: Diagnosis not present

## 2016-04-08 DIAGNOSIS — T888 Other specified complications of surgical and medical care, not elsewhere classified: Secondary | ICD-10-CM | POA: Diagnosis not present

## 2016-04-08 DIAGNOSIS — Z431 Encounter for attention to gastrostomy: Secondary | ICD-10-CM | POA: Diagnosis not present

## 2016-04-08 DIAGNOSIS — I10 Essential (primary) hypertension: Secondary | ICD-10-CM | POA: Diagnosis not present

## 2016-04-08 DIAGNOSIS — Z794 Long term (current) use of insulin: Secondary | ICD-10-CM | POA: Diagnosis not present

## 2016-04-08 DIAGNOSIS — Z48815 Encounter for surgical aftercare following surgery on the digestive system: Secondary | ICD-10-CM | POA: Diagnosis not present

## 2016-04-08 DIAGNOSIS — E43 Unspecified severe protein-calorie malnutrition: Secondary | ICD-10-CM | POA: Diagnosis not present

## 2016-04-08 DIAGNOSIS — E119 Type 2 diabetes mellitus without complications: Secondary | ICD-10-CM | POA: Diagnosis not present

## 2016-04-08 DIAGNOSIS — D649 Anemia, unspecified: Secondary | ICD-10-CM | POA: Diagnosis not present

## 2016-04-08 DIAGNOSIS — E669 Obesity, unspecified: Secondary | ICD-10-CM | POA: Diagnosis not present

## 2016-04-11 DIAGNOSIS — Z7951 Long term (current) use of inhaled steroids: Secondary | ICD-10-CM | POA: Diagnosis not present

## 2016-04-11 DIAGNOSIS — T888 Other specified complications of surgical and medical care, not elsewhere classified: Secondary | ICD-10-CM | POA: Diagnosis not present

## 2016-04-11 DIAGNOSIS — Z48815 Encounter for surgical aftercare following surgery on the digestive system: Secondary | ICD-10-CM | POA: Diagnosis not present

## 2016-04-11 DIAGNOSIS — E43 Unspecified severe protein-calorie malnutrition: Secondary | ICD-10-CM | POA: Diagnosis not present

## 2016-04-11 DIAGNOSIS — E669 Obesity, unspecified: Secondary | ICD-10-CM | POA: Diagnosis not present

## 2016-04-11 DIAGNOSIS — I10 Essential (primary) hypertension: Secondary | ICD-10-CM | POA: Diagnosis not present

## 2016-04-11 DIAGNOSIS — Z431 Encounter for attention to gastrostomy: Secondary | ICD-10-CM | POA: Diagnosis not present

## 2016-04-11 DIAGNOSIS — E119 Type 2 diabetes mellitus without complications: Secondary | ICD-10-CM | POA: Diagnosis not present

## 2016-04-11 DIAGNOSIS — D649 Anemia, unspecified: Secondary | ICD-10-CM | POA: Diagnosis not present

## 2016-04-11 DIAGNOSIS — Z794 Long term (current) use of insulin: Secondary | ICD-10-CM | POA: Diagnosis not present

## 2016-04-12 DIAGNOSIS — D649 Anemia, unspecified: Secondary | ICD-10-CM | POA: Diagnosis not present

## 2016-04-12 DIAGNOSIS — Z431 Encounter for attention to gastrostomy: Secondary | ICD-10-CM | POA: Diagnosis not present

## 2016-04-12 DIAGNOSIS — I10 Essential (primary) hypertension: Secondary | ICD-10-CM | POA: Diagnosis not present

## 2016-04-12 DIAGNOSIS — K8591 Acute pancreatitis with uninfected necrosis, unspecified: Secondary | ICD-10-CM | POA: Diagnosis not present

## 2016-04-12 DIAGNOSIS — E43 Unspecified severe protein-calorie malnutrition: Secondary | ICD-10-CM | POA: Diagnosis not present

## 2016-04-12 DIAGNOSIS — T888 Other specified complications of surgical and medical care, not elsewhere classified: Secondary | ICD-10-CM | POA: Diagnosis not present

## 2016-04-12 DIAGNOSIS — Z48815 Encounter for surgical aftercare following surgery on the digestive system: Secondary | ICD-10-CM | POA: Diagnosis not present

## 2016-04-12 DIAGNOSIS — E669 Obesity, unspecified: Secondary | ICD-10-CM | POA: Diagnosis not present

## 2016-04-12 DIAGNOSIS — Z7951 Long term (current) use of inhaled steroids: Secondary | ICD-10-CM | POA: Diagnosis not present

## 2016-04-12 DIAGNOSIS — E119 Type 2 diabetes mellitus without complications: Secondary | ICD-10-CM | POA: Diagnosis not present

## 2016-04-12 DIAGNOSIS — Z794 Long term (current) use of insulin: Secondary | ICD-10-CM | POA: Diagnosis not present

## 2016-04-13 DIAGNOSIS — L0291 Cutaneous abscess, unspecified: Secondary | ICD-10-CM | POA: Diagnosis not present

## 2016-04-13 DIAGNOSIS — Z4803 Encounter for change or removal of drains: Secondary | ICD-10-CM | POA: Diagnosis not present

## 2016-04-13 DIAGNOSIS — K8591 Acute pancreatitis with uninfected necrosis, unspecified: Secondary | ICD-10-CM | POA: Diagnosis not present

## 2016-04-14 DIAGNOSIS — Z48815 Encounter for surgical aftercare following surgery on the digestive system: Secondary | ICD-10-CM | POA: Diagnosis not present

## 2016-04-14 DIAGNOSIS — I10 Essential (primary) hypertension: Secondary | ICD-10-CM | POA: Diagnosis not present

## 2016-04-14 DIAGNOSIS — K8511 Biliary acute pancreatitis with uninfected necrosis: Secondary | ICD-10-CM | POA: Diagnosis not present

## 2016-04-14 DIAGNOSIS — F411 Generalized anxiety disorder: Secondary | ICD-10-CM | POA: Diagnosis not present

## 2016-04-14 DIAGNOSIS — E118 Type 2 diabetes mellitus with unspecified complications: Secondary | ICD-10-CM | POA: Diagnosis not present

## 2016-04-14 DIAGNOSIS — I82890 Acute embolism and thrombosis of other specified veins: Secondary | ICD-10-CM | POA: Diagnosis not present

## 2016-04-14 DIAGNOSIS — Z934 Other artificial openings of gastrointestinal tract status: Secondary | ICD-10-CM | POA: Diagnosis not present

## 2016-04-14 DIAGNOSIS — E42 Marasmic kwashiorkor: Secondary | ICD-10-CM | POA: Diagnosis not present

## 2016-04-15 DIAGNOSIS — I10 Essential (primary) hypertension: Secondary | ICD-10-CM | POA: Diagnosis not present

## 2016-04-15 DIAGNOSIS — E669 Obesity, unspecified: Secondary | ICD-10-CM | POA: Diagnosis not present

## 2016-04-15 DIAGNOSIS — Z431 Encounter for attention to gastrostomy: Secondary | ICD-10-CM | POA: Diagnosis not present

## 2016-04-15 DIAGNOSIS — Z794 Long term (current) use of insulin: Secondary | ICD-10-CM | POA: Diagnosis not present

## 2016-04-15 DIAGNOSIS — E43 Unspecified severe protein-calorie malnutrition: Secondary | ICD-10-CM | POA: Diagnosis not present

## 2016-04-15 DIAGNOSIS — Z48815 Encounter for surgical aftercare following surgery on the digestive system: Secondary | ICD-10-CM | POA: Diagnosis not present

## 2016-04-15 DIAGNOSIS — Z7951 Long term (current) use of inhaled steroids: Secondary | ICD-10-CM | POA: Diagnosis not present

## 2016-04-15 DIAGNOSIS — D649 Anemia, unspecified: Secondary | ICD-10-CM | POA: Diagnosis not present

## 2016-04-15 DIAGNOSIS — E119 Type 2 diabetes mellitus without complications: Secondary | ICD-10-CM | POA: Diagnosis not present

## 2016-04-18 DIAGNOSIS — K8591 Acute pancreatitis with uninfected necrosis, unspecified: Secondary | ICD-10-CM | POA: Diagnosis not present

## 2016-04-18 DIAGNOSIS — Z23 Encounter for immunization: Secondary | ICD-10-CM | POA: Diagnosis not present

## 2016-04-18 DIAGNOSIS — I10 Essential (primary) hypertension: Secondary | ICD-10-CM | POA: Diagnosis not present

## 2016-04-18 DIAGNOSIS — E119 Type 2 diabetes mellitus without complications: Secondary | ICD-10-CM | POA: Diagnosis not present

## 2016-04-18 DIAGNOSIS — R5381 Other malaise: Secondary | ICD-10-CM | POA: Diagnosis not present

## 2016-04-19 DIAGNOSIS — Z48815 Encounter for surgical aftercare following surgery on the digestive system: Secondary | ICD-10-CM | POA: Diagnosis not present

## 2016-04-19 DIAGNOSIS — E119 Type 2 diabetes mellitus without complications: Secondary | ICD-10-CM | POA: Diagnosis not present

## 2016-04-19 DIAGNOSIS — E43 Unspecified severe protein-calorie malnutrition: Secondary | ICD-10-CM | POA: Diagnosis not present

## 2016-04-19 DIAGNOSIS — Z7951 Long term (current) use of inhaled steroids: Secondary | ICD-10-CM | POA: Diagnosis not present

## 2016-04-19 DIAGNOSIS — D649 Anemia, unspecified: Secondary | ICD-10-CM | POA: Diagnosis not present

## 2016-04-19 DIAGNOSIS — I10 Essential (primary) hypertension: Secondary | ICD-10-CM | POA: Diagnosis not present

## 2016-04-19 DIAGNOSIS — E669 Obesity, unspecified: Secondary | ICD-10-CM | POA: Diagnosis not present

## 2016-04-19 DIAGNOSIS — T888 Other specified complications of surgical and medical care, not elsewhere classified: Secondary | ICD-10-CM | POA: Diagnosis not present

## 2016-04-19 DIAGNOSIS — Z431 Encounter for attention to gastrostomy: Secondary | ICD-10-CM | POA: Diagnosis not present

## 2016-04-19 DIAGNOSIS — Z794 Long term (current) use of insulin: Secondary | ICD-10-CM | POA: Diagnosis not present

## 2016-04-21 DIAGNOSIS — D649 Anemia, unspecified: Secondary | ICD-10-CM | POA: Diagnosis not present

## 2016-04-21 DIAGNOSIS — Z794 Long term (current) use of insulin: Secondary | ICD-10-CM | POA: Diagnosis not present

## 2016-04-21 DIAGNOSIS — E119 Type 2 diabetes mellitus without complications: Secondary | ICD-10-CM | POA: Diagnosis not present

## 2016-04-21 DIAGNOSIS — F411 Generalized anxiety disorder: Secondary | ICD-10-CM | POA: Diagnosis not present

## 2016-04-21 DIAGNOSIS — Z48815 Encounter for surgical aftercare following surgery on the digestive system: Secondary | ICD-10-CM | POA: Diagnosis not present

## 2016-04-21 DIAGNOSIS — Z7951 Long term (current) use of inhaled steroids: Secondary | ICD-10-CM | POA: Diagnosis not present

## 2016-04-21 DIAGNOSIS — I10 Essential (primary) hypertension: Secondary | ICD-10-CM | POA: Diagnosis not present

## 2016-04-21 DIAGNOSIS — Z431 Encounter for attention to gastrostomy: Secondary | ICD-10-CM | POA: Diagnosis not present

## 2016-04-21 DIAGNOSIS — E669 Obesity, unspecified: Secondary | ICD-10-CM | POA: Diagnosis not present

## 2016-04-21 DIAGNOSIS — T888 Other specified complications of surgical and medical care, not elsewhere classified: Secondary | ICD-10-CM | POA: Diagnosis not present

## 2016-04-21 DIAGNOSIS — E43 Unspecified severe protein-calorie malnutrition: Secondary | ICD-10-CM | POA: Diagnosis not present

## 2016-04-22 DIAGNOSIS — R188 Other ascites: Secondary | ICD-10-CM | POA: Diagnosis not present

## 2016-04-22 DIAGNOSIS — E43 Unspecified severe protein-calorie malnutrition: Secondary | ICD-10-CM | POA: Diagnosis not present

## 2016-04-22 DIAGNOSIS — K8511 Biliary acute pancreatitis with uninfected necrosis: Secondary | ICD-10-CM | POA: Diagnosis not present

## 2016-04-22 DIAGNOSIS — I82890 Acute embolism and thrombosis of other specified veins: Secondary | ICD-10-CM | POA: Diagnosis not present

## 2016-04-22 DIAGNOSIS — K8591 Acute pancreatitis with uninfected necrosis, unspecified: Secondary | ICD-10-CM | POA: Diagnosis not present

## 2016-04-22 DIAGNOSIS — R509 Fever, unspecified: Secondary | ICD-10-CM | POA: Diagnosis not present

## 2016-04-22 DIAGNOSIS — Z4682 Encounter for fitting and adjustment of non-vascular catheter: Secondary | ICD-10-CM | POA: Diagnosis not present

## 2016-04-26 DIAGNOSIS — I10 Essential (primary) hypertension: Secondary | ICD-10-CM | POA: Diagnosis not present

## 2016-04-26 DIAGNOSIS — Z794 Long term (current) use of insulin: Secondary | ICD-10-CM | POA: Diagnosis not present

## 2016-04-26 DIAGNOSIS — F411 Generalized anxiety disorder: Secondary | ICD-10-CM | POA: Diagnosis not present

## 2016-04-26 DIAGNOSIS — E669 Obesity, unspecified: Secondary | ICD-10-CM | POA: Diagnosis not present

## 2016-04-26 DIAGNOSIS — Z431 Encounter for attention to gastrostomy: Secondary | ICD-10-CM | POA: Diagnosis not present

## 2016-04-26 DIAGNOSIS — D649 Anemia, unspecified: Secondary | ICD-10-CM | POA: Diagnosis not present

## 2016-04-26 DIAGNOSIS — E43 Unspecified severe protein-calorie malnutrition: Secondary | ICD-10-CM | POA: Diagnosis not present

## 2016-04-26 DIAGNOSIS — Z48815 Encounter for surgical aftercare following surgery on the digestive system: Secondary | ICD-10-CM | POA: Diagnosis not present

## 2016-04-26 DIAGNOSIS — E119 Type 2 diabetes mellitus without complications: Secondary | ICD-10-CM | POA: Diagnosis not present

## 2016-04-26 DIAGNOSIS — Z7951 Long term (current) use of inhaled steroids: Secondary | ICD-10-CM | POA: Diagnosis not present

## 2016-04-26 DIAGNOSIS — T888 Other specified complications of surgical and medical care, not elsewhere classified: Secondary | ICD-10-CM | POA: Diagnosis not present

## 2016-04-28 DIAGNOSIS — T888 Other specified complications of surgical and medical care, not elsewhere classified: Secondary | ICD-10-CM | POA: Diagnosis not present

## 2016-04-28 DIAGNOSIS — E669 Obesity, unspecified: Secondary | ICD-10-CM | POA: Diagnosis not present

## 2016-04-28 DIAGNOSIS — Z7951 Long term (current) use of inhaled steroids: Secondary | ICD-10-CM | POA: Diagnosis not present

## 2016-04-28 DIAGNOSIS — E119 Type 2 diabetes mellitus without complications: Secondary | ICD-10-CM | POA: Diagnosis not present

## 2016-04-28 DIAGNOSIS — D649 Anemia, unspecified: Secondary | ICD-10-CM | POA: Diagnosis not present

## 2016-04-28 DIAGNOSIS — Z48815 Encounter for surgical aftercare following surgery on the digestive system: Secondary | ICD-10-CM | POA: Diagnosis not present

## 2016-04-28 DIAGNOSIS — Z794 Long term (current) use of insulin: Secondary | ICD-10-CM | POA: Diagnosis not present

## 2016-04-28 DIAGNOSIS — I10 Essential (primary) hypertension: Secondary | ICD-10-CM | POA: Diagnosis not present

## 2016-04-28 DIAGNOSIS — E43 Unspecified severe protein-calorie malnutrition: Secondary | ICD-10-CM | POA: Diagnosis not present

## 2016-04-28 DIAGNOSIS — Z431 Encounter for attention to gastrostomy: Secondary | ICD-10-CM | POA: Diagnosis not present

## 2016-04-29 DIAGNOSIS — D649 Anemia, unspecified: Secondary | ICD-10-CM | POA: Diagnosis not present

## 2016-04-29 DIAGNOSIS — E669 Obesity, unspecified: Secondary | ICD-10-CM | POA: Diagnosis not present

## 2016-04-29 DIAGNOSIS — E119 Type 2 diabetes mellitus without complications: Secondary | ICD-10-CM | POA: Diagnosis not present

## 2016-04-29 DIAGNOSIS — Z431 Encounter for attention to gastrostomy: Secondary | ICD-10-CM | POA: Diagnosis not present

## 2016-04-29 DIAGNOSIS — I10 Essential (primary) hypertension: Secondary | ICD-10-CM | POA: Diagnosis not present

## 2016-04-29 DIAGNOSIS — Z7951 Long term (current) use of inhaled steroids: Secondary | ICD-10-CM | POA: Diagnosis not present

## 2016-04-29 DIAGNOSIS — E43 Unspecified severe protein-calorie malnutrition: Secondary | ICD-10-CM | POA: Diagnosis not present

## 2016-04-29 DIAGNOSIS — Z794 Long term (current) use of insulin: Secondary | ICD-10-CM | POA: Diagnosis not present

## 2016-04-29 DIAGNOSIS — Z48815 Encounter for surgical aftercare following surgery on the digestive system: Secondary | ICD-10-CM | POA: Diagnosis not present

## 2016-04-29 DIAGNOSIS — T888 Other specified complications of surgical and medical care, not elsewhere classified: Secondary | ICD-10-CM | POA: Diagnosis not present

## 2016-05-01 DIAGNOSIS — K8591 Acute pancreatitis with uninfected necrosis, unspecified: Secondary | ICD-10-CM | POA: Diagnosis not present

## 2016-05-01 DIAGNOSIS — E43 Unspecified severe protein-calorie malnutrition: Secondary | ICD-10-CM | POA: Diagnosis not present

## 2016-05-02 DIAGNOSIS — K080 Exfoliation of teeth due to systemic causes: Secondary | ICD-10-CM | POA: Diagnosis not present

## 2016-05-03 DIAGNOSIS — E119 Type 2 diabetes mellitus without complications: Secondary | ICD-10-CM | POA: Diagnosis not present

## 2016-05-03 DIAGNOSIS — E43 Unspecified severe protein-calorie malnutrition: Secondary | ICD-10-CM | POA: Diagnosis not present

## 2016-05-03 DIAGNOSIS — D649 Anemia, unspecified: Secondary | ICD-10-CM | POA: Diagnosis not present

## 2016-05-03 DIAGNOSIS — E669 Obesity, unspecified: Secondary | ICD-10-CM | POA: Diagnosis not present

## 2016-05-03 DIAGNOSIS — I10 Essential (primary) hypertension: Secondary | ICD-10-CM | POA: Diagnosis not present

## 2016-05-03 DIAGNOSIS — Z794 Long term (current) use of insulin: Secondary | ICD-10-CM | POA: Diagnosis not present

## 2016-05-03 DIAGNOSIS — Z48815 Encounter for surgical aftercare following surgery on the digestive system: Secondary | ICD-10-CM | POA: Diagnosis not present

## 2016-05-03 DIAGNOSIS — Z431 Encounter for attention to gastrostomy: Secondary | ICD-10-CM | POA: Diagnosis not present

## 2016-05-03 DIAGNOSIS — Z7951 Long term (current) use of inhaled steroids: Secondary | ICD-10-CM | POA: Diagnosis not present

## 2016-05-04 DIAGNOSIS — R5381 Other malaise: Secondary | ICD-10-CM | POA: Diagnosis not present

## 2016-05-04 DIAGNOSIS — E109 Type 1 diabetes mellitus without complications: Secondary | ICD-10-CM | POA: Diagnosis not present

## 2016-05-04 DIAGNOSIS — L659 Nonscarring hair loss, unspecified: Secondary | ICD-10-CM | POA: Diagnosis not present

## 2016-05-04 DIAGNOSIS — K8591 Acute pancreatitis with uninfected necrosis, unspecified: Secondary | ICD-10-CM | POA: Diagnosis not present

## 2016-05-05 DIAGNOSIS — E669 Obesity, unspecified: Secondary | ICD-10-CM | POA: Diagnosis not present

## 2016-05-05 DIAGNOSIS — Z48815 Encounter for surgical aftercare following surgery on the digestive system: Secondary | ICD-10-CM | POA: Diagnosis not present

## 2016-05-05 DIAGNOSIS — B079 Viral wart, unspecified: Secondary | ICD-10-CM | POA: Diagnosis not present

## 2016-05-05 DIAGNOSIS — Z431 Encounter for attention to gastrostomy: Secondary | ICD-10-CM | POA: Diagnosis not present

## 2016-05-05 DIAGNOSIS — E43 Unspecified severe protein-calorie malnutrition: Secondary | ICD-10-CM | POA: Diagnosis not present

## 2016-05-05 DIAGNOSIS — K8591 Acute pancreatitis with uninfected necrosis, unspecified: Secondary | ICD-10-CM | POA: Diagnosis not present

## 2016-05-05 DIAGNOSIS — F411 Generalized anxiety disorder: Secondary | ICD-10-CM | POA: Diagnosis not present

## 2016-05-05 DIAGNOSIS — E119 Type 2 diabetes mellitus without complications: Secondary | ICD-10-CM | POA: Diagnosis not present

## 2016-05-05 DIAGNOSIS — Z7951 Long term (current) use of inhaled steroids: Secondary | ICD-10-CM | POA: Diagnosis not present

## 2016-05-05 DIAGNOSIS — T888 Other specified complications of surgical and medical care, not elsewhere classified: Secondary | ICD-10-CM | POA: Diagnosis not present

## 2016-05-05 DIAGNOSIS — Z794 Long term (current) use of insulin: Secondary | ICD-10-CM | POA: Diagnosis not present

## 2016-05-05 DIAGNOSIS — D649 Anemia, unspecified: Secondary | ICD-10-CM | POA: Diagnosis not present

## 2016-05-05 DIAGNOSIS — I10 Essential (primary) hypertension: Secondary | ICD-10-CM | POA: Diagnosis not present

## 2016-05-06 DIAGNOSIS — Z794 Long term (current) use of insulin: Secondary | ICD-10-CM | POA: Diagnosis not present

## 2016-05-06 DIAGNOSIS — D649 Anemia, unspecified: Secondary | ICD-10-CM | POA: Diagnosis not present

## 2016-05-06 DIAGNOSIS — Z48815 Encounter for surgical aftercare following surgery on the digestive system: Secondary | ICD-10-CM | POA: Diagnosis not present

## 2016-05-06 DIAGNOSIS — Z431 Encounter for attention to gastrostomy: Secondary | ICD-10-CM | POA: Diagnosis not present

## 2016-05-06 DIAGNOSIS — Z79899 Other long term (current) drug therapy: Secondary | ICD-10-CM | POA: Diagnosis not present

## 2016-05-06 DIAGNOSIS — I10 Essential (primary) hypertension: Secondary | ICD-10-CM | POA: Diagnosis not present

## 2016-05-06 DIAGNOSIS — Z931 Gastrostomy status: Secondary | ICD-10-CM | POA: Diagnosis not present

## 2016-05-06 DIAGNOSIS — D638 Anemia in other chronic diseases classified elsewhere: Secondary | ICD-10-CM | POA: Diagnosis not present

## 2016-05-06 DIAGNOSIS — E119 Type 2 diabetes mellitus without complications: Secondary | ICD-10-CM | POA: Diagnosis not present

## 2016-05-06 DIAGNOSIS — K831 Obstruction of bile duct: Secondary | ICD-10-CM | POA: Diagnosis not present

## 2016-05-06 DIAGNOSIS — E43 Unspecified severe protein-calorie malnutrition: Secondary | ICD-10-CM | POA: Diagnosis not present

## 2016-05-06 DIAGNOSIS — D735 Infarction of spleen: Secondary | ICD-10-CM | POA: Diagnosis not present

## 2016-05-06 DIAGNOSIS — Z7951 Long term (current) use of inhaled steroids: Secondary | ICD-10-CM | POA: Diagnosis not present

## 2016-05-06 DIAGNOSIS — T888 Other specified complications of surgical and medical care, not elsewhere classified: Secondary | ICD-10-CM | POA: Diagnosis not present

## 2016-05-06 DIAGNOSIS — K8511 Biliary acute pancreatitis with uninfected necrosis: Secondary | ICD-10-CM | POA: Diagnosis not present

## 2016-05-06 DIAGNOSIS — B079 Viral wart, unspecified: Secondary | ICD-10-CM | POA: Diagnosis not present

## 2016-05-06 DIAGNOSIS — E669 Obesity, unspecified: Secondary | ICD-10-CM | POA: Diagnosis not present

## 2016-05-06 DIAGNOSIS — Z9049 Acquired absence of other specified parts of digestive tract: Secondary | ICD-10-CM | POA: Diagnosis not present

## 2016-05-10 DIAGNOSIS — Z48815 Encounter for surgical aftercare following surgery on the digestive system: Secondary | ICD-10-CM | POA: Diagnosis not present

## 2016-05-10 DIAGNOSIS — E669 Obesity, unspecified: Secondary | ICD-10-CM | POA: Diagnosis not present

## 2016-05-10 DIAGNOSIS — D649 Anemia, unspecified: Secondary | ICD-10-CM | POA: Diagnosis not present

## 2016-05-10 DIAGNOSIS — I10 Essential (primary) hypertension: Secondary | ICD-10-CM | POA: Diagnosis not present

## 2016-05-10 DIAGNOSIS — Z431 Encounter for attention to gastrostomy: Secondary | ICD-10-CM | POA: Diagnosis not present

## 2016-05-10 DIAGNOSIS — Z7951 Long term (current) use of inhaled steroids: Secondary | ICD-10-CM | POA: Diagnosis not present

## 2016-05-10 DIAGNOSIS — Z794 Long term (current) use of insulin: Secondary | ICD-10-CM | POA: Diagnosis not present

## 2016-05-10 DIAGNOSIS — E119 Type 2 diabetes mellitus without complications: Secondary | ICD-10-CM | POA: Diagnosis not present

## 2016-05-10 DIAGNOSIS — E43 Unspecified severe protein-calorie malnutrition: Secondary | ICD-10-CM | POA: Diagnosis not present

## 2016-05-12 DIAGNOSIS — Z48815 Encounter for surgical aftercare following surgery on the digestive system: Secondary | ICD-10-CM | POA: Diagnosis not present

## 2016-05-12 DIAGNOSIS — Z794 Long term (current) use of insulin: Secondary | ICD-10-CM | POA: Diagnosis not present

## 2016-05-12 DIAGNOSIS — E669 Obesity, unspecified: Secondary | ICD-10-CM | POA: Diagnosis not present

## 2016-05-12 DIAGNOSIS — D649 Anemia, unspecified: Secondary | ICD-10-CM | POA: Diagnosis not present

## 2016-05-12 DIAGNOSIS — I10 Essential (primary) hypertension: Secondary | ICD-10-CM | POA: Diagnosis not present

## 2016-05-12 DIAGNOSIS — T888 Other specified complications of surgical and medical care, not elsewhere classified: Secondary | ICD-10-CM | POA: Diagnosis not present

## 2016-05-12 DIAGNOSIS — E119 Type 2 diabetes mellitus without complications: Secondary | ICD-10-CM | POA: Diagnosis not present

## 2016-05-12 DIAGNOSIS — E43 Unspecified severe protein-calorie malnutrition: Secondary | ICD-10-CM | POA: Diagnosis not present

## 2016-05-12 DIAGNOSIS — Z7951 Long term (current) use of inhaled steroids: Secondary | ICD-10-CM | POA: Diagnosis not present

## 2016-05-12 DIAGNOSIS — F411 Generalized anxiety disorder: Secondary | ICD-10-CM | POA: Diagnosis not present

## 2016-05-12 DIAGNOSIS — Z431 Encounter for attention to gastrostomy: Secondary | ICD-10-CM | POA: Diagnosis not present

## 2016-05-16 DIAGNOSIS — F411 Generalized anxiety disorder: Secondary | ICD-10-CM | POA: Diagnosis not present

## 2016-05-17 DIAGNOSIS — Z48815 Encounter for surgical aftercare following surgery on the digestive system: Secondary | ICD-10-CM | POA: Diagnosis not present

## 2016-05-17 DIAGNOSIS — E43 Unspecified severe protein-calorie malnutrition: Secondary | ICD-10-CM | POA: Diagnosis not present

## 2016-05-17 DIAGNOSIS — I10 Essential (primary) hypertension: Secondary | ICD-10-CM | POA: Diagnosis not present

## 2016-05-17 DIAGNOSIS — Z431 Encounter for attention to gastrostomy: Secondary | ICD-10-CM | POA: Diagnosis not present

## 2016-05-17 DIAGNOSIS — D649 Anemia, unspecified: Secondary | ICD-10-CM | POA: Diagnosis not present

## 2016-05-17 DIAGNOSIS — E119 Type 2 diabetes mellitus without complications: Secondary | ICD-10-CM | POA: Diagnosis not present

## 2016-05-17 DIAGNOSIS — E669 Obesity, unspecified: Secondary | ICD-10-CM | POA: Diagnosis not present

## 2016-05-17 DIAGNOSIS — Z7951 Long term (current) use of inhaled steroids: Secondary | ICD-10-CM | POA: Diagnosis not present

## 2016-05-17 DIAGNOSIS — T888 Other specified complications of surgical and medical care, not elsewhere classified: Secondary | ICD-10-CM | POA: Diagnosis not present

## 2016-05-17 DIAGNOSIS — Z794 Long term (current) use of insulin: Secondary | ICD-10-CM | POA: Diagnosis not present

## 2016-05-18 DIAGNOSIS — D649 Anemia, unspecified: Secondary | ICD-10-CM | POA: Diagnosis not present

## 2016-05-18 DIAGNOSIS — I10 Essential (primary) hypertension: Secondary | ICD-10-CM | POA: Diagnosis not present

## 2016-05-18 DIAGNOSIS — Z431 Encounter for attention to gastrostomy: Secondary | ICD-10-CM | POA: Diagnosis not present

## 2016-05-18 DIAGNOSIS — E669 Obesity, unspecified: Secondary | ICD-10-CM | POA: Diagnosis not present

## 2016-05-18 DIAGNOSIS — E119 Type 2 diabetes mellitus without complications: Secondary | ICD-10-CM | POA: Diagnosis not present

## 2016-05-18 DIAGNOSIS — T888 Other specified complications of surgical and medical care, not elsewhere classified: Secondary | ICD-10-CM | POA: Diagnosis not present

## 2016-05-18 DIAGNOSIS — E43 Unspecified severe protein-calorie malnutrition: Secondary | ICD-10-CM | POA: Diagnosis not present

## 2016-05-18 DIAGNOSIS — Z48815 Encounter for surgical aftercare following surgery on the digestive system: Secondary | ICD-10-CM | POA: Diagnosis not present

## 2016-05-18 DIAGNOSIS — Z7951 Long term (current) use of inhaled steroids: Secondary | ICD-10-CM | POA: Diagnosis not present

## 2016-05-18 DIAGNOSIS — Z794 Long term (current) use of insulin: Secondary | ICD-10-CM | POA: Diagnosis not present

## 2016-05-19 DIAGNOSIS — R509 Fever, unspecified: Secondary | ICD-10-CM | POA: Diagnosis not present

## 2016-05-19 DIAGNOSIS — Z9889 Other specified postprocedural states: Secondary | ICD-10-CM | POA: Diagnosis not present

## 2016-05-19 DIAGNOSIS — K8591 Acute pancreatitis with uninfected necrosis, unspecified: Secondary | ICD-10-CM | POA: Diagnosis not present

## 2016-05-19 DIAGNOSIS — R109 Unspecified abdominal pain: Secondary | ICD-10-CM | POA: Diagnosis not present

## 2016-05-19 DIAGNOSIS — Z4803 Encounter for change or removal of drains: Secondary | ICD-10-CM | POA: Diagnosis not present

## 2016-05-19 DIAGNOSIS — K8511 Biliary acute pancreatitis with uninfected necrosis: Secondary | ICD-10-CM | POA: Diagnosis not present

## 2016-05-20 DIAGNOSIS — Z794 Long term (current) use of insulin: Secondary | ICD-10-CM | POA: Diagnosis not present

## 2016-05-20 DIAGNOSIS — D649 Anemia, unspecified: Secondary | ICD-10-CM | POA: Diagnosis not present

## 2016-05-20 DIAGNOSIS — Z7951 Long term (current) use of inhaled steroids: Secondary | ICD-10-CM | POA: Diagnosis not present

## 2016-05-20 DIAGNOSIS — E119 Type 2 diabetes mellitus without complications: Secondary | ICD-10-CM | POA: Diagnosis not present

## 2016-05-20 DIAGNOSIS — Z431 Encounter for attention to gastrostomy: Secondary | ICD-10-CM | POA: Diagnosis not present

## 2016-05-20 DIAGNOSIS — T888 Other specified complications of surgical and medical care, not elsewhere classified: Secondary | ICD-10-CM | POA: Diagnosis not present

## 2016-05-20 DIAGNOSIS — E669 Obesity, unspecified: Secondary | ICD-10-CM | POA: Diagnosis not present

## 2016-05-20 DIAGNOSIS — E43 Unspecified severe protein-calorie malnutrition: Secondary | ICD-10-CM | POA: Diagnosis not present

## 2016-05-20 DIAGNOSIS — I10 Essential (primary) hypertension: Secondary | ICD-10-CM | POA: Diagnosis not present

## 2016-05-20 DIAGNOSIS — Z48815 Encounter for surgical aftercare following surgery on the digestive system: Secondary | ICD-10-CM | POA: Diagnosis not present

## 2016-05-23 DIAGNOSIS — E43 Unspecified severe protein-calorie malnutrition: Secondary | ICD-10-CM | POA: Diagnosis not present

## 2016-05-23 DIAGNOSIS — K8591 Acute pancreatitis with uninfected necrosis, unspecified: Secondary | ICD-10-CM | POA: Diagnosis not present

## 2016-05-24 DIAGNOSIS — E43 Unspecified severe protein-calorie malnutrition: Secondary | ICD-10-CM | POA: Diagnosis not present

## 2016-05-24 DIAGNOSIS — Z431 Encounter for attention to gastrostomy: Secondary | ICD-10-CM | POA: Diagnosis not present

## 2016-05-24 DIAGNOSIS — Z794 Long term (current) use of insulin: Secondary | ICD-10-CM | POA: Diagnosis not present

## 2016-05-24 DIAGNOSIS — D649 Anemia, unspecified: Secondary | ICD-10-CM | POA: Diagnosis not present

## 2016-05-24 DIAGNOSIS — I10 Essential (primary) hypertension: Secondary | ICD-10-CM | POA: Diagnosis not present

## 2016-05-24 DIAGNOSIS — E119 Type 2 diabetes mellitus without complications: Secondary | ICD-10-CM | POA: Diagnosis not present

## 2016-05-24 DIAGNOSIS — F411 Generalized anxiety disorder: Secondary | ICD-10-CM | POA: Diagnosis not present

## 2016-05-24 DIAGNOSIS — T888 Other specified complications of surgical and medical care, not elsewhere classified: Secondary | ICD-10-CM | POA: Diagnosis not present

## 2016-05-24 DIAGNOSIS — Z7951 Long term (current) use of inhaled steroids: Secondary | ICD-10-CM | POA: Diagnosis not present

## 2016-05-24 DIAGNOSIS — Z48815 Encounter for surgical aftercare following surgery on the digestive system: Secondary | ICD-10-CM | POA: Diagnosis not present

## 2016-05-24 DIAGNOSIS — E669 Obesity, unspecified: Secondary | ICD-10-CM | POA: Diagnosis not present

## 2016-05-26 DIAGNOSIS — Z431 Encounter for attention to gastrostomy: Secondary | ICD-10-CM | POA: Diagnosis not present

## 2016-05-26 DIAGNOSIS — Z48815 Encounter for surgical aftercare following surgery on the digestive system: Secondary | ICD-10-CM | POA: Diagnosis not present

## 2016-05-26 DIAGNOSIS — E119 Type 2 diabetes mellitus without complications: Secondary | ICD-10-CM | POA: Diagnosis not present

## 2016-05-26 DIAGNOSIS — E669 Obesity, unspecified: Secondary | ICD-10-CM | POA: Diagnosis not present

## 2016-05-26 DIAGNOSIS — D649 Anemia, unspecified: Secondary | ICD-10-CM | POA: Diagnosis not present

## 2016-05-26 DIAGNOSIS — Z794 Long term (current) use of insulin: Secondary | ICD-10-CM | POA: Diagnosis not present

## 2016-05-26 DIAGNOSIS — I10 Essential (primary) hypertension: Secondary | ICD-10-CM | POA: Diagnosis not present

## 2016-05-26 DIAGNOSIS — Z7951 Long term (current) use of inhaled steroids: Secondary | ICD-10-CM | POA: Diagnosis not present

## 2016-05-26 DIAGNOSIS — E43 Unspecified severe protein-calorie malnutrition: Secondary | ICD-10-CM | POA: Diagnosis not present

## 2016-05-30 DIAGNOSIS — F411 Generalized anxiety disorder: Secondary | ICD-10-CM | POA: Diagnosis not present

## 2016-06-01 DIAGNOSIS — K8591 Acute pancreatitis with uninfected necrosis, unspecified: Secondary | ICD-10-CM | POA: Diagnosis not present

## 2016-06-01 DIAGNOSIS — E109 Type 1 diabetes mellitus without complications: Secondary | ICD-10-CM | POA: Diagnosis not present

## 2016-06-01 DIAGNOSIS — E43 Unspecified severe protein-calorie malnutrition: Secondary | ICD-10-CM | POA: Diagnosis not present

## 2016-06-01 DIAGNOSIS — E441 Mild protein-calorie malnutrition: Secondary | ICD-10-CM | POA: Diagnosis not present

## 2016-06-01 DIAGNOSIS — I10 Essential (primary) hypertension: Secondary | ICD-10-CM | POA: Diagnosis not present

## 2016-06-02 DIAGNOSIS — Z7951 Long term (current) use of inhaled steroids: Secondary | ICD-10-CM | POA: Diagnosis not present

## 2016-06-02 DIAGNOSIS — I10 Essential (primary) hypertension: Secondary | ICD-10-CM | POA: Diagnosis not present

## 2016-06-02 DIAGNOSIS — E669 Obesity, unspecified: Secondary | ICD-10-CM | POA: Diagnosis not present

## 2016-06-02 DIAGNOSIS — Z48815 Encounter for surgical aftercare following surgery on the digestive system: Secondary | ICD-10-CM | POA: Diagnosis not present

## 2016-06-02 DIAGNOSIS — Z431 Encounter for attention to gastrostomy: Secondary | ICD-10-CM | POA: Diagnosis not present

## 2016-06-02 DIAGNOSIS — T888 Other specified complications of surgical and medical care, not elsewhere classified: Secondary | ICD-10-CM | POA: Diagnosis not present

## 2016-06-02 DIAGNOSIS — E43 Unspecified severe protein-calorie malnutrition: Secondary | ICD-10-CM | POA: Diagnosis not present

## 2016-06-02 DIAGNOSIS — D649 Anemia, unspecified: Secondary | ICD-10-CM | POA: Diagnosis not present

## 2016-06-02 DIAGNOSIS — E119 Type 2 diabetes mellitus without complications: Secondary | ICD-10-CM | POA: Diagnosis not present

## 2016-06-02 DIAGNOSIS — Z794 Long term (current) use of insulin: Secondary | ICD-10-CM | POA: Diagnosis not present

## 2016-06-05 DIAGNOSIS — K8591 Acute pancreatitis with uninfected necrosis, unspecified: Secondary | ICD-10-CM | POA: Diagnosis not present

## 2016-06-07 DIAGNOSIS — F411 Generalized anxiety disorder: Secondary | ICD-10-CM | POA: Diagnosis not present

## 2016-06-08 DIAGNOSIS — E43 Unspecified severe protein-calorie malnutrition: Secondary | ICD-10-CM | POA: Diagnosis not present

## 2016-06-08 DIAGNOSIS — Z7951 Long term (current) use of inhaled steroids: Secondary | ICD-10-CM | POA: Diagnosis not present

## 2016-06-08 DIAGNOSIS — I10 Essential (primary) hypertension: Secondary | ICD-10-CM | POA: Diagnosis not present

## 2016-06-08 DIAGNOSIS — Z431 Encounter for attention to gastrostomy: Secondary | ICD-10-CM | POA: Diagnosis not present

## 2016-06-08 DIAGNOSIS — E119 Type 2 diabetes mellitus without complications: Secondary | ICD-10-CM | POA: Diagnosis not present

## 2016-06-08 DIAGNOSIS — E669 Obesity, unspecified: Secondary | ICD-10-CM | POA: Diagnosis not present

## 2016-06-08 DIAGNOSIS — Z48815 Encounter for surgical aftercare following surgery on the digestive system: Secondary | ICD-10-CM | POA: Diagnosis not present

## 2016-06-08 DIAGNOSIS — D649 Anemia, unspecified: Secondary | ICD-10-CM | POA: Diagnosis not present

## 2016-06-08 DIAGNOSIS — Z794 Long term (current) use of insulin: Secondary | ICD-10-CM | POA: Diagnosis not present

## 2016-06-08 DIAGNOSIS — T888 Other specified complications of surgical and medical care, not elsewhere classified: Secondary | ICD-10-CM | POA: Diagnosis not present

## 2016-06-09 DIAGNOSIS — Z9889 Other specified postprocedural states: Secondary | ICD-10-CM | POA: Diagnosis not present

## 2016-06-09 DIAGNOSIS — I10 Essential (primary) hypertension: Secondary | ICD-10-CM | POA: Diagnosis not present

## 2016-06-09 DIAGNOSIS — Z794 Long term (current) use of insulin: Secondary | ICD-10-CM | POA: Diagnosis not present

## 2016-06-09 DIAGNOSIS — E109 Type 1 diabetes mellitus without complications: Secondary | ICD-10-CM | POA: Diagnosis not present

## 2016-06-09 DIAGNOSIS — E119 Type 2 diabetes mellitus without complications: Secondary | ICD-10-CM | POA: Diagnosis not present

## 2016-06-09 DIAGNOSIS — Z8719 Personal history of other diseases of the digestive system: Secondary | ICD-10-CM | POA: Diagnosis not present

## 2016-06-09 DIAGNOSIS — E43 Unspecified severe protein-calorie malnutrition: Secondary | ICD-10-CM | POA: Diagnosis not present

## 2016-06-09 DIAGNOSIS — Z6835 Body mass index (BMI) 35.0-35.9, adult: Secondary | ICD-10-CM | POA: Diagnosis not present

## 2016-06-09 DIAGNOSIS — Z9049 Acquired absence of other specified parts of digestive tract: Secondary | ICD-10-CM | POA: Diagnosis not present

## 2016-06-09 DIAGNOSIS — K8511 Biliary acute pancreatitis with uninfected necrosis: Secondary | ICD-10-CM | POA: Diagnosis not present

## 2016-06-09 DIAGNOSIS — Z79899 Other long term (current) drug therapy: Secondary | ICD-10-CM | POA: Diagnosis not present

## 2016-06-13 DIAGNOSIS — F411 Generalized anxiety disorder: Secondary | ICD-10-CM | POA: Diagnosis not present

## 2016-06-14 DIAGNOSIS — R5381 Other malaise: Secondary | ICD-10-CM | POA: Diagnosis not present

## 2016-06-14 DIAGNOSIS — I10 Essential (primary) hypertension: Secondary | ICD-10-CM | POA: Diagnosis not present

## 2016-06-14 DIAGNOSIS — E109 Type 1 diabetes mellitus without complications: Secondary | ICD-10-CM | POA: Diagnosis not present

## 2016-06-14 DIAGNOSIS — K8591 Acute pancreatitis with uninfected necrosis, unspecified: Secondary | ICD-10-CM | POA: Diagnosis not present

## 2016-06-15 DIAGNOSIS — I10 Essential (primary) hypertension: Secondary | ICD-10-CM | POA: Diagnosis not present

## 2016-06-15 DIAGNOSIS — D649 Anemia, unspecified: Secondary | ICD-10-CM | POA: Diagnosis not present

## 2016-06-15 DIAGNOSIS — Z7951 Long term (current) use of inhaled steroids: Secondary | ICD-10-CM | POA: Diagnosis not present

## 2016-06-15 DIAGNOSIS — T888 Other specified complications of surgical and medical care, not elsewhere classified: Secondary | ICD-10-CM | POA: Diagnosis not present

## 2016-06-15 DIAGNOSIS — E43 Unspecified severe protein-calorie malnutrition: Secondary | ICD-10-CM | POA: Diagnosis not present

## 2016-06-15 DIAGNOSIS — Z794 Long term (current) use of insulin: Secondary | ICD-10-CM | POA: Diagnosis not present

## 2016-06-15 DIAGNOSIS — E119 Type 2 diabetes mellitus without complications: Secondary | ICD-10-CM | POA: Diagnosis not present

## 2016-06-15 DIAGNOSIS — E669 Obesity, unspecified: Secondary | ICD-10-CM | POA: Diagnosis not present

## 2016-06-15 DIAGNOSIS — Z48815 Encounter for surgical aftercare following surgery on the digestive system: Secondary | ICD-10-CM | POA: Diagnosis not present

## 2016-06-15 DIAGNOSIS — Z931 Gastrostomy status: Secondary | ICD-10-CM | POA: Diagnosis not present

## 2016-06-20 DIAGNOSIS — F411 Generalized anxiety disorder: Secondary | ICD-10-CM | POA: Diagnosis not present

## 2016-06-24 DIAGNOSIS — Z794 Long term (current) use of insulin: Secondary | ICD-10-CM | POA: Diagnosis not present

## 2016-06-24 DIAGNOSIS — E43 Unspecified severe protein-calorie malnutrition: Secondary | ICD-10-CM | POA: Diagnosis not present

## 2016-06-24 DIAGNOSIS — T888 Other specified complications of surgical and medical care, not elsewhere classified: Secondary | ICD-10-CM | POA: Diagnosis not present

## 2016-06-24 DIAGNOSIS — Z931 Gastrostomy status: Secondary | ICD-10-CM | POA: Diagnosis not present

## 2016-06-24 DIAGNOSIS — Z7951 Long term (current) use of inhaled steroids: Secondary | ICD-10-CM | POA: Diagnosis not present

## 2016-06-24 DIAGNOSIS — I10 Essential (primary) hypertension: Secondary | ICD-10-CM | POA: Diagnosis not present

## 2016-06-24 DIAGNOSIS — E669 Obesity, unspecified: Secondary | ICD-10-CM | POA: Diagnosis not present

## 2016-06-24 DIAGNOSIS — E119 Type 2 diabetes mellitus without complications: Secondary | ICD-10-CM | POA: Diagnosis not present

## 2016-06-24 DIAGNOSIS — D649 Anemia, unspecified: Secondary | ICD-10-CM | POA: Diagnosis not present

## 2016-06-24 DIAGNOSIS — Z48815 Encounter for surgical aftercare following surgery on the digestive system: Secondary | ICD-10-CM | POA: Diagnosis not present

## 2016-06-27 DIAGNOSIS — R5381 Other malaise: Secondary | ICD-10-CM | POA: Diagnosis not present

## 2016-06-27 DIAGNOSIS — R5383 Other fatigue: Secondary | ICD-10-CM | POA: Diagnosis not present

## 2016-06-27 DIAGNOSIS — I10 Essential (primary) hypertension: Secondary | ICD-10-CM | POA: Diagnosis not present

## 2016-06-27 DIAGNOSIS — M6281 Muscle weakness (generalized): Secondary | ICD-10-CM | POA: Diagnosis not present

## 2016-06-29 DIAGNOSIS — T888 Other specified complications of surgical and medical care, not elsewhere classified: Secondary | ICD-10-CM | POA: Diagnosis not present

## 2016-06-29 DIAGNOSIS — E119 Type 2 diabetes mellitus without complications: Secondary | ICD-10-CM | POA: Diagnosis not present

## 2016-06-29 DIAGNOSIS — Z931 Gastrostomy status: Secondary | ICD-10-CM | POA: Diagnosis not present

## 2016-06-29 DIAGNOSIS — E669 Obesity, unspecified: Secondary | ICD-10-CM | POA: Diagnosis not present

## 2016-06-29 DIAGNOSIS — E43 Unspecified severe protein-calorie malnutrition: Secondary | ICD-10-CM | POA: Diagnosis not present

## 2016-06-29 DIAGNOSIS — Z48815 Encounter for surgical aftercare following surgery on the digestive system: Secondary | ICD-10-CM | POA: Diagnosis not present

## 2016-06-29 DIAGNOSIS — Z7951 Long term (current) use of inhaled steroids: Secondary | ICD-10-CM | POA: Diagnosis not present

## 2016-06-29 DIAGNOSIS — I10 Essential (primary) hypertension: Secondary | ICD-10-CM | POA: Diagnosis not present

## 2016-06-29 DIAGNOSIS — Z794 Long term (current) use of insulin: Secondary | ICD-10-CM | POA: Diagnosis not present

## 2016-06-29 DIAGNOSIS — M6281 Muscle weakness (generalized): Secondary | ICD-10-CM | POA: Diagnosis not present

## 2016-06-29 DIAGNOSIS — D649 Anemia, unspecified: Secondary | ICD-10-CM | POA: Diagnosis not present

## 2016-06-29 DIAGNOSIS — R5383 Other fatigue: Secondary | ICD-10-CM | POA: Diagnosis not present

## 2016-06-29 DIAGNOSIS — R5381 Other malaise: Secondary | ICD-10-CM | POA: Diagnosis not present

## 2016-06-30 ENCOUNTER — Ambulatory Visit: Payer: Federal, State, Local not specified - PPO | Admitting: Psychology

## 2016-07-01 DIAGNOSIS — R5383 Other fatigue: Secondary | ICD-10-CM | POA: Diagnosis not present

## 2016-07-01 DIAGNOSIS — I10 Essential (primary) hypertension: Secondary | ICD-10-CM | POA: Diagnosis not present

## 2016-07-01 DIAGNOSIS — M6281 Muscle weakness (generalized): Secondary | ICD-10-CM | POA: Diagnosis not present

## 2016-07-01 DIAGNOSIS — R5381 Other malaise: Secondary | ICD-10-CM | POA: Diagnosis not present

## 2016-07-04 DIAGNOSIS — R5381 Other malaise: Secondary | ICD-10-CM | POA: Diagnosis not present

## 2016-07-04 DIAGNOSIS — I10 Essential (primary) hypertension: Secondary | ICD-10-CM | POA: Diagnosis not present

## 2016-07-04 DIAGNOSIS — M6281 Muscle weakness (generalized): Secondary | ICD-10-CM | POA: Diagnosis not present

## 2016-07-04 DIAGNOSIS — R5383 Other fatigue: Secondary | ICD-10-CM | POA: Diagnosis not present

## 2016-07-05 DIAGNOSIS — R5381 Other malaise: Secondary | ICD-10-CM | POA: Diagnosis not present

## 2016-07-05 DIAGNOSIS — R5383 Other fatigue: Secondary | ICD-10-CM | POA: Diagnosis not present

## 2016-07-05 DIAGNOSIS — M6281 Muscle weakness (generalized): Secondary | ICD-10-CM | POA: Diagnosis not present

## 2016-07-05 DIAGNOSIS — I10 Essential (primary) hypertension: Secondary | ICD-10-CM | POA: Diagnosis not present

## 2016-07-06 DIAGNOSIS — Z794 Long term (current) use of insulin: Secondary | ICD-10-CM | POA: Diagnosis not present

## 2016-07-06 DIAGNOSIS — I10 Essential (primary) hypertension: Secondary | ICD-10-CM | POA: Diagnosis not present

## 2016-07-06 DIAGNOSIS — K831 Obstruction of bile duct: Secondary | ICD-10-CM | POA: Diagnosis not present

## 2016-07-06 DIAGNOSIS — Z79899 Other long term (current) drug therapy: Secondary | ICD-10-CM | POA: Diagnosis not present

## 2016-07-06 DIAGNOSIS — E109 Type 1 diabetes mellitus without complications: Secondary | ICD-10-CM | POA: Diagnosis not present

## 2016-07-06 DIAGNOSIS — Z7951 Long term (current) use of inhaled steroids: Secondary | ICD-10-CM | POA: Diagnosis not present

## 2016-07-06 DIAGNOSIS — E43 Unspecified severe protein-calorie malnutrition: Secondary | ICD-10-CM | POA: Diagnosis not present

## 2016-07-06 DIAGNOSIS — Z6835 Body mass index (BMI) 35.0-35.9, adult: Secondary | ICD-10-CM | POA: Diagnosis not present

## 2016-07-06 DIAGNOSIS — D638 Anemia in other chronic diseases classified elsewhere: Secondary | ICD-10-CM | POA: Diagnosis not present

## 2016-07-07 DIAGNOSIS — D649 Anemia, unspecified: Secondary | ICD-10-CM | POA: Diagnosis not present

## 2016-07-07 DIAGNOSIS — Z794 Long term (current) use of insulin: Secondary | ICD-10-CM | POA: Diagnosis not present

## 2016-07-07 DIAGNOSIS — Z7951 Long term (current) use of inhaled steroids: Secondary | ICD-10-CM | POA: Diagnosis not present

## 2016-07-07 DIAGNOSIS — I10 Essential (primary) hypertension: Secondary | ICD-10-CM | POA: Diagnosis not present

## 2016-07-07 DIAGNOSIS — Z931 Gastrostomy status: Secondary | ICD-10-CM | POA: Diagnosis not present

## 2016-07-07 DIAGNOSIS — E119 Type 2 diabetes mellitus without complications: Secondary | ICD-10-CM | POA: Diagnosis not present

## 2016-07-07 DIAGNOSIS — E43 Unspecified severe protein-calorie malnutrition: Secondary | ICD-10-CM | POA: Diagnosis not present

## 2016-07-07 DIAGNOSIS — E669 Obesity, unspecified: Secondary | ICD-10-CM | POA: Diagnosis not present

## 2016-07-07 DIAGNOSIS — Z48815 Encounter for surgical aftercare following surgery on the digestive system: Secondary | ICD-10-CM | POA: Diagnosis not present

## 2016-07-08 DIAGNOSIS — R5383 Other fatigue: Secondary | ICD-10-CM | POA: Diagnosis not present

## 2016-07-08 DIAGNOSIS — M6281 Muscle weakness (generalized): Secondary | ICD-10-CM | POA: Diagnosis not present

## 2016-07-08 DIAGNOSIS — R5381 Other malaise: Secondary | ICD-10-CM | POA: Diagnosis not present

## 2016-07-08 DIAGNOSIS — I10 Essential (primary) hypertension: Secondary | ICD-10-CM | POA: Diagnosis not present

## 2016-07-11 DIAGNOSIS — M6281 Muscle weakness (generalized): Secondary | ICD-10-CM | POA: Diagnosis not present

## 2016-07-11 DIAGNOSIS — R5381 Other malaise: Secondary | ICD-10-CM | POA: Diagnosis not present

## 2016-07-11 DIAGNOSIS — R5383 Other fatigue: Secondary | ICD-10-CM | POA: Diagnosis not present

## 2016-07-11 DIAGNOSIS — I10 Essential (primary) hypertension: Secondary | ICD-10-CM | POA: Diagnosis not present

## 2016-07-13 DIAGNOSIS — E119 Type 2 diabetes mellitus without complications: Secondary | ICD-10-CM | POA: Diagnosis not present

## 2016-07-13 DIAGNOSIS — M6281 Muscle weakness (generalized): Secondary | ICD-10-CM | POA: Diagnosis not present

## 2016-07-13 DIAGNOSIS — R5381 Other malaise: Secondary | ICD-10-CM | POA: Diagnosis not present

## 2016-07-13 DIAGNOSIS — Z7951 Long term (current) use of inhaled steroids: Secondary | ICD-10-CM | POA: Diagnosis not present

## 2016-07-13 DIAGNOSIS — I10 Essential (primary) hypertension: Secondary | ICD-10-CM | POA: Diagnosis not present

## 2016-07-13 DIAGNOSIS — Z794 Long term (current) use of insulin: Secondary | ICD-10-CM | POA: Diagnosis not present

## 2016-07-13 DIAGNOSIS — D649 Anemia, unspecified: Secondary | ICD-10-CM | POA: Diagnosis not present

## 2016-07-13 DIAGNOSIS — E43 Unspecified severe protein-calorie malnutrition: Secondary | ICD-10-CM | POA: Diagnosis not present

## 2016-07-13 DIAGNOSIS — Z48815 Encounter for surgical aftercare following surgery on the digestive system: Secondary | ICD-10-CM | POA: Diagnosis not present

## 2016-07-13 DIAGNOSIS — Z931 Gastrostomy status: Secondary | ICD-10-CM | POA: Diagnosis not present

## 2016-07-13 DIAGNOSIS — E669 Obesity, unspecified: Secondary | ICD-10-CM | POA: Diagnosis not present

## 2016-07-13 DIAGNOSIS — R5383 Other fatigue: Secondary | ICD-10-CM | POA: Diagnosis not present

## 2016-07-15 DIAGNOSIS — M6281 Muscle weakness (generalized): Secondary | ICD-10-CM | POA: Diagnosis not present

## 2016-07-15 DIAGNOSIS — I10 Essential (primary) hypertension: Secondary | ICD-10-CM | POA: Diagnosis not present

## 2016-07-15 DIAGNOSIS — R5383 Other fatigue: Secondary | ICD-10-CM | POA: Diagnosis not present

## 2016-07-15 DIAGNOSIS — R5381 Other malaise: Secondary | ICD-10-CM | POA: Diagnosis not present

## 2016-07-18 DIAGNOSIS — I10 Essential (primary) hypertension: Secondary | ICD-10-CM | POA: Diagnosis not present

## 2016-07-18 DIAGNOSIS — R5381 Other malaise: Secondary | ICD-10-CM | POA: Diagnosis not present

## 2016-07-18 DIAGNOSIS — R5383 Other fatigue: Secondary | ICD-10-CM | POA: Diagnosis not present

## 2016-07-18 DIAGNOSIS — M6281 Muscle weakness (generalized): Secondary | ICD-10-CM | POA: Diagnosis not present

## 2016-07-19 DIAGNOSIS — I10 Essential (primary) hypertension: Secondary | ICD-10-CM | POA: Diagnosis not present

## 2016-07-19 DIAGNOSIS — R5381 Other malaise: Secondary | ICD-10-CM | POA: Diagnosis not present

## 2016-07-19 DIAGNOSIS — M6281 Muscle weakness (generalized): Secondary | ICD-10-CM | POA: Diagnosis not present

## 2016-07-19 DIAGNOSIS — R5383 Other fatigue: Secondary | ICD-10-CM | POA: Diagnosis not present

## 2016-07-21 ENCOUNTER — Ambulatory Visit (INDEPENDENT_AMBULATORY_CARE_PROVIDER_SITE_OTHER): Payer: Federal, State, Local not specified - PPO | Admitting: Psychology

## 2016-07-21 DIAGNOSIS — M6281 Muscle weakness (generalized): Secondary | ICD-10-CM | POA: Diagnosis not present

## 2016-07-21 DIAGNOSIS — F411 Generalized anxiety disorder: Secondary | ICD-10-CM | POA: Diagnosis not present

## 2016-07-21 DIAGNOSIS — R5381 Other malaise: Secondary | ICD-10-CM | POA: Diagnosis not present

## 2016-07-21 DIAGNOSIS — I10 Essential (primary) hypertension: Secondary | ICD-10-CM | POA: Diagnosis not present

## 2016-07-21 DIAGNOSIS — R5383 Other fatigue: Secondary | ICD-10-CM | POA: Diagnosis not present

## 2016-07-27 DIAGNOSIS — R5381 Other malaise: Secondary | ICD-10-CM | POA: Diagnosis not present

## 2016-07-27 DIAGNOSIS — I10 Essential (primary) hypertension: Secondary | ICD-10-CM | POA: Diagnosis not present

## 2016-07-27 DIAGNOSIS — R5383 Other fatigue: Secondary | ICD-10-CM | POA: Diagnosis not present

## 2016-07-27 DIAGNOSIS — M6281 Muscle weakness (generalized): Secondary | ICD-10-CM | POA: Diagnosis not present

## 2016-07-28 DIAGNOSIS — R5383 Other fatigue: Secondary | ICD-10-CM | POA: Diagnosis not present

## 2016-07-28 DIAGNOSIS — R5381 Other malaise: Secondary | ICD-10-CM | POA: Diagnosis not present

## 2016-07-28 DIAGNOSIS — M6281 Muscle weakness (generalized): Secondary | ICD-10-CM | POA: Diagnosis not present

## 2016-07-28 DIAGNOSIS — I10 Essential (primary) hypertension: Secondary | ICD-10-CM | POA: Diagnosis not present

## 2016-08-03 DIAGNOSIS — R5381 Other malaise: Secondary | ICD-10-CM | POA: Diagnosis not present

## 2016-08-03 DIAGNOSIS — M6281 Muscle weakness (generalized): Secondary | ICD-10-CM | POA: Diagnosis not present

## 2016-08-03 DIAGNOSIS — R5383 Other fatigue: Secondary | ICD-10-CM | POA: Diagnosis not present

## 2016-08-03 DIAGNOSIS — I10 Essential (primary) hypertension: Secondary | ICD-10-CM | POA: Diagnosis not present

## 2016-08-08 ENCOUNTER — Ambulatory Visit: Payer: Federal, State, Local not specified - PPO | Admitting: Psychology

## 2016-08-10 DIAGNOSIS — R5383 Other fatigue: Secondary | ICD-10-CM | POA: Diagnosis not present

## 2016-08-10 DIAGNOSIS — R5381 Other malaise: Secondary | ICD-10-CM | POA: Diagnosis not present

## 2016-08-10 DIAGNOSIS — I10 Essential (primary) hypertension: Secondary | ICD-10-CM | POA: Diagnosis not present

## 2016-08-10 DIAGNOSIS — M6281 Muscle weakness (generalized): Secondary | ICD-10-CM | POA: Diagnosis not present

## 2016-08-15 ENCOUNTER — Ambulatory Visit (INDEPENDENT_AMBULATORY_CARE_PROVIDER_SITE_OTHER): Payer: Federal, State, Local not specified - PPO | Admitting: Psychology

## 2016-08-15 DIAGNOSIS — F411 Generalized anxiety disorder: Secondary | ICD-10-CM | POA: Diagnosis not present

## 2016-08-29 ENCOUNTER — Ambulatory Visit (INDEPENDENT_AMBULATORY_CARE_PROVIDER_SITE_OTHER): Payer: Federal, State, Local not specified - PPO | Admitting: Psychology

## 2016-08-29 DIAGNOSIS — F411 Generalized anxiety disorder: Secondary | ICD-10-CM

## 2016-09-05 ENCOUNTER — Ambulatory Visit: Payer: Federal, State, Local not specified - PPO | Admitting: Psychology

## 2016-09-12 ENCOUNTER — Ambulatory Visit (INDEPENDENT_AMBULATORY_CARE_PROVIDER_SITE_OTHER): Payer: Federal, State, Local not specified - PPO | Admitting: Psychology

## 2016-09-12 DIAGNOSIS — F411 Generalized anxiety disorder: Secondary | ICD-10-CM | POA: Diagnosis not present

## 2016-09-15 DIAGNOSIS — Z6836 Body mass index (BMI) 36.0-36.9, adult: Secondary | ICD-10-CM | POA: Diagnosis not present

## 2016-09-15 DIAGNOSIS — Z48815 Encounter for surgical aftercare following surgery on the digestive system: Secondary | ICD-10-CM | POA: Diagnosis not present

## 2016-09-15 DIAGNOSIS — K8511 Biliary acute pancreatitis with uninfected necrosis: Secondary | ICD-10-CM | POA: Diagnosis not present

## 2016-09-15 DIAGNOSIS — Z8719 Personal history of other diseases of the digestive system: Secondary | ICD-10-CM | POA: Diagnosis not present

## 2016-09-15 DIAGNOSIS — I10 Essential (primary) hypertension: Secondary | ICD-10-CM | POA: Diagnosis not present

## 2016-09-15 DIAGNOSIS — E43 Unspecified severe protein-calorie malnutrition: Secondary | ICD-10-CM | POA: Diagnosis not present

## 2016-09-15 DIAGNOSIS — E119 Type 2 diabetes mellitus without complications: Secondary | ICD-10-CM | POA: Diagnosis not present

## 2016-09-15 DIAGNOSIS — Z9049 Acquired absence of other specified parts of digestive tract: Secondary | ICD-10-CM | POA: Diagnosis not present

## 2016-09-15 DIAGNOSIS — E118 Type 2 diabetes mellitus with unspecified complications: Secondary | ICD-10-CM | POA: Diagnosis not present

## 2016-09-15 DIAGNOSIS — I82890 Acute embolism and thrombosis of other specified veins: Secondary | ICD-10-CM | POA: Diagnosis not present

## 2016-09-19 ENCOUNTER — Ambulatory Visit (INDEPENDENT_AMBULATORY_CARE_PROVIDER_SITE_OTHER): Payer: Federal, State, Local not specified - PPO | Admitting: Psychology

## 2016-09-19 DIAGNOSIS — Z794 Long term (current) use of insulin: Secondary | ICD-10-CM | POA: Diagnosis not present

## 2016-09-19 DIAGNOSIS — F411 Generalized anxiety disorder: Secondary | ICD-10-CM

## 2016-09-19 DIAGNOSIS — E119 Type 2 diabetes mellitus without complications: Secondary | ICD-10-CM | POA: Diagnosis not present

## 2016-09-19 DIAGNOSIS — I10 Essential (primary) hypertension: Secondary | ICD-10-CM | POA: Diagnosis not present

## 2016-09-19 DIAGNOSIS — E785 Hyperlipidemia, unspecified: Secondary | ICD-10-CM | POA: Diagnosis not present

## 2016-09-19 DIAGNOSIS — E1165 Type 2 diabetes mellitus with hyperglycemia: Secondary | ICD-10-CM | POA: Diagnosis not present

## 2016-09-21 DIAGNOSIS — I10 Essential (primary) hypertension: Secondary | ICD-10-CM | POA: Diagnosis not present

## 2016-09-26 ENCOUNTER — Ambulatory Visit (INDEPENDENT_AMBULATORY_CARE_PROVIDER_SITE_OTHER): Payer: Federal, State, Local not specified - PPO | Admitting: Psychology

## 2016-09-26 DIAGNOSIS — F411 Generalized anxiety disorder: Secondary | ICD-10-CM | POA: Diagnosis not present

## 2016-10-04 DIAGNOSIS — D638 Anemia in other chronic diseases classified elsewhere: Secondary | ICD-10-CM | POA: Diagnosis not present

## 2016-10-04 DIAGNOSIS — K8511 Biliary acute pancreatitis with uninfected necrosis: Secondary | ICD-10-CM | POA: Diagnosis not present

## 2016-10-04 DIAGNOSIS — Z4682 Encounter for fitting and adjustment of non-vascular catheter: Secondary | ICD-10-CM | POA: Diagnosis not present

## 2016-10-04 DIAGNOSIS — K831 Obstruction of bile duct: Secondary | ICD-10-CM | POA: Diagnosis not present

## 2016-10-04 DIAGNOSIS — E109 Type 1 diabetes mellitus without complications: Secondary | ICD-10-CM | POA: Diagnosis not present

## 2016-10-04 DIAGNOSIS — Z79899 Other long term (current) drug therapy: Secondary | ICD-10-CM | POA: Diagnosis not present

## 2016-10-04 DIAGNOSIS — Z86718 Personal history of other venous thrombosis and embolism: Secondary | ICD-10-CM | POA: Diagnosis not present

## 2016-10-04 DIAGNOSIS — I10 Essential (primary) hypertension: Secondary | ICD-10-CM | POA: Diagnosis not present

## 2016-10-04 DIAGNOSIS — Z794 Long term (current) use of insulin: Secondary | ICD-10-CM | POA: Diagnosis not present

## 2016-10-10 ENCOUNTER — Ambulatory Visit (INDEPENDENT_AMBULATORY_CARE_PROVIDER_SITE_OTHER): Payer: Federal, State, Local not specified - PPO | Admitting: Psychology

## 2016-10-10 DIAGNOSIS — F411 Generalized anxiety disorder: Secondary | ICD-10-CM | POA: Diagnosis not present

## 2016-10-24 ENCOUNTER — Ambulatory Visit (INDEPENDENT_AMBULATORY_CARE_PROVIDER_SITE_OTHER): Payer: Federal, State, Local not specified - PPO | Admitting: Psychology

## 2016-10-24 DIAGNOSIS — F411 Generalized anxiety disorder: Secondary | ICD-10-CM | POA: Diagnosis not present

## 2016-11-07 ENCOUNTER — Ambulatory Visit (INDEPENDENT_AMBULATORY_CARE_PROVIDER_SITE_OTHER): Payer: Federal, State, Local not specified - PPO | Admitting: Psychology

## 2016-11-07 DIAGNOSIS — F411 Generalized anxiety disorder: Secondary | ICD-10-CM

## 2016-11-16 DIAGNOSIS — K080 Exfoliation of teeth due to systemic causes: Secondary | ICD-10-CM | POA: Diagnosis not present

## 2016-11-28 ENCOUNTER — Ambulatory Visit (INDEPENDENT_AMBULATORY_CARE_PROVIDER_SITE_OTHER): Payer: Federal, State, Local not specified - PPO | Admitting: Psychology

## 2016-11-28 DIAGNOSIS — F411 Generalized anxiety disorder: Secondary | ICD-10-CM | POA: Diagnosis not present

## 2016-12-14 DIAGNOSIS — K080 Exfoliation of teeth due to systemic causes: Secondary | ICD-10-CM | POA: Diagnosis not present

## 2016-12-19 ENCOUNTER — Ambulatory Visit (INDEPENDENT_AMBULATORY_CARE_PROVIDER_SITE_OTHER): Payer: Federal, State, Local not specified - PPO | Admitting: Psychology

## 2016-12-19 DIAGNOSIS — F411 Generalized anxiety disorder: Secondary | ICD-10-CM | POA: Diagnosis not present

## 2016-12-28 DIAGNOSIS — K080 Exfoliation of teeth due to systemic causes: Secondary | ICD-10-CM | POA: Diagnosis not present

## 2017-01-02 ENCOUNTER — Ambulatory Visit (INDEPENDENT_AMBULATORY_CARE_PROVIDER_SITE_OTHER): Payer: Federal, State, Local not specified - PPO | Admitting: Psychology

## 2017-01-02 DIAGNOSIS — F411 Generalized anxiety disorder: Secondary | ICD-10-CM | POA: Diagnosis not present

## 2017-01-16 ENCOUNTER — Ambulatory Visit (INDEPENDENT_AMBULATORY_CARE_PROVIDER_SITE_OTHER): Payer: Federal, State, Local not specified - PPO | Admitting: Psychology

## 2017-01-16 DIAGNOSIS — F411 Generalized anxiety disorder: Secondary | ICD-10-CM

## 2017-01-17 DIAGNOSIS — E785 Hyperlipidemia, unspecified: Secondary | ICD-10-CM | POA: Diagnosis not present

## 2017-01-17 DIAGNOSIS — K859 Acute pancreatitis without necrosis or infection, unspecified: Secondary | ICD-10-CM | POA: Diagnosis not present

## 2017-01-17 DIAGNOSIS — E119 Type 2 diabetes mellitus without complications: Secondary | ICD-10-CM | POA: Diagnosis not present

## 2017-01-17 DIAGNOSIS — I10 Essential (primary) hypertension: Secondary | ICD-10-CM | POA: Diagnosis not present

## 2017-01-17 DIAGNOSIS — E139 Other specified diabetes mellitus without complications: Secondary | ICD-10-CM | POA: Diagnosis not present

## 2017-01-30 ENCOUNTER — Ambulatory Visit (INDEPENDENT_AMBULATORY_CARE_PROVIDER_SITE_OTHER): Payer: Federal, State, Local not specified - PPO | Admitting: Psychology

## 2017-01-30 DIAGNOSIS — F411 Generalized anxiety disorder: Secondary | ICD-10-CM

## 2017-02-13 ENCOUNTER — Ambulatory Visit (INDEPENDENT_AMBULATORY_CARE_PROVIDER_SITE_OTHER): Payer: Federal, State, Local not specified - PPO | Admitting: Psychology

## 2017-02-13 DIAGNOSIS — F411 Generalized anxiety disorder: Secondary | ICD-10-CM | POA: Diagnosis not present

## 2017-02-27 ENCOUNTER — Ambulatory Visit (INDEPENDENT_AMBULATORY_CARE_PROVIDER_SITE_OTHER): Payer: Federal, State, Local not specified - PPO | Admitting: Psychology

## 2017-02-27 DIAGNOSIS — F411 Generalized anxiety disorder: Secondary | ICD-10-CM

## 2017-03-13 ENCOUNTER — Ambulatory Visit (INDEPENDENT_AMBULATORY_CARE_PROVIDER_SITE_OTHER): Payer: Federal, State, Local not specified - PPO | Admitting: Psychology

## 2017-03-13 DIAGNOSIS — F411 Generalized anxiety disorder: Secondary | ICD-10-CM | POA: Diagnosis not present

## 2017-03-27 ENCOUNTER — Ambulatory Visit (INDEPENDENT_AMBULATORY_CARE_PROVIDER_SITE_OTHER): Payer: Federal, State, Local not specified - PPO | Admitting: Psychology

## 2017-03-27 DIAGNOSIS — F411 Generalized anxiety disorder: Secondary | ICD-10-CM

## 2017-04-04 DIAGNOSIS — I10 Essential (primary) hypertension: Secondary | ICD-10-CM | POA: Diagnosis not present

## 2017-04-04 DIAGNOSIS — L723 Sebaceous cyst: Secondary | ICD-10-CM | POA: Diagnosis not present

## 2017-04-10 ENCOUNTER — Ambulatory Visit (INDEPENDENT_AMBULATORY_CARE_PROVIDER_SITE_OTHER): Payer: Federal, State, Local not specified - PPO | Admitting: Psychology

## 2017-04-10 DIAGNOSIS — F411 Generalized anxiety disorder: Secondary | ICD-10-CM

## 2017-04-24 ENCOUNTER — Ambulatory Visit (INDEPENDENT_AMBULATORY_CARE_PROVIDER_SITE_OTHER): Payer: Federal, State, Local not specified - PPO | Admitting: Psychology

## 2017-04-24 DIAGNOSIS — F411 Generalized anxiety disorder: Secondary | ICD-10-CM

## 2017-05-08 ENCOUNTER — Ambulatory Visit (INDEPENDENT_AMBULATORY_CARE_PROVIDER_SITE_OTHER): Payer: Federal, State, Local not specified - PPO | Admitting: Psychology

## 2017-05-08 DIAGNOSIS — F411 Generalized anxiety disorder: Secondary | ICD-10-CM | POA: Diagnosis not present

## 2017-05-22 ENCOUNTER — Ambulatory Visit (INDEPENDENT_AMBULATORY_CARE_PROVIDER_SITE_OTHER): Payer: Federal, State, Local not specified - PPO | Admitting: Psychology

## 2017-05-22 DIAGNOSIS — F411 Generalized anxiety disorder: Secondary | ICD-10-CM

## 2017-05-29 DIAGNOSIS — K080 Exfoliation of teeth due to systemic causes: Secondary | ICD-10-CM | POA: Diagnosis not present

## 2017-05-30 DIAGNOSIS — L7211 Pilar cyst: Secondary | ICD-10-CM | POA: Diagnosis not present

## 2017-06-05 ENCOUNTER — Ambulatory Visit (INDEPENDENT_AMBULATORY_CARE_PROVIDER_SITE_OTHER): Payer: Federal, State, Local not specified - PPO | Admitting: Psychology

## 2017-06-05 DIAGNOSIS — F411 Generalized anxiety disorder: Secondary | ICD-10-CM | POA: Diagnosis not present

## 2017-06-19 ENCOUNTER — Ambulatory Visit (INDEPENDENT_AMBULATORY_CARE_PROVIDER_SITE_OTHER): Payer: Federal, State, Local not specified - PPO | Admitting: Psychology

## 2017-06-19 DIAGNOSIS — F411 Generalized anxiety disorder: Secondary | ICD-10-CM | POA: Diagnosis not present

## 2017-07-03 ENCOUNTER — Ambulatory Visit (INDEPENDENT_AMBULATORY_CARE_PROVIDER_SITE_OTHER): Payer: Federal, State, Local not specified - PPO | Admitting: Psychology

## 2017-07-03 DIAGNOSIS — F411 Generalized anxiety disorder: Secondary | ICD-10-CM | POA: Diagnosis not present

## 2017-07-17 ENCOUNTER — Ambulatory Visit (INDEPENDENT_AMBULATORY_CARE_PROVIDER_SITE_OTHER): Payer: Federal, State, Local not specified - PPO | Admitting: Psychology

## 2017-07-17 DIAGNOSIS — F411 Generalized anxiety disorder: Secondary | ICD-10-CM

## 2017-07-18 DIAGNOSIS — E785 Hyperlipidemia, unspecified: Secondary | ICD-10-CM | POA: Diagnosis not present

## 2017-07-18 DIAGNOSIS — I10 Essential (primary) hypertension: Secondary | ICD-10-CM | POA: Diagnosis not present

## 2017-07-18 DIAGNOSIS — Z7984 Long term (current) use of oral hypoglycemic drugs: Secondary | ICD-10-CM | POA: Diagnosis not present

## 2017-07-18 DIAGNOSIS — K8511 Biliary acute pancreatitis with uninfected necrosis: Secondary | ICD-10-CM | POA: Diagnosis not present

## 2017-07-18 DIAGNOSIS — E43 Unspecified severe protein-calorie malnutrition: Secondary | ICD-10-CM | POA: Diagnosis not present

## 2017-07-18 DIAGNOSIS — E109 Type 1 diabetes mellitus without complications: Secondary | ICD-10-CM | POA: Diagnosis not present

## 2017-07-31 ENCOUNTER — Ambulatory Visit: Payer: Self-pay | Admitting: Psychology

## 2017-08-08 DIAGNOSIS — L720 Epidermal cyst: Secondary | ICD-10-CM | POA: Diagnosis not present

## 2017-08-14 ENCOUNTER — Ambulatory Visit: Payer: Self-pay | Admitting: Psychology

## 2017-08-28 ENCOUNTER — Ambulatory Visit (INDEPENDENT_AMBULATORY_CARE_PROVIDER_SITE_OTHER): Payer: Federal, State, Local not specified - PPO | Admitting: Psychology

## 2017-08-28 DIAGNOSIS — F411 Generalized anxiety disorder: Secondary | ICD-10-CM | POA: Diagnosis not present

## 2017-09-11 ENCOUNTER — Ambulatory Visit (INDEPENDENT_AMBULATORY_CARE_PROVIDER_SITE_OTHER): Payer: Federal, State, Local not specified - PPO | Admitting: Psychology

## 2017-09-11 DIAGNOSIS — F411 Generalized anxiety disorder: Secondary | ICD-10-CM | POA: Diagnosis not present

## 2017-09-25 ENCOUNTER — Ambulatory Visit (INDEPENDENT_AMBULATORY_CARE_PROVIDER_SITE_OTHER): Payer: Federal, State, Local not specified - PPO | Admitting: Psychology

## 2017-09-25 DIAGNOSIS — F411 Generalized anxiety disorder: Secondary | ICD-10-CM | POA: Diagnosis not present

## 2017-10-09 ENCOUNTER — Ambulatory Visit: Payer: Self-pay | Admitting: Psychology

## 2017-10-23 ENCOUNTER — Ambulatory Visit (INDEPENDENT_AMBULATORY_CARE_PROVIDER_SITE_OTHER): Payer: Federal, State, Local not specified - PPO | Admitting: Psychology

## 2017-10-23 DIAGNOSIS — F411 Generalized anxiety disorder: Secondary | ICD-10-CM

## 2017-11-06 ENCOUNTER — Ambulatory Visit (INDEPENDENT_AMBULATORY_CARE_PROVIDER_SITE_OTHER): Payer: Federal, State, Local not specified - PPO | Admitting: Psychology

## 2017-11-06 DIAGNOSIS — F411 Generalized anxiety disorder: Secondary | ICD-10-CM

## 2017-11-13 DIAGNOSIS — H04123 Dry eye syndrome of bilateral lacrimal glands: Secondary | ICD-10-CM | POA: Diagnosis not present

## 2017-11-13 DIAGNOSIS — Z794 Long term (current) use of insulin: Secondary | ICD-10-CM | POA: Diagnosis not present

## 2017-11-13 DIAGNOSIS — Z9889 Other specified postprocedural states: Secondary | ICD-10-CM | POA: Diagnosis not present

## 2017-11-13 DIAGNOSIS — E119 Type 2 diabetes mellitus without complications: Secondary | ICD-10-CM | POA: Diagnosis not present

## 2017-11-15 DIAGNOSIS — I10 Essential (primary) hypertension: Secondary | ICD-10-CM | POA: Diagnosis not present

## 2017-11-15 DIAGNOSIS — K859 Acute pancreatitis without necrosis or infection, unspecified: Secondary | ICD-10-CM | POA: Diagnosis not present

## 2017-11-15 DIAGNOSIS — E109 Type 1 diabetes mellitus without complications: Secondary | ICD-10-CM | POA: Diagnosis not present

## 2017-11-15 DIAGNOSIS — E785 Hyperlipidemia, unspecified: Secondary | ICD-10-CM | POA: Diagnosis not present

## 2017-11-20 ENCOUNTER — Ambulatory Visit (INDEPENDENT_AMBULATORY_CARE_PROVIDER_SITE_OTHER): Payer: Federal, State, Local not specified - PPO | Admitting: Psychology

## 2017-11-20 DIAGNOSIS — F411 Generalized anxiety disorder: Secondary | ICD-10-CM | POA: Diagnosis not present

## 2017-12-04 ENCOUNTER — Ambulatory Visit (INDEPENDENT_AMBULATORY_CARE_PROVIDER_SITE_OTHER): Payer: Federal, State, Local not specified - PPO | Admitting: Psychology

## 2017-12-04 DIAGNOSIS — F411 Generalized anxiety disorder: Secondary | ICD-10-CM

## 2017-12-05 DIAGNOSIS — I10 Essential (primary) hypertension: Secondary | ICD-10-CM | POA: Diagnosis not present

## 2017-12-18 ENCOUNTER — Ambulatory Visit (INDEPENDENT_AMBULATORY_CARE_PROVIDER_SITE_OTHER): Payer: Federal, State, Local not specified - PPO | Admitting: Psychology

## 2017-12-18 DIAGNOSIS — F411 Generalized anxiety disorder: Secondary | ICD-10-CM | POA: Diagnosis not present

## 2017-12-26 DIAGNOSIS — I10 Essential (primary) hypertension: Secondary | ICD-10-CM | POA: Diagnosis not present

## 2018-01-01 ENCOUNTER — Ambulatory Visit (INDEPENDENT_AMBULATORY_CARE_PROVIDER_SITE_OTHER): Payer: Federal, State, Local not specified - PPO | Admitting: Psychology

## 2018-01-01 DIAGNOSIS — F411 Generalized anxiety disorder: Secondary | ICD-10-CM | POA: Diagnosis not present

## 2018-01-15 ENCOUNTER — Ambulatory Visit: Payer: Federal, State, Local not specified - PPO | Admitting: Psychology

## 2018-02-12 ENCOUNTER — Ambulatory Visit: Payer: Federal, State, Local not specified - PPO | Admitting: Psychology

## 2018-02-26 ENCOUNTER — Ambulatory Visit (INDEPENDENT_AMBULATORY_CARE_PROVIDER_SITE_OTHER): Payer: Federal, State, Local not specified - PPO | Admitting: Psychology

## 2018-02-26 DIAGNOSIS — F411 Generalized anxiety disorder: Secondary | ICD-10-CM

## 2018-03-12 ENCOUNTER — Ambulatory Visit (INDEPENDENT_AMBULATORY_CARE_PROVIDER_SITE_OTHER): Payer: Federal, State, Local not specified - PPO | Admitting: Psychology

## 2018-03-12 DIAGNOSIS — F411 Generalized anxiety disorder: Secondary | ICD-10-CM

## 2018-03-19 DIAGNOSIS — E109 Type 1 diabetes mellitus without complications: Secondary | ICD-10-CM | POA: Diagnosis not present

## 2018-03-19 DIAGNOSIS — E785 Hyperlipidemia, unspecified: Secondary | ICD-10-CM | POA: Diagnosis not present

## 2018-03-19 DIAGNOSIS — Z9049 Acquired absence of other specified parts of digestive tract: Secondary | ICD-10-CM | POA: Diagnosis not present

## 2018-03-19 DIAGNOSIS — Z794 Long term (current) use of insulin: Secondary | ICD-10-CM | POA: Diagnosis not present

## 2018-03-19 DIAGNOSIS — Z8719 Personal history of other diseases of the digestive system: Secondary | ICD-10-CM | POA: Diagnosis not present

## 2018-03-19 DIAGNOSIS — I10 Essential (primary) hypertension: Secondary | ICD-10-CM | POA: Diagnosis not present

## 2018-03-19 DIAGNOSIS — E138 Other specified diabetes mellitus with unspecified complications: Secondary | ICD-10-CM | POA: Diagnosis not present

## 2018-03-26 ENCOUNTER — Ambulatory Visit (INDEPENDENT_AMBULATORY_CARE_PROVIDER_SITE_OTHER): Payer: Federal, State, Local not specified - PPO | Admitting: Psychology

## 2018-03-26 DIAGNOSIS — F411 Generalized anxiety disorder: Secondary | ICD-10-CM

## 2018-04-09 ENCOUNTER — Ambulatory Visit (INDEPENDENT_AMBULATORY_CARE_PROVIDER_SITE_OTHER): Payer: Federal, State, Local not specified - PPO | Admitting: Psychology

## 2018-04-09 DIAGNOSIS — F411 Generalized anxiety disorder: Secondary | ICD-10-CM | POA: Diagnosis not present

## 2018-04-23 ENCOUNTER — Ambulatory Visit (INDEPENDENT_AMBULATORY_CARE_PROVIDER_SITE_OTHER): Payer: Federal, State, Local not specified - PPO | Admitting: Psychology

## 2018-04-23 DIAGNOSIS — F411 Generalized anxiety disorder: Secondary | ICD-10-CM

## 2018-05-04 IMAGING — CT CT ANGIO CHEST-ABD-PELV FOR DISSECTION W/ AND WO/W CM
2 of 7 series · 14 of 46 positions shown, 16 images · IV contrast (isovue)
Comparison: Chest radiograph earlier this day.

CLINICAL DATA: Chest pressure and epigastric pain for 1 day. Nausea
and vomiting with diaphoresis. Clinical concern for dissection.

EXAM:
CT ANGIOGRAPHY CHEST, ABDOMEN AND PELVIS
TECHNIQUE: Multidetector CT imaging through the chest, abdomen and pelvis was
performed using the standard protocol during bolus administration of
intravenous contrast. Multiplanar reconstructed images and MIPs were
obtained and reviewed to evaluate the vascular anatomy.
CONTRAST:  100 cc Isovue 370 IV

[Series 6: dissection 2mm · axial · 0.95mm/px · z∈[+888,+1492]mm · 11 of 338 slices shown, 13 images]
[im 18/338  soft-tissue]
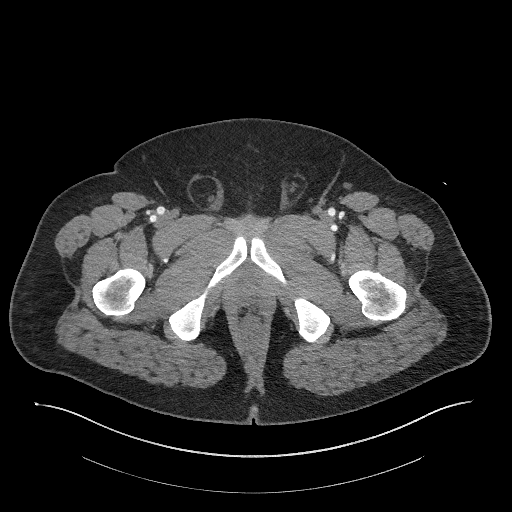
[im 18/338  bone]
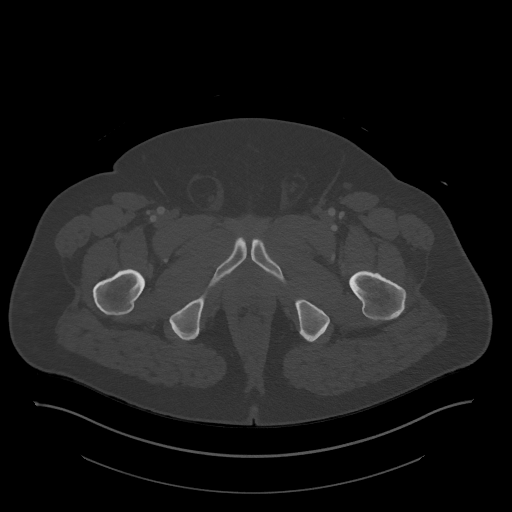
[im 54/338  soft-tissue]
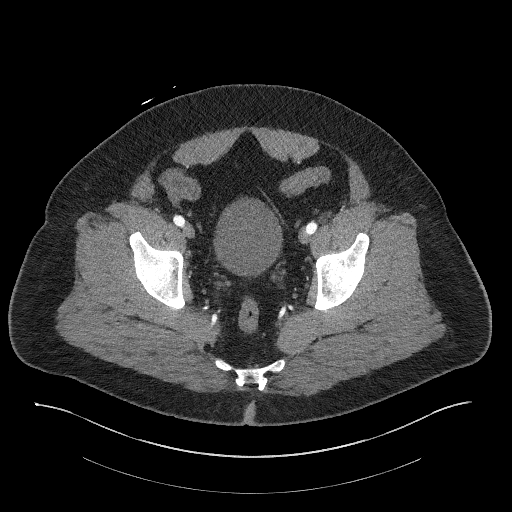
[im 89/338  soft-tissue]
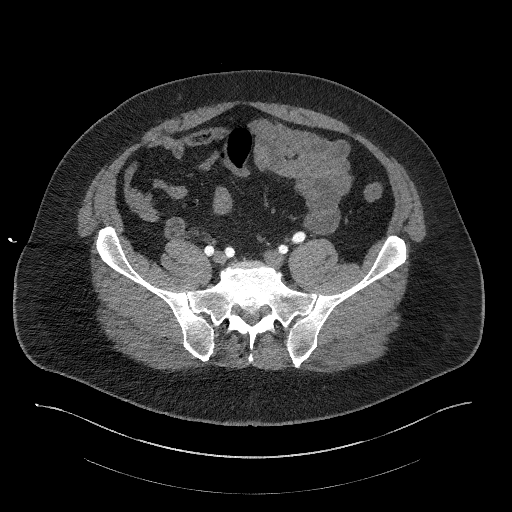
[im 107/338  soft-tissue]
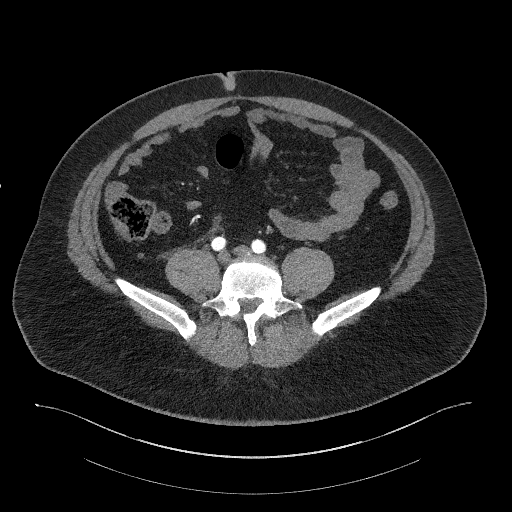
[im 142/338  soft-tissue]
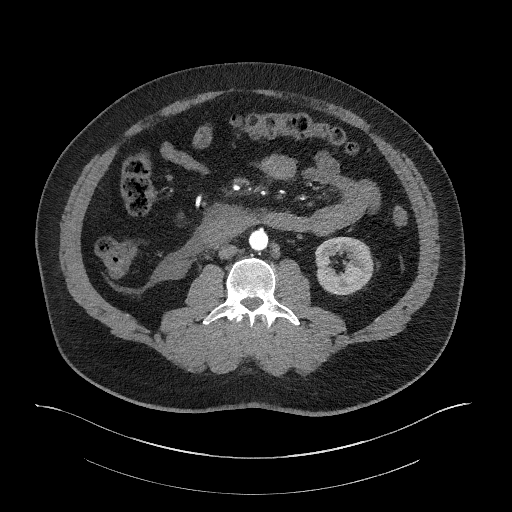
[im 178/338  soft-tissue]
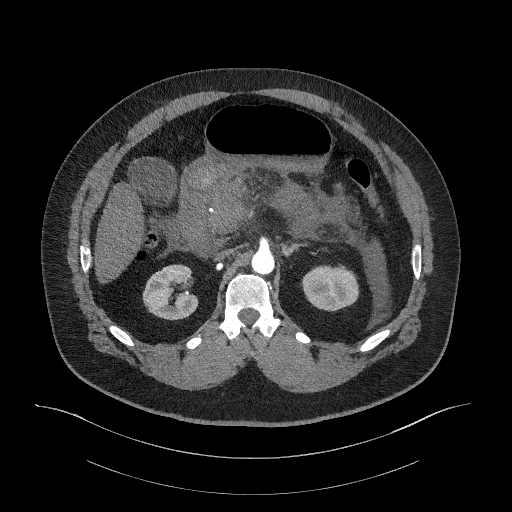
[im 196/338  soft-tissue]
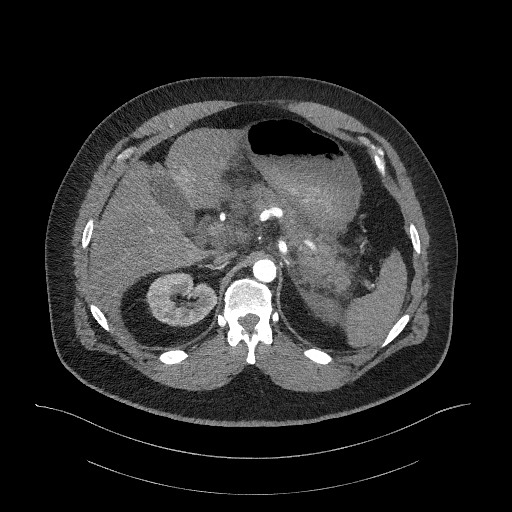
[im 231/338  soft-tissue]
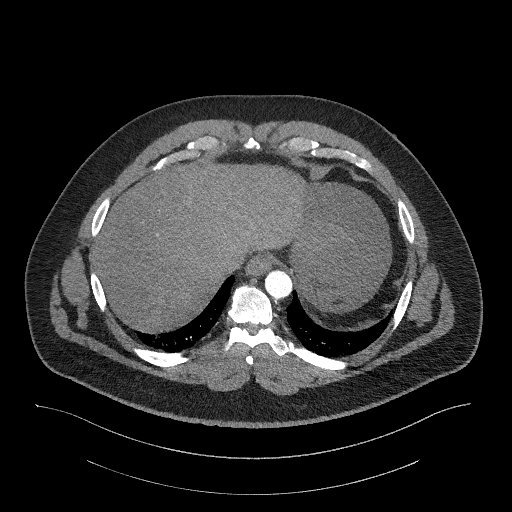
[im 249/338  soft-tissue]
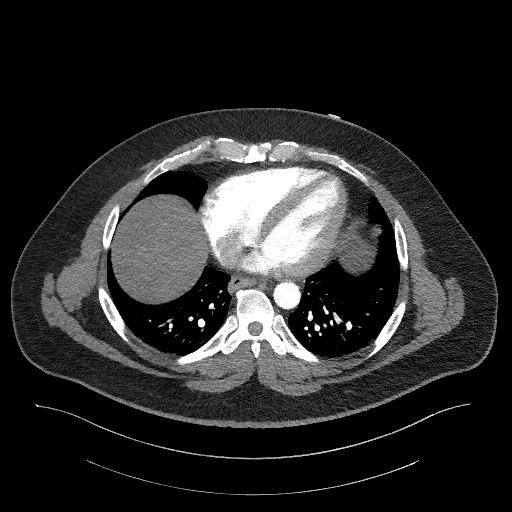
[im 249/338  bone]
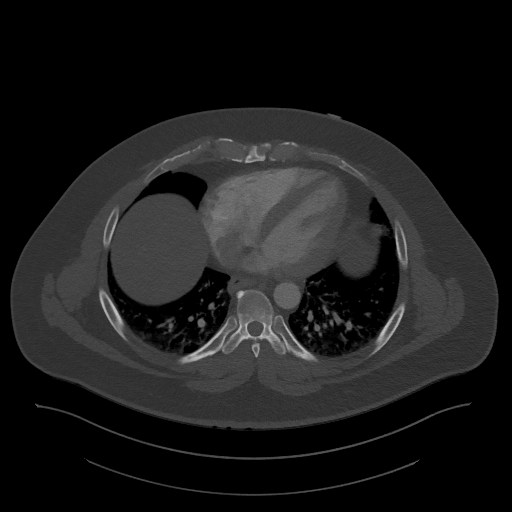
[im 284/338  soft-tissue]
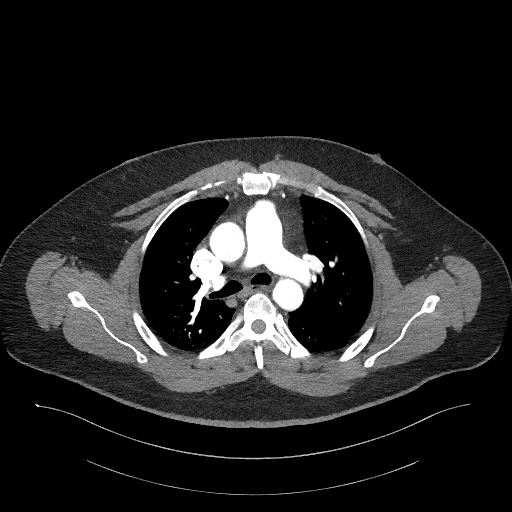
[im 320/338  soft-tissue]
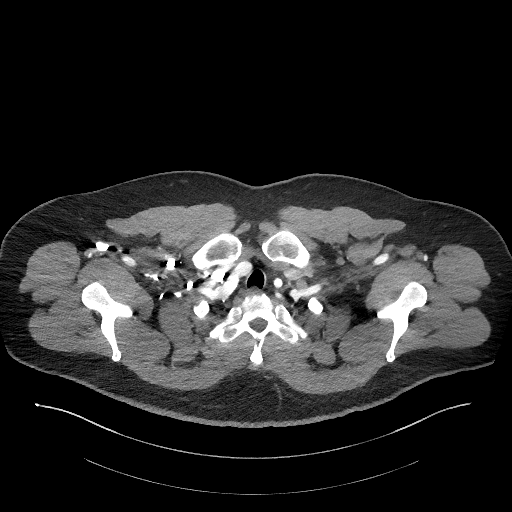

[Series 9: dissection 2mm cor · coronal · 0.86mm/px · 3 of 171 slices shown]
[im 43/171  soft-tissue]
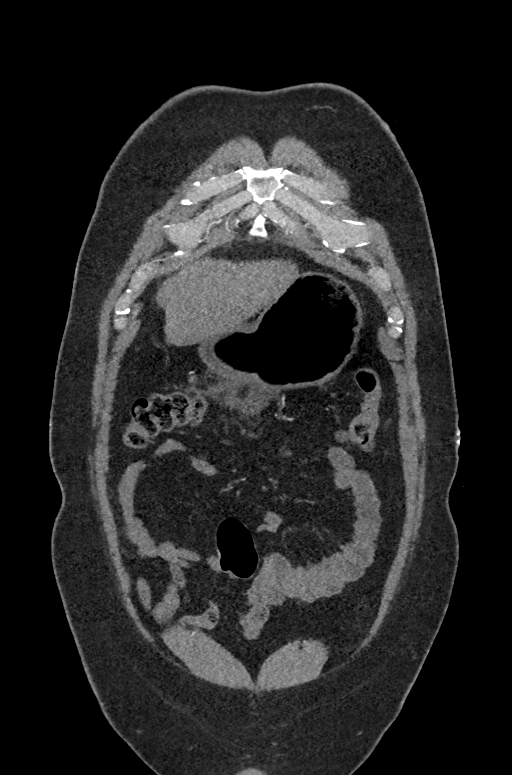
[im 86/171  soft-tissue]
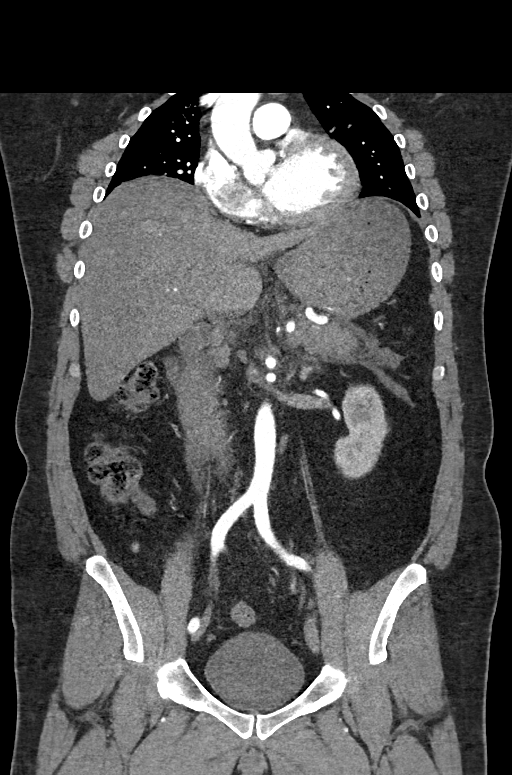
[im 128/171  soft-tissue]
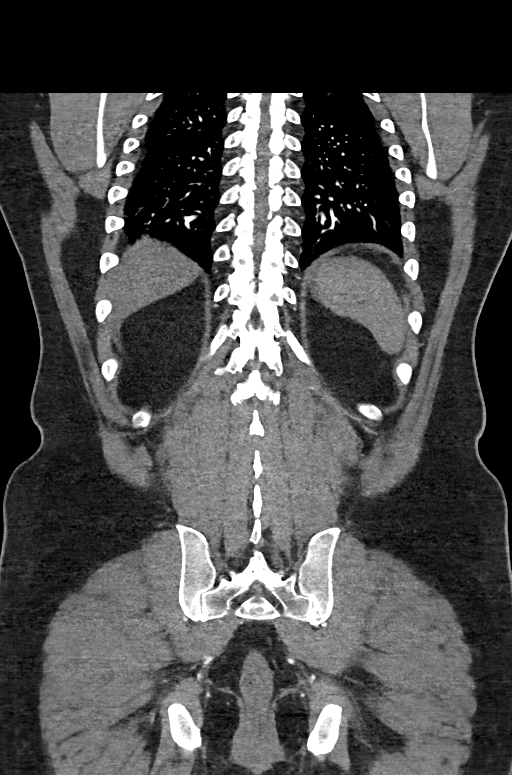

[14 of 46 positions shown; findings below may reference images not displayed]

FINDINGS: CTA CHEST FINDINGS

Normal caliber thoracic aorta without aneurysm, dissection, hematoma
or acute aortic abnormality. There is a conventional branching
pattern from the aortic arch. No significant atherosclerosis. No
filling defects in the central pulmonary arteries to suggest
pulmonary embolus. Heart is normal in size. Left hilar
calcification, sequela of prior granulomatous disease. No
pericardial effusion. No adenopathy.

Minimal dependent atelectasis in both lower lobes. 3 mm pleural
based nodule in the right lower lobe series 5, image 35 is likely a
calcified granuloma. 4 mm left upper lobe granuloma, series 5, image
15. No pleural effusion.

Mild distal esophageal thickening. Included thyroid gland is normal.

There are no acute or suspicious osseous abnormalities. Mild
degenerative disc disease in the thoracic spine.

Review of the MIP images confirms the above findings.

CTA ABDOMEN AND PELVIS FINDINGS

Normal caliber abdominal aorta without dissection or aneurysm. No
significant atherosclerosis. Celiac, superior mesenteric, and
inferior mesenteric arteries are widely patent. Single bilateral
renal arteries are patent, early branching of the left renal artery.
Bilateral common and external iliac arteries are normal.

Extensive peripancreatic fat stranding extending from the head,
through the body and tail. There is adjacent peripancreatic free
fluid, no loculated fluid collection. Moderate fluid tracks in the
right and left anterior perirenal space into the pericolic gutter.
Fluid tracks in the left upper quadrant about the medial spleen.

Decreased density of the liver consistent with steatosis. Mild
gallbladder distention, no calcified stone. Arterial phase imaging
of the spleen and adrenal glands is normal. Symmetric renal
enhancement, no hydronephrosis.

Stomach distended with ingested contents. Mild bowel wall thickening
of small bowel loops in the left upper quadrant, likely reactive
secondary to pancreatic inflammation. No small bowel distention. The
appendix is normal. Small volume colonic stool. Minimal
diverticulosis of sigmoid colon, no diverticulitis. No free air.

No evidence of retroperitoneal or mesenteric adenopathy.

In the pelvis the bladder is physiologically distended. Prostate
gland normal in size. Fat within both inguinal canals.

Scattered degenerative change throughout the lumbar spine with
degenerative disc disease at L5-S1 and facet arthropathy most
prominent at L3-L4.

Review of the MIP images confirms the above findings.
IMPRESSION: 1. Normal caliber thoracoabdominal aorta without dissection or acute
aortic abnormality.
2. Marked peripancreatic inflammation consistent with pancreatitis.
Peripancreatic fluid tracking in the anterior para renal space and
both pericolic gutters.
3. Mild gallbladder distention, no calcified stone. Hepatic
steatosis.
4. Sequela of granulomatous disease in the chest.

## 2018-05-07 ENCOUNTER — Ambulatory Visit (INDEPENDENT_AMBULATORY_CARE_PROVIDER_SITE_OTHER): Payer: Federal, State, Local not specified - PPO | Admitting: Psychology

## 2018-05-07 DIAGNOSIS — F411 Generalized anxiety disorder: Secondary | ICD-10-CM

## 2018-05-21 ENCOUNTER — Ambulatory Visit (INDEPENDENT_AMBULATORY_CARE_PROVIDER_SITE_OTHER): Payer: Federal, State, Local not specified - PPO | Admitting: Psychology

## 2018-05-21 DIAGNOSIS — F411 Generalized anxiety disorder: Secondary | ICD-10-CM

## 2018-06-04 ENCOUNTER — Ambulatory Visit (INDEPENDENT_AMBULATORY_CARE_PROVIDER_SITE_OTHER): Payer: Federal, State, Local not specified - PPO | Admitting: Psychology

## 2018-06-04 DIAGNOSIS — F411 Generalized anxiety disorder: Secondary | ICD-10-CM | POA: Diagnosis not present

## 2018-06-18 ENCOUNTER — Ambulatory Visit (INDEPENDENT_AMBULATORY_CARE_PROVIDER_SITE_OTHER): Payer: Federal, State, Local not specified - PPO | Admitting: Psychology

## 2018-06-18 DIAGNOSIS — F411 Generalized anxiety disorder: Secondary | ICD-10-CM | POA: Diagnosis not present

## 2018-06-19 DIAGNOSIS — E785 Hyperlipidemia, unspecified: Secondary | ICD-10-CM | POA: Diagnosis not present

## 2018-06-19 DIAGNOSIS — E109 Type 1 diabetes mellitus without complications: Secondary | ICD-10-CM | POA: Diagnosis not present

## 2018-06-19 DIAGNOSIS — I10 Essential (primary) hypertension: Secondary | ICD-10-CM | POA: Diagnosis not present

## 2018-06-19 DIAGNOSIS — Z794 Long term (current) use of insulin: Secondary | ICD-10-CM | POA: Diagnosis not present

## 2018-06-19 DIAGNOSIS — Z8719 Personal history of other diseases of the digestive system: Secondary | ICD-10-CM | POA: Diagnosis not present

## 2018-06-19 DIAGNOSIS — Z79899 Other long term (current) drug therapy: Secondary | ICD-10-CM | POA: Diagnosis not present

## 2018-07-02 ENCOUNTER — Ambulatory Visit (INDEPENDENT_AMBULATORY_CARE_PROVIDER_SITE_OTHER): Payer: Federal, State, Local not specified - PPO | Admitting: Psychology

## 2018-07-02 DIAGNOSIS — F411 Generalized anxiety disorder: Secondary | ICD-10-CM

## 2018-07-13 DIAGNOSIS — I10 Essential (primary) hypertension: Secondary | ICD-10-CM | POA: Diagnosis not present

## 2018-07-13 DIAGNOSIS — E7800 Pure hypercholesterolemia, unspecified: Secondary | ICD-10-CM | POA: Diagnosis not present

## 2018-07-13 DIAGNOSIS — E119 Type 2 diabetes mellitus without complications: Secondary | ICD-10-CM | POA: Diagnosis not present

## 2018-08-13 ENCOUNTER — Ambulatory Visit (INDEPENDENT_AMBULATORY_CARE_PROVIDER_SITE_OTHER): Payer: Federal, State, Local not specified - PPO | Admitting: Psychology

## 2018-08-13 DIAGNOSIS — F411 Generalized anxiety disorder: Secondary | ICD-10-CM | POA: Diagnosis not present

## 2018-08-14 DIAGNOSIS — E7800 Pure hypercholesterolemia, unspecified: Secondary | ICD-10-CM | POA: Diagnosis not present

## 2018-08-27 ENCOUNTER — Ambulatory Visit (INDEPENDENT_AMBULATORY_CARE_PROVIDER_SITE_OTHER): Payer: Federal, State, Local not specified - PPO | Admitting: Psychology

## 2018-08-27 DIAGNOSIS — F411 Generalized anxiety disorder: Secondary | ICD-10-CM

## 2018-09-10 ENCOUNTER — Ambulatory Visit (INDEPENDENT_AMBULATORY_CARE_PROVIDER_SITE_OTHER): Payer: Federal, State, Local not specified - PPO | Admitting: Psychology

## 2018-09-10 DIAGNOSIS — F411 Generalized anxiety disorder: Secondary | ICD-10-CM

## 2018-09-24 ENCOUNTER — Ambulatory Visit (INDEPENDENT_AMBULATORY_CARE_PROVIDER_SITE_OTHER): Payer: Federal, State, Local not specified - PPO | Admitting: Psychology

## 2018-09-24 DIAGNOSIS — F411 Generalized anxiety disorder: Secondary | ICD-10-CM | POA: Diagnosis not present

## 2018-10-08 ENCOUNTER — Ambulatory Visit (INDEPENDENT_AMBULATORY_CARE_PROVIDER_SITE_OTHER): Payer: Federal, State, Local not specified - PPO | Admitting: Psychology

## 2018-10-08 DIAGNOSIS — F411 Generalized anxiety disorder: Secondary | ICD-10-CM | POA: Diagnosis not present

## 2018-10-22 ENCOUNTER — Ambulatory Visit (INDEPENDENT_AMBULATORY_CARE_PROVIDER_SITE_OTHER): Payer: Federal, State, Local not specified - PPO | Admitting: Psychology

## 2018-10-22 DIAGNOSIS — F411 Generalized anxiety disorder: Secondary | ICD-10-CM

## 2018-10-29 DIAGNOSIS — Z794 Long term (current) use of insulin: Secondary | ICD-10-CM | POA: Diagnosis not present

## 2018-10-29 DIAGNOSIS — E109 Type 1 diabetes mellitus without complications: Secondary | ICD-10-CM | POA: Diagnosis not present

## 2018-10-29 DIAGNOSIS — Z7984 Long term (current) use of oral hypoglycemic drugs: Secondary | ICD-10-CM | POA: Diagnosis not present

## 2018-11-05 ENCOUNTER — Ambulatory Visit (INDEPENDENT_AMBULATORY_CARE_PROVIDER_SITE_OTHER): Payer: Federal, State, Local not specified - PPO | Admitting: Psychology

## 2018-11-05 DIAGNOSIS — F411 Generalized anxiety disorder: Secondary | ICD-10-CM

## 2018-11-19 ENCOUNTER — Ambulatory Visit: Payer: Federal, State, Local not specified - PPO | Admitting: Psychology

## 2018-12-03 ENCOUNTER — Ambulatory Visit (INDEPENDENT_AMBULATORY_CARE_PROVIDER_SITE_OTHER): Payer: Federal, State, Local not specified - PPO | Admitting: Psychology

## 2018-12-03 DIAGNOSIS — F411 Generalized anxiety disorder: Secondary | ICD-10-CM

## 2018-12-17 ENCOUNTER — Ambulatory Visit (INDEPENDENT_AMBULATORY_CARE_PROVIDER_SITE_OTHER): Payer: Federal, State, Local not specified - PPO | Admitting: Psychology

## 2018-12-17 DIAGNOSIS — F411 Generalized anxiety disorder: Secondary | ICD-10-CM

## 2018-12-31 ENCOUNTER — Ambulatory Visit (INDEPENDENT_AMBULATORY_CARE_PROVIDER_SITE_OTHER): Payer: Federal, State, Local not specified - PPO | Admitting: Psychology

## 2018-12-31 DIAGNOSIS — F411 Generalized anxiety disorder: Secondary | ICD-10-CM

## 2019-01-14 ENCOUNTER — Ambulatory Visit (INDEPENDENT_AMBULATORY_CARE_PROVIDER_SITE_OTHER): Payer: Federal, State, Local not specified - PPO | Admitting: Psychology

## 2019-01-14 DIAGNOSIS — F411 Generalized anxiety disorder: Secondary | ICD-10-CM

## 2019-01-28 ENCOUNTER — Ambulatory Visit (INDEPENDENT_AMBULATORY_CARE_PROVIDER_SITE_OTHER): Payer: Federal, State, Local not specified - PPO | Admitting: Psychology

## 2019-01-28 DIAGNOSIS — F411 Generalized anxiety disorder: Secondary | ICD-10-CM

## 2019-02-11 ENCOUNTER — Ambulatory Visit (INDEPENDENT_AMBULATORY_CARE_PROVIDER_SITE_OTHER): Payer: Federal, State, Local not specified - PPO | Admitting: Psychology

## 2019-02-11 DIAGNOSIS — F411 Generalized anxiety disorder: Secondary | ICD-10-CM | POA: Diagnosis not present

## 2019-02-25 ENCOUNTER — Ambulatory Visit: Payer: Federal, State, Local not specified - PPO | Admitting: Psychology

## 2019-03-25 ENCOUNTER — Ambulatory Visit: Payer: Federal, State, Local not specified - PPO | Admitting: Psychology

## 2019-03-27 DIAGNOSIS — E119 Type 2 diabetes mellitus without complications: Secondary | ICD-10-CM | POA: Diagnosis not present

## 2019-03-27 DIAGNOSIS — Z9889 Other specified postprocedural states: Secondary | ICD-10-CM | POA: Diagnosis not present

## 2019-03-27 DIAGNOSIS — Z794 Long term (current) use of insulin: Secondary | ICD-10-CM | POA: Diagnosis not present

## 2019-03-27 DIAGNOSIS — Z83518 Family history of other specified eye disorder: Secondary | ICD-10-CM | POA: Diagnosis not present

## 2019-04-18 ENCOUNTER — Ambulatory Visit: Payer: Federal, State, Local not specified - PPO | Admitting: Psychology

## 2019-04-22 ENCOUNTER — Ambulatory Visit (INDEPENDENT_AMBULATORY_CARE_PROVIDER_SITE_OTHER): Payer: Federal, State, Local not specified - PPO | Admitting: Psychology

## 2019-04-22 DIAGNOSIS — F411 Generalized anxiety disorder: Secondary | ICD-10-CM

## 2019-04-22 DIAGNOSIS — F3110 Bipolar disorder, current episode manic without psychotic features, unspecified: Secondary | ICD-10-CM | POA: Diagnosis not present

## 2019-05-06 ENCOUNTER — Ambulatory Visit (INDEPENDENT_AMBULATORY_CARE_PROVIDER_SITE_OTHER): Payer: Federal, State, Local not specified - PPO | Admitting: Psychology

## 2019-05-06 DIAGNOSIS — F411 Generalized anxiety disorder: Secondary | ICD-10-CM | POA: Diagnosis not present

## 2019-05-20 ENCOUNTER — Ambulatory Visit (INDEPENDENT_AMBULATORY_CARE_PROVIDER_SITE_OTHER): Payer: Federal, State, Local not specified - PPO | Admitting: Psychology

## 2019-05-20 DIAGNOSIS — F411 Generalized anxiety disorder: Secondary | ICD-10-CM

## 2019-06-03 ENCOUNTER — Ambulatory Visit: Payer: Federal, State, Local not specified - PPO | Admitting: Psychology

## 2019-06-17 ENCOUNTER — Ambulatory Visit (INDEPENDENT_AMBULATORY_CARE_PROVIDER_SITE_OTHER): Payer: Federal, State, Local not specified - PPO | Admitting: Psychology

## 2019-06-17 DIAGNOSIS — F411 Generalized anxiety disorder: Secondary | ICD-10-CM

## 2019-07-01 ENCOUNTER — Ambulatory Visit: Payer: Federal, State, Local not specified - PPO | Admitting: Psychology

## 2019-07-15 ENCOUNTER — Ambulatory Visit (INDEPENDENT_AMBULATORY_CARE_PROVIDER_SITE_OTHER): Payer: Federal, State, Local not specified - PPO | Admitting: Psychology

## 2019-07-15 DIAGNOSIS — F411 Generalized anxiety disorder: Secondary | ICD-10-CM

## 2019-08-12 ENCOUNTER — Ambulatory Visit (INDEPENDENT_AMBULATORY_CARE_PROVIDER_SITE_OTHER): Payer: Federal, State, Local not specified - PPO | Admitting: Psychology

## 2019-08-12 DIAGNOSIS — F411 Generalized anxiety disorder: Secondary | ICD-10-CM | POA: Diagnosis not present

## 2019-08-26 ENCOUNTER — Ambulatory Visit (INDEPENDENT_AMBULATORY_CARE_PROVIDER_SITE_OTHER): Payer: Federal, State, Local not specified - PPO | Admitting: Psychology

## 2019-08-26 DIAGNOSIS — F411 Generalized anxiety disorder: Secondary | ICD-10-CM | POA: Diagnosis not present

## 2019-09-09 ENCOUNTER — Ambulatory Visit (INDEPENDENT_AMBULATORY_CARE_PROVIDER_SITE_OTHER): Payer: Federal, State, Local not specified - PPO | Admitting: Psychology

## 2019-09-09 DIAGNOSIS — F411 Generalized anxiety disorder: Secondary | ICD-10-CM | POA: Diagnosis not present

## 2019-09-23 ENCOUNTER — Ambulatory Visit (INDEPENDENT_AMBULATORY_CARE_PROVIDER_SITE_OTHER): Payer: Federal, State, Local not specified - PPO | Admitting: Psychology

## 2019-09-23 DIAGNOSIS — F411 Generalized anxiety disorder: Secondary | ICD-10-CM | POA: Diagnosis not present

## 2019-10-07 ENCOUNTER — Ambulatory Visit (INDEPENDENT_AMBULATORY_CARE_PROVIDER_SITE_OTHER): Payer: Federal, State, Local not specified - PPO | Admitting: Psychology

## 2019-10-07 DIAGNOSIS — F411 Generalized anxiety disorder: Secondary | ICD-10-CM

## 2019-10-21 ENCOUNTER — Ambulatory Visit (INDEPENDENT_AMBULATORY_CARE_PROVIDER_SITE_OTHER): Payer: Federal, State, Local not specified - PPO | Admitting: Psychology

## 2019-10-21 DIAGNOSIS — F411 Generalized anxiety disorder: Secondary | ICD-10-CM | POA: Diagnosis not present

## 2019-11-04 ENCOUNTER — Ambulatory Visit: Payer: Federal, State, Local not specified - PPO | Admitting: Psychology

## 2019-11-18 ENCOUNTER — Ambulatory Visit (INDEPENDENT_AMBULATORY_CARE_PROVIDER_SITE_OTHER): Payer: Federal, State, Local not specified - PPO | Admitting: Psychology

## 2019-11-18 DIAGNOSIS — F411 Generalized anxiety disorder: Secondary | ICD-10-CM

## 2019-12-02 ENCOUNTER — Ambulatory Visit (INDEPENDENT_AMBULATORY_CARE_PROVIDER_SITE_OTHER): Payer: Federal, State, Local not specified - PPO | Admitting: Psychology

## 2019-12-02 DIAGNOSIS — F411 Generalized anxiety disorder: Secondary | ICD-10-CM

## 2019-12-16 ENCOUNTER — Ambulatory Visit (INDEPENDENT_AMBULATORY_CARE_PROVIDER_SITE_OTHER): Payer: Federal, State, Local not specified - PPO | Admitting: Psychology

## 2019-12-16 DIAGNOSIS — F411 Generalized anxiety disorder: Secondary | ICD-10-CM | POA: Diagnosis not present

## 2020-01-13 ENCOUNTER — Ambulatory Visit (INDEPENDENT_AMBULATORY_CARE_PROVIDER_SITE_OTHER): Payer: Federal, State, Local not specified - PPO | Admitting: Psychology

## 2020-01-13 DIAGNOSIS — F411 Generalized anxiety disorder: Secondary | ICD-10-CM

## 2020-01-27 ENCOUNTER — Ambulatory Visit (INDEPENDENT_AMBULATORY_CARE_PROVIDER_SITE_OTHER): Payer: Federal, State, Local not specified - PPO | Admitting: Psychology

## 2020-01-27 DIAGNOSIS — F411 Generalized anxiety disorder: Secondary | ICD-10-CM | POA: Diagnosis not present

## 2020-02-24 ENCOUNTER — Ambulatory Visit (INDEPENDENT_AMBULATORY_CARE_PROVIDER_SITE_OTHER): Payer: Federal, State, Local not specified - PPO | Admitting: Psychology

## 2020-02-24 DIAGNOSIS — F411 Generalized anxiety disorder: Secondary | ICD-10-CM | POA: Diagnosis not present

## 2020-03-09 ENCOUNTER — Ambulatory Visit (INDEPENDENT_AMBULATORY_CARE_PROVIDER_SITE_OTHER): Payer: Federal, State, Local not specified - PPO | Admitting: Psychology

## 2020-03-09 DIAGNOSIS — F411 Generalized anxiety disorder: Secondary | ICD-10-CM | POA: Diagnosis not present

## 2020-03-23 ENCOUNTER — Ambulatory Visit (INDEPENDENT_AMBULATORY_CARE_PROVIDER_SITE_OTHER): Payer: Federal, State, Local not specified - PPO | Admitting: Psychology

## 2020-03-23 DIAGNOSIS — F411 Generalized anxiety disorder: Secondary | ICD-10-CM | POA: Diagnosis not present

## 2020-04-20 ENCOUNTER — Ambulatory Visit (INDEPENDENT_AMBULATORY_CARE_PROVIDER_SITE_OTHER): Payer: Federal, State, Local not specified - PPO | Admitting: Psychology

## 2020-04-20 DIAGNOSIS — F411 Generalized anxiety disorder: Secondary | ICD-10-CM | POA: Diagnosis not present

## 2020-05-18 ENCOUNTER — Ambulatory Visit: Payer: Federal, State, Local not specified - PPO | Admitting: Psychology

## 2020-06-01 ENCOUNTER — Ambulatory Visit (INDEPENDENT_AMBULATORY_CARE_PROVIDER_SITE_OTHER): Payer: Federal, State, Local not specified - PPO | Admitting: Psychology

## 2020-06-01 DIAGNOSIS — F411 Generalized anxiety disorder: Secondary | ICD-10-CM

## 2020-06-15 ENCOUNTER — Ambulatory Visit: Payer: Federal, State, Local not specified - PPO | Admitting: Psychology

## 2020-06-16 ENCOUNTER — Ambulatory Visit (INDEPENDENT_AMBULATORY_CARE_PROVIDER_SITE_OTHER): Payer: Federal, State, Local not specified - PPO | Admitting: Psychology

## 2020-06-16 DIAGNOSIS — F411 Generalized anxiety disorder: Secondary | ICD-10-CM

## 2020-06-29 ENCOUNTER — Ambulatory Visit: Payer: Federal, State, Local not specified - PPO | Admitting: Psychology

## 2020-07-13 ENCOUNTER — Ambulatory Visit: Payer: Federal, State, Local not specified - PPO | Admitting: Psychology

## 2020-07-27 ENCOUNTER — Ambulatory Visit: Payer: Federal, State, Local not specified - PPO | Admitting: Psychology

## 2020-08-10 ENCOUNTER — Ambulatory Visit (INDEPENDENT_AMBULATORY_CARE_PROVIDER_SITE_OTHER): Payer: Federal, State, Local not specified - PPO | Admitting: Psychology

## 2020-08-10 DIAGNOSIS — F411 Generalized anxiety disorder: Secondary | ICD-10-CM

## 2020-08-24 ENCOUNTER — Ambulatory Visit (INDEPENDENT_AMBULATORY_CARE_PROVIDER_SITE_OTHER): Payer: Federal, State, Local not specified - PPO | Admitting: Psychology

## 2020-08-24 DIAGNOSIS — F411 Generalized anxiety disorder: Secondary | ICD-10-CM

## 2020-09-07 ENCOUNTER — Ambulatory Visit (INDEPENDENT_AMBULATORY_CARE_PROVIDER_SITE_OTHER): Payer: Federal, State, Local not specified - PPO | Admitting: Psychology

## 2020-09-07 DIAGNOSIS — F411 Generalized anxiety disorder: Secondary | ICD-10-CM

## 2020-09-21 ENCOUNTER — Ambulatory Visit (INDEPENDENT_AMBULATORY_CARE_PROVIDER_SITE_OTHER): Payer: Federal, State, Local not specified - PPO | Admitting: Psychology

## 2020-09-21 DIAGNOSIS — F411 Generalized anxiety disorder: Secondary | ICD-10-CM

## 2020-10-05 ENCOUNTER — Ambulatory Visit: Payer: Federal, State, Local not specified - PPO | Admitting: Psychology

## 2020-10-19 ENCOUNTER — Ambulatory Visit: Payer: Federal, State, Local not specified - PPO | Admitting: Psychology

## 2020-11-02 ENCOUNTER — Ambulatory Visit (INDEPENDENT_AMBULATORY_CARE_PROVIDER_SITE_OTHER): Payer: Federal, State, Local not specified - PPO | Admitting: Psychology

## 2020-11-02 DIAGNOSIS — F411 Generalized anxiety disorder: Secondary | ICD-10-CM | POA: Diagnosis not present

## 2020-11-16 ENCOUNTER — Ambulatory Visit (INDEPENDENT_AMBULATORY_CARE_PROVIDER_SITE_OTHER): Payer: Federal, State, Local not specified - PPO | Admitting: Psychology

## 2020-11-16 DIAGNOSIS — F411 Generalized anxiety disorder: Secondary | ICD-10-CM | POA: Diagnosis not present

## 2020-11-23 ENCOUNTER — Ambulatory Visit: Payer: Federal, State, Local not specified - PPO | Admitting: Psychology

## 2020-11-30 ENCOUNTER — Ambulatory Visit (INDEPENDENT_AMBULATORY_CARE_PROVIDER_SITE_OTHER): Payer: Federal, State, Local not specified - PPO | Admitting: Psychology

## 2020-11-30 DIAGNOSIS — F411 Generalized anxiety disorder: Secondary | ICD-10-CM

## 2020-12-14 ENCOUNTER — Ambulatory Visit (INDEPENDENT_AMBULATORY_CARE_PROVIDER_SITE_OTHER): Payer: Federal, State, Local not specified - PPO | Admitting: Psychology

## 2020-12-14 DIAGNOSIS — F411 Generalized anxiety disorder: Secondary | ICD-10-CM

## 2021-01-11 ENCOUNTER — Ambulatory Visit: Payer: Federal, State, Local not specified - PPO | Admitting: Psychology

## 2021-01-25 ENCOUNTER — Ambulatory Visit (INDEPENDENT_AMBULATORY_CARE_PROVIDER_SITE_OTHER): Payer: Federal, State, Local not specified - PPO | Admitting: Psychology

## 2021-01-25 DIAGNOSIS — F411 Generalized anxiety disorder: Secondary | ICD-10-CM | POA: Diagnosis not present

## 2021-02-22 ENCOUNTER — Ambulatory Visit (INDEPENDENT_AMBULATORY_CARE_PROVIDER_SITE_OTHER): Payer: Federal, State, Local not specified - PPO | Admitting: Psychology

## 2021-02-22 DIAGNOSIS — F411 Generalized anxiety disorder: Secondary | ICD-10-CM

## 2021-03-08 ENCOUNTER — Ambulatory Visit (INDEPENDENT_AMBULATORY_CARE_PROVIDER_SITE_OTHER): Payer: Federal, State, Local not specified - PPO | Admitting: Psychology

## 2021-03-08 DIAGNOSIS — F411 Generalized anxiety disorder: Secondary | ICD-10-CM | POA: Diagnosis not present

## 2021-03-22 ENCOUNTER — Ambulatory Visit: Payer: Federal, State, Local not specified - PPO | Admitting: Psychology

## 2021-04-19 ENCOUNTER — Ambulatory Visit (INDEPENDENT_AMBULATORY_CARE_PROVIDER_SITE_OTHER): Payer: Federal, State, Local not specified - PPO | Admitting: Psychology

## 2021-04-19 DIAGNOSIS — F411 Generalized anxiety disorder: Secondary | ICD-10-CM

## 2021-05-03 ENCOUNTER — Ambulatory Visit (INDEPENDENT_AMBULATORY_CARE_PROVIDER_SITE_OTHER): Payer: Federal, State, Local not specified - PPO | Admitting: Psychology

## 2021-05-03 DIAGNOSIS — F411 Generalized anxiety disorder: Secondary | ICD-10-CM

## 2021-05-17 ENCOUNTER — Ambulatory Visit (INDEPENDENT_AMBULATORY_CARE_PROVIDER_SITE_OTHER): Payer: Federal, State, Local not specified - PPO | Admitting: Psychology

## 2021-05-17 DIAGNOSIS — F411 Generalized anxiety disorder: Secondary | ICD-10-CM | POA: Diagnosis not present

## 2021-05-31 ENCOUNTER — Ambulatory Visit (INDEPENDENT_AMBULATORY_CARE_PROVIDER_SITE_OTHER): Payer: Federal, State, Local not specified - PPO | Admitting: Psychology

## 2021-05-31 DIAGNOSIS — F411 Generalized anxiety disorder: Secondary | ICD-10-CM | POA: Diagnosis not present

## 2021-06-14 ENCOUNTER — Ambulatory Visit: Payer: Federal, State, Local not specified - PPO | Admitting: Psychology

## 2021-06-15 ENCOUNTER — Ambulatory Visit (INDEPENDENT_AMBULATORY_CARE_PROVIDER_SITE_OTHER): Payer: Federal, State, Local not specified - PPO | Admitting: Psychology

## 2021-06-15 DIAGNOSIS — F411 Generalized anxiety disorder: Secondary | ICD-10-CM | POA: Diagnosis not present

## 2021-06-28 ENCOUNTER — Ambulatory Visit (INDEPENDENT_AMBULATORY_CARE_PROVIDER_SITE_OTHER): Payer: Federal, State, Local not specified - PPO | Admitting: Psychology

## 2021-06-28 DIAGNOSIS — F411 Generalized anxiety disorder: Secondary | ICD-10-CM

## 2021-07-12 ENCOUNTER — Ambulatory Visit (INDEPENDENT_AMBULATORY_CARE_PROVIDER_SITE_OTHER): Payer: Federal, State, Local not specified - PPO | Admitting: Psychology

## 2021-07-12 DIAGNOSIS — F411 Generalized anxiety disorder: Secondary | ICD-10-CM

## 2021-07-15 NOTE — Progress Notes (Signed)
PROGRESS NOTE  Start: 07/12/2021 08:00 AM End: 07/12/2021 08:55 AM Diagnosis Z60.0 (Phase of life problem)  311 (Other depressive disorder) [n/a]  F41.1 (Generalized anxiety disorder)  Symptoms Autonomic hyperactivity (e.g., palpitations, shortness of breath, dry mouth, trouble swallowing, nausea, diarrhea). (Status: maintained) -- No Description Entered  Depressed or irritable mood. (Status: maintained) -- No Description Entered  Excessive and/or unrealistic worry that is difficult to control occurring more days than not for at least 6 months about a number of events or activities. (Status: maintained) -- No Description Entered  Feelings of hopelessness, worthlessness, or inappropriate guilt. (Status: maintained) -- No Description Entered  Hypervigilance (e.g., feeling constantly on edge, experiencing concentration difficulties, having trouble falling or staying asleep, exhibiting a general state of irritability). (Status: maintained) -- No Description Entered  Low self-esteem. (Status: maintained) -- No Description Entered  Motor tension (e.g., restlessness, tiredness, shakiness, muscle tension). (Status: maintained) -- No Description Entered  Restlessness and feelings of lost identity and meaning due to retirement. (Status: maintained) -- No Description Entered  Medication Status na  Safety none  If Suicidal or Homicidal State Action Taken: unspecified  Current Risk: low Medications unspecified Objectives Related Problem: Develop healthy interpersonal relationships that lead to the alleviation and help prevent the relapse of depression. Description: Learn and implement behavioral strategies to overcome depression. Target Date: 2022-02-22 Frequency: Biweekly Modality: individual Progress: 40%  Related Problem: Develop healthy interpersonal relationships that lead to the alleviation and help prevent the relapse of depression. Description: Identify and replace thoughts and  beliefs that support depression. Target Date: 2022-02-22 Frequency: Biweekly Modality: individual Progress: 40%  Related Problem: Develop healthy interpersonal relationships that lead to the alleviation and help prevent the relapse of depression. Description: Identify important people in life, past and present, and describe the quality, good and poor, of those relationships. Target Date: 2022-02-22 Frequency: Biweekly Modality: individual Progress: 60%  Related Problem: Develop healthy interpersonal relationships that lead to the alleviation and help prevent the relapse of depression. Description: Verbalize insight into how past relationships may be influencing current experiences with depression. Target Date: 2022-02-22 Frequency: Biweekly Modality: individual Progress: 40%  Related Problem: Develop healthy interpersonal relationships that lead to the alleviation and help prevent the relapse of depression. Description: Learn and implement problem-solving and decision-making skills. Target Date: 2022-02-22 Frequency: Biweekly Modality: individual Progress: 0%  Related Problem: Develop healthy interpersonal relationships that lead to the alleviation and help prevent the relapse of depression. Description: Learn and implement conflict resolution skills to resolve interpersonal problems. Target Date: 2022-02-22 Frequency: Biweekly Modality: individual Progress: 0%  Related Problem: Develop healthy interpersonal relationships that lead to the alleviation and help prevent the relapse of depression. Description: Increasingly verbalize hopeful and positive statements regarding self, others, and the future. Target Date: 2022-02-22 Frequency: Biweekly Modality: individual Progress: 0%  Related Problem: Learn and implement coping skills that result in a reduction of anxiety and worry, and improved daily functioning. Description: Identify the major life conflicts from the past and  present that form the basis for present anxiety. Target Date: 2022-02-22 Frequency: Biweekly Modality: individual Progress: 50%  Related Problem: Learn and implement coping skills that result in a reduction of anxiety and worry, and improved daily functioning. Description: Maintain involvement in work, family, and social activities. Target Date: 2022-02-23 Frequency: Biweekly Modality: individual Progress: 40%  Related Problem: Learn and implement coping skills that result in a reduction of anxiety and worry, and improved daily functioning. Description: Identify, challenge, and replace biased, fearful self-talk  with positive, realistic, and empowering self-talk. Target Date: 2022-02-22 Frequency: Biweekly Modality: individual Progress: 0%  Related Problem: Learn and implement coping skills that result in a reduction of anxiety and worry, and improved daily functioning. Description: Learn and implement problem-solving strategies for realistically addressing worries. Target Date: 2022-02-22 Frequency: Biweekly Modality: individual Progress: 0%  Related Problem: Learn and implement coping skills that result in a reduction of anxiety and worry, and improved daily functioning. Description: Identify and engage in pleasant activities on a daily basis. Target Date: 2022-02-22 Frequency: Biweekly Modality: individual Progress: 0%  Related Problem: Learn and implement coping skills that result in a reduction of anxiety and worry, and improved daily functioning. Description: Learn and implement personal and interpersonal skills to reduce anxiety and improve interpersonal relationships. Target Date: 2022-02-22 Frequency: Biweekly Modality: individual Progress: 0%  Related Problem: Learn and implement coping skills that result in a reduction of anxiety and worry, and improved daily functioning. Description: Learn to accept limitations in life and commit to tolerating, rather than  avoiding, unpleasant emotions while accomplishing meaningful goals. Target Date: 2022-02-22 Frequency: Biweekly Modality: individual Progress: 0%  Related Problem: Resolve conflicted feelings and adapt to the new life circumstances. Description: Implement new activities that increase a sense of satisfaction. Target Date: 2022-02-22 Frequency: Biweekly Modality: individual Progress: 0% Planned Intervention: Develop a plan with the client to include activities that will increase his/her satisfaction, fulfill his/her values, and improve the quality of his/her life.  Related Problem: Resolve conflicted feelings and adapt to the new life circumstances. Description: Implement increased assertiveness to take control of conflicts. Target Date: 2022-02-22 Frequency: Biweekly Modality: individual Progress: 0%  Related Problem: Resolve conflicted feelings and adapt to the new life circumstances. Description: Apply problem-solving skills to current circumstances. Target Date: 2022-02-22 Frequency: Biweekly Modality: individual Progress: 0%  Related Problem: Resolve conflicted feelings and adapt to the new life circumstances. Description: Increase communication with significant others regarding current life stress factors. Target Date: 2022-02-22 Frequency: Biweekly Modality: individual Progress: 0%  Related Problem: Resolve conflicted feelings and adapt to the new life circumstances. Description: Implement changes in time and effort allocation to restore balance to life. Target Date: 2022-02-22 Frequency: Biweekly Modality: individual Progress: 0%  Related Problem: Resolve conflicted feelings and adapt to the new life circumstances. Description: Increase activities that reinforce a positive self-identity. Target Date: 2022-02-22 Frequency: Biweekly Modality: individual Progress: 0% Planned Intervention: Assist the client in clarifying his/her identity and meaning in life by listing  his/her strengths, positive traits and talents, potential ways to contribute to society, and areas of interest and ability that have not yet been developed (or assign "What's Good About Me and My Life?" from the Adult Psychotherapy Homework Planner by Bryn Gulling).  Planned Intervention: Develop an action plan with the client to increase activities that give meaning and expand his/her sense of identity at a time of transition in life phases (e.g., single to married, employed to homemaker, childless to parent, employed to retired); Engineer, building services; suggest the client read material on transitioning in life (e.g., Managing Transitions: Making the Most of Change or Transitions: Making Sense of Life's Changes by [employer]).  Related Problem: Resolve conflicted feelings and adapt to the new life circumstances. Description: Share emotional struggles related to current adjustment stress. Target Date: 2022-02-22 Frequency: Biweekly Modality: individual Progress: 0%  Client Response full compliance  Service Location Location, [address] [place], [place] [address]  Service Code cpt [phone]  Facilitate problem solving  Identify automatic thoughts  Rationally challenge  thoughts or beliefs/cognitive restructuring  Validate/empathize  Identify/label emotions  Communication analysis (IPT)  Clarify interpersonal incident (IPT)   Lifestyle change (exercise, nutrition)  Identified an insight Comments   Today I met with  Timothy [name] in remote video (WebEx) face-to-face individual psychotherapy as an accomodation to the COVID-19 Pandemic.  Distance Site: Client's Home Orginating Site: Dr Jannifer Franklin Remote Office Consent: Obtained verbal consent to transmit  session remotely    Timothy [name] reports that he had another situation with his supervisor.  We d/p the situation, what part he was responsible for, corrective measures and next steps.  Timothy [name]'s wife is scheduled for surgery over the holidays.   We d/ and p/s ways for him to provide support and assistance.  Timothy [name] was reminded of vacation schedule, date of  his next appointment and how to reach me in the case of an emergency.  Progress: Patient is experiencing an increased level of depression aeb low to moderate level He has improved some in the area of confronting difficult situations/subject which are normally his main anxiety triggers.  Royetta Crochet, Ph.D __________________________________________________________

## 2021-08-09 ENCOUNTER — Ambulatory Visit (INDEPENDENT_AMBULATORY_CARE_PROVIDER_SITE_OTHER): Payer: Federal, State, Local not specified - PPO | Admitting: Psychology

## 2021-08-09 DIAGNOSIS — F411 Generalized anxiety disorder: Secondary | ICD-10-CM | POA: Diagnosis not present

## 2021-08-09 NOTE — Progress Notes (Signed)
PROGRESS NOTE  Start: 08/09/2021 08:00 AM End: 08/09/2020 08:55 AM Diagnosis Z60.0 (Phase of life problem)  311 (Other depressive disorder)  F41.1 (Generalized anxiety disorder)  Symptoms Autonomic hyperactivity (e.g., palpitations, shortness of breath, dry mouth, trouble swallowing, nausea, diarrhea). (Status: maintained) -- No Description Entered  Depressed or irritable mood. (Status: maintained) -- No Description Entered  Excessive and/or unrealistic worry that is difficult to control occurring more days than not for at least 6 months about a number of events or activities. (Status: maintained) -- No Description Entered  Feelings of hopelessness, worthlessness, or inappropriate guilt. (Status: maintained) -- No Description Entered  Hypervigilance (e.g., feeling constantly on edge, experiencing concentration difficulties, having trouble falling or staying asleep, exhibiting a general state of irritability). (Status: maintained) -- No Description Entered  Low self-esteem. (Status: maintained) -- No Description Entered  Motor tension (e.g., restlessness, tiredness, shakiness, muscle tension). (Status: maintained) -- No Description Entered  Restlessness and feelings of lost identity and meaning due to retirement. (Status: maintained) -- No Description Entered  Medication Status na  Safety none  If Suicidal or Homicidal State Action Taken: unspecified  Current Risk: low Medications unspecified Objectives Related Problem: Develop healthy interpersonal relationships that lead to the alleviation and help prevent the relapse of depression. Description: Learn and implement behavioral strategies to overcome depression. Target Date: 2022-02-22 Frequency: Biweekly Modality: individual Progress: 40%  Related Problem: Develop healthy interpersonal relationships that lead to the alleviation and help prevent the relapse of depression. Description: Identify and replace thoughts and beliefs that  support depression. Target Date: 2022-02-22 Frequency: Biweekly Modality: individual Progress: 40%  Related Problem: Develop healthy interpersonal relationships that lead to the alleviation and help prevent the relapse of depression. Description: Identify important people in life, past and present, and describe the quality, good and poor, of those relationships. Target Date: 2022-02-22 Frequency: Biweekly Modality: individual Progress: 60%  Related Problem: Develop healthy interpersonal relationships that lead to the alleviation and help prevent the relapse of depression. Description: Verbalize insight into how past relationships may be influencing current experiences with depression. Target Date: 2022-02-22 Frequency: Biweekly Modality: individual Progress: 40%  Related Problem: Develop healthy interpersonal relationships that lead to the alleviation and help prevent the relapse of depression. Description: Learn and implement problem-solving and decision-making skills. Target Date: 2022-02-22 Frequency: Biweekly Modality: individual Progress: 0%  Related Problem: Develop healthy interpersonal relationships that lead to the alleviation and help prevent the relapse of depression. Description: Learn and implement conflict resolution skills to resolve interpersonal problems. Target Date: 2022-02-22 Frequency: Biweekly Modality: individual Progress: 0%  Related Problem: Develop healthy interpersonal relationships that lead to the alleviation and help prevent the relapse of depression. Description: Increasingly verbalize hopeful and positive statements regarding self, others, and the future. Target Date: 2022-02-22 Frequency: Biweekly Modality: individual Progress: 0%  Related Problem: Learn and implement coping skills that result in a reduction of anxiety and worry, and improved daily functioning. Description: Identify the major life conflicts from the past and present that form  the basis for present anxiety. Target Date: 2022-02-22 Frequency: Biweekly Modality: individual Progress: 50%  Related Problem: Learn and implement coping skills that result in a reduction of anxiety and worry, and improved daily functioning. Description: Maintain involvement in work, family, and social activities. Target Date: 2022-02-23 Frequency: Biweekly Modality: individual Progress: 40%  Related Problem: Learn and implement coping skills that result in a reduction of anxiety and worry, and improved daily functioning. Description: Identify, challenge, and replace biased, fearful self-talk with positive, realistic,  empowering self-talk. °Target Date: 2022-02-22 °Frequency: Biweekly °Modality: individual °Progress: 0% ° °Related Problem: Learn and implement coping skills that result in a reduction of anxiety and worry, and improved daily functioning. °Description: Learn and implement problem-solving strategies for realistically addressing worries. °Target Date: 2022-02-22 °Frequency: Biweekly °Modality: individual °Progress: 0% ° °Related Problem: Learn and implement coping skills that result in a reduction of anxiety and worry, and improved daily functioning. °Description: Identify and engage in pleasant activities on a daily basis. °Target Date: 2022-02-22 °Frequency: Biweekly °Modality: individual °Progress: 0% ° °Related Problem: Learn and implement coping skills that result in a reduction of anxiety and worry, and improved daily functioning. °Description: Learn and implement personal and interpersonal skills to reduce anxiety and improve interpersonal relationships. °Target Date: 2022-02-22 °Frequency: Biweekly °Modality: individual °Progress: 0% ° °Related Problem: Learn and implement coping skills that result in a reduction of anxiety and worry, and improved daily functioning. °Description: Learn to accept limitations in life and commit to tolerating, rather than avoiding, unpleasant  emotions while accomplishing meaningful goals. °Target Date: 2022-02-22 °Frequency: Biweekly °Modality: individual °Progress: 0% ° °Related Problem: Resolve conflicted feelings and adapt to the new life circumstances. °Description: Implement new activities that increase a sense of satisfaction. °Target Date: 2022-02-22 °Frequency: Biweekly °Modality: individual °Progress: 0% °Planned Intervention: Develop a plan with the client to include activities that will increase his/her satisfaction, fulfill his/her values, and improve the quality of his/her life.  °Related Problem: Resolve conflicted feelings and adapt to the new life circumstances. °Description: Implement increased assertiveness to take control of conflicts. °Target Date: 2022-02-22 °Frequency: Biweekly °Modality: individual °Progress: 0% ° °Related Problem: Resolve conflicted feelings and adapt to the new life circumstances. °Description: Apply problem-solving skills to current circumstances. °Target Date: 2022-02-22 °Frequency: Biweekly °Modality: individual °Progress: 0% ° °Related Problem: Resolve conflicted feelings and adapt to the new life circumstances. °Description: Increase communication with significant others regarding current life stress factors. °Target Date: 2022-02-22 °Frequency: Biweekly °Modality: individual °Progress: 0% ° °Related Problem: Resolve conflicted feelings and adapt to the new life circumstances. °Description: Implement changes in time and effort allocation to restore balance to life. °Target Date: 2022-02-22 °Frequency: Biweekly °Modality: individual °Progress: 0% ° °Related Problem: Resolve conflicted feelings and adapt to the new life circumstances. °Description: Increase activities that reinforce a positive self-identity. °Target Date: 2022-02-22 °Frequency: Biweekly °Modality: individual °Progress: 0% °Planned Intervention: Assist the client in clarifying his/her identity and meaning in life by listing his/her strengths,  positive traits and talents, potential ways to contribute to society, and areas of interest and ability that have not yet been developed (or assign "What's Good About Me and My Life?" from the Adult Psychotherapy Homework Planner by [employer]).  °Planned Intervention: Develop an action plan with the client to increase activities that give meaning and expand his/her sense of identity at a time of transition in life phases (e.g., single to married, employed to homemaker, childless to parent, employed to retired); monitor implementation; suggest the client read material on transitioning in life (e.g., Managing Transitions: Making the Most of Change or Transitions: Making Sense of Life's Changes by [employer]).  °Related Problem: Resolve conflicted feelings and adapt to the new life circumstances. °Description: Share emotional struggles related to current adjustment stress. °Target Date: 2022-02-22 °Frequency: Biweekly °Modality: individual °Progress: 0% ° °Client Response °full compliance  °Service Location °Location, [address] [place], [place] [address]  °Service Code °cpt 90837  °Facilitate problem solving  °Identify automatic thoughts  °Rationally challenge thoughts or beliefs/cognitive restructuring  °  Validate/empathize  °Identify/label emotions  °Communication analysis (IPT)  °Clarify interpersonal incident (IPT)  °Communication Skills  °  °Identified an insight °Comments  ° °Today I met with  Perri Akerley in remote video (WebEx) face-to-face individual psychotherapy as an accomodation to the COVID-19 Pandemic. ° °Distance Site: Client's Home °Orginating Site: Dr 's Remote Office °Consent: Obtained verbal consent to transmit  session remotely   ° °Dalessandro reports that work has been a trial.  He share recent experiences at work that have been a source of stress. He is learning that this is temporary and that all he has to do is get through the next several months.  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 ° °Tyrek states that he had an encounter with his daughter that he asked for help with.  We d/e/p a pattern of behavior that he finds very frustrating.  We were able to make connections between home and work, the present and the past.  I noted that things would improve when he learns some new skills and learns to implement them.  I provided some psychoeducation and taught Jaasiel listening and communication skills. ° ° °Progress: Patient is experiencing an increased level of depression aeb low to moderate level He has improved some in the area of confronting difficult situations/subject which are normally his main anxiety triggers. ° ° ° Ann , Ph.D __________________________________________________________  °

## 2021-08-23 ENCOUNTER — Ambulatory Visit (INDEPENDENT_AMBULATORY_CARE_PROVIDER_SITE_OTHER): Payer: Federal, State, Local not specified - PPO | Admitting: Psychology

## 2021-08-23 DIAGNOSIS — F411 Generalized anxiety disorder: Secondary | ICD-10-CM | POA: Diagnosis not present

## 2021-08-23 NOTE — Progress Notes (Signed)
PROGRESS NOTE  Start: 08/23/2021 08:00 AM End: 08/23/2020 08:55 AM Diagnosis Z60.0 (Phase of life problem)  311 (Other depressive disorder)  F41.1 (Generalized anxiety disorder)  Symptoms Autonomic hyperactivity (e.g., palpitations, shortness of breath, dry mouth, trouble swallowing, nausea, diarrhea). (Status: maintained) -- No Description Entered  Depressed or irritable mood. (Status: maintained) -- No Description Entered  Excessive and/or unrealistic worry that is difficult to control occurring more days than not for at least 6 months about a number of events or activities. (Status: maintained) -- No Description Entered  Feelings of hopelessness, worthlessness, or inappropriate guilt. (Status: maintained) -- No Description Entered  Hypervigilance (e.g., feeling constantly on edge, experiencing concentration difficulties, having trouble falling or staying asleep, exhibiting a general state of irritability). (Status: maintained) -- No Description Entered  Low self-esteem. (Status: maintained) -- No Description Entered  Motor tension (e.g., restlessness, tiredness, shakiness, muscle tension). (Status: maintained) -- No Description Entered  Restlessness and feelings of lost identity and meaning due to retirement. (Status: maintained) -- No Description Entered  Medication Status na  Safety none  If Suicidal or Homicidal State Action Taken: unspecified  Current Risk: low Medications unspecified Objectives Related Problem: Develop healthy interpersonal relationships that lead to the alleviation and help prevent the relapse of depression. Description: Learn and implement behavioral strategies to overcome depression. Target Date: 2022-02-22 Frequency: Biweekly Modality: individual Progress: 40%  Related Problem: Develop healthy interpersonal relationships that lead to the alleviation and help prevent the relapse of depression. Description: Identify and replace thoughts and beliefs that  support depression. Target Date: 2022-02-22 Frequency: Biweekly Modality: individual Progress: 40%  Related Problem: Develop healthy interpersonal relationships that lead to the alleviation and help prevent the relapse of depression. Description: Identify important people in life, past and present, and describe the quality, good and poor, of those relationships. Target Date: 2022-02-22 Frequency: Biweekly Modality: individual Progress: 60%  Related Problem: Develop healthy interpersonal relationships that lead to the alleviation and help prevent the relapse of depression. Description: Verbalize insight into how past relationships may be influencing current experiences with depression. Target Date: 2022-02-22 Frequency: Biweekly Modality: individual Progress: 40%  Related Problem: Develop healthy interpersonal relationships that lead to the alleviation and help prevent the relapse of depression. Description: Learn and implement problem-solving and decision-making skills. Target Date: 2022-02-22 Frequency: Biweekly Modality: individual Progress: 0%  Related Problem: Develop healthy interpersonal relationships that lead to the alleviation and help prevent the relapse of depression. Description: Learn and implement conflict resolution skills to resolve interpersonal problems. Target Date: 2022-02-22 Frequency: Biweekly Modality: individual Progress: 0%  Related Problem: Develop healthy interpersonal relationships that lead to the alleviation and help prevent the relapse of depression. Description: Increasingly verbalize hopeful and positive statements regarding self, others, and the future. Target Date: 2022-02-22 Frequency: Biweekly Modality: individual Progress: 0%  Related Problem: Learn and implement coping skills that result in a reduction of anxiety and worry, and improved daily functioning. Description: Identify the major life conflicts from the past and present that form  the basis for present anxiety. Target Date: 2022-02-22 Frequency: Biweekly Modality: individual Progress: 50%  Related Problem: Learn and implement coping skills that result in a reduction of anxiety and worry, and improved daily functioning. Description: Maintain involvement in work, family, and social activities. Target Date: 2022-02-23 Frequency: Biweekly Modality: individual Progress: 40%  Related Problem: Learn and implement coping skills that result in a reduction of anxiety and worry, and improved daily functioning. Description: Identify, challenge, and replace biased, fearful self-talk with positive, realistic, and  empowering self-talk. Target Date: 2022-02-22 Frequency: Biweekly Modality: individual Progress: 0%  Related Problem: Learn and implement coping skills that result in a reduction of anxiety and worry, and improved daily functioning. Description: Learn and implement problem-solving strategies for realistically addressing worries. Target Date: 2022-02-22 Frequency: Biweekly Modality: individual Progress: 0%  Related Problem: Learn and implement coping skills that result in a reduction of anxiety and worry, and improved daily functioning. Description: Identify and engage in pleasant activities on a daily basis. Target Date: 2022-02-22 Frequency: Biweekly Modality: individual Progress: 0%  Related Problem: Learn and implement coping skills that result in a reduction of anxiety and worry, and improved daily functioning. Description: Learn and implement personal and interpersonal skills to reduce anxiety and improve interpersonal relationships. Target Date: 2022-02-22 Frequency: Biweekly Modality: individual Progress: 0%  Related Problem: Learn and implement coping skills that result in a reduction of anxiety and worry, and improved daily functioning. Description: Learn to accept limitations in life and commit to tolerating, rather than avoiding, unpleasant  emotions while accomplishing meaningful goals. Target Date: 2022-02-22 Frequency: Biweekly Modality: individual Progress: 0%  Related Problem: Resolve conflicted feelings and adapt to the new life circumstances. Description: Implement new activities that increase a sense of satisfaction. Target Date: 2022-02-22 Frequency: Biweekly Modality: individual Progress: 0% Planned Intervention: Develop a plan with the client to include activities that will increase his/her satisfaction, fulfill his/her values, and improve the quality of his/her life.  Related Problem: Resolve conflicted feelings and adapt to the new life circumstances. Description: Implement increased assertiveness to take control of conflicts. Target Date: 2022-02-22 Frequency: Biweekly Modality: individual Progress: 0%  Related Problem: Resolve conflicted feelings and adapt to the new life circumstances. Description: Apply problem-solving skills to current circumstances. Target Date: 2022-02-22 Frequency: Biweekly Modality: individual Progress: 0%  Related Problem: Resolve conflicted feelings and adapt to the new life circumstances. Description: Increase communication with significant others regarding current life stress factors. Target Date: 2022-02-22 Frequency: Biweekly Modality: individual Progress: 0%  Related Problem: Resolve conflicted feelings and adapt to the new life circumstances. Description: Implement changes in time and effort allocation to restore balance to life. Target Date: 2022-02-22 Frequency: Biweekly Modality: individual Progress: 0%  Related Problem: Resolve conflicted feelings and adapt to the new life circumstances. Description: Increase activities that reinforce a positive self-identity. Target Date: 2022-02-22 Frequency: Biweekly Modality: individual Progress: 0% Planned Intervention: Assist the client in clarifying his/her identity and meaning in life by listing his/her strengths,  positive traits and talents, potential ways to contribute to society, and areas of interest and ability that have not yet been developed (or assign "What's Good About Me and My Life?" from the Adult Psychotherapy Homework Planner by Bryn Gulling).  Planned Intervention: Develop an action plan with the client to increase activities that give meaning and expand his/her sense of identity at a time of transition in life phases (e.g., single to married, employed to homemaker, childless to parent, employed to retired); Engineer, building services; suggest the client read material on transitioning in life (e.g., Managing Transitions: Making the Most of Change or Transitions: Making Sense of Life's Changes by [employer]).  Related Problem: Resolve conflicted feelings and adapt to the new life circumstances. Description: Share emotional struggles related to current adjustment stress. Target Date: 2022-02-22 Frequency: Biweekly Modality: individual Progress: 0%  Client Response full compliance  Service Location Location, [address] [place], [place] [address]  Service Code cpt [phone]) Facilitate problem solving  Identify automatic thoughts  Rationally challenge thoughts or beliefs/cognitive restructuring  Validate/empathize  Identify/label emotions  Communication analysis (IPT)  Clarify interpersonal incident (IPT)  Communication Skills    Identified an insight Comments   Today I met with  Davan Nawabi in remote video (WebEx) face-to-face individual psychotherapy as an accomodation to the COVID-19 Pandemic.  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  She asked him to research his best options.  Keyron is looking into private foundations versus state or [employer].  We d/ how Matthews is already feeling a bit less  anxious about work.  We d/e/p next steps that would help him find hi sway forward.  I encouraged him to keep a journal where he could document lists, steps and his thoughts throughout this process. I noted that having guidance and a clearer direction and path had made a big difference for his anxiety.     Progress: Patient is experiencing an increased level of depression aeb low to moderate level He has improved some in the area of confronting difficult situations/subject which are normally his main anxiety triggers.   Royetta Crochet, PhD

## 2021-09-06 ENCOUNTER — Ambulatory Visit (INDEPENDENT_AMBULATORY_CARE_PROVIDER_SITE_OTHER): Payer: Federal, State, Local not specified - PPO | Admitting: Psychology

## 2021-09-06 DIAGNOSIS — F411 Generalized anxiety disorder: Secondary | ICD-10-CM

## 2021-09-06 NOTE — Progress Notes (Signed)
PROGRESS NOTE  Start: 09/06/2021 08:00 AM End: 09/06/2021 08:55 AM Diagnosis Z60.0 (Phase of life problem)  311 (Other depressive disorder)  F41.1 (Generalized anxiety disorder)  Symptoms Autonomic hyperactivity (e.g., palpitations, shortness of breath, dry mouth, trouble swallowing, nausea, diarrhea). (Status: maintained) -- No Description Entered  Depressed or irritable mood. (Status: maintained) -- No Description Entered  Excessive and/or unrealistic worry that is difficult to control occurring more days than not for at least 6 months about a number of events or activities. (Status: maintained) -- No Description Entered  Feelings of hopelessness, worthlessness, or inappropriate guilt. (Status: maintained) -- No Description Entered  Hypervigilance (e.g., feeling constantly on edge, experiencing concentration difficulties, having trouble falling or staying asleep, exhibiting a general state of irritability). (Status: maintained) -- No Description Entered  Low self-esteem. (Status: maintained) -- No Description Entered  Motor tension (e.g., restlessness, tiredness, shakiness, muscle tension). (Status: maintained) -- No Description Entered  Restlessness and feelings of lost identity and meaning due to retirement. (Status: maintained) -- No Description Entered  Medication Status na  Safety none  If Suicidal or Homicidal State Action Taken: unspecified  Current Risk: low Medications unspecified Objectives Related Problem: Develop healthy interpersonal relationships that lead to the alleviation and help prevent the relapse of depression. Description: Learn and implement behavioral strategies to overcome depression. Target Date: 2022-02-22 Frequency: Biweekly Modality: individual Progress: 40%  Related Problem: Develop healthy interpersonal relationships that lead to the alleviation and help prevent the relapse of depression. Description: Identify and replace thoughts and beliefs that  support depression. Target Date: 2022-02-22 Frequency: Biweekly Modality: individual Progress: 40%  Related Problem: Develop healthy interpersonal relationships that lead to the alleviation and help prevent the relapse of depression. Description: Identify important people in life, past and present, and describe the quality, good and poor, of those relationships. Target Date: 2022-02-22 Frequency: Biweekly Modality: individual Progress: 60%  Related Problem: Develop healthy interpersonal relationships that lead to the alleviation and help prevent the relapse of depression. Description: Verbalize insight into how past relationships may be influencing current experiences with depression. Target Date: 2022-02-22 Frequency: Biweekly Modality: individual Progress: 40%  Related Problem: Develop healthy interpersonal relationships that lead to the alleviation and help prevent the relapse of depression. Description: Learn and implement problem-solving and decision-making skills. Target Date: 2022-02-22 Frequency: Biweekly Modality: individual Progress: 0%  Related Problem: Develop healthy interpersonal relationships that lead to the alleviation and help prevent the relapse of depression. Description: Learn and implement conflict resolution skills to resolve interpersonal problems. Target Date: 2022-02-22 Frequency: Biweekly Modality: individual Progress: 0%  Related Problem: Develop healthy interpersonal relationships that lead to the alleviation and help prevent the relapse of depression. Description: Increasingly verbalize hopeful and positive statements regarding self, others, and the future. Target Date: 2022-02-22 Frequency: Biweekly Modality: individual Progress: 0%  Related Problem: Learn and implement coping skills that result in a reduction of anxiety and worry, and improved daily functioning. Description: Identify the major life conflicts from the past and present that form  the basis for present anxiety. Target Date: 2022-02-22 Frequency: Biweekly Modality: individual Progress: 50%  Related Problem: Learn and implement coping skills that result in a reduction of anxiety and worry, and improved daily functioning. Description: Maintain involvement in work, family, and social activities. Target Date: 2022-02-23 Frequency: Biweekly Modality: individual Progress: 40%  Related Problem: Learn and implement coping skills that result in a reduction of anxiety and worry, and improved daily functioning. Description: Identify, challenge, and replace biased, fearful self-talk with positive, realistic, and  empowering self-talk. Target Date: 2022-02-22 Frequency: Biweekly Modality: individual Progress: 0%  Related Problem: Learn and implement coping skills that result in a reduction of anxiety and worry, and improved daily functioning. Description: Learn and implement problem-solving strategies for realistically addressing worries. Target Date: 2022-02-22 Frequency: Biweekly Modality: individual Progress: 0%  Related Problem: Learn and implement coping skills that result in a reduction of anxiety and worry, and improved daily functioning. Description: Identify and engage in pleasant activities on a daily basis. Target Date: 2022-02-22 Frequency: Biweekly Modality: individual Progress: 0%  Related Problem: Learn and implement coping skills that result in a reduction of anxiety and worry, and improved daily functioning. Description: Learn and implement personal and interpersonal skills to reduce anxiety and improve interpersonal relationships. Target Date: 2022-02-22 Frequency: Biweekly Modality: individual Progress: 0%  Related Problem: Learn and implement coping skills that result in a reduction of anxiety and worry, and improved daily functioning. Description: Learn to accept limitations in life and commit to tolerating, rather than avoiding, unpleasant  emotions while accomplishing meaningful goals. Target Date: 2022-02-22 Frequency: Biweekly Modality: individual Progress: 0%  Related Problem: Resolve conflicted feelings and adapt to the new life circumstances. Description: Implement new activities that increase a sense of satisfaction. Target Date: 2022-02-22 Frequency: Biweekly Modality: individual Progress: 0% Planned Intervention: Develop a plan with the client to include activities that will increase his/her satisfaction, fulfill his/her values, and improve the quality of his/her life.  Related Problem: Resolve conflicted feelings and adapt to the new life circumstances. Description: Implement increased assertiveness to take control of conflicts. Target Date: 2022-02-22 Frequency: Biweekly Modality: individual Progress: 0%  Related Problem: Resolve conflicted feelings and adapt to the new life circumstances. Description: Apply problem-solving skills to current circumstances. Target Date: 2022-02-22 Frequency: Biweekly Modality: individual Progress: 0%  Related Problem: Resolve conflicted feelings and adapt to the new life circumstances. Description: Increase communication with significant others regarding current life stress factors. Target Date: 2022-02-22 Frequency: Biweekly Modality: individual Progress: 0%  Related Problem: Resolve conflicted feelings and adapt to the new life circumstances. Description: Implement changes in time and effort allocation to restore balance to life. Target Date: 2022-02-22 Frequency: Biweekly Modality: individual Progress: 0%  Related Problem: Resolve conflicted feelings and adapt to the new life circumstances. Description: Increase activities that reinforce a positive self-identity. Target Date: 2022-02-22 Frequency: Biweekly Modality: individual Progress: 0% Planned Intervention: Assist the client in clarifying his/her identity and meaning in life by listing his/her strengths,  positive traits and talents, potential ways to contribute to society, and areas of interest and ability that have not yet been developed (or assign "What's Good About Me and My Life?" from the Adult Psychotherapy Homework Planner by Bryn Gulling).  Planned Intervention: Develop an action plan with the client to increase activities that give meaning and expand his/her sense of identity at a time of transition in life phases (e.g., single to married, employed to homemaker, childless to parent, employed to retired); Engineer, building services; suggest the client read material on transitioning in life (e.g., Managing Transitions: Making the Most of Change or Transitions: Making Sense of Life's Changes by [employer]).  Related Problem: Resolve conflicted feelings and adapt to the new life circumstances. Description: Share emotional struggles related to current adjustment stress. Target Date: 2022-02-22 Frequency: Biweekly Modality: individual Progress: 0%  Client Response full compliance  Service Location Location, [address] [place], [place] [address]  Service Code cpt [phone]) Facilitate problem solving  Identify automatic thoughts  Rationally challenge thoughts or beliefs/cognitive restructuring  Validate/empathize  Identify/label emotions  Communication analysis (IPT)  Clarify interpersonal incident (IPT)  Communication Skills    Identified an insight Comments   Today I met with  Timothy [name] in remote video (WebEx) face-to-face individual psychotherapy as an accomodation to the COVID-19 Pandemic.  Distance Site: Client's Home Orginating Site: Dr Jannifer Franklin Remote Office Consent: Obtained verbal consent to transmit  session remotely    Timothy [name] reports that he had a better week at work.  He d/ what was different and how he was better at not personalizing situations at work.  Timothy [name] shared that his wife "blew up" at him earlier this past weekend.  We d/e/p the events leading to her  emotional outburst.  We then d/ that he needed to be more present (less checked out)  and intentional about paying attention to the people he lives with.   I was able to support his reparation attempts with his wife and p/s.  I encouraged him to participate in the exercises his wife needed to do, that they would both benefit from the activity.  He agreed to try.  I push him to create a plan otherwise it was very unlikely to occur.         Progress: Patient is experiencing an increased level of depression aeb low to moderate level He has improved some in the area of confronting difficult situations/subject which are normally his main anxiety triggers.   Royetta Crochet, PhD

## 2021-09-20 ENCOUNTER — Ambulatory Visit (INDEPENDENT_AMBULATORY_CARE_PROVIDER_SITE_OTHER): Payer: Federal, State, Local not specified - PPO | Admitting: Psychology

## 2021-09-20 DIAGNOSIS — F411 Generalized anxiety disorder: Secondary | ICD-10-CM

## 2021-09-20 NOTE — Addendum Note (Signed)
Addended by: Marin Olp ANN on: 09/20/2021 10:19 AM   Modules accepted: Level of Service

## 2021-09-20 NOTE — Progress Notes (Signed)
PROGRESS NOTE  Start: 09/20/2021 08:00 AM End: 09/20/2021 08:55 AM  Diagnosis Z60.0 (Phase of life problem)  311 (Other depressive disorder)  F41.1 (Generalized anxiety disorder)  Symptoms Autonomic hyperactivity (e.g., palpitations, shortness of breath, dry mouth, trouble swallowing, nausea, diarrhea). (Status: maintained) -- No Description Entered  Depressed or irritable mood. (Status: maintained) -- No Description Entered  Excessive and/or unrealistic worry that is difficult to control occurring more days than not for at least 6 months about a number of events or activities. (Status: maintained) -- No Description Entered  Feelings of hopelessness, worthlessness, or inappropriate guilt. (Status: maintained) -- No Description Entered  Hypervigilance (e.g., feeling constantly on edge, experiencing concentration difficulties, having trouble falling or staying asleep, exhibiting a general state of irritability). (Status: maintained) -- No Description Entered  Low self-esteem. (Status: maintained) -- No Description Entered  Motor tension (e.g., restlessness, tiredness, shakiness, muscle tension). (Status: maintained) -- No Description Entered  Restlessness and feelings of lost identity and meaning due to retirement. (Status: maintained) -- No Description Entered  Medication Status na  Safety none  If Suicidal or Homicidal State Action Taken: unspecified  Current Risk: low Medications unspecified Objectives Related Problem: Develop healthy interpersonal relationships that lead to the alleviation and help prevent the relapse of depression. Description: Learn and implement behavioral strategies to overcome depression. Target Date: 2022-02-22 Frequency: Biweekly Modality: individual Progress: 40%  Related Problem: Develop healthy interpersonal relationships that lead to the alleviation and help prevent the relapse of depression. Description: Identify and replace thoughts and beliefs  that support depression. Target Date: 2022-02-22 Frequency: Biweekly Modality: individual Progress: 40%  Related Problem: Develop healthy interpersonal relationships that lead to the alleviation and help prevent the relapse of depression. Description: Identify important people in life, past and present, and describe the quality, good and poor, of those relationships. Target Date: 2022-02-22 Frequency: Biweekly Modality: individual Progress: 60%  Related Problem: Develop healthy interpersonal relationships that lead to the alleviation and help prevent the relapse of depression. Description: Verbalize insight into how past relationships may be influencing current experiences with depression. Target Date: 2022-02-22 Frequency: Biweekly Modality: individual Progress: 40%  Related Problem: Develop healthy interpersonal relationships that lead to the alleviation and help prevent the relapse of depression. Description: Learn and implement problem-solving and decision-making skills. Target Date: 2022-02-22 Frequency: Biweekly Modality: individual Progress: 0%  Related Problem: Develop healthy interpersonal relationships that lead to the alleviation and help prevent the relapse of depression. Description: Learn and implement conflict resolution skills to resolve interpersonal problems. Target Date: 2022-02-22 Frequency: Biweekly Modality: individual Progress: 0%  Related Problem: Develop healthy interpersonal relationships that lead to the alleviation and help prevent the relapse of depression. Description: Increasingly verbalize hopeful and positive statements regarding self, others, and the future. Target Date: 2022-02-22 Frequency: Biweekly Modality: individual Progress: 0%  Related Problem: Learn and implement coping skills that result in a reduction of anxiety and worry, and improved daily functioning. Description: Identify the major life conflicts from the past and present that  form the basis for present anxiety. Target Date: 2022-02-22 Frequency: Biweekly Modality: individual Progress: 50%  Related Problem: Learn and implement coping skills that result in a reduction of anxiety and worry, and improved daily functioning. Description: Maintain involvement in work, family, and social activities. Target Date: 2022-02-23 Frequency: Biweekly Modality: individual Progress: 40%  Related Problem: Learn and implement coping skills that result in a reduction of anxiety and worry, and improved daily functioning. Description: Identify, challenge, and replace biased, fearful self-talk with positive, realistic,  and empowering self-talk. Target Date: 2022-02-22 Frequency: Biweekly Modality: individual Progress: 0%  Related Problem: Learn and implement coping skills that result in a reduction of anxiety and worry, and improved daily functioning. Description: Learn and implement problem-solving strategies for realistically addressing worries. Target Date: 2022-02-22 Frequency: Biweekly Modality: individual Progress: 0%  Related Problem: Learn and implement coping skills that result in a reduction of anxiety and worry, and improved daily functioning. Description: Identify and engage in pleasant activities on a daily basis. Target Date: 2022-02-22 Frequency: Biweekly Modality: individual Progress: 0%  Related Problem: Learn and implement coping skills that result in a reduction of anxiety and worry, and improved daily functioning. Description: Learn and implement personal and interpersonal skills to reduce anxiety and improve interpersonal relationships. Target Date: 2022-02-22 Frequency: Biweekly Modality: individual Progress: 0%  Related Problem: Learn and implement coping skills that result in a reduction of anxiety and worry, and improved daily functioning. Description: Learn to accept limitations in life and commit to tolerating, rather than avoiding, unpleasant  emotions while accomplishing meaningful goals. Target Date: 2022-02-22 Frequency: Biweekly Modality: individual Progress: 0%  Related Problem: Resolve conflicted feelings and adapt to the new life circumstances. Description: Implement new activities that increase a sense of satisfaction. Target Date: 2022-02-22 Frequency: Biweekly Modality: individual Progress: 0% Planned Intervention: Develop a plan with the client to include activities that will increase his/her satisfaction, fulfill his/her values, and improve the quality of his/her life.  Related Problem: Resolve conflicted feelings and adapt to the new life circumstances. Description: Implement increased assertiveness to take control of conflicts. Target Date: 2022-02-22 Frequency: Biweekly Modality: individual Progress: 0%  Related Problem: Resolve conflicted feelings and adapt to the new life circumstances. Description: Apply problem-solving skills to current circumstances. Target Date: 2022-02-22 Frequency: Biweekly Modality: individual Progress: 0%  Related Problem: Resolve conflicted feelings and adapt to the new life circumstances. Description: Increase communication with significant others regarding current life stress factors. Target Date: 2022-02-22 Frequency: Biweekly Modality: individual Progress: 0%  Related Problem: Resolve conflicted feelings and adapt to the new life circumstances. Description: Implement changes in time and effort allocation to restore balance to life. Target Date: 2022-02-22 Frequency: Biweekly Modality: individual Progress: 0%  Related Problem: Resolve conflicted feelings and adapt to the new life circumstances. Description: Increase activities that reinforce a positive self-identity. Target Date: 2022-02-22 Frequency: Biweekly Modality: individual Progress: 0% Planned Intervention: Assist the client in clarifying his/her identity and meaning in life by listing his/her strengths,  positive traits and talents, potential ways to contribute to society, and areas of interest and ability that have not yet been developed (or assign "What's Good About Me and My Life?" from the Adult Psychotherapy Homework Planner by Bryn Gull[employer]).  Planned Intervention: Develop an action plan with the client to increase activities that give meaning and expand his/her sense of identity at a time of transition in life phases (e.g., single to married, employed to homemaker, childless to parent, employed to retired); Engineer, building services; suggest the client read material on transitioning in life (e.g., Managing Transitions: Making the Most of Change or Transitions: Making Sense of Life's Changes by [employer]).  Related Problem: Resolve conflicted feelings and adapt to the new life circumstances. Description: Share emotional struggles related to current adjustment stress. Target Date: 2022-02-22 Frequency: Biweekly Modality: individual Progress: 0%  Client Response full compliance  Service Location Location, [address] [place], [place] [address]  Service Code cpt [phone]) Facilitate problem solving  Identify automatic thoughts  Rationally challenge thoughts or beliefs/cognitive  restructuring  Validate/empathize  Identify/label emotions  Communication analysis (IPT)  Clarify interpersonal incident (IPT)  Communication Skills  Identified an insight Mindfulness Comments   Today I met with  Timothy [name] in remote video (WebEx) face-to-face individual psychotherapy as an accomodation to the COVID-19 Pandemic.  Distance Site: Client's Home Orginating Site: Dr Jannifer Franklin Remote Office Consent: Obtained verbal consent to transmit  session remotely    Abem reports that he had an encounter with his supervisor which resulted in a write up being placed in his file.  We d/e/p the events leading up to her decision.  Rhen was displacing blame and I gently helped him to see that this was  another version of him checking out and not dealing with things in a timely manner.  He then shared a related problem at home in which he was also called out for not being available.   We d/ strategies to be more mindful, present and intentional in his life and actions.   Progress: Patient is experiencing an increased level of anxiety and depression aeb low to moderate level of low mood states, negative and anxious ruminations. He fluctuates in his ability to confront difficult situations/subject which are normally his main anxiety triggers.   Royetta Crochet, PhD

## 2021-10-04 ENCOUNTER — Ambulatory Visit: Payer: Federal, State, Local not specified - PPO | Admitting: Psychology

## 2021-10-18 ENCOUNTER — Ambulatory Visit (INDEPENDENT_AMBULATORY_CARE_PROVIDER_SITE_OTHER): Payer: Federal, State, Local not specified - PPO | Admitting: Psychology

## 2021-10-18 DIAGNOSIS — F411 Generalized anxiety disorder: Secondary | ICD-10-CM | POA: Diagnosis not present

## 2021-10-18 NOTE — Progress Notes (Signed)
?PROGRESS NOTE ? ?Patient Name: Timothy [name]  ?Date of Birth: Apr 14, 1976  ?MRN: [id number]  ?Date of Service: 03/20//2023 ?Start Time: 08:00 AM ?End Time: 08:55 AM ? ?Diagnosis ?Z60.0 (Phase of life problem)  ?311 (Other depressive disorder)  ?F41.1 (Generalized anxiety disorder)  ?Symptoms ?Autonomic hyperactivity (e.g., palpitations, shortness of breath, dry mouth, trouble swallowing, nausea, diarrhea). (Status: maintained) -- No Description Entered  ?Depressed or irritable mood. (Status: maintained) -- No Description Entered  ?Excessive and/or unrealistic worry that is difficult to control occurring more days than not for at least 6 months about a number of events or activities. (Status: maintained) -- No Description Entered  ?Feelings of hopelessness, worthlessness, or inappropriate guilt. (Status: maintained) -- No Description Entered  ?Hypervigilance (e.g., feeling constantly on edge, experiencing concentration difficulties, having trouble falling or staying asleep, exhibiting a general state of irritability). (Status: maintained) -- No Description Entered  ?Low self-esteem. (Status: maintained) -- No Description Entered  ?Motor tension (e.g., restlessness, tiredness, shakiness, muscle tension). (Status: maintained) -- No Description Entered  ?Restlessness and feelings of lost identity and meaning due to retirement. (Status: maintained) -- No Description Entered  ?Medication Status ?na  ?Safety ?none  ?If Suicidal or Homicidal State Action Taken: unspecified  ?Current Risk: low ?Medications ?unspecified ?Objectives ?Related Problem: Develop healthy interpersonal relationships that lead to the alleviation and help prevent the relapse of depression. ?Description: Learn and implement behavioral strategies to overcome depression. ?Target Date: 2022-02-22 ?Frequency: Biweekly ?Modality: individual ?Progress: 40% ? ?Related Problem: Develop healthy interpersonal relationships that lead to the alleviation and help  prevent the relapse of depression. ?Description: Identify and replace thoughts and beliefs that support depression. ?Target Date: 2022-02-22 ?Frequency: Biweekly ?Modality: individual ?Progress: 40% ? ?Related Problem: Develop healthy interpersonal relationships that lead to the alleviation and help prevent the relapse of depression. ?Description: Identify important people in life, past and present, and describe the quality, good and poor, of those relationships. ?Target Date: 2022-02-22 ?Frequency: Biweekly ?Modality: individual ?Progress: 60% ? ?Related Problem: Develop healthy interpersonal relationships that lead to the alleviation and help prevent the relapse of depression. ?Description: Verbalize insight into how past relationships may be influencing current experiences with depression. ?Target Date: 2022-02-22 ?Frequency: Biweekly ?Modality: individual ?Progress: 40% ? ?Related Problem: Develop healthy interpersonal relationships that lead to the alleviation and help prevent the relapse of depression. ?Description: Learn and implement problem-solving and decision-making skills. ?Target Date: 2022-02-22 ?Frequency: Biweekly ?Modality: individual ?Progress: 0% ? ?Related Problem: Develop healthy interpersonal relationships that lead to the alleviation and help prevent the relapse of depression. ?Description: Learn and implement conflict resolution skills to resolve interpersonal problems. ?Target Date: 2022-02-22 ?Frequency: Biweekly ?Modality: individual ?Progress: 0% ? ?Related Problem: Develop healthy interpersonal relationships that lead to the alleviation and help prevent the relapse of depression. ?Description: Increasingly verbalize hopeful and positive statements regarding self, others, and the future. ?Target Date: 2022-02-22 ?Frequency: Biweekly ?Modality: individual ?Progress: 0% ? ?Related Problem: Learn and implement coping skills that result in a reduction of anxiety and worry, and improved daily  functioning. ?Description: Identify the major life conflicts from the past and present that form the basis for present anxiety. ?Target Date: 2022-02-22 ?Frequency: Biweekly ?Modality: individual ?Progress: 50% ? ?Related Problem: Learn and implement coping skills that result in a reduction of anxiety and worry, and improved daily functioning. ?Description: Maintain involvement in work, family, and social activities. ?Target Date: 2022-02-23 ?Frequency: Biweekly ?Modality: individual ?Progress: 40% ? ?Related Problem: Learn and implement coping skills that result in a reduction of anxiety  and worry, and improved daily functioning. ?Description: Identify, challenge, and replace biased, fearful self-talk with positive, realistic, and empowering self-talk. ?Target Date: 2022-02-22 ?Frequency: Biweekly ?Modality: individual ?Progress: 0% ? ?Related Problem: Learn and implement coping skills that result in a reduction of anxiety and worry, and improved daily functioning. ?Description: Learn and implement problem-solving strategies for realistically addressing worries. ?Target Date: 2022-02-22 ?Frequency: Biweekly ?Modality: individual ?Progress: 0% ? ?Related Problem: Learn and implement coping skills that result in a reduction of anxiety and worry, and improved daily functioning. ?Description: Identify and engage in pleasant activities on a daily basis. ?Target Date: 2022-02-22 ?Frequency: Biweekly ?Modality: individual ?Progress: 0% ? ?Related Problem: Learn and implement coping skills that result in a reduction of anxiety and worry, and improved daily functioning. ?Description: Learn and implement personal and interpersonal skills to reduce anxiety and improve interpersonal relationships. ?Target Date: 2022-02-22 ?Frequency: Biweekly ?Modality: individual ?Progress: 0% ? ?Related Problem: Learn and implement coping skills that result in a reduction of anxiety and worry, and improved daily functioning. ?Description:  Learn to accept limitations in life and commit to tolerating, rather than avoiding, unpleasant emotions while accomplishing meaningful goals. ?Target Date: 2022-02-22 ?Frequency: Biweekly ?Modality: individual ?Progress: 0% ? ?Related Problem: Resolve conflicted feelings and adapt to the new life circumstances. ?Description: Implement new activities that increase a sense of satisfaction. ?Target Date: 2022-02-22 ?Frequency: Biweekly ?Modality: individual ?Progress: 0% ?Planned Intervention: Develop a plan with the client to include activities that will increase his/her satisfaction, fulfill his/her values, and improve the quality of his/her life.  ?Related Problem: Resolve conflicted feelings and adapt to the new life circumstances. ?Description: Implement increased assertiveness to take control of conflicts. ?Target Date: 2022-02-22 ?Frequency: Biweekly ?Modality: individual ?Progress: 0% ? ?Related Problem: Resolve conflicted feelings and adapt to the new life circumstances. ?Description: Apply problem-solving skills to current circumstances. ?Target Date: 2022-02-22 ?Frequency: Biweekly ?Modality: individual ?Progress: 0% ? ?Related Problem: Resolve conflicted feelings and adapt to the new life circumstances. ?Description: Increase communication with significant others regarding current life stress factors. ?Target Date: 2022-02-22 ?Frequency: Biweekly ?Modality: individual ?Progress: 0% ? ?Related Problem: Resolve conflicted feelings and adapt to the new life circumstances. ?Description: Implement changes in time and effort allocation to restore balance to life. ?Target Date: 2022-02-22 ?Frequency: Biweekly ?Modality: individual ?Progress: 0% ? ?Related Problem: Resolve conflicted feelings and adapt to the new life circumstances. ?Description: Increase activities that reinforce a positive self-identity. ?Target Date: 2022-02-22 ?Frequency: Biweekly ?Modality: individual ?Progress: 0% ?Planned Intervention:  Assist the client in clarifying his/her identity and meaning in life by listing his/her strengths, positive traits and talents, potential ways to contribute to society, and areas of interest and ability that

## 2021-11-01 ENCOUNTER — Ambulatory Visit: Payer: Federal, State, Local not specified - PPO | Admitting: Psychology

## 2021-11-15 ENCOUNTER — Ambulatory Visit (INDEPENDENT_AMBULATORY_CARE_PROVIDER_SITE_OTHER): Payer: Federal, State, Local not specified - PPO | Admitting: Psychology

## 2021-11-15 DIAGNOSIS — F411 Generalized anxiety disorder: Secondary | ICD-10-CM

## 2021-11-15 NOTE — Progress Notes (Signed)
?PROGRESS NOTE ? ?Patient Name: Timothy [name]  ?Date of Birth: Jun 12, 1976  ?MRN: [id number]  ?Date of Service: 04/17//2023 ?Start Time: 08:00 AM ?End Time: 08:55 AM ? ?Diagnosis ?Z60.0 (Phase of life problem)  ?311 (Other depressive disorder)  ?F41.1 (Generalized anxiety disorder)  ?Symptoms ?Autonomic hyperactivity (e.g., palpitations, shortness of breath, dry mouth, trouble swallowing, nausea, diarrhea). (Status: maintained) -- No Description Entered  ?Depressed or irritable mood. (Status: maintained) -- No Description Entered  ?Excessive and/or unrealistic worry that is difficult to control occurring more days than not for at least 6 months about a number of events or activities. (Status: maintained) -- No Description Entered  ?Feelings of hopelessness, worthlessness, or inappropriate guilt. (Status: maintained) -- No Description Entered  ?Hypervigilance (e.g., feeling constantly on edge, experiencing concentration difficulties, having trouble falling or staying asleep, exhibiting a general state of irritability). (Status: maintained) -- No Description Entered  ?Low self-esteem. (Status: maintained) -- No Description Entered  ?Motor tension (e.g., restlessness, tiredness, shakiness, muscle tension). (Status: maintained) -- No Description Entered  ?Restlessness and feelings of lost identity and meaning due to retirement. (Status: maintained) -- No Description Entered  ?Medication Status ?na  ?Safety ?none  ?If Suicidal or Homicidal State Action Taken: unspecified  ?Current Risk: low ?Medications ?unspecified ?Objectives ?Related Problem: Develop healthy interpersonal relationships that lead to the alleviation and help prevent the relapse of depression. ?Description: Learn and implement behavioral strategies to overcome depression. ?Target Date: 2022-02-22 ?Frequency: Biweekly ?Modality: individual ?Progress: 40% ? ?Related Problem: Develop healthy interpersonal relationships that lead to the alleviation and help  prevent the relapse of depression. ?Description: Identify and replace thoughts and beliefs that support depression. ?Target Date: 2022-02-22 ?Frequency: Biweekly ?Modality: individual ?Progress: 40% ? ?Related Problem: Develop healthy interpersonal relationships that lead to the alleviation and help prevent the relapse of depression. ?Description: Identify important people in life, past and present, and describe the quality, good and poor, of those relationships. ?Target Date: 2022-02-22 ?Frequency: Biweekly ?Modality: individual ?Progress: 60% ? ?Related Problem: Develop healthy interpersonal relationships that lead to the alleviation and help prevent the relapse of depression. ?Description: Verbalize insight into how past relationships may be influencing current experiences with depression. ?Target Date: 2022-02-22 ?Frequency: Biweekly ?Modality: individual ?Progress: 40% ? ?Related Problem: Develop healthy interpersonal relationships that lead to the alleviation and help prevent the relapse of depression. ?Description: Learn and implement problem-solving and decision-making skills. ?Target Date: 2022-02-22 ?Frequency: Biweekly ?Modality: individual ?Progress: 0% ? ?Related Problem: Develop healthy interpersonal relationships that lead to the alleviation and help prevent the relapse of depression. ?Description: Learn and implement conflict resolution skills to resolve interpersonal problems. ?Target Date: 2022-02-22 ?Frequency: Biweekly ?Modality: individual ?Progress: 0% ? ?Related Problem: Develop healthy interpersonal relationships that lead to the alleviation and help prevent the relapse of depression. ?Description: Increasingly verbalize hopeful and positive statements regarding self, others, and the future. ?Target Date: 2022-02-22 ?Frequency: Biweekly ?Modality: individual ?Progress: 0% ? ?Related Problem: Learn and implement coping skills that result in a reduction of anxiety and worry, and improved daily  functioning. ?Description: Identify the major life conflicts from the past and present that form the basis for present anxiety. ?Target Date: 2022-02-22 ?Frequency: Biweekly ?Modality: individual ?Progress: 50% ? ?Related Problem: Learn and implement coping skills that result in a reduction of anxiety and worry, and improved daily functioning. ?Description: Maintain involvement in work, family, and social activities. ?Target Date: 2022-02-23 ?Frequency: Biweekly ?Modality: individual ?Progress: 40% ? ?Related Problem: Learn and implement coping skills that result in a reduction of anxiety  and worry, and improved daily functioning. ?Description: Identify, challenge, and replace biased, fearful self-talk with positive, realistic, and empowering self-talk. ?Target Date: 2022-02-22 ?Frequency: Biweekly ?Modality: individual ?Progress: 0% ? ?Related Problem: Learn and implement coping skills that result in a reduction of anxiety and worry, and improved daily functioning. ?Description: Learn and implement problem-solving strategies for realistically addressing worries. ?Target Date: 2022-02-22 ?Frequency: Biweekly ?Modality: individual ?Progress: 0% ? ?Related Problem: Learn and implement coping skills that result in a reduction of anxiety and worry, and improved daily functioning. ?Description: Identify and engage in pleasant activities on a daily basis. ?Target Date: 2022-02-22 ?Frequency: Biweekly ?Modality: individual ?Progress: 0% ? ?Related Problem: Learn and implement coping skills that result in a reduction of anxiety and worry, and improved daily functioning. ?Description: Learn and implement personal and interpersonal skills to reduce anxiety and improve interpersonal relationships. ?Target Date: 2022-02-22 ?Frequency: Biweekly ?Modality: individual ?Progress: 0% ? ?Related Problem: Learn and implement coping skills that result in a reduction of anxiety and worry, and improved daily functioning. ?Description:  Learn to accept limitations in life and commit to tolerating, rather than avoiding, unpleasant emotions while accomplishing meaningful goals. ?Target Date: 2022-02-22 ?Frequency: Biweekly ?Modality: individual ?Progress: 0% ? ?Related Problem: Resolve conflicted feelings and adapt to the new life circumstances. ?Description: Implement new activities that increase a sense of satisfaction. ?Target Date: 2022-02-22 ?Frequency: Biweekly ?Modality: individual ?Progress: 0% ?Planned Intervention: Develop a plan with the client to include activities that will increase his/her satisfaction, fulfill his/her values, and improve the quality of his/her life.  ?Related Problem: Resolve conflicted feelings and adapt to the new life circumstances. ?Description: Implement increased assertiveness to take control of conflicts. ?Target Date: 2022-02-22 ?Frequency: Biweekly ?Modality: individual ?Progress: 0% ? ?Related Problem: Resolve conflicted feelings and adapt to the new life circumstances. ?Description: Apply problem-solving skills to current circumstances. ?Target Date: 2022-02-22 ?Frequency: Biweekly ?Modality: individual ?Progress: 0% ? ?Related Problem: Resolve conflicted feelings and adapt to the new life circumstances. ?Description: Increase communication with significant others regarding current life stress factors. ?Target Date: 2022-02-22 ?Frequency: Biweekly ?Modality: individual ?Progress: 0% ? ?Related Problem: Resolve conflicted feelings and adapt to the new life circumstances. ?Description: Implement changes in time and effort allocation to restore balance to life. ?Target Date: 2022-02-22 ?Frequency: Biweekly ?Modality: individual ?Progress: 0% ? ?Related Problem: Resolve conflicted feelings and adapt to the new life circumstances. ?Description: Increase activities that reinforce a positive self-identity. ?Target Date: 2022-02-22 ?Frequency: Biweekly ?Modality: individual ?Progress: 0% ?Planned Intervention:  Assist the client in clarifying his/her identity and meaning in life by listing his/her strengths, positive traits and talents, potential ways to contribute to society, and areas of interest and ability that

## 2021-11-29 ENCOUNTER — Ambulatory Visit (INDEPENDENT_AMBULATORY_CARE_PROVIDER_SITE_OTHER): Payer: Federal, State, Local not specified - PPO | Admitting: Psychology

## 2021-11-29 DIAGNOSIS — F411 Generalized anxiety disorder: Secondary | ICD-10-CM | POA: Diagnosis not present

## 2021-11-29 NOTE — Progress Notes (Signed)
?PROGRESS NOTE ? ?Patient Name: Timothy [name]  ?Date of Birth: 12-15-1975  ?MRN: [id number]  ?Date of Service: 05/1//2023 ?Start Time: 08:00 AM ?End Time: 08:55 AM ? ?Diagnosis ?Z60.0 (Phase of life problem)  ?311 (Other depressive disorder)  ?F41.1 (Generalized anxiety disorder)  ?Symptoms ?Autonomic hyperactivity (e.g., palpitations, shortness of breath, dry mouth, trouble swallowing, nausea, diarrhea). (Status: maintained) -- No Description Entered  ?Depressed or irritable mood. (Status: maintained) -- No Description Entered  ?Excessive and/or unrealistic worry that is difficult to control occurring more days than not for at least 6 months about a number of events or activities. (Status: maintained) -- No Description Entered  ?Feelings of hopelessness, worthlessness, or inappropriate guilt. (Status: maintained) -- No Description Entered  ?Hypervigilance (e.g., feeling constantly on edge, experiencing concentration difficulties, having trouble falling or staying asleep, exhibiting a general state of irritability). (Status: maintained) -- No Description Entered  ?Low self-esteem. (Status: maintained) -- No Description Entered  ?Motor tension (e.g., restlessness, tiredness, shakiness, muscle tension). (Status: maintained) -- No Description Entered  ?Restlessness and feelings of lost identity and meaning due to retirement. (Status: maintained) -- No Description Entered  ?Medication Status ?na  ?Safety ?none  ?If Suicidal or Homicidal State Action Taken: unspecified  ?Current Risk: low ?Medications ?unspecified ?Objectives ?Related Problem: Develop healthy interpersonal relationships that lead to the alleviation and help prevent the relapse of depression. ?Description: Learn and implement behavioral strategies to overcome depression. ?Target Date: 2022-02-22 ?Frequency: Biweekly ?Modality: individual ?Progress: 40% ? ?Related Problem: Develop healthy interpersonal relationships that lead to the alleviation and help  prevent the relapse of depression. ?Description: Identify and replace thoughts and beliefs that support depression. ?Target Date: 2022-02-22 ?Frequency: Biweekly ?Modality: individual ?Progress: 40% ? ?Related Problem: Develop healthy interpersonal relationships that lead to the alleviation and help prevent the relapse of depression. ?Description: Identify important people in life, past and present, and describe the quality, good and poor, of those relationships. ?Target Date: 2022-02-22 ?Frequency: Biweekly ?Modality: individual ?Progress: 60% ? ?Related Problem: Develop healthy interpersonal relationships that lead to the alleviation and help prevent the relapse of depression. ?Description: Verbalize insight into how past relationships may be influencing current experiences with depression. ?Target Date: 2022-02-22 ?Frequency: Biweekly ?Modality: individual ?Progress: 40% ? ?Related Problem: Develop healthy interpersonal relationships that lead to the alleviation and help prevent the relapse of depression. ?Description: Learn and implement problem-solving and decision-making skills. ?Target Date: 2022-02-22 ?Frequency: Biweekly ?Modality: individual ?Progress: 0% ? ?Related Problem: Develop healthy interpersonal relationships that lead to the alleviation and help prevent the relapse of depression. ?Description: Learn and implement conflict resolution skills to resolve interpersonal problems. ?Target Date: 2022-02-22 ?Frequency: Biweekly ?Modality: individual ?Progress: 0% ? ?Related Problem: Develop healthy interpersonal relationships that lead to the alleviation and help prevent the relapse of depression. ?Description: Increasingly verbalize hopeful and positive statements regarding self, others, and the future. ?Target Date: 2022-02-22 ?Frequency: Biweekly ?Modality: individual ?Progress: 0% ? ?Related Problem: Learn and implement coping skills that result in a reduction of anxiety and worry, and improved daily  functioning. ?Description: Identify the major life conflicts from the past and present that form the basis for present anxiety. ?Target Date: 2022-02-22 ?Frequency: Biweekly ?Modality: individual ?Progress: 50% ? ?Related Problem: Learn and implement coping skills that result in a reduction of anxiety and worry, and improved daily functioning. ?Description: Maintain involvement in work, family, and social activities. ?Target Date: 2022-02-23 ?Frequency: Biweekly ?Modality: individual ?Progress: 40% ? ?Related Problem: Learn and implement coping skills that result in a reduction of anxiety  and worry, and improved daily functioning. ?Description: Identify, challenge, and replace biased, fearful self-talk with positive, realistic, and empowering self-talk. ?Target Date: 2022-02-22 ?Frequency: Biweekly ?Modality: individual ?Progress: 0% ? ?Related Problem: Learn and implement coping skills that result in a reduction of anxiety and worry, and improved daily functioning. ?Description: Learn and implement problem-solving strategies for realistically addressing worries. ?Target Date: 2022-02-22 ?Frequency: Biweekly ?Modality: individual ?Progress: 0% ? ?Related Problem: Learn and implement coping skills that result in a reduction of anxiety and worry, and improved daily functioning. ?Description: Identify and engage in pleasant activities on a daily basis. ?Target Date: 2022-02-22 ?Frequency: Biweekly ?Modality: individual ?Progress: 0% ? ?Related Problem: Learn and implement coping skills that result in a reduction of anxiety and worry, and improved daily functioning. ?Description: Learn and implement personal and interpersonal skills to reduce anxiety and improve interpersonal relationships. ?Target Date: 2022-02-22 ?Frequency: Biweekly ?Modality: individual ?Progress: 0% ? ?Related Problem: Learn and implement coping skills that result in a reduction of anxiety and worry, and improved daily functioning. ?Description:  Learn to accept limitations in life and commit to tolerating, rather than avoiding, unpleasant emotions while accomplishing meaningful goals. ?Target Date: 2022-02-22 ?Frequency: Biweekly ?Modality: individual ?Progress: 0% ? ?Related Problem: Resolve conflicted feelings and adapt to the new life circumstances. ?Description: Implement new activities that increase a sense of satisfaction. ?Target Date: 2022-02-22 ?Frequency: Biweekly ?Modality: individual ?Progress: 0% ?Planned Intervention: Develop a plan with the client to include activities that will increase his/her satisfaction, fulfill his/her values, and improve the quality of his/her life.  ?Related Problem: Resolve conflicted feelings and adapt to the new life circumstances. ?Description: Implement increased assertiveness to take control of conflicts. ?Target Date: 2022-02-22 ?Frequency: Biweekly ?Modality: individual ?Progress: 0% ? ?Related Problem: Resolve conflicted feelings and adapt to the new life circumstances. ?Description: Apply problem-solving skills to current circumstances. ?Target Date: 2022-02-22 ?Frequency: Biweekly ?Modality: individual ?Progress: 0% ? ?Related Problem: Resolve conflicted feelings and adapt to the new life circumstances. ?Description: Increase communication with significant others regarding current life stress factors. ?Target Date: 2022-02-22 ?Frequency: Biweekly ?Modality: individual ?Progress: 0% ? ?Related Problem: Resolve conflicted feelings and adapt to the new life circumstances. ?Description: Implement changes in time and effort allocation to restore balance to life. ?Target Date: 2022-02-22 ?Frequency: Biweekly ?Modality: individual ?Progress: 0% ? ?Related Problem: Resolve conflicted feelings and adapt to the new life circumstances. ?Description: Increase activities that reinforce a positive self-identity. ?Target Date: 2022-02-22 ?Frequency: Biweekly ?Modality: individual ?Progress: 0% ?Planned Intervention:  Assist the client in clarifying his/her identity and meaning in life by listing his/her strengths, positive traits and talents, potential ways to contribute to society, and areas of interest and ability that

## 2021-12-13 ENCOUNTER — Ambulatory Visit (INDEPENDENT_AMBULATORY_CARE_PROVIDER_SITE_OTHER): Payer: Federal, State, Local not specified - PPO | Admitting: Psychology

## 2021-12-13 DIAGNOSIS — F411 Generalized anxiety disorder: Secondary | ICD-10-CM | POA: Diagnosis not present

## 2021-12-13 NOTE — Progress Notes (Signed)
PROGRESS NOTE  Patient Name: Masoud Nyce  Date of Birth: Feb 28, 1976  MRN: [id number]  Date of Service: 05/15//2023 Start Time: 08:00 AM End Time: 08:55 AM  Annual Review: 02/22/2022  Diagnosis Z60.0 (Phase of life problem)  311 (Other depressive disorder)  F41.1 (Generalized anxiety disorder)  Symptoms Autonomic hyperactivity (e.g., palpitations, shortness of breath, dry mouth, trouble swallowing, nausea, diarrhea).  Depressed or irritable mood.  Excessive and/or unrealistic worry that is difficult to control occurring more days than not for at least 6 months about a number of events or activities.  Feelings of hopelessness, worthlessness, or inappropriate guilt.  Hypervigilance (e.g., feeling constantly on edge, experiencing concentration difficulties, having trouble falling or staying asleep, exhibiting a general state of irritability).  Low self-esteem.  Motor tension (e.g., restlessness, tiredness, shakiness, muscle tension).  Restlessness and feelings of lost identity and meaning due to retirement.   Medication Status na  Safety none  If Suicidal or Homicidal State Action Taken: unspecified  Current Risk: low Medications unspecified Objectives Related Problem: Develop healthy interpersonal relationships that lead to the alleviation and help prevent the relapse of depression. Description: Learn and implement behavioral strategies to overcome depression. Target Date: 2022-02-22 Frequency: Biweekly Modality: individual Progress: 40%  Related Problem: Develop healthy interpersonal relationships that lead to the alleviation and help prevent the relapse of depression. Description: Identify and replace thoughts and beliefs that support depression. Target Date: 2022-02-22 Frequency: Biweekly Modality: individual Progress: 40%  Related Problem: Develop healthy interpersonal relationships that lead to the alleviation and help prevent the relapse of  depression. Description: Identify important people in life, past and present, and describe the quality, good and poor, of those relationships. Target Date: 2022-02-22 Frequency: Biweekly Modality: individual Progress: 60%  Related Problem: Develop healthy interpersonal relationships that lead to the alleviation and help prevent the relapse of depression. Description: Verbalize insight into how past relationships may be influencing current experiences with depression. Target Date: 2022-02-22 Frequency: Biweekly Modality: individual Progress: 40%  Related Problem: Develop healthy interpersonal relationships that lead to the alleviation and help prevent the relapse of depression. Description: Learn and implement problem-solving and decision-making skills. Target Date: 2022-02-22 Frequency: Biweekly Modality: individual Progress: 0%  Related Problem: Develop healthy interpersonal relationships that lead to the alleviation and help prevent the relapse of depression. Description: Learn and implement conflict resolution skills to resolve interpersonal problems. Target Date: 2022-02-22 Frequency: Biweekly Modality: individual Progress: 0%  Related Problem: Develop healthy interpersonal relationships that lead to the alleviation and help prevent the relapse of depression. Description: Increasingly verbalize hopeful and positive statements regarding self, others, and the future. Target Date: 2022-02-22 Frequency: Biweekly Modality: individual Progress: 0%  Related Problem: Learn and implement coping skills that result in a reduction of anxiety and worry, and improved daily functioning. Description: Identify the major life conflicts from the past and present that form the basis for present anxiety. Target Date: 2022-02-22 Frequency: Biweekly Modality: individual Progress: 50%  Related Problem: Learn and implement coping skills that result in a reduction of anxiety and worry, and  improved daily functioning. Description: Maintain involvement in work, family, and social activities. Target Date: 2022-02-23 Frequency: Biweekly Modality: individual Progress: 40%  Related Problem: Learn and implement coping skills that result in a reduction of anxiety and worry, and improved daily functioning. Description: Identify, challenge, and replace biased, fearful self-talk with positive, realistic, and empowering self-talk. Target Date: 2022-02-22 Frequency: Biweekly Modality: individual Progress: 0%  Related Problem: Learn and implement coping skills that result in a reduction of  anxiety and worry, and improved daily functioning. Description: Learn and implement problem-solving strategies for realistically addressing worries. Target Date: 2022-02-22 Frequency: Biweekly Modality: individual Progress: 0%  Related Problem: Learn and implement coping skills that result in a reduction of anxiety and worry, and improved daily functioning. Description: Identify and engage in pleasant activities on a daily basis. Target Date: 2022-02-22 Frequency: Biweekly Modality: individual Progress: 0%  Related Problem: Learn and implement coping skills that result in a reduction of anxiety and worry, and improved daily functioning. Description: Learn and implement personal and interpersonal skills to reduce anxiety and improve interpersonal relationships. Target Date: 2022-02-22 Frequency: Biweekly Modality: individual Progress: 0%  Related Problem: Learn and implement coping skills that result in a reduction of anxiety and worry, and improved daily functioning. Description: Learn to accept limitations in life and commit to tolerating, rather than avoiding, unpleasant emotions while accomplishing meaningful goals. Target Date: 2022-02-22 Frequency: Biweekly Modality: individual Progress: 0%  Related Problem: Resolve conflicted feelings and adapt to the new life  circumstances. Description: Implement new activities that increase a sense of satisfaction. Target Date: 2022-02-22 Frequency: Biweekly Modality: individual Progress: 0% Planned Intervention: Develop a plan with the client to include activities that will increase his/her satisfaction, fulfill his/her values, and improve the quality of his/her life.  Related Problem: Resolve conflicted feelings and adapt to the new life circumstances. Description: Implement increased assertiveness to take control of conflicts. Target Date: 2022-02-22 Frequency: Biweekly Modality: individual Progress: 0%  Related Problem: Resolve conflicted feelings and adapt to the new life circumstances. Description: Apply problem-solving skills to current circumstances. Target Date: 2022-02-22 Frequency: Biweekly Modality: individual Progress: 0%  Related Problem: Resolve conflicted feelings and adapt to the new life circumstances. Description: Increase communication with significant others regarding current life stress factors. Target Date: 2022-02-22 Frequency: Biweekly Modality: individual Progress: 0%  Related Problem: Resolve conflicted feelings and adapt to the new life circumstances. Description: Implement changes in time and effort allocation to restore balance to life. Target Date: 2022-02-22 Frequency: Biweekly Modality: individual Progress: 0%  Related Problem: Resolve conflicted feelings and adapt to the new life circumstances. Description: Increase activities that reinforce a positive self-identity. Target Date: 2022-02-22 Frequency: Biweekly Modality: individual Progress: 0% Planned Intervention: Assist the client in clarifying his/her identity and meaning in life by listing his/her strengths, positive traits and talents, potential ways to contribute to society, and areas of interest and ability that have not yet been developed (or assign "What's Good About Me and My Life?" from the Adult  Psychotherapy Homework Planner by Bryn Gulling).  Planned Intervention: Develop an action plan with the client to increase activities that give meaning and expand his/her sense of identity at a time of transition in life phases (e.g., single to married, employed to homemaker, childless to parent, employed to retired); Engineer, building services; suggest the client read material on transitioning in life (e.g., Managing Transitions: Making the Most of Change or Transitions: Making Sense of Life's Changes by [employer]).  Related Problem: Resolve conflicted feelings and adapt to the new life circumstances. Description: Share emotional struggles related to current adjustment stress. Target Date: 2022-02-22 Frequency: Biweekly Modality: individual Progress: 0%  Client Response full compliance  Service Location Location, [address] [place], [place] [address]  Service Code cpt [phone]) Facilitate problem solving  Identify automatic thoughts  Rationally challenge thoughts or beliefs/cognitive restructuring  Validate/empathize  Identify/label emotions  Communication analysis (IPT)  Clarify interpersonal incident (IPT)  Communication Skills  Identified an insight Mindfulness Comments  Today I met with  [name] in remote video (WebEx) face-to-face individual psychotherapy as an accomodation to the COVID-19 Pandemic.  Distance Site: Client's Home Orginating Site: Dr Jannifer Franklin Remote Office Consent: Obtained verbal consent to transmit  session remotely    Nicholaus reports that he finds that he is feeling very bogged down in the process of figuring what to do next.  As he shared his frustration with the process, it became clear that he was hoping to get clearer answers.  We d/e in order to help Cleofas gain some insight to provide next steps.  As a result, he agreed to begin with creating a list of what he didn't want to do as a better starting point.     Progress: Patient is experiencing  an increased level of anxiety and depression aeb low to moderate level of low mood states, negative and anxious ruminations. He fluctuates in his ability to confront difficult situations/subject which are normally his main anxiety triggers.   Royetta Crochet, PhD

## 2021-12-30 ENCOUNTER — Ambulatory Visit (INDEPENDENT_AMBULATORY_CARE_PROVIDER_SITE_OTHER): Payer: Federal, State, Local not specified - PPO | Admitting: Psychology

## 2021-12-30 DIAGNOSIS — F411 Generalized anxiety disorder: Secondary | ICD-10-CM

## 2021-12-30 NOTE — Progress Notes (Signed)
PROGRESS NOTE  Patient Name: Timothy [name]  Date of Birth: Jul 19, 1976  MRN: [id number]  Date of Service: 06/01//2023 Start Time: 08:00 AM End Time: 08:55 AM  Annual Review: 02/22/2022  Diagnosis Z60.0 (Phase of life problem)  311 (Other depressive disorder)  F41.1 (Generalized anxiety disorder)  Symptoms Autonomic hyperactivity (e.g., palpitations, shortness of breath, dry mouth, trouble swallowing, nausea, diarrhea).  Depressed or irritable mood.  Excessive and/or unrealistic worry that is difficult to control occurring more days than not for at least 6 months about a number of events or activities.  Feelings of hopelessness, worthlessness, or inappropriate guilt.  Hypervigilance (e.g., feeling constantly on edge, experiencing concentration difficulties, having trouble falling or staying asleep, exhibiting a general state of irritability).  Low self-esteem.  Motor tension (e.g., restlessness, tiredness, shakiness, muscle tension).  Restlessness and feelings of lost identity and meaning due to retirement.   Medication Status na  Safety none  If Suicidal or Homicidal State Action Taken: unspecified  Current Risk: low Medications unspecified Objectives Related Problem: Develop healthy interpersonal relationships that lead to the alleviation and help prevent the relapse of depression. Description: Learn and implement behavioral strategies to overcome depression. Target Date: 2022-02-22 Frequency: Biweekly Modality: individual Progress: 40%  Related Problem: Develop healthy interpersonal relationships that lead to the alleviation and help prevent the relapse of depression. Description: Identify and replace thoughts and beliefs that support depression. Target Date: 2022-02-22 Frequency: Biweekly Modality: individual Progress: 40%  Related Problem: Develop healthy interpersonal relationships that lead to the alleviation and help prevent the relapse of  depression. Description: Identify important people in life, past and present, and describe the quality, good and poor, of those relationships. Target Date: 2022-02-22 Frequency: Biweekly Modality: individual Progress: 60%  Related Problem: Develop healthy interpersonal relationships that lead to the alleviation and help prevent the relapse of depression. Description: Verbalize insight into how past relationships may be influencing current experiences with depression. Target Date: 2022-02-22 Frequency: Biweekly Modality: individual Progress: 40%  Related Problem: Develop healthy interpersonal relationships that lead to the alleviation and help prevent the relapse of depression. Description: Learn and implement problem-solving and decision-making skills. Target Date: 2022-02-22 Frequency: Biweekly Modality: individual Progress: 0%  Related Problem: Develop healthy interpersonal relationships that lead to the alleviation and help prevent the relapse of depression. Description: Learn and implement conflict resolution skills to resolve interpersonal problems. Target Date: 2022-02-22 Frequency: Biweekly Modality: individual Progress: 0%  Related Problem: Develop healthy interpersonal relationships that lead to the alleviation and help prevent the relapse of depression. Description: Increasingly verbalize hopeful and positive statements regarding self, others, and the future. Target Date: 2022-02-22 Frequency: Biweekly Modality: individual Progress: 0%  Related Problem: Learn and implement coping skills that result in a reduction of anxiety and worry, and improved daily functioning. Description: Identify the major life conflicts from the past and present that form the basis for present anxiety. Target Date: 2022-02-22 Frequency: Biweekly Modality: individual Progress: 50%  Related Problem: Learn and implement coping skills that result in a reduction of anxiety and worry, and  improved daily functioning. Description: Maintain involvement in work, family, and social activities. Target Date: 2022-02-23 Frequency: Biweekly Modality: individual Progress: 40%  Related Problem: Learn and implement coping skills that result in a reduction of anxiety and worry, and improved daily functioning. Description: Identify, challenge, and replace biased, fearful self-talk with positive, realistic, and empowering self-talk. Target Date: 2022-02-22 Frequency: Biweekly Modality: individual Progress: 0%  Related Problem: Learn and implement coping skills that result in a reduction of  anxiety and worry, and improved daily functioning. Description: Learn and implement problem-solving strategies for realistically addressing worries. Target Date: 2022-02-22 Frequency: Biweekly Modality: individual Progress: 0%  Related Problem: Learn and implement coping skills that result in a reduction of anxiety and worry, and improved daily functioning. Description: Identify and engage in pleasant activities on a daily basis. Target Date: 2022-02-22 Frequency: Biweekly Modality: individual Progress: 0%  Related Problem: Learn and implement coping skills that result in a reduction of anxiety and worry, and improved daily functioning. Description: Learn and implement personal and interpersonal skills to reduce anxiety and improve interpersonal relationships. Target Date: 2022-02-22 Frequency: Biweekly Modality: individual Progress: 0%  Related Problem: Learn and implement coping skills that result in a reduction of anxiety and worry, and improved daily functioning. Description: Learn to accept limitations in life and commit to tolerating, rather than avoiding, unpleasant emotions while accomplishing meaningful goals. Target Date: 2022-02-22 Frequency: Biweekly Modality: individual Progress: 0%  Related Problem: Resolve conflicted feelings and adapt to the new life  circumstances. Description: Implement new activities that increase a sense of satisfaction. Target Date: 2022-02-22 Frequency: Biweekly Modality: individual Progress: 0% Planned Intervention: Develop a plan with the client to include activities that will increase his/her satisfaction, fulfill his/her values, and improve the quality of his/her life.  Related Problem: Resolve conflicted feelings and adapt to the new life circumstances. Description: Implement increased assertiveness to take control of conflicts. Target Date: 2022-02-22 Frequency: Biweekly Modality: individual Progress: 0%  Related Problem: Resolve conflicted feelings and adapt to the new life circumstances. Description: Apply problem-solving skills to current circumstances. Target Date: 2022-02-22 Frequency: Biweekly Modality: individual Progress: 0%  Related Problem: Resolve conflicted feelings and adapt to the new life circumstances. Description: Increase communication with significant others regarding current life stress factors. Target Date: 2022-02-22 Frequency: Biweekly Modality: individual Progress: 0%  Related Problem: Resolve conflicted feelings and adapt to the new life circumstances. Description: Implement changes in time and effort allocation to restore balance to life. Target Date: 2022-02-22 Frequency: Biweekly Modality: individual Progress: 0%  Related Problem: Resolve conflicted feelings and adapt to the new life circumstances. Description: Increase activities that reinforce a positive self-identity. Target Date: 2022-02-22 Frequency: Biweekly Modality: individual Progress: 0% Planned Intervention: Assist the client in clarifying his/her identity and meaning in life by listing his/her strengths, positive traits and talents, potential ways to contribute to society, and areas of interest and ability that have not yet been developed (or assign "What's Good About Me and My Life?" from the Adult  Psychotherapy Homework Planner by Bryn Gulling).  Planned Intervention: Develop an action plan with the client to increase activities that give meaning and expand his/her sense of identity at a time of transition in life phases (e.g., single to married, employed to homemaker, childless to parent, employed to retired); Engineer, building services; suggest the client read material on transitioning in life (e.g., Managing Transitions: Making the Most of Change or Transitions: Making Sense of Life's Changes by [employer]).  Related Problem: Resolve conflicted feelings and adapt to the new life circumstances. Description: Share emotional struggles related to current adjustment stress. Target Date: 2022-02-22 Frequency: Biweekly Modality: individual Progress: 0%  Client Response full compliance  Service Location Location, [address] [place], [place] [address]  Service Code cpt [phone]) Facilitate problem solving  Identify automatic thoughts  Rationally challenge thoughts or beliefs/cognitive restructuring  Validate/empathize  Identify/label emotions  Communication analysis (IPT)  Clarify interpersonal incident (IPT)  Communication Skills  Identified an insight Mindfulness Comments  Today I met with  Timothy [name] in remote video (WebEx) face-to-face individual psychotherapy as an accomodation to the COVID-19 Pandemic.  Distance Site: Client's Home Orginating Site: Dr Jannifer Franklin Remote Office Consent: Obtained verbal consent to transmit  session remotely    Timothy [name] reports that he was at the office until 9:30 pm the last two nights.  Work has been very stressful and he shared what he has been dealing with.  It's help to motivate him to take those first steps towards beginning a new career.  We d/e/p what steps he has taken and laid out some next steps.     Progress: Patient is experiencing an increased level of anxiety and depression aeb low to moderate level of low mood states, negative  and anxious ruminations. He fluctuates in his ability to confront difficult situations/subject which are normally his main anxiety triggers.   Royetta Crochet, PhD

## 2022-01-10 ENCOUNTER — Ambulatory Visit: Payer: Federal, State, Local not specified - PPO | Admitting: Psychology

## 2022-01-24 ENCOUNTER — Ambulatory Visit (INDEPENDENT_AMBULATORY_CARE_PROVIDER_SITE_OTHER): Payer: Federal, State, Local not specified - PPO | Admitting: Psychology

## 2022-01-24 DIAGNOSIS — F411 Generalized anxiety disorder: Secondary | ICD-10-CM

## 2022-02-07 ENCOUNTER — Ambulatory Visit (INDEPENDENT_AMBULATORY_CARE_PROVIDER_SITE_OTHER): Payer: Federal, State, Local not specified - PPO | Admitting: Psychology

## 2022-02-07 DIAGNOSIS — F411 Generalized anxiety disorder: Secondary | ICD-10-CM | POA: Diagnosis not present

## 2022-02-07 NOTE — Progress Notes (Signed)
PROGRESS NOTE  Patient Name: Nasier Thumm  Date of Birth: 09/01/1975  MRN: [id number]   Date of Service: 02/07/2022 Start Time: 08:00 AM End Time: 08:55 AM  Annual Review: 02/22/2022  Diagnosis Z60.0 (Phase of life problem)  311 (Other depressive disorder)  F41.1 (Generalized anxiety disorder)  Symptoms Autonomic hyperactivity (e.g., palpitations, shortness of breath, dry mouth, trouble swallowing, nausea, diarrhea).  Depressed or irritable mood.  Excessive and/or unrealistic worry that is difficult to control occurring more days than not for at least 6 months about a number of events or activities.  Feelings of hopelessness, worthlessness, or inappropriate guilt.  Hypervigilance (e.g., feeling constantly on edge, experiencing concentration difficulties, having trouble falling or staying asleep, exhibiting a general state of irritability).  Low self-esteem.  Motor tension (e.g., restlessness, tiredness, shakiness, muscle tension).  Restlessness and feelings of lost identity and meaning due to retirement.   Medication Status na  Safety none  If Suicidal or Homicidal State Action Taken: unspecified  Current Risk: low Medications unspecified Objectives Related Problem: Develop healthy interpersonal relationships that lead to the alleviation and help prevent the relapse of depression. Description: Learn and implement behavioral strategies to overcome depression. Target Date: 2022-02-22 Frequency: Biweekly Modality: individual Progress: 40%  Related Problem: Develop healthy interpersonal relationships that lead to the alleviation and help prevent the relapse of depression. Description: Identify and replace thoughts and beliefs that support depression. Target Date: 2022-02-22 Frequency: Biweekly Modality: individual Progress: 40%  Related Problem: Develop healthy interpersonal relationships that lead to the alleviation and help prevent the relapse of  depression. Description: Identify important people in life, past and present, and describe the quality, good and poor, of those relationships. Target Date: 2022-02-22 Frequency: Biweekly Modality: individual Progress: 60%  Related Problem: Develop healthy interpersonal relationships that lead to the alleviation and help prevent the relapse of depression. Description: Verbalize insight into how past relationships may be influencing current experiences with depression. Target Date: 2022-02-22 Frequency: Biweekly Modality: individual Progress: 40%  Related Problem: Develop healthy interpersonal relationships that lead to the alleviation and help prevent the relapse of depression. Description: Learn and implement problem-solving and decision-making skills. Target Date: 2022-02-22 Frequency: Biweekly Modality: individual Progress: 0%  Related Problem: Develop healthy interpersonal relationships that lead to the alleviation and help prevent the relapse of depression. Description: Learn and implement conflict resolution skills to resolve interpersonal problems. Target Date: 2022-02-22 Frequency: Biweekly Modality: individual Progress: 0%  Related Problem: Develop healthy interpersonal relationships that lead to the alleviation and help prevent the relapse of depression. Description: Increasingly verbalize hopeful and positive statements regarding self, others, and the future. Target Date: 2022-02-22 Frequency: Biweekly Modality: individual Progress: 0%  Related Problem: Learn and implement coping skills that result in a reduction of anxiety and worry, and improved daily functioning. Description: Identify the major life conflicts from the past and present that form the basis for present anxiety. Target Date: 2022-02-22 Frequency: Biweekly Modality: individual Progress: 50%  Related Problem: Learn and implement coping skills that result in a reduction of anxiety and worry, and  improved daily functioning. Description: Maintain involvement in work, family, and social activities. Target Date: 2022-02-23 Frequency: Biweekly Modality: individual Progress: 40%  Related Problem: Learn and implement coping skills that result in a reduction of anxiety and worry, and improved daily functioning. Description: Identify, challenge, and replace biased, fearful self-talk with positive, realistic, and empowering self-talk. Target Date: 2022-02-22 Frequency: Biweekly Modality: individual Progress: 0%  Related Problem: Learn and implement coping skills that result in a reduction  of anxiety and worry, and improved daily functioning. Description: Learn and implement problem-solving strategies for realistically addressing worries. Target Date: 2022-02-22 Frequency: Biweekly Modality: individual Progress: 0%  Related Problem: Learn and implement coping skills that result in a reduction of anxiety and worry, and improved daily functioning. Description: Identify and engage in pleasant activities on a daily basis. Target Date: 2022-02-22 Frequency: Biweekly Modality: individual Progress: 0%  Related Problem: Learn and implement coping skills that result in a reduction of anxiety and worry, and improved daily functioning. Description: Learn and implement personal and interpersonal skills to reduce anxiety and improve interpersonal relationships. Target Date: 2022-02-22 Frequency: Biweekly Modality: individual Progress: 0%  Related Problem: Learn and implement coping skills that result in a reduction of anxiety and worry, and improved daily functioning. Description: Learn to accept limitations in life and commit to tolerating, rather than avoiding, unpleasant emotions while accomplishing meaningful goals. Target Date: 2022-02-22 Frequency: Biweekly Modality: individual Progress: 0%  Related Problem: Resolve conflicted feelings and adapt to the new life  circumstances. Description: Implement new activities that increase a sense of satisfaction. Target Date: 2022-02-22 Frequency: Biweekly Modality: individual Progress: 0% Planned Intervention: Develop a plan with the client to include activities that will increase his/her satisfaction, fulfill his/her values, and improve the quality of his/her life.  Related Problem: Resolve conflicted feelings and adapt to the new life circumstances. Description: Implement increased assertiveness to take control of conflicts. Target Date: 2022-02-22 Frequency: Biweekly Modality: individual Progress: 0%  Related Problem: Resolve conflicted feelings and adapt to the new life circumstances. Description: Apply problem-solving skills to current circumstances. Target Date: 2022-02-22 Frequency: Biweekly Modality: individual Progress: 0%  Related Problem: Resolve conflicted feelings and adapt to the new life circumstances. Description: Increase communication with significant others regarding current life stress factors. Target Date: 2022-02-22 Frequency: Biweekly Modality: individual Progress: 0%  Related Problem: Resolve conflicted feelings and adapt to the new life circumstances. Description: Implement changes in time and effort allocation to restore balance to life. Target Date: 2022-02-22 Frequency: Biweekly Modality: individual Progress: 0%  Related Problem: Resolve conflicted feelings and adapt to the new life circumstances. Description: Increase activities that reinforce a positive self-identity. Target Date: 2022-02-22 Frequency: Biweekly Modality: individual Progress: 0% Planned Intervention: Assist the client in clarifying his/her identity and meaning in life by listing his/her strengths, positive traits and talents, potential ways to contribute to society, and areas of interest and ability that have not yet been developed (or assign "What's Good About Me and My Life?" from the Adult  Psychotherapy Homework Planner by Bryn [employer]).  Planned Intervention: Develop an action plan with the client to increase activities that give meaning and expand his/her sense of identity at a time of transition in life phases (e.g., single to married, employed to homemaker, childless to parent, employed to retired); Engineer, building services; suggest the client read material on transitioning in life (e.g., Managing Transitions: Making the Most of Change or Transitions: Making Sense of Life's Changes by [employer]).  Related Problem: Resolve conflicted feelings and adapt to the new life circumstances. Description: Share emotional struggles related to current adjustment stress. Target Date: 2022-02-22 Frequency: Biweekly Modality: individual Progress: 0%  Client Response full compliance  Service Location Location, [address] [place], [place] [address]  Service Code cpt [phone]) Facilitate problem solving  Identify automatic thoughts  Rationally challenge thoughts or beliefs/cognitive restructuring  Validate/empathize  Identify/label emotions  Communication analysis (IPT)  Clarify interpersonal incident (IPT)  Communication Skills  Identified an insight Mindfulness Comments  Today I met with  [name] in remote video (WebEx) face-to-face individual psychotherapy.  Distance Site: Client's Home Orginating Site: Dr Jannifer Franklin Remote Office Consent: Obtained verbal consent to transmit  session remotely     Lemuel reports that his supervisor has been out of the office for two weeks and the other office manager for one week.  He shared that it was a stressful week.  We d/ how he manage and what he might have done differently to manage his anxiety/stress.   Progress: Patient is experiencing an increased level of anxiety and depression aeb low to moderate level of low mood states, negative and anxious ruminations. He fluctuates in his ability to confront difficult situations/subject  which are normally his main anxiety triggers.   Royetta Crochet, PhD

## 2022-02-21 ENCOUNTER — Ambulatory Visit: Payer: Federal, State, Local not specified - PPO | Admitting: Psychology

## 2022-03-07 ENCOUNTER — Ambulatory Visit (INDEPENDENT_AMBULATORY_CARE_PROVIDER_SITE_OTHER): Payer: Federal, State, Local not specified - PPO | Admitting: Psychology

## 2022-03-07 DIAGNOSIS — F411 Generalized anxiety disorder: Secondary | ICD-10-CM | POA: Diagnosis not present

## 2022-03-07 DIAGNOSIS — F3342 Major depressive disorder, recurrent, in full remission: Secondary | ICD-10-CM | POA: Diagnosis not present

## 2022-03-07 DIAGNOSIS — Z600 Problems of adjustment to life-cycle transitions: Secondary | ICD-10-CM | POA: Diagnosis not present

## 2022-03-07 NOTE — Progress Notes (Addendum)
PROGRESS NOTE: Annual Review  Name: Timothy [name] Date: 03/07/2022 MRN: [id number] DOB: 20-Apr-1976 PCP: Aretta Nip, MD  Time spent: 8:00 - 8:55 AM  Annual Review: 03/08/2023   Today I met with  Timothy [name] in remote video (WebEx) face-to-face individual psychotherapy as an accomodation to the COVID-19 Pandemic.  Distance Site: Client's Home Orginating Site: Dr Jannifer Franklin Remote Office Consent: Obtained verbal consent to transmit  session remotely   In session today, we reviewed treatment progress, d/ goals and updated her treatment plan.  Timothy [name] participated in the creation of her treatment plan and gave her consent.   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 WMM who started therapy several years ago seeking treatment for depression and anxiety.  Since the start of treatment, depression has resolved with periodic mild reoccurrences.  Anxiety however, has proven to be chronic and effects all areas of his life.  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  We have begun the process of discerning next steps in his career and professional development.  Timothy [name] is also aging and he is beginning to see the signs of the need for additional support.   Mental Status Exam: Appearance:   Casual and Fairly Groomed     Behavior:  Appropriate  Motor:  Normal  Speech/Language:   NA  Affect:  Appropriate  Mood:  anxious and irritable  Thought process:  normal  Thought content:    WNL  Sensory/Perceptual disturbances:    WNL  Orientation:  oriented to person, place, time/date, and situation  Attention:  Good  Concentration:  Good  Memory:  WNL  Fund of knowledge:   Good  Insight:    Good  Judgment:   Good  Impulse Control:  Good   Risk Assessment: Danger to Self:  No Self-injurious Behavior: No Danger to Others: No Duty to Warn:no Physical Aggression / Violence:No  Access to Firearms a concern: No   Substance  Abuse History: Current substance abuse: No     Past Psychiatric History:   Previous psychological history is significant for anxiety and depression Outpatient Providers: current therapist History of Psych Hospitalization: No  Psychological Testing:  n/a    Abuse History:  Victim of: No.,  n/a    Report needed: No. Victim of Neglect:No. Witness / Exposure to Domestic Violence: No   Protective Services Involvement: No  Witness to [employer] Violence:  No   Family History:  Family History  Problem Relation Age of Onset   Heart attack Neg Hx    Hypertension Father    Stroke Neg Hx    Schizophrenia Father    Diabetes [name]     Living situation: the patient lives with their family  Sexual Orientation: Straight  Relationship Status: married  Name of spouse / other: [name] "[name]"  If a parent, number of children / ages: Timothy [name] (12) they/them  Support Systems:  Spouse [name] and Teaching laboratory technician Stress:  Yes   Income/Employment/Disability: Employment  Armed forces logistics/support/administrative officer: No   Educational History: Education: Scientist, product/process development: N/a  Any cultural differences that may affect / interfere with treatment:  not applicable   Recreation/Hobbies: D&D, movies  Stressors: Family Conflict, Aging parent  Barriers:  None   Legal History: Pending legal issue / charges: The patient has no significant history of legal issues. History of legal issue / charges:  n/a  Medical History/Surgical History: reviewed Past Medical History:  Diagnosis Date  AKI (acute kidney injury) ([place]) 12/22/2015   Anxiety    Diabetes mellitus ([place])    Essential hypertension    started on lisinopril about 45 days prior to pancreatitis   Obesity    Pancreatitis, acute 12/22/2015   a. 11/2015 - acute severe pancreatitis with shock/sepsis, complicated by acute respiratory failure, transaminitis, anemia of critical illness, acute  encephalopathy, AKI (Cr 1.36), protein-calorie malnutrition with severe hypoalbuminemia, & hypokalemia.   Splenic vein thrombosis     Past Surgical History:  Procedure Laterality Date   BACK SURGERY     ERCP N/A 01/31/2016   Procedure: ENDOSCOPIC RETROGRADE CHOLANGIOPANCREATOGRAPHY (ERCP);  Surgeon: Carol Ada, MD;  Location: [facility] ENDOSCOPY;  Service: Endoscopy;  Laterality: N/A;    Medications: Current Outpatient Medications  Medication Sig Dispense Refill   acetaminophen (TYLENOL) 325 MG tablet Take 650 mg by mouth every 6 (six) hours as needed for fever.     albuterol (PROVENTIL HFA;VENTOLIN HFA) 108 (90 Base) MCG/ACT inhaler Inhale 1-2 puffs into the lungs every 6 (six) hours as needed for wheezing or shortness of breath. 1 Inhaler 0   blood glucose meter kit and supplies Dispense based on patient and insurance preference. Use up to four times daily as directed. (FOR ICD-9 250.00, 250.01). 1 each 0   carvedilol (COREG) 25 MG tablet Take 1 tablet (25 mg total) by mouth 2 (two) times daily. 180 tablet 3   cetirizine (ZYRTEC) 10 MG tablet Take 10 mg by mouth at bedtime.     fluticasone (FLONASE) 50 MCG/ACT nasal spray Place 1 spray into both nostrils at bedtime.     guaiFENesin-codeine 100-10 MG/5ML syrup Take 5 mLs by mouth at bedtime as needed for cough. 120 mL 0   HYDROmorphone (DILAUDID) 1 MG/ML injection Inject 1 mL (1 mg total) into the vein every 4 (four) hours as needed for severe pain. 1 mL 0   imipenem-cilastatin 500 mg in sodium chloride 0.9 % 100 mL Inject 500 mg into the vein every 6 (six) hours.     insulin aspart (NOVOLOG) 100 UNIT/ML FlexPen 8 units with meals (as long as eating 50%) PLUS the following scale: CBG 70 - 120: 0 units CBG 121 - 150: 2 units CBG 151 - 200: 3 units CBG 201 - 250: 5 units CBG 251 - 300: 8 units CBG 301 - 350: 11 units CBG 351 - 400: 15 units (Patient taking differently: Inject 8 Units into the skin 3 (three) times daily with meals. 8 units with  meals (as long as eating 50%) PLUS the following scale: CBG 70 - 120: 0 units CBG 121 - 150: 2 units CBG 151 - 200: 3 units CBG 201 - 250: 5 units CBG 251 - 300: 8 units CBG 301 - 350: 11 units CBG 351 - 400: 15 units) 60 mL 11   Insulin Glargine (LANTUS SOLOSTAR) 100 UNIT/ML Solostar Pen Inject 25 Units into the skin daily at 10 pm. 15 mL 11   Insulin Pen Needle 31G X 5 MM MISC For insulin injections 100 each 1   pantoprazole (PROTONIX) 40 MG tablet Take 1 tablet (40 mg total) by mouth at bedtime. 30 tablet 0   No current facility-administered medications for this visit.    No Known Allergies  Diagnoses:  Generalized anxiety disorder   Individualized Treatment Plan                Strengths: verbal, bright, friendly  Supports: wife, daughter, parents   Goal/Needs for Treatment:  In  order of importance to patient 1)  Resolve conflicted feelings, takes steps to change work situation and adapt to the new life circumstances.   2) Learn and implement coping skills that result in a reduction of anxiety and worry, and improved daily functioning.  3) Increase activities that reinforce a positive self-identity.  4) Learn and implement coping skills that result in a prevention of of relapse of depression and improve daily functioning.    Client Statement of Needs: work on pervasive issues with anxiety, assistance in making a big life change as he contemplates retiring from current position and figures out new direction   Treatment Level: Individual Biweekly Outpatient Psychotherapy  Symptoms: Autonomic hyperactivity (e.g., palpitations, shortness of breath, dry mouth, trouble swallowing, nausea, diarrhea).  Periodic periods of depressed or irritable mood.  Excessive and/or unrealistic worry that is difficult to control occurring more days than not for at least 6 months about a number of events or activities.  Feelings of hopelessness, worthlessness, or inappropriate guilt.   Hypervigilance (e.g., feeling constantly on edge, experiencing concentration difficulties, having trouble falling or staying asleep, exhibiting a general state of irritability).  Low self-esteem.  Behavioral passivity, difficulties initiating action Motor tension (e.g., restlessness, tiredness, shakiness, muscle tension).  Restlessness and feelings of lost identity and meaning due to retirement.   Client Treatment Preferences: continue with current therapist   Healthcare consumer's goal for treatment:  Psychologist, Royetta Crochet, Ph.D. will support the patient's ability to achieve the goals identified. Cognitive Behavioral Therapy, Dialectical Behavioral Therapy, Motivational Interviewing, Behavior Activation, parent skills, and other evidenced-based practices will be used to promote progress towards healthy functioning.   Healthcare consumer Nehemias Sauceda will: Actively participate in therapy, working towards healthy functioning.    *Justification for Continuation/Discontinuation of Goal: R=Revised, O=Ongoing, A=Achieved, D=Discontinued  Goal 1) Resolve conflicted feelings, takes steps to change work situation and adapt to the new life circumstances.   5 Point Likert rating baseline date: 02/22/2021 Target Date Goal Was reviewed Status Code Progress towards goal/Likert rating  03/08/2023 03/07/2022          O 2/5 - pt has taken initial steps but at times falls into passivity and has made little progress             Goal 2) Learn and implement coping skills that result in a reduction of anxiety and worry, and improved daily functioning.  5 Point Likert rating baseline date: 02/23/2015 Target Date Goal Was reviewed Status Code Progress towards goal/Likert rating  03/08/2023 03/07/2022         O 3/5 - pt has learned coping skills and uses them appropriately but inconsistently             Goal 3) Increase activities that reinforce a positive self-identity.   5 Point Likert rating  baseline date: 02/23/2015 Target Date Goal Was reviewed Status Code Progress towards goal/Likert rating  03/08/2023 03/07/2022         O 2/5 - pt has taken initial steps to introduce new activities that have improved his feelings of positive self esteem             Goal 4) Learn and implement coping skills that result in a prevention of of relapse of depression and improve  daily functioning.   5  Point Likert rating baseline date: 02/23/2015 Target Date Goal Was reviewed Status Code Progress towards goal/Likert rating  03/08/2023 03/07/2022           O  4/5 -  pt has learned coping skills and uses them appropriately but inconsistently              This plan has been reviewed and created by the following participants:  This plan will be reviewed at least every 12 months. Date Behavioral Health Clinician Date Guardian/Patient   03/07/2022  Royetta Crochet, Ph.D.   03/07/2022 Timothy [name]                    Royetta Crochet, PhD

## 2022-03-07 NOTE — Addendum Note (Signed)
Addended by: Marin Olp ANN on: 03/07/2022 03:18 PM   Modules accepted: Level of Service

## 2022-03-21 ENCOUNTER — Ambulatory Visit (INDEPENDENT_AMBULATORY_CARE_PROVIDER_SITE_OTHER): Payer: Federal, State, Local not specified - PPO | Admitting: Psychology

## 2022-03-21 DIAGNOSIS — F411 Generalized anxiety disorder: Secondary | ICD-10-CM

## 2022-03-21 NOTE — Progress Notes (Signed)
PROGRESS NOTE:  Name: Timothy [name] Date: 03/21/2022 MRN: [id number] DOB: 06-03-1976 PCP: Aretta Nip, MD  Time spent: 8:00 - 8:55 AM  Annual Review: 03/08/2023   Today I met with  Timothy [name] in remote video (WebEx) face-to-face individual psychotherapy as an accomodation to the COVID-19 Pandemic.  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  Since the start of treatment, depression has resolved with periodic mild reoccurrences.  Anxiety however, has proven to be chronic and effects all areas of his life.  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  We have begun the process of discerning next steps in his career and professional development.  Timothy [name]'s mother is also aging and he is beginning to see the signs of the need for additional support.   Mental Status Exam: Appearance:   Casual and Fairly Groomed     Behavior:  Appropriate  Motor:  Normal  Speech/Language:   NA  Affect:  Appropriate  Mood:  anxious and irritable  Thought process:  normal  Thought content:    WNL  Sensory/Perceptual disturbances:    WNL  Orientation:  oriented to person, place, time/date, and situation  Attention:  Good  Concentration:  Good  Memory:  WNL  Fund of knowledge:   Good  Insight:    Good  Judgment:   Good  Impulse Control:  Good   Risk Assessment: Danger to Self:  No Self-injurious Behavior: No Danger to Others: No Duty to Warn:no Physical Aggression / Violence:No  Access to Firearms a concern: No   Substance Abuse History: Current substance abuse: No     Past Psychiatric History:   Previous psychological history is significant for anxiety and depression Outpatient Providers: current  therapist History of Psych Hospitalization: No  Psychological Testing:  n/a    Abuse History:  Victim of: No.,  n/a    Report needed: No. Victim of Neglect:No. Witness / Exposure to Domestic Violence: No   Protective Services Involvement: No  Witness to [employer] Violence:  No   Family History:  Family History  Problem Relation Age of Onset   Heart attack Neg Hx    Hypertension Father    Stroke Neg Hx    Schizophrenia Father    Diabetes Mother     Living situation: the patient lives with their family  Sexual Orientation: Straight  Relationship Status: married  Name of spouse / other: [name] "[name]"  If a parent, number of children / ages: [name] (12) they/them  Support Systems:  Spouse Friends Mother and Teaching laboratory technician Stress:  Yes   Income/Employment/Disability: Employment  [employer]ed forces logistics/support/administrative officer: No   Educational History: Education: Scientist, product/process development: N/a  Any cultural differences that may affect / interfere with treatment:  not applicable   Recreation/Hobbies: D&D, movies  Stressors: Family Conflict, Aging parent  Barriers:  None   Legal History: Pending legal issue / charges: The patient has no significant history of legal issues. History of legal issue / charges:  n/a  Medical History/Surgical History: reviewed Past Medical History:  Diagnosis Date   AKI (acute kidney injury) ([place]) 12/22/2015   Anxiety    Diabetes mellitus ([place])    Essential hypertension    started on lisinopril about 45 days  prior to pancreatitis   Obesity    Pancreatitis, acute 12/22/2015   a. 11/2015 - acute severe pancreatitis with shock/sepsis, complicated by acute respiratory failure, transaminitis, anemia of critical illness, acute encephalopathy, AKI (Cr 1.36), protein-calorie malnutrition with severe hypoalbuminemia, & hypokalemia.   Splenic vein thrombosis     Past Surgical History:  Procedure Laterality Date   BACK  SURGERY     ERCP N/A 01/31/2016   Procedure: ENDOSCOPIC RETROGRADE CHOLANGIOPANCREATOGRAPHY (ERCP);  Surgeon: Carol Ada, MD;  Location: [facility] ENDOSCOPY;  Service: Endoscopy;  Laterality: N/A;    Medications: Current Outpatient Medications  Medication Sig Dispense Refill   acetaminophen (TYLENOL) 325 MG tablet Take 650 mg by mouth every 6 (six) hours as needed for fever.     albuterol (PROVENTIL HFA;VENTOLIN HFA) 108 (90 Base) MCG/ACT inhaler Inhale 1-2 puffs into the lungs every 6 (six) hours as needed for wheezing or shortness of breath. 1 Inhaler 0   blood glucose meter kit and supplies Dispense based on patient and insurance preference. Use up to four times daily as directed. (FOR ICD-9 250.00, 250.01). 1 each 0   carvedilol (COREG) 25 MG tablet Take 1 tablet (25 mg total) by mouth 2 (two) times daily. 180 tablet 3   cetirizine (ZYRTEC) 10 MG tablet Take 10 mg by mouth at bedtime.     fluticasone (FLONASE) 50 MCG/ACT nasal spray Place 1 spray into both nostrils at bedtime.     guaiFENesin-codeine 100-10 MG/5ML syrup Take 5 mLs by mouth at bedtime as needed for cough. 120 mL 0   HYDROmorphone (DILAUDID) 1 MG/ML injection Inject 1 mL (1 mg total) into the vein every 4 (four) hours as needed for severe pain. 1 mL 0   imipenem-cilastatin 500 mg in sodium chloride 0.9 % 100 mL Inject 500 mg into the vein every 6 (six) hours.     insulin aspart (NOVOLOG) 100 UNIT/ML FlexPen 8 units with meals (as long as eating 50%) PLUS the following scale: CBG 70 - 120: 0 units CBG 121 - 150: 2 units CBG 151 - 200: 3 units CBG 201 - 250: 5 units CBG 251 - 300: 8 units CBG 301 - 350: 11 units CBG 351 - 400: 15 units (Patient taking differently: Inject 8 Units into the skin 3 (three) times daily with meals. 8 units with meals (as long as eating 50%) PLUS the following scale: CBG 70 - 120: 0 units CBG 121 - 150: 2 units CBG 151 - 200: 3 units CBG 201 - 250: 5 units CBG 251 - 300: 8 units CBG 301 - 350: 11  units CBG 351 - 400: 15 units) 60 mL 11   Insulin Glargine (LANTUS SOLOSTAR) 100 UNIT/ML Solostar Pen Inject 25 Units into the skin daily at 10 pm. 15 mL 11   Insulin Pen Needle 31G X 5 MM MISC For insulin injections 100 each 1   pantoprazole (PROTONIX) 40 MG tablet Take 1 tablet (40 mg total) by mouth at bedtime. 30 tablet 0   No current facility-administered medications for this visit.    No Known Allergies  Diagnoses:  Generalized anxiety disorder   Individualized Treatment Plan                Strengths: verbal, bright, friendly  Supports: wife, daughter, parents   Goal/Needs for Treatment:  In order of importance to patient 1)  Resolve conflicted feelings, takes steps to change work situation and adapt to the new life circumstances.   2) Learn and implement  coping skills that result in a reduction of anxiety and worry, and improved daily functioning.  3) Increase activities that reinforce a positive self-identity.  4) Learn and implement coping skills that result in a prevention of of relapse of depression and improve daily functioning.    Client Statement of Needs: work on pervasive issues with anxiety, assistance in making a big life change as he contemplates retiring from current position and figures out new direction   Treatment Level: Individual Biweekly Outpatient Psychotherapy  Symptoms: Autonomic hyperactivity (e.g., palpitations, shortness of breath, dry mouth, trouble swallowing, nausea, diarrhea).  Periodic periods of depressed or irritable mood.  Excessive and/or unrealistic worry that is difficult to control occurring more days than not for at least 6 months about a number of events or activities.  Feelings of hopelessness, worthlessness, or inappropriate guilt.  Hypervigilance (e.g., feeling constantly on edge, experiencing concentration difficulties, having trouble falling or staying asleep, exhibiting a general state of irritability).  Low self-esteem.   Behavioral passivity, difficulties initiating action Motor tension (e.g., restlessness, tiredness, shakiness, muscle tension).  Restlessness and feelings of lost identity and meaning due to retirement.   Client Treatment Preferences: continue with current therapist   Healthcare consumer's goal for treatment:  Psychologist, Royetta Crochet, Ph.D. will support the patient's ability to achieve the goals identified. Cognitive Behavioral Therapy, Dialectical Behavioral Therapy, Motivational Interviewing, Behavior Activation, parent skills, and other evidenced-based practices will be used to promote progress towards healthy functioning.   Healthcare consumer Lawyer Washabaugh will: Actively participate in therapy, working towards healthy functioning.    *Justification for Continuation/Discontinuation of Goal: R=Revised, O=Ongoing, A=Achieved, D=Discontinued  Goal 1) Resolve conflicted feelings, takes steps to change work situation and adapt to the new life circumstances.   5 Point Likert rating baseline date: 02/22/2021 Target Date Goal Was reviewed Status Code Progress towards goal/Likert rating  03/08/2023 03/07/2022          O 2/5 - pt has taken initial steps but at times falls into passivity and has made little progress             Goal 2) Learn and implement coping skills that result in a reduction of anxiety and worry, and improved daily functioning.  5 Point Likert rating baseline date: 02/23/2015 Target Date Goal Was reviewed Status Code Progress towards goal/Likert rating  03/08/2023 03/07/2022         O 3/5 - pt has learned coping skills and uses them appropriately but inconsistently             Goal 3) Increase activities that reinforce a positive self-identity.   5 Point Likert rating baseline date: 02/23/2015 Target Date Goal Was reviewed Status Code Progress towards goal/Likert rating  03/08/2023 03/07/2022         O 2/5 - pt has taken initial steps to introduce new activities  that have improved his feelings of positive self esteem             Goal 4) Learn and implement coping skills that result in a prevention of of relapse of depression and improve  daily functioning.   5  Point Likert rating baseline date: 02/23/2015 Target Date Goal Was reviewed Status Code Progress towards goal/Likert rating  03/08/2023 03/07/2022           O  4/5 - pt has learned coping skills and uses them appropriately but inconsistently              This plan has been reviewed  and created by the following participants:  This plan will be reviewed at least every 12 months. Date Behavioral Health Clinician Date Guardian/Patient   03/07/2022  Royetta Crochet, Ph.D.   03/07/2022 Timothy [name]                    Devon reports that his brother reached out to him.  He admits that he has been anxious that his brother was going to ask for money.  He was pleasantly surprised that he wanted to talk about their mother, her aging and the likelihood that he was going to move [place].  We d/e/p his brother's deteriorating physical health and concerns about leaving his mother alone in [place].  I encouraged Overton to speak with his aunts, their past interest in having his mother move down to [place] and their up coming trip.  Right now he is making a number of assumptions and I noted the need to have clear communication.  Jaan states that he is tiring of not knowing what to do next.  He seemed to be circling around the same thoughts and I intervened to help move things forward.     Royetta Crochet, PhD

## 2022-04-07 ENCOUNTER — Ambulatory Visit: Payer: Federal, State, Local not specified - PPO | Admitting: Psychology

## 2022-04-18 ENCOUNTER — Ambulatory Visit: Payer: Federal, State, Local not specified - PPO | Admitting: Psychology

## 2022-04-18 ENCOUNTER — Ambulatory Visit (INDEPENDENT_AMBULATORY_CARE_PROVIDER_SITE_OTHER): Payer: Federal, State, Local not specified - PPO | Admitting: Psychology

## 2022-04-18 DIAGNOSIS — F411 Generalized anxiety disorder: Secondary | ICD-10-CM

## 2022-04-18 NOTE — Progress Notes (Signed)
PROGRESS NOTE:  Name: Timothy [name] Date: 04/18/2022 MRN: [id number] DOB: Dec 04, 1975 PCP: Aretta Nip, MD  Time spent: 8:00 - 8:55 AM  Annual Review: 03/08/2023   Today I met with  Timothy [name] in remote video (WebEx) face-to-face individual psychotherapy as an accomodation to the COVID-19 Pandemic.  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  Since the start of treatment, depression has resolved with periodic mild reoccurrences.  Anxiety however, has proven to be chronic and effects all areas of his life.  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  We have begun the process of discerning next steps in his career and professional development.  Timothy [name]'s mother is also aging and he is beginning to see the signs of the need for additional support.   Mental Status Exam: Appearance:   Casual and Fairly Groomed     Behavior:  Appropriate  Motor:  Normal  Speech/Language:   NA  Affect:  Appropriate  Mood:  anxious and irritable  Thought process:  normal  Thought content:    WNL  Sensory/Perceptual disturbances:    WNL  Orientation:  oriented to person, place, time/date, and situation  Attention:  Good  Concentration:  Good  Memory:  WNL  Fund of knowledge:   Good  Insight:    Good  Judgment:   Good  Impulse Control:  Good   Risk Assessment: Danger to Self:  No Self-injurious Behavior: No Danger to Others: No Duty to Warn:no Physical Aggression / Violence:No  Access to Firearms a concern: No   Substance Abuse History: Current substance abuse: No     Past Psychiatric History:   Previous psychological history is significant for anxiety and depression Outpatient Providers: current  therapist History of Psych Hospitalization: No  Psychological Testing:  n/a    Abuse History:  Victim of: No.,  n/a    Report needed: No. Victim of Neglect:No. Witness / Exposure to Domestic Violence: No   Protective Services Involvement: No  Witness to [employer] Violence:  No   Family History:  Family History  Problem Relation Age of Onset   Heart attack Neg Hx    Hypertension Father    Stroke Neg Hx    Schizophrenia Father    Diabetes Mother     Living situation: the patient lives with their family  Sexual Orientation: Straight  Relationship Status: married  Name of spouse / other: Timothy [name] "Timothy [name]"  If a parent, number of children / ages: Timothy [name] (12) they/them  Support Systems:  Spouse Friends Mother and Teaching laboratory technician Stress:  Yes   Income/Employment/Disability: Employment  Armed forces logistics/support/administrative officer: No   Educational History: Education: Scientist, product/process development: N/a  Any cultural differences that may affect / interfere with treatment:  not applicable   Recreation/Hobbies: D&D, movies  Stressors: Family Conflict, Aging parent  Barriers:  None   Legal History: Pending legal issue / charges: The patient has no significant history of legal issues. History of legal issue / charges:  n/a  Medical History/Surgical History: reviewed Past Medical History:  Diagnosis Date   AKI (acute kidney injury) ([place]) 12/22/2015   Anxiety    Diabetes mellitus ([place])    Essential hypertension    started on lisinopril about 45 days  prior to pancreatitis   Obesity    Pancreatitis, acute 12/22/2015   a. 11/2015 - acute severe pancreatitis with shock/sepsis, complicated by acute respiratory failure, transaminitis, anemia of critical illness, acute encephalopathy, AKI (Cr 1.36), protein-calorie malnutrition with severe hypoalbuminemia, & hypokalemia.   Splenic vein thrombosis     Past Surgical History:  Procedure Laterality Date   BACK  SURGERY     ERCP N/A 01/31/2016   Procedure: ENDOSCOPIC RETROGRADE CHOLANGIOPANCREATOGRAPHY (ERCP);  Surgeon: Carol Ada, MD;  Location: [facility] ENDOSCOPY;  Service: Endoscopy;  Laterality: N/A;    Medications: Current Outpatient Medications  Medication Sig Dispense Refill   acetaminophen (TYLENOL) 325 MG tablet Take 650 mg by mouth every 6 (six) hours as needed for fever.     albuterol (PROVENTIL HFA;VENTOLIN HFA) 108 (90 Base) MCG/ACT inhaler Inhale 1-2 puffs into the lungs every 6 (six) hours as needed for wheezing or shortness of breath. 1 Inhaler 0   blood glucose meter kit and supplies Dispense based on patient and insurance preference. Use up to four times daily as directed. (FOR ICD-9 250.00, 250.01). 1 each 0   carvedilol (COREG) 25 MG tablet Take 1 tablet (25 mg total) by mouth 2 (two) times daily. 180 tablet 3   cetirizine (ZYRTEC) 10 MG tablet Take 10 mg by mouth at bedtime.     fluticasone (FLONASE) 50 MCG/ACT nasal spray Place 1 spray into both nostrils at bedtime.     guaiFENesin-codeine 100-10 MG/5ML syrup Take 5 mLs by mouth at bedtime as needed for cough. 120 mL 0   HYDROmorphone (DILAUDID) 1 MG/ML injection Inject 1 mL (1 mg total) into the vein every 4 (four) hours as needed for severe pain. 1 mL 0   imipenem-cilastatin 500 mg in sodium chloride 0.9 % 100 mL Inject 500 mg into the vein every 6 (six) hours.     insulin aspart (NOVOLOG) 100 UNIT/ML FlexPen 8 units with meals (as long as eating 50%) PLUS the following scale: CBG 70 - 120: 0 units CBG 121 - 150: 2 units CBG 151 - 200: 3 units CBG 201 - 250: 5 units CBG 251 - 300: 8 units CBG 301 - 350: 11 units CBG 351 - 400: 15 units (Patient taking differently: Inject 8 Units into the skin 3 (three) times daily with meals. 8 units with meals (as long as eating 50%) PLUS the following scale: CBG 70 - 120: 0 units CBG 121 - 150: 2 units CBG 151 - 200: 3 units CBG 201 - 250: 5 units CBG 251 - 300: 8 units CBG 301 - 350: 11  units CBG 351 - 400: 15 units) 60 mL 11   Insulin Glargine (LANTUS SOLOSTAR) 100 UNIT/ML Solostar Pen Inject 25 Units into the skin daily at 10 pm. 15 mL 11   Insulin Pen Needle 31G X 5 MM MISC For insulin injections 100 each 1   pantoprazole (PROTONIX) 40 MG tablet Take 1 tablet (40 mg total) by mouth at bedtime. 30 tablet 0   No current facility-administered medications for this visit.    No Known Allergies  Diagnoses:  Generalized anxiety disorder   Individualized Treatment Plan                Strengths: verbal, bright, friendly  Supports: wife, daughter, parents   Goal/Needs for Treatment:  In order of importance to patient 1)  Resolve conflicted feelings, takes steps to change work situation and adapt to the new life circumstances.   2) Learn and implement  coping skills that result in a reduction of anxiety and worry, and improved daily functioning.  3) Increase activities that reinforce a positive self-identity.  4) Learn and implement coping skills that result in a prevention of of relapse of depression and improve daily functioning.    Client Statement of Needs: work on pervasive issues with anxiety, assistance in making a big life change as he contemplates retiring from current position and figures out new direction   Treatment Level: Individual Biweekly Outpatient Psychotherapy  Symptoms: Autonomic hyperactivity (e.g., palpitations, shortness of breath, dry mouth, trouble swallowing, nausea, diarrhea).  Periodic periods of depressed or irritable mood.  Excessive and/or unrealistic worry that is difficult to control occurring more days than not for at least 6 months about a number of events or activities.  Feelings of hopelessness, worthlessness, or inappropriate guilt.  Hypervigilance (e.g., feeling constantly on edge, experiencing concentration difficulties, having trouble falling or staying asleep, exhibiting a general state of irritability).  Low self-esteem.   Behavioral passivity, difficulties initiating action Motor tension (e.g., restlessness, tiredness, shakiness, muscle tension).  Restlessness and feelings of lost identity and meaning due to retirement.   Client Treatment Preferences: continue with current therapist   Healthcare consumer's goal for treatment:  Psychologist, Royetta Crochet, Ph.D. will support the patient's ability to achieve the goals identified. Cognitive Behavioral Therapy, Dialectical Behavioral Therapy, Motivational Interviewing, Behavior Activation, parent skills, and other evidenced-based practices will be used to promote progress towards healthy functioning.   Healthcare consumer Lawyer Washabaugh will: Actively participate in therapy, working towards healthy functioning.    *Justification for Continuation/Discontinuation of Goal: R=Revised, O=Ongoing, A=Achieved, D=Discontinued  Goal 1) Resolve conflicted feelings, takes steps to change work situation and adapt to the new life circumstances.   5 Point Likert rating baseline date: 02/22/2021 Target Date Goal Was reviewed Status Code Progress towards goal/Likert rating  03/08/2023 03/07/2022          O 2/5 - pt has taken initial steps but at times falls into passivity and has made little progress             Goal 2) Learn and implement coping skills that result in a reduction of anxiety and worry, and improved daily functioning.  5 Point Likert rating baseline date: 02/23/2015 Target Date Goal Was reviewed Status Code Progress towards goal/Likert rating  03/08/2023 03/07/2022         O 3/5 - pt has learned coping skills and uses them appropriately but inconsistently             Goal 3) Increase activities that reinforce a positive self-identity.   5 Point Likert rating baseline date: 02/23/2015 Target Date Goal Was reviewed Status Code Progress towards goal/Likert rating  03/08/2023 03/07/2022         O 2/5 - pt has taken initial steps to introduce new activities  that have improved his feelings of positive self esteem             Goal 4) Learn and implement coping skills that result in a prevention of of relapse of depression and improve  daily functioning.   5  Point Likert rating baseline date: 02/23/2015 Target Date Goal Was reviewed Status Code Progress towards goal/Likert rating  03/08/2023 03/07/2022           O  4/5 - pt has learned coping skills and uses them appropriately but inconsistently              This plan has been reviewed  and created by the following participants:  This plan will be reviewed at least every 12 months. Date Behavioral Health Clinician Date Guardian/Patient   03/07/2022  Royetta Crochet, Ph.D.   03/07/2022 Timothy [name]                    Keland reports that he got a call from a headhunter a couple of weeks ago.  We talked about the process of interviewing and how it fits with his career goals and worked through the anxieties that he is having about accepting a new position.  By the end of the session, he was feeling more confident and comfortable with managing his anxious  thoughts.   Royetta Crochet, PhD

## 2022-05-02 ENCOUNTER — Ambulatory Visit: Payer: Federal, State, Local not specified - PPO | Admitting: Psychology

## 2022-05-16 ENCOUNTER — Ambulatory Visit (INDEPENDENT_AMBULATORY_CARE_PROVIDER_SITE_OTHER): Payer: Federal, State, Local not specified - PPO | Admitting: Psychology

## 2022-05-16 DIAGNOSIS — F411 Generalized anxiety disorder: Secondary | ICD-10-CM | POA: Diagnosis not present

## 2022-05-16 NOTE — Progress Notes (Addendum)
PROGRESS NOTE:  Name: Timothy [name] Date: 05/16/2022 MRN: [id number] DOB: 1976/03/09 PCP: Aretta Nip, MD  Time spent: 8:00 - 8:55 AM  Annual Review: 03/08/2023   Today I met with  Timothy [name] in remote video (WebEx) face-to-face individual psychotherapy as an accomodation to the COVID-19 Pandemic.  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  Since the start of treatment, depression has resolved with periodic mild reoccurrences.  Anxiety however, has proven to be chronic and effects all areas of his life.  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  We have begun the process of discerning next steps in his career and professional development.  Timothy [name] is also aging and he is beginning to see the signs of the need for additional support.   Mental Status Exam: Appearance:   Casual and Fairly Groomed     Behavior:  Appropriate  Motor:  Normal  Speech/Language:   NA  Affect:  Appropriate  Mood:  anxious and irritable  Thought process:  normal  Thought content:    WNL  Sensory/Perceptual disturbances:    WNL  Orientation:  oriented to person, place, time/date, and situation  Attention:  Good  Concentration:  Good  Memory:  WNL  Fund of knowledge:   Good  Insight:    Good  Judgment:   Good  Impulse Control:  Good   Risk Assessment: Danger to Self:  No Self-injurious Behavior: No Danger to Others: No Duty to Warn:no Physical Aggression / Violence:No  Access to Firearms a concern: No   Substance Abuse History: Current substance abuse: No     Past Psychiatric History:   Previous psychological history is significant for anxiety and depression Outpatient Providers: current  therapist History of Psych Hospitalization: No  Psychological Testing:  n/a    Abuse History:  Victim of: No.,  n/a    Report needed: No. Victim of Neglect:No. Witness / Exposure to Domestic Violence: No   Protective Services Involvement: No  Witness to [employer] Violence:  No   Family History:  Family History  Problem Relation Age of Onset   Heart attack Neg Hx    Hypertension Father    Stroke Neg Hx    Schizophrenia Father    Diabetes [name]     Living situation: the patient lives with their family  Sexual Orientation: Straight  Relationship Status: married  Name of spouse / other: Timothy Mola "[name]"  If a parent, number of children / ages: Timothy [name] (12) they/them  Support Systems:  Spouse [name] and Teaching laboratory technician Stress:  Yes   Income/Employment/Disability: Employment  [employer]ed forces logistics/support/administrative officer: No   Educational History: Education: Scientist, product/process development: N/a  Any cultural differences that may affect / interfere with treatment:  not applicable   Recreation/Hobbies: D&D, movies  Stressors: Family Conflict, Aging parent  Barriers:  None   Legal History: Pending legal issue / charges: The patient has no significant history of legal issues. History of legal issue / charges:  n/a  Medical History/Surgical History: reviewed Past Medical History:  Diagnosis Date   AKI (acute kidney injury) ([place]) 12/22/2015   Anxiety    Diabetes mellitus ([place])    Essential hypertension    started on lisinopril about 45  days prior to pancreatitis   Obesity    Pancreatitis, acute 12/22/2015   a. 11/2015 - acute severe pancreatitis with shock/sepsis, complicated by acute respiratory failure, transaminitis, anemia of critical illness, acute encephalopathy, AKI (Cr 1.36), protein-calorie malnutrition with severe hypoalbuminemia, & hypokalemia.   Splenic vein thrombosis     Past Surgical History:  Procedure Laterality Date   BACK  SURGERY     ERCP N/A 01/31/2016   Procedure: ENDOSCOPIC RETROGRADE CHOLANGIOPANCREATOGRAPHY (ERCP);  Surgeon: Carol Ada, MD;  Location: [facility] ENDOSCOPY;  Service: Endoscopy;  Laterality: N/A;    Medications: Current Outpatient Medications  Medication Sig Dispense Refill   acetaminophen (TYLENOL) 325 MG tablet Take 650 mg by mouth every 6 (six) hours as needed for fever.     albuterol (PROVENTIL HFA;VENTOLIN HFA) 108 (90 Base) MCG/ACT inhaler Inhale 1-2 puffs into the lungs every 6 (six) hours as needed for wheezing or shortness of breath. 1 Inhaler 0   blood glucose meter kit and supplies Dispense based on patient and insurance preference. Use up to four times daily as directed. (FOR ICD-9 250.00, 250.01). 1 each 0   carvedilol (COREG) 25 MG tablet Take 1 tablet (25 mg total) by mouth 2 (two) times daily. 180 tablet 3   cetirizine (ZYRTEC) 10 MG tablet Take 10 mg by mouth at bedtime.     fluticasone (FLONASE) 50 MCG/ACT nasal spray Place 1 spray into both nostrils at bedtime.     guaiFENesin-codeine 100-10 MG/5ML syrup Take 5 mLs by mouth at bedtime as needed for cough. 120 mL 0   HYDROmorphone (DILAUDID) 1 MG/ML injection Inject 1 mL (1 mg total) into the vein every 4 (four) hours as needed for severe pain. 1 mL 0   imipenem-cilastatin 500 mg in sodium chloride 0.9 % 100 mL Inject 500 mg into the vein every 6 (six) hours.     insulin aspart (NOVOLOG) 100 UNIT/ML FlexPen 8 units with meals (as long as eating 50%) PLUS the following scale: CBG 70 - 120: 0 units CBG 121 - 150: 2 units CBG 151 - 200: 3 units CBG 201 - 250: 5 units CBG 251 - 300: 8 units CBG 301 - 350: 11 units CBG 351 - 400: 15 units (Patient taking differently: Inject 8 Units into the skin 3 (three) times daily with meals. 8 units with meals (as long as eating 50%) PLUS the following scale: CBG 70 - 120: 0 units CBG 121 - 150: 2 units CBG 151 - 200: 3 units CBG 201 - 250: 5 units CBG 251 - 300: 8 units CBG 301 - 350: 11  units CBG 351 - 400: 15 units) 60 mL 11   Insulin Glargine (LANTUS SOLOSTAR) 100 UNIT/ML Solostar Pen Inject 25 Units into the skin daily at 10 pm. 15 mL 11   Insulin Pen Needle 31G X 5 MM MISC For insulin injections 100 each 1   pantoprazole (PROTONIX) 40 MG tablet Take 1 tablet (40 mg total) by mouth at bedtime. 30 tablet 0   No current facility-administered medications for this visit.    No Known Allergies  Diagnoses:  Generalized Anxiety Disorder Phase of Life Change - adult   Individualized Treatment Plan                Strengths: verbal, bright, friendly  Supports: wife, daughter, parents   Goal/Needs for Treatment:  In order of importance to patient 1)  Resolve conflicted feelings, takes steps to change work situation and adapt to the new life  circumstances.   2) Learn and implement coping skills that result in a reduction of anxiety and worry, and improved daily functioning.  3) Increase activities that reinforce a positive self-identity.  4) Learn and implement coping skills that result in a prevention of of relapse of depression and improve daily functioning.    Client Statement of Needs: work on pervasive issues with anxiety, assistance in making a big life change as he contemplates retiring from current position and figures out new direction   Treatment Level: Individual Biweekly Outpatient Psychotherapy  Symptoms: Autonomic hyperactivity (e.g., palpitations, shortness of breath, dry mouth, trouble swallowing, nausea, diarrhea).  Periodic periods of depressed or irritable mood.  Excessive and/or unrealistic worry that is difficult to control occurring more days than not for at least 6 months about a number of events or activities.  Feelings of hopelessness, worthlessness, or inappropriate guilt.  Hypervigilance (e.g., feeling constantly on edge, experiencing concentration difficulties, having trouble falling or staying asleep, exhibiting a general state of  irritability).  Low self-esteem.  Behavioral passivity, difficulties initiating action Motor tension (e.g., restlessness, tiredness, shakiness, muscle tension).  Restlessness and feelings of lost identity and meaning due to retirement.   Client Treatment Preferences: continue with current therapist   Healthcare consumer's goal for treatment:  Psychologist, Royetta Crochet, Ph.D. will support the patient's ability to achieve the goals identified. Cognitive Behavioral Therapy, Dialectical Behavioral Therapy, Motivational Interviewing, Behavior Activation, parent skills, and other evidenced-based practices will be used to promote progress towards healthy functioning.   Healthcare consumer Timothy [name] will: Actively participate in therapy, working towards healthy functioning.    *Justification for Continuation/Discontinuation of Goal: R=Revised, O=Ongoing, A=Achieved, D=Discontinued  Goal 1) Resolve conflicted feelings, takes steps to change work situation and adapt to the new life circumstances.   5 Point Likert rating baseline date: 02/22/2021 Target Date Goal Was reviewed Status Code Progress towards goal/Likert rating  03/08/2023 03/07/2022          O 2/5 - pt has taken initial steps but at times falls into passivity and has made little progress             Goal 2) Learn and implement coping skills that result in a reduction of anxiety and worry, and improved daily functioning.  5 Point Likert rating baseline date: 02/23/2015 Target Date Goal Was reviewed Status Code Progress towards goal/Likert rating  03/08/2023 03/07/2022         O 3/5 - pt has learned coping skills and uses them appropriately but inconsistently             Goal 3) Increase activities that reinforce a positive self-identity.   5 Point Likert rating baseline date: 02/23/2015 Target Date Goal Was reviewed Status Code Progress towards goal/Likert rating  03/08/2023 03/07/2022         O 2/5 - pt has taken initial  steps to introduce new activities that have improved his feelings of positive self esteem             Goal 4) Learn and implement coping skills that result in a prevention of of relapse of depression and improve  daily functioning.   5  Point Likert rating baseline date: 02/23/2015 Target Date Goal Was reviewed Status Code Progress towards goal/Likert rating  03/08/2023 03/07/2022           O  4/5 - pt has learned coping skills and uses them appropriately but inconsistently  This plan has been reviewed and created by the following participants:  This plan will be reviewed at least every 12 months. Date Behavioral Health Clinician Date Guardian/Patient   03/07/2022  Royetta Crochet, Ph.D.   03/07/2022 Timothy [name]                    Davonte reports that the company, which the has been hoping to work for, he has been slow to follow up. He shared that he was quite anxious and fell into over thinking.  We d/p how he managed his anxious thinking and noted where he has shown improvement.  He was able to identify that he has gotten better at pushing aside his resistance to starting a task.  He is using his self talk more effectively and this has helped him a great deal.  Nitin still struggles with anticipatory anxiety.  We d/ how to use these same skills to better manage his anticipatory anxiety.   Royetta Crochet, PhD

## 2022-05-30 ENCOUNTER — Ambulatory Visit (INDEPENDENT_AMBULATORY_CARE_PROVIDER_SITE_OTHER): Payer: Federal, State, Local not specified - PPO | Admitting: Psychology

## 2022-05-30 DIAGNOSIS — F411 Generalized anxiety disorder: Secondary | ICD-10-CM

## 2022-05-30 NOTE — Progress Notes (Signed)
PROGRESS NOTE:  Name: Timothy [name] Date: 05/30/2022 MRN: [id number] DOB: [birth year] PCP: Aretta Nip, MD  Time spent: 8:00 - 8:55 AM  Annual Review: 03/08/2023   Today I met with  Timothy [name] in remote video (WebEx) face-to-face individual psychotherapy as an accomodation to the COVID-19 Pandemic.  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  Since the start of treatment, depression has resolved with periodic mild reoccurrences.  Anxiety however, has proven to be chronic and effects all areas of his life.  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  We have begun the process of discerning next steps in his career and professional development.  Timothy [name]'s mother is also aging and he is beginning to see the signs of the need for additional support.   Mental Status Exam: Appearance:   Casual and Fairly Groomed     Behavior:  Appropriate  Motor:  Normal  Speech/Language:   NA  Affect:  Appropriate  Mood:  anxious and irritable  Thought process:  normal  Thought content:    WNL  Sensory/Perceptual disturbances:    WNL  Orientation:  oriented to person, place, time/date, and situation  Attention:  Good  Concentration:  Good  Memory:  WNL  Fund of knowledge:   Good  Insight:    Good  Judgment:   Good  Impulse Control:  Good   Risk Assessment: Danger to Self:  No Self-injurious Behavior: No Danger to Others: No Duty to Warn:no Physical Aggression / Violence:No  Access to Firearms a concern: No   Substance Abuse History: Current substance abuse: No     Past Psychiatric History:   Previous psychological history is significant for anxiety and depression Outpatient Providers: current  therapist History of Psych Hospitalization: No  Psychological Testing:  n/a    Abuse History:  Victim of: No.,  n/a    Report needed: No. Victim of Neglect:No. Witness / Exposure to Domestic Violence: No   Protective Services Involvement: No  Witness to [employer] Violence:  No   Family History:  Family History  Problem Relation Age of Onset   Heart attack Neg Hx    Hypertension Father    Stroke Neg Hx    Schizophrenia Father    Diabetes Mother     Living situation: the patient lives with their family  Sexual Orientation: Straight  Relationship Status: married  Name of spouse / other: [name] "[name]"  If a parent, number of children / ages: [name] (12) they/them  Support Systems:  Spouse Friends Mother and Teaching laboratory technician Stress:  Yes   Income/Employment/Disability: Employment  Armed forces logistics/support/administrative officer: No   Educational History: Education: Scientist, product/process development: N/a  Any cultural differences that may affect / interfere with treatment:  not applicable   Recreation/Hobbies: D&D, movies  Stressors: Family Conflict, Aging parent  Barriers:  None   Legal History: Pending legal issue / charges: The patient has no significant history of legal issues. History of legal issue / charges:  n/a  Medical History/Surgical History: reviewed Past Medical History:  Diagnosis Date   AKI (acute kidney injury) ([place]) 12/22/2015   Anxiety    Diabetes mellitus ([place])    Essential hypertension    started on lisinopril about 45 days  prior to pancreatitis   Obesity    Pancreatitis, acute 12/22/2015   a. 11/2015 - acute severe pancreatitis with shock/sepsis, complicated by acute respiratory failure, transaminitis, anemia of critical illness, acute encephalopathy, AKI (Cr 1.36), protein-calorie malnutrition with severe hypoalbuminemia, & hypokalemia.   Splenic vein thrombosis     Past Surgical History:  Procedure Laterality Date   BACK  SURGERY     ERCP N/A 01/31/2016   Procedure: ENDOSCOPIC RETROGRADE CHOLANGIOPANCREATOGRAPHY (ERCP);  Surgeon: Carol Ada, MD;  Location: [facility] ENDOSCOPY;  Service: Endoscopy;  Laterality: N/A;    Medications: Current Outpatient Medications  Medication Sig Dispense Refill   acetaminophen (TYLENOL) 325 MG tablet Take 650 mg by mouth every 6 (six) hours as needed for fever.     albuterol (PROVENTIL HFA;VENTOLIN HFA) 108 (90 Base) MCG/ACT inhaler Inhale 1-2 puffs into the lungs every 6 (six) hours as needed for wheezing or shortness of breath. 1 Inhaler 0   blood glucose meter kit and supplies Dispense based on patient and insurance preference. Use up to four times daily as directed. (FOR ICD-9 250.00, 250.01). 1 each 0   carvedilol (COREG) 25 MG tablet Take 1 tablet (25 mg total) by mouth 2 (two) times daily. 180 tablet 3   cetirizine (ZYRTEC) 10 MG tablet Take 10 mg by mouth at bedtime.     fluticasone (FLONASE) 50 MCG/ACT nasal spray Place 1 spray into both nostrils at bedtime.     guaiFENesin-codeine 100-10 MG/5ML syrup Take 5 mLs by mouth at bedtime as needed for cough. 120 mL 0   HYDROmorphone (DILAUDID) 1 MG/ML injection Inject 1 mL (1 mg total) into the vein every 4 (four) hours as needed for severe pain. 1 mL 0   imipenem-cilastatin 500 mg in sodium chloride 0.9 % 100 mL Inject 500 mg into the vein every 6 (six) hours.     insulin aspart (NOVOLOG) 100 UNIT/ML FlexPen 8 units with meals (as long as eating 50%) PLUS the following scale: CBG 70 - 120: 0 units CBG 121 - 150: 2 units CBG 151 - 200: 3 units CBG 201 - 250: 5 units CBG 251 - 300: 8 units CBG 301 - 350: 11 units CBG 351 - 400: 15 units (Patient taking differently: Inject 8 Units into the skin 3 (three) times daily with meals. 8 units with meals (as long as eating 50%) PLUS the following scale: CBG 70 - 120: 0 units CBG 121 - 150: 2 units CBG 151 - 200: 3 units CBG 201 - 250: 5 units CBG 251 - 300: 8 units CBG 301 - 350: 11  units CBG 351 - 400: 15 units) 60 mL 11   Insulin Glargine (LANTUS SOLOSTAR) 100 UNIT/ML Solostar Pen Inject 25 Units into the skin daily at 10 pm. 15 mL 11   Insulin Pen Needle 31G X 5 MM MISC For insulin injections 100 each 1   pantoprazole (PROTONIX) 40 MG tablet Take 1 tablet (40 mg total) by mouth at bedtime. 30 tablet 0   No current facility-administered medications for this visit.    No Known Allergies  Diagnoses:  Generalized Anxiety Disorder Phase of Life Change - adult   Individualized Treatment Plan                Strengths: verbal, bright, friendly  Supports: wife, daughter, parents   Goal/Needs for Treatment:  In order of importance to patient 1)  Resolve conflicted feelings, takes steps to change work situation and adapt to the new life circumstances.  2) Learn and implement coping skills that result in a reduction of anxiety and worry, and improved daily functioning.  3) Increase activities that reinforce a positive self-identity.  4) Learn and implement coping skills that result in a prevention of of relapse of depression and improve daily functioning.    Client Statement of Needs: work on pervasive issues with anxiety, assistance in making a big life change as he contemplates retiring from current position and figures out new direction   Treatment Level: Individual Biweekly Outpatient Psychotherapy  Symptoms: Autonomic hyperactivity (e.g., palpitations, shortness of breath, dry mouth, trouble swallowing, nausea, diarrhea).  Periodic periods of depressed or irritable mood.  Excessive and/or unrealistic worry that is difficult to control occurring more days than not for at least 6 months about a number of events or activities.  Feelings of hopelessness, worthlessness, or inappropriate guilt.  Hypervigilance (e.g., feeling constantly on edge, experiencing concentration difficulties, having trouble falling or staying asleep, exhibiting a general state of  irritability).  Low self-esteem.  Behavioral passivity, difficulties initiating action Motor tension (e.g., restlessness, tiredness, shakiness, muscle tension).  Restlessness and feelings of lost identity and meaning due to retirement.   Client Treatment Preferences: continue with current therapist   Healthcare consumer's goal for treatment:  Psychologist, Royetta Crochet, Ph.D. will support the patient's ability to achieve the goals identified. Cognitive Behavioral Therapy, Dialectical Behavioral Therapy, Motivational Interviewing, Behavior Activation, parent skills, and other evidenced-based practices will be used to promote progress towards healthy functioning.   Healthcare consumer Dwayn Moravek will: Actively participate in therapy, working towards healthy functioning.    *Justification for Continuation/Discontinuation of Goal: R=Revised, O=Ongoing, A=Achieved, D=Discontinued  Goal 1) Resolve conflicted feelings, takes steps to change work situation and adapt to the new life circumstances.   5 Point Likert rating baseline date: 02/22/2021 Target Date Goal Was reviewed Status Code Progress towards goal/Likert rating  03/08/2023 03/07/2022          O 2/5 - pt has taken initial steps but at times falls into passivity and has made little progress             Goal 2) Learn and implement coping skills that result in a reduction of anxiety and worry, and improved daily functioning.  5 Point Likert rating baseline date: 02/23/2015 Target Date Goal Was reviewed Status Code Progress towards goal/Likert rating  03/08/2023 03/07/2022         O 3/5 - pt has learned coping skills and uses them appropriately but inconsistently             Goal 3) Increase activities that reinforce a positive self-identity.   5 Point Likert rating baseline date: 02/23/2015 Target Date Goal Was reviewed Status Code Progress towards goal/Likert rating  03/08/2023 03/07/2022         O 2/5 - pt has taken initial  steps to introduce new activities that have improved his feelings of positive self esteem             Goal 4) Learn and implement coping skills that result in a prevention of of relapse of depression and improve  daily functioning.   5  Point Likert rating baseline date: 02/23/2015 Target Date Goal Was reviewed Status Code Progress towards goal/Likert rating  03/08/2023 03/07/2022           O  4/5 - pt has learned coping skills and uses them appropriately but inconsistently              This  plan has been reviewed and created by the following participants:  This plan will be reviewed at least every 12 months. Date Behavioral Health Clinician Date Guardian/Patient   03/07/2022  Royetta Crochet, Ph.D.   03/07/2022 Timothy [name]                    Javin reports that he had an initial meeting with a private the company that helps individuals applying for social security.  We d/e/p how things went, the possibilities for employment and future steps.  He is trying to stay optimistic about his prospect once he leaves SS.  Lastly we d/p Chadric's mother's upcoming holiday visit, anticipated issues and how to be proactive in addressing concerns.   Royetta Crochet, PhD

## 2022-06-13 ENCOUNTER — Ambulatory Visit: Payer: Federal, State, Local not specified - PPO | Admitting: Psychology

## 2022-06-27 ENCOUNTER — Ambulatory Visit (INDEPENDENT_AMBULATORY_CARE_PROVIDER_SITE_OTHER): Payer: Federal, State, Local not specified - PPO | Admitting: Psychology

## 2022-06-27 DIAGNOSIS — F411 Generalized anxiety disorder: Secondary | ICD-10-CM

## 2022-06-27 NOTE — Progress Notes (Signed)
PROGRESS NOTE:  Name: Timothy [name] Date: 06/27/2022 MRN: [id number] DOB: 10-Nov-1975 PCP: Aretta Nip, MD  Time spent: 8:00 - 8:55 AM  Annual Review: 03/08/2023   Today I met with  Timothy [name] in remote video (WebEx) face-to-face individual psychotherapy as an accomodation to the COVID-19 Pandemic.  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  Since the start of treatment, depression has resolved with periodic mild reoccurrences.  Anxiety however, has proven to be chronic and effects all areas of his life.  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  We have begun the process of discerning next steps in his career and professional development.  Timothy [name]'s mother is also aging and he is beginning to see the signs of the need for additional support.   Mental Status Exam: Appearance:   Casual and Fairly Groomed     Behavior:  Appropriate  Motor:  Normal  Speech/Language:   NA  Affect:  Appropriate  Mood:  anxious and irritable  Thought process:  normal  Thought content:    WNL  Sensory/Perceptual disturbances:    WNL  Orientation:  oriented to person, place, time/date, and situation  Attention:  Good  Concentration:  Good  Memory:  WNL  Fund of knowledge:   Good  Insight:    Good  Judgment:   Good  Impulse Control:  Good   Risk Assessment: Danger to Self:  No Self-injurious Behavior: No Danger to Others: No Duty to Warn:no Physical Aggression / Violence:No  Access to Firearms a concern: No   Substance Abuse History: Current substance abuse: No     Past Psychiatric History:   Previous psychological history is significant for anxiety and depression Outpatient Providers: current  therapist History of Psych Hospitalization: No  Psychological Testing:  n/a    Abuse History:  Victim of: No.,  n/a    Report needed: No. Victim of Neglect:No. Witness / Exposure to Domestic Violence: No   Protective Services Involvement: No  Witness to [employer] Violence:  No   Family History:  Family History  Problem Relation Age of Onset   Heart attack Neg Hx    Hypertension Father    Stroke Neg Hx    Schizophrenia Father    Diabetes Mother     Living situation: the patient lives with their family  Sexual Orientation: Straight  Relationship Status: married  Name of spouse / other: [name] "[name]"  If a parent, number of children / ages: [name] (12) they/them  Support Systems:  Spouse Friends Mother and Teaching laboratory technician Stress:  Yes   Income/Employment/Disability: Employment  [employer] logistics/support/administrative officer: No   Educational History: Education: Scientist, product/process development: N/a  Any cultural differences that may affect / interfere with treatment:  not applicable   Recreation/Hobbies: D&D, movies  Stressors: Family Conflict, Aging parent  Barriers:  None   Legal History: Pending legal issue / charges: The patient has no significant history of legal issues. History of legal issue / charges:  n/a  Medical History/Surgical History: reviewed Past Medical History:  Diagnosis Date   AKI (acute kidney injury) ([place]) 12/22/2015   Anxiety    Diabetes mellitus ([place])    Essential hypertension    started on lisinopril about 45 days  prior to pancreatitis   Obesity    Pancreatitis, acute 12/22/2015   a. 11/2015 - acute severe pancreatitis with shock/sepsis, complicated by acute respiratory failure, transaminitis, anemia of critical illness, acute encephalopathy, AKI (Cr 1.36), protein-calorie malnutrition with severe hypoalbuminemia, & hypokalemia.   Splenic vein thrombosis     Past Surgical History:  Procedure Laterality Date   BACK  SURGERY     ERCP N/A 01/31/2016   Procedure: ENDOSCOPIC RETROGRADE CHOLANGIOPANCREATOGRAPHY (ERCP);  Surgeon: Carol Ada, MD;  Location: [facility] ENDOSCOPY;  Service: Endoscopy;  Laterality: N/A;    Medications: Current Outpatient Medications  Medication Sig Dispense Refill   acetaminophen (TYLENOL) 325 MG tablet Take 650 mg by mouth every 6 (six) hours as needed for fever.     albuterol (PROVENTIL HFA;VENTOLIN HFA) 108 (90 Base) MCG/ACT inhaler Inhale 1-2 puffs into the lungs every 6 (six) hours as needed for wheezing or shortness of breath. 1 Inhaler 0   blood glucose meter kit and supplies Dispense based on patient and insurance preference. Use up to four times daily as directed. (FOR ICD-9 250.00, 250.01). 1 each 0   carvedilol (COREG) 25 MG tablet Take 1 tablet (25 mg total) by mouth 2 (two) times daily. 180 tablet 3   cetirizine (ZYRTEC) 10 MG tablet Take 10 mg by mouth at bedtime.     fluticasone (FLONASE) 50 MCG/ACT nasal spray Place 1 spray into both nostrils at bedtime.     guaiFENesin-codeine 100-10 MG/5ML syrup Take 5 mLs by mouth at bedtime as needed for cough. 120 mL 0   HYDROmorphone (DILAUDID) 1 MG/ML injection Inject 1 mL (1 mg total) into the vein every 4 (four) hours as needed for severe pain. 1 mL 0   imipenem-cilastatin 500 mg in sodium chloride 0.9 % 100 mL Inject 500 mg into the vein every 6 (six) hours.     insulin aspart (NOVOLOG) 100 UNIT/ML FlexPen 8 units with meals (as long as eating 50%) PLUS the following scale: CBG 70 - 120: 0 units CBG 121 - 150: 2 units CBG 151 - 200: 3 units CBG 201 - 250: 5 units CBG 251 - 300: 8 units CBG 301 - 350: 11 units CBG 351 - 400: 15 units (Patient taking differently: Inject 8 Units into the skin 3 (three) times daily with meals. 8 units with meals (as long as eating 50%) PLUS the following scale: CBG 70 - 120: 0 units CBG 121 - 150: 2 units CBG 151 - 200: 3 units CBG 201 - 250: 5 units CBG 251 - 300: 8 units CBG 301 - 350: 11  units CBG 351 - 400: 15 units) 60 mL 11   Insulin Glargine (LANTUS SOLOSTAR) 100 UNIT/ML Solostar Pen Inject 25 Units into the skin daily at 10 pm. 15 mL 11   Insulin Pen Needle 31G X 5 MM MISC For insulin injections 100 each 1   pantoprazole (PROTONIX) 40 MG tablet Take 1 tablet (40 mg total) by mouth at bedtime. 30 tablet 0   No current facility-administered medications for this visit.    No Known Allergies  Diagnoses:  Generalized Anxiety Disorder Phase of Life Change - adult   Individualized Treatment Plan                Strengths: verbal, bright, friendly  Supports: wife, daughter, parents   Goal/Needs for Treatment:  In order of importance to patient 1)  Resolve conflicted feelings, takes steps to change work situation and adapt to the new life circumstances.  2) Learn and implement coping skills that result in a reduction of anxiety and worry, and improved daily functioning.  3) Increase activities that reinforce a positive self-identity.  4) Learn and implement coping skills that result in a prevention of of relapse of depression and improve daily functioning.    Client Statement of Needs: work on pervasive issues with anxiety, assistance in making a big life change as he contemplates retiring from current position and figures out new direction   Treatment Level: Individual Biweekly Outpatient Psychotherapy  Symptoms: Autonomic hyperactivity (e.g., palpitations, shortness of breath, dry mouth, trouble swallowing, nausea, diarrhea).  Periodic periods of depressed or irritable mood.  Excessive and/or unrealistic worry that is difficult to control occurring more days than not for at least 6 months about a number of events or activities.  Feelings of hopelessness, worthlessness, or inappropriate guilt.  Hypervigilance (e.g., feeling constantly on edge, experiencing concentration difficulties, having trouble falling or staying asleep, exhibiting a general state of  irritability).  Low self-esteem.  Behavioral passivity, difficulties initiating action Motor tension (e.g., restlessness, tiredness, shakiness, muscle tension).  Restlessness and feelings of lost identity and meaning due to retirement.   Client Treatment Preferences: continue with current therapist   Healthcare consumer's goal for treatment:  Psychologist, Royetta Crochet, Ph.D. will support the patient's ability to achieve the goals identified. Cognitive Behavioral Therapy, Dialectical Behavioral Therapy, Motivational Interviewing, Behavior Activation, parent skills, and other evidenced-based practices will be used to promote progress towards healthy functioning.   Healthcare consumer Akito Boomhower will: Actively participate in therapy, working towards healthy functioning.    *Justification for Continuation/Discontinuation of Goal: R=Revised, O=Ongoing, A=Achieved, D=Discontinued  Goal 1) Resolve conflicted feelings, takes steps to change work situation and adapt to the new life circumstances.   5 Point Likert rating baseline date: 02/22/2021 Target Date Goal Was reviewed Status Code Progress towards goal/Likert rating  03/08/2023 03/07/2022          O 2/5 - pt has taken initial steps but at times falls into passivity and has made little progress             Goal 2) Learn and implement coping skills that result in a reduction of anxiety and worry, and improved daily functioning.  5 Point Likert rating baseline date: 02/23/2015 Target Date Goal Was reviewed Status Code Progress towards goal/Likert rating  03/08/2023 03/07/2022         O 3/5 - pt has learned coping skills and uses them appropriately but inconsistently             Goal 3) Increase activities that reinforce a positive self-identity.   5 Point Likert rating baseline date: 02/23/2015 Target Date Goal Was reviewed Status Code Progress towards goal/Likert rating  03/08/2023 03/07/2022         O 2/5 - pt has taken initial  steps to introduce new activities that have improved his feelings of positive self esteem             Goal 4) Learn and implement coping skills that result in a prevention of of relapse of depression and improve  daily functioning.   5  Point Likert rating baseline date: 02/23/2015 Target Date Goal Was reviewed Status Code Progress towards goal/Likert rating  03/08/2023 03/07/2022           O  4/5 - pt has learned coping skills and uses them appropriately but inconsistently              This  plan has been reviewed and created by the following participants:  This plan will be reviewed at least every 12 months. Date Behavioral Health Clinician Date Guardian/Patient   03/07/2022  Royetta Crochet, Ph.D.   03/07/2022 Timothy [name]                    Torre reports that his visit with his mother over the holiday was better than anticipated.  We d/p the discrepancies between what his anxiety expected to occur and what actually happened.  Allex also shared a problem he had at work.  We d/p the report he received, his thoughts and objections.  I encouraged him to write a response and request specific incidents and action steps.  Jeren felt like he had a plan he could follow.   Royetta Crochet, PhD

## 2022-07-11 ENCOUNTER — Ambulatory Visit (INDEPENDENT_AMBULATORY_CARE_PROVIDER_SITE_OTHER): Payer: Federal, State, Local not specified - PPO | Admitting: Psychology

## 2022-07-11 DIAGNOSIS — F411 Generalized anxiety disorder: Secondary | ICD-10-CM | POA: Diagnosis not present

## 2022-07-11 NOTE — Progress Notes (Signed)
PROGRESS NOTE:  Name: Timothy [name] Date: 07/11/2022 MRN: [id number] DOB: 1976/07/24 PCP: Aretta Nip, MD  Time spent: 8:00 - 8:55 AM  Annual Review: 03/08/2023   Today I met with  Timothy [name] in remote video (WebEx) face-to-face individual psychotherapy as an accomodation to the COVID-19 Pandemic.  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  Since the start of treatment, depression has resolved with periodic mild reoccurrences.  Anxiety however, has proven to be chronic and effects all areas of his life.  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  We have begun the process of discerning next steps in his career and professional development.  Timothy [name]'s mother is also aging and he is beginning to see the signs of the need for additional support.   Mental Status Exam: Appearance:   Casual and Fairly Groomed     Behavior:  Appropriate  Motor:  Normal  Speech/Language:   NA  Affect:  Appropriate  Mood:  anxious and irritable  Thought process:  normal  Thought content:    WNL  Sensory/Perceptual disturbances:    WNL  Orientation:  oriented to person, place, time/date, and situation  Attention:  Good  Concentration:  Good  Memory:  WNL  Fund of knowledge:   Good  Insight:    Good  Judgment:   Good  Impulse Control:  Good   Risk Assessment: Danger to Self:  No Self-injurious Behavior: No Danger to Others: No Duty to Warn:no Physical Aggression / Violence:No  Access to Firearms a concern: No   Substance Abuse History: Current substance abuse: No     Past Psychiatric History:   Previous psychological history is significant for anxiety and depression Outpatient Providers: current  therapist History of Psych Hospitalization: No  Psychological Testing:  n/a    Abuse History:  Victim of: No.,  n/a    Report needed: No. Victim of Neglect:No. Witness / Exposure to Domestic Violence: No   Protective Services Involvement: No  Witness to [employer] Violence:  No   Family History:  Family History  Problem Relation Age of Onset   Heart attack Neg Hx    Hypertension Father    Stroke Neg Hx    Schizophrenia Father    Diabetes Mother     Living situation: the patient lives with their family  Sexual Orientation: Straight  Relationship Status: married  Name of spouse / other: [name] "[name]"  If a parent, number of children / ages: [name] (12) they/them  Support Systems:  Spouse Friends Mother and Teaching laboratory technician Stress:  Yes   Income/Employment/Disability: Employment  Armed forces logistics/support/administrative officer: No   Educational History: Education: Scientist, product/process development: N/a  Any cultural differences that may affect / interfere with treatment:  not applicable   Recreation/Hobbies: D&D, movies  Stressors: Family Conflict, Aging parent  Barriers:  None   Legal History: Pending legal issue / charges: The patient has no significant history of legal issues. History of legal issue / charges:  n/a  Medical History/Surgical History: reviewed Past Medical History:  Diagnosis Date   AKI (acute kidney injury) ([place]) 12/22/2015   Anxiety    Diabetes mellitus ([place])    Essential hypertension    started on lisinopril about 45 days  prior to pancreatitis   Obesity    Pancreatitis, acute 12/22/2015   a. 11/2015 - acute severe pancreatitis with shock/sepsis, complicated by acute respiratory failure, transaminitis, anemia of critical illness, acute encephalopathy, AKI (Cr 1.36), protein-calorie malnutrition with severe hypoalbuminemia, & hypokalemia.   Splenic vein thrombosis     Past Surgical History:  Procedure Laterality Date   BACK  SURGERY     ERCP N/A 01/31/2016   Procedure: ENDOSCOPIC RETROGRADE CHOLANGIOPANCREATOGRAPHY (ERCP);  Surgeon: Carol Ada, MD;  Location: [facility] ENDOSCOPY;  Service: Endoscopy;  Laterality: N/A;    Medications: Current Outpatient Medications  Medication Sig Dispense Refill   acetaminophen (TYLENOL) 325 MG tablet Take 650 mg by mouth every 6 (six) hours as needed for fever.     albuterol (PROVENTIL HFA;VENTOLIN HFA) 108 (90 Base) MCG/ACT inhaler Inhale 1-2 puffs into the lungs every 6 (six) hours as needed for wheezing or shortness of breath. 1 Inhaler 0   blood glucose meter kit and supplies Dispense based on patient and insurance preference. Use up to four times daily as directed. (FOR ICD-9 250.00, 250.01). 1 each 0   carvedilol (COREG) 25 MG tablet Take 1 tablet (25 mg total) by mouth 2 (two) times daily. 180 tablet 3   cetirizine (ZYRTEC) 10 MG tablet Take 10 mg by mouth at bedtime.     fluticasone (FLONASE) 50 MCG/ACT nasal spray Place 1 spray into both nostrils at bedtime.     guaiFENesin-codeine 100-10 MG/5ML syrup Take 5 mLs by mouth at bedtime as needed for cough. 120 mL 0   HYDROmorphone (DILAUDID) 1 MG/ML injection Inject 1 mL (1 mg total) into the vein every 4 (four) hours as needed for severe pain. 1 mL 0   imipenem-cilastatin 500 mg in sodium chloride 0.9 % 100 mL Inject 500 mg into the vein every 6 (six) hours.     insulin aspart (NOVOLOG) 100 UNIT/ML FlexPen 8 units with meals (as long as eating 50%) PLUS the following scale: CBG 70 - 120: 0 units CBG 121 - 150: 2 units CBG 151 - 200: 3 units CBG 201 - 250: 5 units CBG 251 - 300: 8 units CBG 301 - 350: 11 units CBG 351 - 400: 15 units (Patient taking differently: Inject 8 Units into the skin 3 (three) times daily with meals. 8 units with meals (as long as eating 50%) PLUS the following scale: CBG 70 - 120: 0 units CBG 121 - 150: 2 units CBG 151 - 200: 3 units CBG 201 - 250: 5 units CBG 251 - 300: 8 units CBG 301 - 350: 11  units CBG 351 - 400: 15 units) 60 mL 11   Insulin Glargine (LANTUS SOLOSTAR) 100 UNIT/ML Solostar Pen Inject 25 Units into the skin daily at 10 pm. 15 mL 11   Insulin Pen Needle 31G X 5 MM MISC For insulin injections 100 each 1   pantoprazole (PROTONIX) 40 MG tablet Take 1 tablet (40 mg total) by mouth at bedtime. 30 tablet 0   No current facility-administered medications for this visit.    No Known Allergies  Diagnoses:  Generalized Anxiety Disorder Phase of Life Change - adult   Individualized Treatment Plan                Strengths: verbal, bright, friendly  Supports: wife, daughter, parents   Goal/Needs for Treatment:  In order of importance to patient 1)  Resolve conflicted feelings, takes steps to change work situation and adapt to the new life circumstances.  2) Learn and implement coping skills that result in a reduction of anxiety and worry, and improved daily functioning.  3) Increase activities that reinforce a positive self-identity.  4) Learn and implement coping skills that result in a prevention of of relapse of depression and improve daily functioning.    Client Statement of Needs: work on pervasive issues with anxiety, assistance in making a big life change as he contemplates retiring from current position and figures out new direction   Treatment Level: Individual Biweekly Outpatient Psychotherapy  Symptoms: Autonomic hyperactivity (e.g., palpitations, shortness of breath, dry mouth, trouble swallowing, nausea, diarrhea).  Periodic periods of depressed or irritable mood.  Excessive and/or unrealistic worry that is difficult to control occurring more days than not for at least 6 months about a number of events or activities.  Feelings of hopelessness, worthlessness, or inappropriate guilt.  Hypervigilance (e.g., feeling constantly on edge, experiencing concentration difficulties, having trouble falling or staying asleep, exhibiting a general state of  irritability).  Low self-esteem.  Behavioral passivity, difficulties initiating action Motor tension (e.g., restlessness, tiredness, shakiness, muscle tension).  Restlessness and feelings of lost identity and meaning due to retirement.   Client Treatment Preferences: continue with current therapist   Healthcare consumer's goal for treatment:  Psychologist, Royetta Crochet, Ph.D. will support the patient's ability to achieve the goals identified. Cognitive Behavioral Therapy, Dialectical Behavioral Therapy, Motivational Interviewing, Behavior Activation, parent skills, and other evidenced-based practices will be used to promote progress towards healthy functioning.   Healthcare consumer Dwayn Moravek will: Actively participate in therapy, working towards healthy functioning.    *Justification for Continuation/Discontinuation of Goal: R=Revised, O=Ongoing, A=Achieved, D=Discontinued  Goal 1) Resolve conflicted feelings, takes steps to change work situation and adapt to the new life circumstances.   5 Point Likert rating baseline date: 02/22/2021 Target Date Goal Was reviewed Status Code Progress towards goal/Likert rating  03/08/2023 03/07/2022          O 2/5 - pt has taken initial steps but at times falls into passivity and has made little progress             Goal 2) Learn and implement coping skills that result in a reduction of anxiety and worry, and improved daily functioning.  5 Point Likert rating baseline date: 02/23/2015 Target Date Goal Was reviewed Status Code Progress towards goal/Likert rating  03/08/2023 03/07/2022         O 3/5 - pt has learned coping skills and uses them appropriately but inconsistently             Goal 3) Increase activities that reinforce a positive self-identity.   5 Point Likert rating baseline date: 02/23/2015 Target Date Goal Was reviewed Status Code Progress towards goal/Likert rating  03/08/2023 03/07/2022         O 2/5 - pt has taken initial  steps to introduce new activities that have improved his feelings of positive self esteem             Goal 4) Learn and implement coping skills that result in a prevention of of relapse of depression and improve  daily functioning.   5  Point Likert rating baseline date: 02/23/2015 Target Date Goal Was reviewed Status Code Progress towards goal/Likert rating  03/08/2023 03/07/2022           O  4/5 - pt has learned coping skills and uses them appropriately but inconsistently              This  plan has been reviewed and created by the following participants:  This plan will be reviewed at least every 12 months. Date Behavioral Health Clinician Date Guardian/Patient   03/07/2022  Royetta Crochet, Ph.D.   03/07/2022 Timothy [name]                    Lakeith reports that he cracked a crown over the weekend and he almost had a panic attack.  We d/e/p some early history related to dental care and issues that have persisted in the present.     Royetta Crochet, PhD

## 2022-08-08 ENCOUNTER — Ambulatory Visit: Payer: Federal, State, Local not specified - PPO | Admitting: Psychology

## 2022-08-22 ENCOUNTER — Ambulatory Visit (INDEPENDENT_AMBULATORY_CARE_PROVIDER_SITE_OTHER): Payer: Federal, State, Local not specified - PPO | Admitting: Psychology

## 2022-08-22 DIAGNOSIS — F411 Generalized anxiety disorder: Secondary | ICD-10-CM | POA: Diagnosis not present

## 2022-08-22 NOTE — Progress Notes (Signed)
PROGRESS NOTE:  Name: Timothy [name] Date: 08/22/2022 MRN: [id number] DOB: [birth year] PCP: Aretta Nip, MD  Time spent: 8:00 - 8:55 AM  Annual Review: 03/08/2023   Today I met with  Timothy [name] in remote video (WebEx) face-to-face individual psychotherapy as an accomodation to the COVID-19 Pandemic.  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  Since the start of treatment, depression has resolved with periodic mild reoccurrences.  Anxiety however, has proven to be chronic and effects all areas of his life.  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  We have begun the process of discerning next steps in his career and professional development.  [name]'s mother is also aging and he is beginning to see the signs of the need for additional support.   Mental Status Exam: Appearance:   Casual and Fairly Groomed     Behavior:  Appropriate  Motor:  Normal  Speech/Language:   NA  Affect:  Appropriate  Mood:  anxious and irritable  Thought process:  normal  Thought content:    WNL  Sensory/Perceptual disturbances:    WNL  Orientation:  oriented to person, place, time/date, and situation  Attention:  Good  Concentration:  Good  Memory:  WNL  Fund of knowledge:   Good  Insight:    Good  Judgment:   Good  Impulse Control:  Good   Risk Assessment: Danger to Self:  No Self-injurious Behavior: No Danger to Others: No Duty to Warn:no Physical Aggression / Violence:No  Access to Firearms a concern: No   Substance Abuse History: Current substance abuse: No     Past Psychiatric History:   Previous psychological history is significant for anxiety and depression Outpatient Providers: current  therapist History of Psych Hospitalization: No  Psychological Testing:  n/a    Abuse History:  Victim of: No.,  n/a    Report needed: No. Victim of Neglect:No. Witness / Exposure to Domestic Violence: No   Protective Services Involvement: No  Witness to [employer] Violence:  No   Family History:  Family History  Problem Relation Age of Onset   Heart attack Neg Hx    Hypertension Father    Stroke Neg Hx    Schizophrenia Father    Diabetes Mother     Living situation: the patient lives with their family  Sexual Orientation: Straight  Relationship Status: married  Name of spouse / other: [name] "[name]"  If a parent, number of children / ages: [name] (12) they/them  Support Systems:  Spouse Friends Mother and Teaching laboratory technician Stress:  Yes   Income/Employment/Disability: Employment  [employer]ed forces logistics/support/administrative officer: No   Educational History: Education: Scientist, product/process development: N/a  Any cultural differences that may affect / interfere with treatment:  not applicable   Recreation/Hobbies: D&D, movies  Stressors: Family Conflict, Aging parent  Barriers:  None   Legal History: Pending legal issue / charges: The patient has no significant history of legal issues. History of legal issue / charges:  n/a  Medical History/Surgical History: reviewed Past Medical History:  Diagnosis Date   AKI (acute kidney injury) ([place]) 12/22/2015   Anxiety    Diabetes mellitus ([place])    Essential hypertension    started on lisinopril about 45  days prior to pancreatitis   Obesity    Pancreatitis, acute 12/22/2015   a. 11/2015 - acute severe pancreatitis with shock/sepsis, complicated by acute respiratory failure, transaminitis, anemia of critical illness, acute encephalopathy, AKI (Cr 1.36), protein-calorie malnutrition with severe hypoalbuminemia, & hypokalemia.   Splenic vein thrombosis     Past Surgical History:  Procedure Laterality Date   BACK  SURGERY     ERCP N/A 01/31/2016   Procedure: ENDOSCOPIC RETROGRADE CHOLANGIOPANCREATOGRAPHY (ERCP);  Surgeon: Carol Ada, MD;  Location: [facility] ENDOSCOPY;  Service: Endoscopy;  Laterality: N/A;    Medications: Current Outpatient Medications  Medication Sig Dispense Refill   acetaminophen (TYLENOL) 325 MG tablet Take 650 mg by mouth every 6 (six) hours as needed for fever.     albuterol (PROVENTIL HFA;VENTOLIN HFA) 108 (90 Base) MCG/ACT inhaler Inhale 1-2 puffs into the lungs every 6 (six) hours as needed for wheezing or shortness of breath. 1 Inhaler 0   blood glucose meter kit and supplies Dispense based on patient and insurance preference. Use up to four times daily as directed. (FOR ICD-9 250.00, 250.01). 1 each 0   carvedilol (COREG) 25 MG tablet Take 1 tablet (25 mg total) by mouth 2 (two) times daily. 180 tablet 3   cetirizine (ZYRTEC) 10 MG tablet Take 10 mg by mouth at bedtime.     fluticasone (FLONASE) 50 MCG/ACT nasal spray Place 1 spray into both nostrils at bedtime.     guaiFENesin-codeine 100-10 MG/5ML syrup Take 5 mLs by mouth at bedtime as needed for cough. 120 mL 0   HYDROmorphone (DILAUDID) 1 MG/ML injection Inject 1 mL (1 mg total) into the vein every 4 (four) hours as needed for severe pain. 1 mL 0   imipenem-cilastatin 500 mg in sodium chloride 0.9 % 100 mL Inject 500 mg into the vein every 6 (six) hours.     insulin aspart (NOVOLOG) 100 UNIT/ML FlexPen 8 units with meals (as long as eating 50%) PLUS the following scale: CBG 70 - 120: 0 units CBG 121 - 150: 2 units CBG 151 - 200: 3 units CBG 201 - 250: 5 units CBG 251 - 300: 8 units CBG 301 - 350: 11 units CBG 351 - 400: 15 units (Patient taking differently: Inject 8 Units into the skin 3 (three) times daily with meals. 8 units with meals (as long as eating 50%) PLUS the following scale: CBG 70 - 120: 0 units CBG 121 - 150: 2 units CBG 151 - 200: 3 units CBG 201 - 250: 5 units CBG 251 - 300: 8 units CBG 301 - 350: 11  units CBG 351 - 400: 15 units) 60 mL 11   Insulin Glargine (LANTUS SOLOSTAR) 100 UNIT/ML Solostar Pen Inject 25 Units into the skin daily at 10 pm. 15 mL 11   Insulin Pen Needle 31G X 5 MM MISC For insulin injections 100 each 1   pantoprazole (PROTONIX) 40 MG tablet Take 1 tablet (40 mg total) by mouth at bedtime. 30 tablet 0   No current facility-administered medications for this visit.    No Known Allergies  Diagnoses:  Generalized Anxiety Disorder Phase of Life Change - adult   Individualized Treatment Plan                Strengths: verbal, bright, friendly  Supports: wife, daughter, parents   Goal/Needs for Treatment:  In order of importance to patient 1)  Resolve conflicted feelings, takes steps to change work situation and adapt to the new life  circumstances.   2) Learn and implement coping skills that result in a reduction of anxiety and worry, and improved daily functioning.  3) Increase activities that reinforce a positive self-identity.  4) Learn and implement coping skills that result in a prevention of of relapse of depression and improve daily functioning.    Client Statement of Needs: work on pervasive issues with anxiety, assistance in making a big life change as he contemplates retiring from current position and figures out new direction   Treatment Level: Individual Biweekly Outpatient Psychotherapy  Symptoms: Autonomic hyperactivity (e.g., palpitations, shortness of breath, dry mouth, trouble swallowing, nausea, diarrhea).  Periodic periods of depressed or irritable mood.  Excessive and/or unrealistic worry that is difficult to control occurring more days than not for at least 6 months about a number of events or activities.  Feelings of hopelessness, worthlessness, or inappropriate guilt.  Hypervigilance (e.g., feeling constantly on edge, experiencing concentration difficulties, having trouble falling or staying asleep, exhibiting a general state of  irritability).  Low self-esteem.  Behavioral passivity, difficulties initiating action Motor tension (e.g., restlessness, tiredness, shakiness, muscle tension).  Restlessness and feelings of lost identity and meaning due to retirement.   Client Treatment Preferences: continue with current therapist   Healthcare consumer's goal for treatment:  Psychologist, Royetta Crochet, Ph.D. will support the patient's ability to achieve the goals identified. Cognitive Behavioral Therapy, Dialectical Behavioral Therapy, Motivational Interviewing, Behavior Activation, parent skills, and other evidenced-based practices will be used to promote progress towards healthy functioning.   Healthcare consumer Deshane Cotroneo will: Actively participate in therapy, working towards healthy functioning.    *Justification for Continuation/Discontinuation of Goal: R=Revised, O=Ongoing, A=Achieved, D=Discontinued  Goal 1) Resolve conflicted feelings, takes steps to change work situation and adapt to the new life circumstances.   5 Point Likert rating baseline date: 02/22/2021 Target Date Goal Was reviewed Status Code Progress towards goal/Likert rating  03/08/2023 03/07/2022          O 2/5 - pt has taken initial steps but at times falls into passivity and has made little progress             Goal 2) Learn and implement coping skills that result in a reduction of anxiety and worry, and improved daily functioning.  5 Point Likert rating baseline date: 02/23/2015 Target Date Goal Was reviewed Status Code Progress towards goal/Likert rating  03/08/2023 03/07/2022         O 3/5 - pt has learned coping skills and uses them appropriately but inconsistently             Goal 3) Increase activities that reinforce a positive self-identity.   5 Point Likert rating baseline date: 02/23/2015 Target Date Goal Was reviewed Status Code Progress towards goal/Likert rating  03/08/2023 03/07/2022         O 2/5 - pt has taken initial  steps to introduce new activities that have improved his feelings of positive self esteem             Goal 4) Learn and implement coping skills that result in a prevention of of relapse of depression and improve  daily functioning.   5  Point Likert rating baseline date: 02/23/2015 Target Date Goal Was reviewed Status Code Progress towards goal/Likert rating  03/08/2023 03/07/2022           O  4/5 - pt has learned coping skills and uses them appropriately but inconsistently  This plan has been reviewed and created by the following participants:  This plan will be reviewed at least every 12 months. Date Behavioral Health Clinician Date Guardian/Patient   03/07/2022  Royetta Crochet, Ph.D.   03/07/2022 Timothy [name]                    Dalan reports that he's stressed at work as usual.  He reported how the management meeting went     He shared, decompressed and I provided the support he needed.        We also d/ an issue that cropped up in his marriage.  We d/ the changing dynamics in their family and the reasons why he is less responsive to his wife.  I gently called him out and noted that his pattern of checking out is a major contributing factor.  I asked him to reflect on the ways he checks out and the cost of doing it frequently.  We d/ ways for him to get checked back in (ie., meditation, exercise, healthy routines).  Luken agreed to speak with his wife about coordinating an evening routine.   Royetta Crochet, PhD

## 2022-09-05 ENCOUNTER — Ambulatory Visit (INDEPENDENT_AMBULATORY_CARE_PROVIDER_SITE_OTHER): Payer: Federal, State, Local not specified - PPO | Admitting: Psychology

## 2022-09-05 DIAGNOSIS — F411 Generalized anxiety disorder: Secondary | ICD-10-CM

## 2022-09-05 NOTE — Progress Notes (Signed)
PROGRESS NOTE:  Name: Timothy [name] Date: 09/05/2022 MRN: [id number] DOB: 07/20/1976 PCP: Aretta Nip, MD  Time spent: 8:00 - 8:55 AM  Annual Review: 03/08/2023   Today I met with  Timothy [name] in remote video (WebEx) face-to-face individual psychotherapy as an accomodation to the COVID-19 Pandemic.  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  Since the start of treatment, depression has resolved with periodic mild reoccurrences.  Anxiety however, has proven to be chronic and effects all areas of his life.  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  We have begun the process of discerning next steps in his career and professional development.  Timothy [name]'s mother is also aging and he is beginning to see the signs of the need for additional support.   Mental Status Exam: Appearance:   Casual and Fairly Groomed     Behavior:  Appropriate  Motor:  Normal  Speech/Language:   NA  Affect:  Appropriate  Mood:  anxious and irritable  Thought process:  normal  Thought content:    WNL  Sensory/Perceptual disturbances:    WNL  Orientation:  oriented to person, place, time/date, and situation  Attention:  Good  Concentration:  Good  Memory:  WNL  Fund of knowledge:   Good  Insight:    Good  Judgment:   Good  Impulse Control:  Good   Risk Assessment: Danger to Self:  No Self-injurious Behavior: No Danger to Others: No Duty to Warn:no Physical Aggression / Violence:No  Access to Firearms a concern: No   Substance Abuse History: Current substance abuse: No     Past Psychiatric History:   Previous psychological history is significant for anxiety and depression Outpatient Providers: current  therapist History of Psych Hospitalization: No  Psychological Testing:  n/a    Abuse History:  Victim of: No.,  n/a    Report needed: No. Victim of Neglect:No. Witness / Exposure to Domestic Violence: No   Protective Services Involvement: No  Witness to [employer] Violence:  No   Family History:  Family History  Problem Relation Age of Onset   Heart attack Neg Hx    Hypertension Father    Stroke Neg Hx    Schizophrenia Father    Diabetes Mother     Living situation: the patient lives with their family  Sexual Orientation: Straight  Relationship Status: married  Name of spouse / other: Timothy Mola "[name]"  If a parent, number of children / ages: Timothy (12) they/them  Support Systems:  Spouse Friends Mother and Teaching laboratory technician Stress:  Yes   Income/Employment/Disability: Employment  Armed forces logistics/support/administrative officer: No   Educational History: Education: Scientist, product/process development: N/a  Any cultural differences that may affect / interfere with treatment:  not applicable   Recreation/Hobbies: D&D, movies  Stressors: Family Conflict, Aging parent  Barriers:  None   Legal History: Pending legal issue / charges: The patient has no significant history of legal issues. History of legal issue / charges:  n/a  Medical History/Surgical History: reviewed Past Medical History:  Diagnosis Date   AKI (acute kidney injury) ([place]) 12/22/2015   Anxiety    Diabetes mellitus ([place])    Essential hypertension    started on lisinopril about 45  days prior to pancreatitis   Obesity    Pancreatitis, acute 12/22/2015   a. 11/2015 - acute severe pancreatitis with shock/sepsis, complicated by acute respiratory failure, transaminitis, anemia of critical illness, acute encephalopathy, AKI (Cr 1.36), protein-calorie malnutrition with severe hypoalbuminemia, & hypokalemia.   Splenic vein thrombosis     Past Surgical History:  Procedure Laterality Date   BACK  SURGERY     ERCP N/A 01/31/2016   Procedure: ENDOSCOPIC RETROGRADE CHOLANGIOPANCREATOGRAPHY (ERCP);  Surgeon: Carol Ada, MD;  Location: [facility] ENDOSCOPY;  Service: Endoscopy;  Laterality: N/A;    Medications: Current Outpatient Medications  Medication Sig Dispense Refill   acetaminophen (TYLENOL) 325 MG tablet Take 650 mg by mouth every 6 (six) hours as needed for fever.     albuterol (PROVENTIL HFA;VENTOLIN HFA) 108 (90 Base) MCG/ACT inhaler Inhale 1-2 puffs into the lungs every 6 (six) hours as needed for wheezing or shortness of breath. 1 Inhaler 0   blood glucose meter kit and supplies Dispense based on patient and insurance preference. Use up to four times daily as directed. (FOR ICD-9 250.00, 250.01). 1 each 0   carvedilol (COREG) 25 MG tablet Take 1 tablet (25 mg total) by mouth 2 (two) times daily. 180 tablet 3   cetirizine (ZYRTEC) 10 MG tablet Take 10 mg by mouth at bedtime.     fluticasone (FLONASE) 50 MCG/ACT nasal spray Place 1 spray into both nostrils at bedtime.     guaiFENesin-codeine 100-10 MG/5ML syrup Take 5 mLs by mouth at bedtime as needed for cough. 120 mL 0   HYDROmorphone (DILAUDID) 1 MG/ML injection Inject 1 mL (1 mg total) into the vein every 4 (four) hours as needed for severe pain. 1 mL 0   imipenem-cilastatin 500 mg in sodium chloride 0.9 % 100 mL Inject 500 mg into the vein every 6 (six) hours.     insulin aspart (NOVOLOG) 100 UNIT/ML FlexPen 8 units with meals (as long as eating 50%) PLUS the following scale: CBG 70 - 120: 0 units CBG 121 - 150: 2 units CBG 151 - 200: 3 units CBG 201 - 250: 5 units CBG 251 - 300: 8 units CBG 301 - 350: 11 units CBG 351 - 400: 15 units (Patient taking differently: Inject 8 Units into the skin 3 (three) times daily with meals. 8 units with meals (as long as eating 50%) PLUS the following scale: CBG 70 - 120: 0 units CBG 121 - 150: 2 units CBG 151 - 200: 3 units CBG 201 - 250: 5 units CBG 251 - 300: 8 units CBG 301 - 350: 11  units CBG 351 - 400: 15 units) 60 mL 11   Insulin Glargine (LANTUS SOLOSTAR) 100 UNIT/ML Solostar Pen Inject 25 Units into the skin daily at 10 pm. 15 mL 11   Insulin Pen Needle 31G X 5 MM MISC For insulin injections 100 each 1   pantoprazole (PROTONIX) 40 MG tablet Take 1 tablet (40 mg total) by mouth at bedtime. 30 tablet 0   No current facility-administered medications for this visit.    No Known Allergies  Diagnoses:  Generalized Anxiety Disorder Phase of Life Change - adult   Individualized Treatment Plan                Strengths: verbal, bright, friendly  Supports: wife, daughter, parents   Goal/Needs for Treatment:  In order of importance to patient 1)  Resolve conflicted feelings, takes steps to change work situation and adapt to the new life  circumstances.   2) Learn and implement coping skills that result in a reduction of anxiety and worry, and improved daily functioning.  3) Increase activities that reinforce a positive self-identity.  4) Learn and implement coping skills that result in a prevention of of relapse of depression and improve daily functioning.    Client Statement of Needs: work on pervasive issues with anxiety, assistance in making a big life change as he contemplates retiring from current position and figures out new direction   Treatment Level: Individual Biweekly Outpatient Psychotherapy  Symptoms: Autonomic hyperactivity (e.g., palpitations, shortness of breath, dry mouth, trouble swallowing, nausea, diarrhea).  Periodic periods of depressed or irritable mood.  Excessive and/or unrealistic worry that is difficult to control occurring more days than not for at least 6 months about a number of events or activities.  Feelings of hopelessness, worthlessness, or inappropriate guilt.  Hypervigilance (e.g., feeling constantly on edge, experiencing concentration difficulties, having trouble falling or staying asleep, exhibiting a general state of  irritability).  Low self-esteem.  Behavioral passivity, difficulties initiating action Motor tension (e.g., restlessness, tiredness, shakiness, muscle tension).  Restlessness and feelings of lost identity and meaning due to retirement.   Client Treatment Preferences: continue with current therapist   Healthcare consumer's goal for treatment:  Psychologist, Royetta Crochet, Ph.D. will support the patient's ability to achieve the goals identified. Cognitive Behavioral Therapy, Dialectical Behavioral Therapy, Motivational Interviewing, Behavior Activation, parent skills, and other evidenced-based practices will be used to promote progress towards healthy functioning.   Healthcare consumer Deshane Cotroneo will: Actively participate in therapy, working towards healthy functioning.    *Justification for Continuation/Discontinuation of Goal: R=Revised, O=Ongoing, A=Achieved, D=Discontinued  Goal 1) Resolve conflicted feelings, takes steps to change work situation and adapt to the new life circumstances.   5 Point Likert rating baseline date: 02/22/2021 Target Date Goal Was reviewed Status Code Progress towards goal/Likert rating  03/08/2023 03/07/2022          O 2/5 - pt has taken initial steps but at times falls into passivity and has made little progress             Goal 2) Learn and implement coping skills that result in a reduction of anxiety and worry, and improved daily functioning.  5 Point Likert rating baseline date: 02/23/2015 Target Date Goal Was reviewed Status Code Progress towards goal/Likert rating  03/08/2023 03/07/2022         O 3/5 - pt has learned coping skills and uses them appropriately but inconsistently             Goal 3) Increase activities that reinforce a positive self-identity.   5 Point Likert rating baseline date: 02/23/2015 Target Date Goal Was reviewed Status Code Progress towards goal/Likert rating  03/08/2023 03/07/2022         O 2/5 - pt has taken initial  steps to introduce new activities that have improved his feelings of positive self esteem             Goal 4) Learn and implement coping skills that result in a prevention of of relapse of depression and improve  daily functioning.   5  Point Likert rating baseline date: 02/23/2015 Target Date Goal Was reviewed Status Code Progress towards goal/Likert rating  03/08/2023 03/07/2022           O  4/5 - pt has learned coping skills and uses them appropriately but inconsistently  This plan has been reviewed and created by the following participants:  This plan will be reviewed at least every 12 months. Date Behavioral Health Clinician Date Guardian/Patient   03/07/2022  Royetta Crochet, Ph.D.   03/07/2022 Timothy [name]                    Dontario reports that work stress has been high.  He d/p action steps that were created as a result of the office investigation.  We also d/e/p trainings he needed to create, present and how he attempts to manage his performance anxiety.     Royetta Crochet, PhD

## 2022-09-19 ENCOUNTER — Ambulatory Visit (INDEPENDENT_AMBULATORY_CARE_PROVIDER_SITE_OTHER): Payer: Federal, State, Local not specified - PPO | Admitting: Psychology

## 2022-09-19 DIAGNOSIS — F411 Generalized anxiety disorder: Secondary | ICD-10-CM | POA: Diagnosis not present

## 2022-09-19 NOTE — Progress Notes (Signed)
PROGRESS NOTE:  Name: Timothy [name] Date: 09/19/2022 MRN: [id number] DOB: [birth year] PCP: Aretta Nip, MD  Time spent: 8:00 - 8:55 AM  Annual Review: 03/08/2023   Today I met with  Timothy [name] in remote video (WebEx) face-to-face individual psychotherapy as an accomodation to the COVID-19 Pandemic.  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  Since the start of treatment, depression has resolved with periodic mild reoccurrences.  Anxiety however, has proven to be chronic and effects all areas of his life.  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  We have begun the process of discerning next steps in his career and professional development.  Timothy [name]'s mother is also aging and he is beginning to see the signs of the need for additional support.   Mental Status Exam: Appearance:   Casual and Fairly Groomed     Behavior:  Appropriate  Motor:  Normal  Speech/Language:   NA  Affect:  Appropriate  Mood:  anxious and irritable  Thought process:  normal  Thought content:    WNL  Sensory/Perceptual disturbances:    WNL  Orientation:  oriented to person, place, time/date, and situation  Attention:  Good  Concentration:  Good  Memory:  WNL  Fund of knowledge:   Good  Insight:    Good  Judgment:   Good  Impulse Control:  Good   Risk Assessment: Danger to Self:  No Self-injurious Behavior: No Danger to Others: No Duty to Warn:no Physical Aggression / Violence:No  Access to Firearms a concern: No   Substance Abuse History: Current substance abuse: No     Past Psychiatric History:   Previous psychological history is significant for anxiety and depression Outpatient Providers: current  therapist History of Psych Hospitalization: No  Psychological Testing:  n/a    Abuse History:  Victim of: No.,  n/a    Report needed: No. Victim of Neglect:No. Witness / Exposure to Domestic Violence: No   Protective Services Involvement: No  Witness to [employer] Violence:  No   Family History:  Family History  Problem Relation Age of Onset   Heart attack Neg Hx    Hypertension Father    Stroke Neg Hx    Schizophrenia Father    Diabetes Mother     Living situation: the patient lives with their family  Sexual Orientation: Straight  Relationship Status: married  Name of spouse / other: Timothy [name] "[name]"  If a parent, number of children / ages: Timothy [name] (12) they/them  Support Systems:  Spouse Friends Mother and Teaching laboratory technician Stress:  Yes   Income/Employment/Disability: Employment  [employer] logistics/support/administrative officer: No   Educational History: Education: Scientist, product/process development: N/a  Any cultural differences that may affect / interfere with treatment:  not applicable   Recreation/Hobbies: D&D, movies  Stressors: Family Conflict, Aging parent  Barriers:  None   Legal History: Pending legal issue / charges: The patient has no significant history of legal issues. History of legal issue / charges:  n/a  Medical History/Surgical History: reviewed Past Medical History:  Diagnosis Date   AKI (acute kidney injury) ([place]) 12/22/2015   Anxiety    Diabetes mellitus ([place])    Essential hypertension    started on lisinopril about  45 days prior to pancreatitis   Obesity    Pancreatitis, acute 12/22/2015   a. 11/2015 - acute severe pancreatitis with shock/sepsis, complicated by acute respiratory failure, transaminitis, anemia of critical illness, acute encephalopathy, AKI (Cr 1.36), protein-calorie malnutrition with severe hypoalbuminemia, & hypokalemia.   Splenic vein thrombosis     Past Surgical History:  Procedure Laterality Date   BACK  SURGERY     ERCP N/A 01/31/2016   Procedure: ENDOSCOPIC RETROGRADE CHOLANGIOPANCREATOGRAPHY (ERCP);  Surgeon: Carol Ada, MD;  Location: [facility] ENDOSCOPY;  Service: Endoscopy;  Laterality: N/A;    Medications: Current Outpatient Medications  Medication Sig Dispense Refill   acetaminophen (TYLENOL) 325 MG tablet Take 650 mg by mouth every 6 (six) hours as needed for fever.     albuterol (PROVENTIL HFA;VENTOLIN HFA) 108 (90 Base) MCG/ACT inhaler Inhale 1-2 puffs into the lungs every 6 (six) hours as needed for wheezing or shortness of breath. 1 Inhaler 0   blood glucose meter kit and supplies Dispense based on patient and insurance preference. Use up to four times daily as directed. (FOR ICD-9 250.00, 250.01). 1 each 0   carvedilol (COREG) 25 MG tablet Take 1 tablet (25 mg total) by mouth 2 (two) times daily. 180 tablet 3   cetirizine (ZYRTEC) 10 MG tablet Take 10 mg by mouth at bedtime.     fluticasone (FLONASE) 50 MCG/ACT nasal spray Place 1 spray into both nostrils at bedtime.     guaiFENesin-codeine 100-10 MG/5ML syrup Take 5 mLs by mouth at bedtime as needed for cough. 120 mL 0   HYDROmorphone (DILAUDID) 1 MG/ML injection Inject 1 mL (1 mg total) into the vein every 4 (four) hours as needed for severe pain. 1 mL 0   imipenem-cilastatin 500 mg in sodium chloride 0.9 % 100 mL Inject 500 mg into the vein every 6 (six) hours.     insulin aspart (NOVOLOG) 100 UNIT/ML FlexPen 8 units with meals (as long as eating 50%) PLUS the following scale: CBG 70 - 120: 0 units CBG 121 - 150: 2 units CBG 151 - 200: 3 units CBG 201 - 250: 5 units CBG 251 - 300: 8 units CBG 301 - 350: 11 units CBG 351 - 400: 15 units (Patient taking differently: Inject 8 Units into the skin 3 (three) times daily with meals. 8 units with meals (as long as eating 50%) PLUS the following scale: CBG 70 - 120: 0 units CBG 121 - 150: 2 units CBG 151 - 200: 3 units CBG 201 - 250: 5 units CBG 251 - 300: 8 units CBG 301 - 350: 11  units CBG 351 - 400: 15 units) 60 mL 11   Insulin Glargine (LANTUS SOLOSTAR) 100 UNIT/ML Solostar Pen Inject 25 Units into the skin daily at 10 pm. 15 mL 11   Insulin Pen Needle 31G X 5 MM MISC For insulin injections 100 each 1   pantoprazole (PROTONIX) 40 MG tablet Take 1 tablet (40 mg total) by mouth at bedtime. 30 tablet 0   No current facility-administered medications for this visit.    No Known Allergies  Diagnoses:  Generalized Anxiety Disorder Phase of Life Change - adult   Individualized Treatment Plan                Strengths: verbal, bright, friendly  Supports: wife, daughter, parents   Goal/Needs for Treatment:  In order of importance to patient 1)  Resolve conflicted feelings, takes steps to change work situation and adapt to the new  life circumstances.   2) Learn and implement coping skills that result in a reduction of anxiety and worry, and improved daily functioning.  3) Increase activities that reinforce a positive self-identity.  4) Learn and implement coping skills that result in a prevention of of relapse of depression and improve daily functioning.    Client Statement of Needs: work on pervasive issues with anxiety, assistance in making a big life change as he contemplates retiring from current position and figures out new direction   Treatment Level: Individual Biweekly Outpatient Psychotherapy  Symptoms: Autonomic hyperactivity (e.g., palpitations, shortness of breath, dry mouth, trouble swallowing, nausea, diarrhea).  Periodic periods of depressed or irritable mood.  Excessive and/or unrealistic worry that is difficult to control occurring more days than not for at least 6 months about a number of events or activities.  Feelings of hopelessness, worthlessness, or inappropriate guilt.  Hypervigilance (e.g., feeling constantly on edge, experiencing concentration difficulties, having trouble falling or staying asleep, exhibiting a general state of  irritability).  Low self-esteem.  Behavioral passivity, difficulties initiating action Motor tension (e.g., restlessness, tiredness, shakiness, muscle tension).  Restlessness and feelings of lost identity and meaning due to retirement.   Client Treatment Preferences: continue with current therapist   Healthcare consumer's goal for treatment:  Psychologist, Royetta Crochet, Ph.D. will support the patient's ability to achieve the goals identified. Cognitive Behavioral Therapy, Dialectical Behavioral Therapy, Motivational Interviewing, Behavior Activation, parent skills, and other evidenced-based practices will be used to promote progress towards healthy functioning.   Healthcare consumer Timothy [name] will: Actively participate in therapy, working towards healthy functioning.    *Justification for Continuation/Discontinuation of Goal: R=Revised, O=Ongoing, A=Achieved, D=Discontinued  Goal 1) Resolve conflicted feelings, takes steps to change work situation and adapt to the new life circumstances.   5 Point Likert rating baseline date: 02/22/2021 Target Date Goal Was reviewed Status Code Progress towards goal/Likert rating  03/08/2023 03/07/2022          O 2/5 - pt has taken initial steps but at times falls into passivity and has made little progress             Goal 2) Learn and implement coping skills that result in a reduction of anxiety and worry, and improved daily functioning.  5 Point Likert rating baseline date: 02/23/2015 Target Date Goal Was reviewed Status Code Progress towards goal/Likert rating  03/08/2023 03/07/2022         O 3/5 - pt has learned coping skills and uses them appropriately but inconsistently             Goal 3) Increase activities that reinforce a positive self-identity.   5 Point Likert rating baseline date: 02/23/2015 Target Date Goal Was reviewed Status Code Progress towards goal/Likert rating  03/08/2023 03/07/2022         O 2/5 - pt has taken initial  steps to introduce new activities that have improved his feelings of positive self esteem             Goal 4) Learn and implement coping skills that result in a prevention of of relapse of depression and improve  daily functioning.   5  Point Likert rating baseline date: 02/23/2015 Target Date Goal Was reviewed Status Code Progress towards goal/Likert rating  03/08/2023 03/07/2022           O  4/5 - pt has learned coping skills and uses them appropriately but inconsistently  This plan has been reviewed and created by the following participants:  This plan will be reviewed at least every 12 months. Date Behavioral Health Clinician Date Guardian/Patient   03/07/2022  Royetta Crochet, Ph.D.   03/07/2022 Timothy [name]                    Ruari reports that work stress continues to be high.  He states he has put d/e of requesting an early out from work on the back burner because it's been a stressful time for his wife.  I noted that Asadbek was submitting week after week to a very unpleasant situation and that he would feel better if he took more control of the situation.  I d/e/p that he needed to step up to the plate on a couple of issues as a way to better manage his anxiety.       Royetta Crochet, PhD

## 2022-10-03 ENCOUNTER — Ambulatory Visit: Payer: Federal, State, Local not specified - PPO | Admitting: Psychology

## 2022-10-03 DIAGNOSIS — F411 Generalized anxiety disorder: Secondary | ICD-10-CM

## 2022-10-03 NOTE — Progress Notes (Signed)
PROGRESS NOTE:  Name: Timothy [name] Date: 10/03/2022 MRN: [id number] DOB: [birth year] PCP: Aretta Nip, MD  Time spent: 8:00 - 8:55 AM  Annual Review: 03/08/2023   Today I met with  [name] in remote video (WebEx) face-to-face individual psychotherapy as an accomodation to the COVID-19 Pandemic.  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  Since the start of treatment, depression has resolved with periodic mild reoccurrences.  Anxiety however, has proven to be chronic and effects all areas of his life.  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  We have begun the process of discerning next steps in his career and professional development.  [name]'s mother is also aging and he is beginning to see the signs of the need for additional support.   Mental Status Exam: Appearance:   Casual and Fairly Groomed     Behavior:  Appropriate  Motor:  Normal  Speech/Language:   NA  Affect:  Appropriate  Mood:  anxious and irritable  Thought process:  normal  Thought content:    WNL  Sensory/Perceptual disturbances:    WNL  Orientation:  oriented to person, place, time/date, and situation  Attention:  Good  Concentration:  Good  Memory:  WNL  Fund of knowledge:   Good  Insight:    Good  Judgment:   Good  Impulse Control:  Good   Risk Assessment: Danger to Self:  No Self-injurious Behavior: No Danger to Others: No Duty to Warn:no Physical Aggression / Violence:No  Access to Firearms a concern: No   Substance Abuse History: Current substance abuse: No     Past Psychiatric History:   Previous psychological history is significant for anxiety and depression Outpatient Providers: current  therapist History of Psych Hospitalization: No  Psychological Testing:  n/a    Abuse History:  Victim of: No.,  n/a    Report needed: No. Victim of Neglect:No. Witness / Exposure to Domestic Violence: No   Protective Services Involvement: No  Witness to [employer] Violence:  No   Family History:  Family History  Problem Relation Age of Onset   Heart attack Neg Hx    Hypertension Father    Stroke Neg Hx    Schizophrenia Father    Diabetes Mother     Living situation: the patient lives with their family  Sexual Orientation: Straight  Relationship Status: married  Name of spouse / other: [name] "[name]"  If a parent, number of children / ages: [name] (12) they/them  Support Systems:  Spouse Friends Mother and Teaching laboratory technician Stress:  Yes   Income/Employment/Disability: Employment  Armed forces logistics/support/administrative officer: No   Educational History: Education: Scientist, product/process development: N/a  Any cultural differences that may affect / interfere with treatment:  not applicable   Recreation/Hobbies: D&D, movies  Stressors: Family Conflict, Aging parent  Barriers:  None   Legal History: Pending legal issue / charges: The patient has no significant history of legal issues. History of legal issue / charges:  n/a  Medical History/Surgical History: reviewed Past Medical History:  Diagnosis Date   AKI (acute kidney injury) ([place]) 12/22/2015   Anxiety    Diabetes mellitus ([place])    Essential hypertension    started on lisinopril about 45  days prior to pancreatitis   Obesity    Pancreatitis, acute 12/22/2015   a. 11/2015 - acute severe pancreatitis with shock/sepsis, complicated by acute respiratory failure, transaminitis, anemia of critical illness, acute encephalopathy, AKI (Cr 1.36), protein-calorie malnutrition with severe hypoalbuminemia, & hypokalemia.   Splenic vein thrombosis     Past Surgical History:  Procedure Laterality Date   BACK  SURGERY     ERCP N/A 01/31/2016   Procedure: ENDOSCOPIC RETROGRADE CHOLANGIOPANCREATOGRAPHY (ERCP);  Surgeon: Carol Ada, MD;  Location: [facility] ENDOSCOPY;  Service: Endoscopy;  Laterality: N/A;    Medications: Current Outpatient Medications  Medication Sig Dispense Refill   acetaminophen (TYLENOL) 325 MG tablet Take 650 mg by mouth every 6 (six) hours as needed for fever.     albuterol (PROVENTIL HFA;VENTOLIN HFA) 108 (90 Base) MCG/ACT inhaler Inhale 1-2 puffs into the lungs every 6 (six) hours as needed for wheezing or shortness of breath. 1 Inhaler 0   blood glucose meter kit and supplies Dispense based on patient and insurance preference. Use up to four times daily as directed. (FOR ICD-9 250.00, 250.01). 1 each 0   carvedilol (COREG) 25 MG tablet Take 1 tablet (25 mg total) by mouth 2 (two) times daily. 180 tablet 3   cetirizine (ZYRTEC) 10 MG tablet Take 10 mg by mouth at bedtime.     fluticasone (FLONASE) 50 MCG/ACT nasal spray Place 1 spray into both nostrils at bedtime.     guaiFENesin-codeine 100-10 MG/5ML syrup Take 5 mLs by mouth at bedtime as needed for cough. 120 mL 0   HYDROmorphone (DILAUDID) 1 MG/ML injection Inject 1 mL (1 mg total) into the vein every 4 (four) hours as needed for severe pain. 1 mL 0   imipenem-cilastatin 500 mg in sodium chloride 0.9 % 100 mL Inject 500 mg into the vein every 6 (six) hours.     insulin aspart (NOVOLOG) 100 UNIT/ML FlexPen 8 units with meals (as long as eating 50%) PLUS the following scale: CBG 70 - 120: 0 units CBG 121 - 150: 2 units CBG 151 - 200: 3 units CBG 201 - 250: 5 units CBG 251 - 300: 8 units CBG 301 - 350: 11 units CBG 351 - 400: 15 units (Patient taking differently: Inject 8 Units into the skin 3 (three) times daily with meals. 8 units with meals (as long as eating 50%) PLUS the following scale: CBG 70 - 120: 0 units CBG 121 - 150: 2 units CBG 151 - 200: 3 units CBG 201 - 250: 5 units CBG 251 - 300: 8 units CBG 301 - 350: 11  units CBG 351 - 400: 15 units) 60 mL 11   Insulin Glargine (LANTUS SOLOSTAR) 100 UNIT/ML Solostar Pen Inject 25 Units into the skin daily at 10 pm. 15 mL 11   Insulin Pen Needle 31G X 5 MM MISC For insulin injections 100 each 1   pantoprazole (PROTONIX) 40 MG tablet Take 1 tablet (40 mg total) by mouth at bedtime. 30 tablet 0   No current facility-administered medications for this visit.    No Known Allergies  Diagnoses:  Generalized Anxiety Disorder Phase of Life Change - adult   Individualized Treatment Plan                Strengths: verbal, bright, friendly  Supports: wife, daughter, parents   Goal/Needs for Treatment:  In order of importance to patient 1)  Resolve conflicted feelings, takes steps to change work situation and adapt to the new life  circumstances.   2) Learn and implement coping skills that result in a reduction of anxiety and worry, and improved daily functioning.  3) Increase activities that reinforce a positive self-identity.  4) Learn and implement coping skills that result in a prevention of of relapse of depression and improve daily functioning.    Client Statement of Needs: work on pervasive issues with anxiety, assistance in making a big life change as he contemplates retiring from current position and figures out new direction   Treatment Level: Individual Biweekly Outpatient Psychotherapy  Symptoms: Autonomic hyperactivity (e.g., palpitations, shortness of breath, dry mouth, trouble swallowing, nausea, diarrhea).  Periodic periods of depressed or irritable mood.  Excessive and/or unrealistic worry that is difficult to control occurring more days than not for at least 6 months about a number of events or activities.  Feelings of hopelessness, worthlessness, or inappropriate guilt.  Hypervigilance (e.g., feeling constantly on edge, experiencing concentration difficulties, having trouble falling or staying asleep, exhibiting a general state of  irritability).  Low self-esteem.  Behavioral passivity, difficulties initiating action Motor tension (e.g., restlessness, tiredness, shakiness, muscle tension).  Restlessness and feelings of lost identity and meaning due to retirement.   Client Treatment Preferences: continue with current therapist   Healthcare consumer's goal for treatment:  Psychologist, Royetta Crochet, Ph.D. will support the patient's ability to achieve the goals identified. Cognitive Behavioral Therapy, Dialectical Behavioral Therapy, Motivational Interviewing, Behavior Activation, parent skills, and other evidenced-based practices will be used to promote progress towards healthy functioning.   Healthcare consumer Deshane Cotroneo will: Actively participate in therapy, working towards healthy functioning.    *Justification for Continuation/Discontinuation of Goal: R=Revised, O=Ongoing, A=Achieved, D=Discontinued  Goal 1) Resolve conflicted feelings, takes steps to change work situation and adapt to the new life circumstances.   5 Point Likert rating baseline date: 02/22/2021 Target Date Goal Was reviewed Status Code Progress towards goal/Likert rating  03/08/2023 03/07/2022          O 2/5 - pt has taken initial steps but at times falls into passivity and has made little progress             Goal 2) Learn and implement coping skills that result in a reduction of anxiety and worry, and improved daily functioning.  5 Point Likert rating baseline date: 02/23/2015 Target Date Goal Was reviewed Status Code Progress towards goal/Likert rating  03/08/2023 03/07/2022         O 3/5 - pt has learned coping skills and uses them appropriately but inconsistently             Goal 3) Increase activities that reinforce a positive self-identity.   5 Point Likert rating baseline date: 02/23/2015 Target Date Goal Was reviewed Status Code Progress towards goal/Likert rating  03/08/2023 03/07/2022         O 2/5 - pt has taken initial  steps to introduce new activities that have improved his feelings of positive self esteem             Goal 4) Learn and implement coping skills that result in a prevention of of relapse of depression and improve  daily functioning.   5  Point Likert rating baseline date: 02/23/2015 Target Date Goal Was reviewed Status Code Progress towards goal/Likert rating  03/08/2023 03/07/2022           O  4/5 - pt has learned coping skills and uses them appropriately but inconsistently  This plan has been reviewed and created by the following participants:  This plan will be reviewed at least every 12 months. Date Behavioral Health Clinician Date Guardian/Patient   03/07/2022  Royetta Crochet, Ph.D.   03/07/2022 [name]                    Maksim reports that it's been tense in the house because [name] is very anxious.  We d/e/p what was happening in [name]'s life that was creating stress, how to provide support and minimize anxiety.  Emeal also spoke about a situation that occurred with his father-in-law and another at work.  I noted that there were similarities in these situations.  I noted where he demonstrated growth.  We then d/e/p each situation, identified he patterns of (in)action and what was needed from him to move forward.     Royetta Crochet, PhD

## 2022-10-17 ENCOUNTER — Ambulatory Visit: Payer: Federal, State, Local not specified - PPO | Admitting: Psychology

## 2022-10-17 DIAGNOSIS — F411 Generalized anxiety disorder: Secondary | ICD-10-CM | POA: Diagnosis not present

## 2022-10-17 DIAGNOSIS — Z600 Problems of adjustment to life-cycle transitions: Secondary | ICD-10-CM | POA: Diagnosis not present

## 2022-10-17 NOTE — Progress Notes (Signed)
PROGRESS NOTE:  Name: Timothy [name] Date: 10/17/2022 MRN: [id number] DOB: [birth year] PCP: Aretta Nip, MD  Time spent: 8:00 - 8:55 AM  Annual Review: 03/08/2023   Today I met with  Timothy [name] in remote video (WebEx) face-to-face individual psychotherapy as an accomodation to the COVID-19 Pandemic.  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  Since the start of treatment, depression has resolved with periodic mild reoccurrences.  Anxiety however, has proven to be chronic and effects all areas of his life.  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  We have begun the process of discerning next steps in his career and professional development.  Timothy [name]'s mother is also aging and he is beginning to see the signs of the need for additional support.   Mental Status Exam: Appearance:   Casual and Fairly Groomed     Behavior:  Appropriate  Motor:  Normal  Speech/Language:   NA  Affect:  Appropriate  Mood:  anxious and irritable  Thought process:  normal  Thought content:    WNL  Sensory/Perceptual disturbances:    WNL  Orientation:  oriented to person, place, time/date, and situation  Attention:  Good  Concentration:  Good  Memory:  WNL  Fund of knowledge:   Good  Insight:    Good  Judgment:   Good  Impulse Control:  Good   Risk Assessment: Danger to Self:  No Self-injurious Behavior: No Danger to Others: No Duty to Warn:no Physical Aggression / Violence:No  Access to Firearms a concern: No   Substance Abuse History: Current substance abuse: No     Past Psychiatric History:   Previous psychological history is significant for anxiety and depression Outpatient Providers: current  therapist History of Psych Hospitalization: No  Psychological Testing:  n/a    Abuse History:  Victim of: No.,  n/a    Report needed: No. Victim of Neglect:No. Witness / Exposure to Domestic Violence: No   Protective Services Involvement: No  Witness to [employer] Violence:  No   Family History:  Family History  Problem Relation Age of Onset   Heart attack Neg Hx    Hypertension Father    Stroke Neg Hx    Schizophrenia Father    Diabetes Mother     Living situation: the patient lives with their family  Sexual Orientation: Straight  Relationship Status: married  Name of spouse / other: Timothy Mola "[name]"  If a parent, number of children / ages: [name] (12) they/them  Support Systems:  Spouse Friends Mother and Teaching laboratory technician Stress:  Yes   Income/Employment/Disability: Employment  [employer] forces logistics/support/administrative officer: No   Educational History: Education: Scientist, product/process development: N/a  Any cultural differences that may affect / interfere with treatment:  not applicable   Recreation/Hobbies: D&D, movies  Stressors: Family Conflict, Aging parent  Barriers:  None   Legal History: Pending legal issue / charges: The patient has no significant history of legal issues. History of legal issue / charges:  n/a  Medical History/Surgical History: reviewed Past Medical History:  Diagnosis Date   AKI (acute kidney injury) ([place]) 12/22/2015   Anxiety    Diabetes mellitus ([place])    Essential hypertension    started on lisinopril about 45  days prior to pancreatitis   Obesity    Pancreatitis, acute 12/22/2015   a. 11/2015 - acute severe pancreatitis with shock/sepsis, complicated by acute respiratory failure, transaminitis, anemia of critical illness, acute encephalopathy, AKI (Cr 1.36), protein-calorie malnutrition with severe hypoalbuminemia, & hypokalemia.   Splenic vein thrombosis     Past Surgical History:  Procedure Laterality Date   BACK  SURGERY     ERCP N/A 01/31/2016   Procedure: ENDOSCOPIC RETROGRADE CHOLANGIOPANCREATOGRAPHY (ERCP);  Surgeon: Carol Ada, MD;  Location: [facility] ENDOSCOPY;  Service: Endoscopy;  Laterality: N/A;    Medications: Current Outpatient Medications  Medication Sig Dispense Refill   acetaminophen (TYLENOL) 325 MG tablet Take 650 mg by mouth every 6 (six) hours as needed for fever.     albuterol (PROVENTIL HFA;VENTOLIN HFA) 108 (90 Base) MCG/ACT inhaler Inhale 1-2 puffs into the lungs every 6 (six) hours as needed for wheezing or shortness of breath. 1 Inhaler 0   blood glucose meter kit and supplies Dispense based on patient and insurance preference. Use up to four times daily as directed. (FOR ICD-9 250.00, 250.01). 1 each 0   carvedilol (COREG) 25 MG tablet Take 1 tablet (25 mg total) by mouth 2 (two) times daily. 180 tablet 3   cetirizine (ZYRTEC) 10 MG tablet Take 10 mg by mouth at bedtime.     fluticasone (FLONASE) 50 MCG/ACT nasal spray Place 1 spray into both nostrils at bedtime.     guaiFENesin-codeine 100-10 MG/5ML syrup Take 5 mLs by mouth at bedtime as needed for cough. 120 mL 0   HYDROmorphone (DILAUDID) 1 MG/ML injection Inject 1 mL (1 mg total) into the vein every 4 (four) hours as needed for severe pain. 1 mL 0   imipenem-cilastatin 500 mg in sodium chloride 0.9 % 100 mL Inject 500 mg into the vein every 6 (six) hours.     insulin aspart (NOVOLOG) 100 UNIT/ML FlexPen 8 units with meals (as long as eating 50%) PLUS the following scale: CBG 70 - 120: 0 units CBG 121 - 150: 2 units CBG 151 - 200: 3 units CBG 201 - 250: 5 units CBG 251 - 300: 8 units CBG 301 - 350: 11 units CBG 351 - 400: 15 units (Patient taking differently: Inject 8 Units into the skin 3 (three) times daily with meals. 8 units with meals (as long as eating 50%) PLUS the following scale: CBG 70 - 120: 0 units CBG 121 - 150: 2 units CBG 151 - 200: 3 units CBG 201 - 250: 5 units CBG 251 - 300: 8 units CBG 301 - 350: 11  units CBG 351 - 400: 15 units) 60 mL 11   Insulin Glargine (LANTUS SOLOSTAR) 100 UNIT/ML Solostar Pen Inject 25 Units into the skin daily at 10 pm. 15 mL 11   Insulin Pen Needle 31G X 5 MM MISC For insulin injections 100 each 1   pantoprazole (PROTONIX) 40 MG tablet Take 1 tablet (40 mg total) by mouth at bedtime. 30 tablet 0   No current facility-administered medications for this visit.    No Known Allergies  Diagnoses:  Generalized Anxiety Disorder Phase of Life Change - adult   Individualized Treatment Plan                Strengths: verbal, bright, friendly  Supports: wife, daughter, parents   Goal/Needs for Treatment:  In order of importance to patient 1)  Resolve conflicted feelings, takes steps to change work situation and adapt to the new life  circumstances.   2) Learn and implement coping skills that result in a reduction of anxiety and worry, and improved daily functioning.  3) Increase activities that reinforce a positive self-identity.  4) Learn and implement coping skills that result in a prevention of of relapse of depression and improve daily functioning.    Client Statement of Needs: work on pervasive issues with anxiety, assistance in making a big life change as he contemplates retiring from current position and figures out new direction   Treatment Level: Individual Biweekly Outpatient Psychotherapy  Symptoms: Autonomic hyperactivity (e.g., palpitations, shortness of breath, dry mouth, trouble swallowing, nausea, diarrhea).  Periodic periods of depressed or irritable mood.  Excessive and/or unrealistic worry that is difficult to control occurring more days than not for at least 6 months about a number of events or activities.  Feelings of hopelessness, worthlessness, or inappropriate guilt.  Hypervigilance (e.g., feeling constantly on edge, experiencing concentration difficulties, having trouble falling or staying asleep, exhibiting a general state of  irritability).  Low self-esteem.  Behavioral passivity, difficulties initiating action Motor tension (e.g., restlessness, tiredness, shakiness, muscle tension).  Restlessness and feelings of lost identity and meaning due to retirement.   Client Treatment Preferences: continue with current therapist   Healthcare consumer's goal for treatment:  Psychologist, Royetta Crochet, Ph.D. will support the patient's ability to achieve the goals identified. Cognitive Behavioral Therapy, Dialectical Behavioral Therapy, Motivational Interviewing, Behavior Activation, parent skills, and other evidenced-based practices will be used to promote progress towards healthy functioning.   Healthcare consumer Deshane Cotroneo will: Actively participate in therapy, working towards healthy functioning.    *Justification for Continuation/Discontinuation of Goal: R=Revised, O=Ongoing, A=Achieved, D=Discontinued  Goal 1) Resolve conflicted feelings, takes steps to change work situation and adapt to the new life circumstances.   5 Point Likert rating baseline date: 02/22/2021 Target Date Goal Was reviewed Status Code Progress towards goal/Likert rating  03/08/2023 03/07/2022          O 2/5 - pt has taken initial steps but at times falls into passivity and has made little progress             Goal 2) Learn and implement coping skills that result in a reduction of anxiety and worry, and improved daily functioning.  5 Point Likert rating baseline date: 02/23/2015 Target Date Goal Was reviewed Status Code Progress towards goal/Likert rating  03/08/2023 03/07/2022         O 3/5 - pt has learned coping skills and uses them appropriately but inconsistently             Goal 3) Increase activities that reinforce a positive self-identity.   5 Point Likert rating baseline date: 02/23/2015 Target Date Goal Was reviewed Status Code Progress towards goal/Likert rating  03/08/2023 03/07/2022         O 2/5 - pt has taken initial  steps to introduce new activities that have improved his feelings of positive self esteem             Goal 4) Learn and implement coping skills that result in a prevention of of relapse of depression and improve  daily functioning.   5  Point Likert rating baseline date: 02/23/2015 Target Date Goal Was reviewed Status Code Progress towards goal/Likert rating  03/08/2023 03/07/2022           O  4/5 - pt has learned coping skills and uses them appropriately but inconsistently  This plan has been reviewed and created by the following participants:  This plan will be reviewed at least every 12 months. Date Behavioral Health Clinician Date Guardian/Patient   03/07/2022  Royetta Crochet, Ph.D.   03/07/2022 Timothy [name]                    Yeriel reports that it was a tough week at home.  He shared a number of situations in which both his wife and daughter were under a great deal of stress and each separately became emotionally dysregulated.  We d/e/p what occurred, how he responded and how he can provide support and guidance when needed.    Royetta Crochet, PhD

## 2022-10-31 ENCOUNTER — Ambulatory Visit: Payer: Federal, State, Local not specified - PPO | Admitting: Psychology

## 2022-11-14 ENCOUNTER — Ambulatory Visit: Payer: Federal, State, Local not specified - PPO | Admitting: Psychology

## 2022-11-28 ENCOUNTER — Ambulatory Visit: Payer: Federal, State, Local not specified - PPO | Admitting: Psychology

## 2022-12-12 ENCOUNTER — Ambulatory Visit (INDEPENDENT_AMBULATORY_CARE_PROVIDER_SITE_OTHER): Payer: Federal, State, Local not specified - PPO | Admitting: Psychology

## 2022-12-12 DIAGNOSIS — F411 Generalized anxiety disorder: Secondary | ICD-10-CM

## 2022-12-12 NOTE — Progress Notes (Signed)
PROGRESS NOTE:  Name: Timothy [name] Date: 12/12/2022 MRN: [id number] DOB: Nov 29, 1975 PCP: Clayborn Heron, MD  Time spent: 8:00 - 8:55 AM  Annual Review: 03/08/2023   Today I met with  Timothy [name] in remote video (WebEx) face-to-face individual psychotherapy as an accomodation to the COVID-19 Pandemic.  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  Since the start of treatment, depression has resolved with periodic mild reoccurrences.  Anxiety however, has proven to be chronic and effects all areas of his life.  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  We have begun the process of discerning next steps in his career and professional development.  Timothy [name]'s mother is also aging and he is beginning to see the signs of the need for additional support.   Mental Status Exam: Appearance:   Casual and Fairly Groomed     Behavior:  Appropriate  Motor:  Normal  Speech/Language:   NA  Affect:  Appropriate  Mood:  anxious and irritable  Thought process:  normal  Thought content:    WNL  Sensory/Perceptual disturbances:    WNL  Orientation:  oriented to person, place, time/date, and situation  Attention:  Good  Concentration:  Good  Memory:  WNL  Fund of knowledge:   Good  Insight:    Good  Judgment:   Good  Impulse Control:  Good   Risk Assessment: Danger to Self:  No Self-injurious Behavior: No Danger to Others: No Duty to Warn:no Physical Aggression / Violence:No  Access to Firearms a concern: No   Substance Abuse History: Current substance abuse: No     Past Psychiatric History:   Previous psychological history is significant for anxiety and depression Outpatient Providers: current  therapist History of Psych Hospitalization: No  Psychological Testing:  n/a    Abuse History:  Victim of: No.,  n/a    Report needed: No. Victim of Neglect:No. Witness / Exposure to Domestic Violence: No   Protective Services Involvement: No  Witness to [employer] Violence:  No   Family History:  Family History  Problem Relation Age of Onset   Heart attack Neg Hx    Hypertension Father    Stroke Neg Hx    Schizophrenia Father    Diabetes Mother     Living situation: the patient lives with their family  Sexual Orientation: Straight  Relationship Status: married  Name of spouse / other: Timothy Manis "[name]"  If a parent, number of children / ages: [name] (12) they/them  Support Systems:  Spouse Friends Mother and Licensed conveyancer Stress:  Yes   Income/Employment/Disability: Employment  Financial planner: No   Educational History: Education: Risk manager: N/a  Any cultural differences that may affect / interfere with treatment:  not applicable   Recreation/Hobbies: D&D, movies  Stressors: Family Conflict, Aging parent  Barriers:  None   Legal History: Pending legal issue / charges: The patient has no significant history of legal issues. History of legal issue / charges:  n/a  Medical History/Surgical History: reviewed Past Medical History:  Diagnosis Date   AKI (acute kidney injury) (HCC) 12/22/2015   Anxiety    Diabetes mellitus (HCC)    Essential hypertension    started on lisinopril about 45 days  prior to pancreatitis   Obesity    Pancreatitis, acute 12/22/2015   a. 11/2015 - acute severe pancreatitis with shock/sepsis, complicated by acute respiratory failure, transaminitis, anemia of critical illness, acute encephalopathy, AKI (Cr 1.36), protein-calorie malnutrition with severe hypoalbuminemia, & hypokalemia.   Splenic vein thrombosis     Past Surgical History:  Procedure Laterality Date   BACK  SURGERY     ERCP N/A 01/31/2016   Procedure: ENDOSCOPIC RETROGRADE CHOLANGIOPANCREATOGRAPHY (ERCP);  Surgeon: Jeani Hawking, MD;  Location: [facility] ENDOSCOPY;  Service: Endoscopy;  Laterality: N/A;    Medications: Current Outpatient Medications  Medication Sig Dispense Refill   acetaminophen (TYLENOL) 325 MG tablet Take 650 mg by mouth every 6 (six) hours as needed for fever.     albuterol (PROVENTIL HFA;VENTOLIN HFA) 108 (90 Base) MCG/ACT inhaler Inhale 1-2 puffs into the lungs every 6 (six) hours as needed for wheezing or shortness of breath. 1 Inhaler 0   blood glucose meter kit and supplies Dispense based on patient and insurance preference. Use up to four times daily as directed. (FOR ICD-9 250.00, 250.01). 1 each 0   carvedilol (COREG) 25 MG tablet Take 1 tablet (25 mg total) by mouth 2 (two) times daily. 180 tablet 3   cetirizine (ZYRTEC) 10 MG tablet Take 10 mg by mouth at bedtime.     fluticasone (FLONASE) 50 MCG/ACT nasal spray Place 1 spray into both nostrils at bedtime.     guaiFENesin-codeine 100-10 MG/5ML syrup Take 5 mLs by mouth at bedtime as needed for cough. 120 mL 0   HYDROmorphone (DILAUDID) 1 MG/ML injection Inject 1 mL (1 mg total) into the vein every 4 (four) hours as needed for severe pain. 1 mL 0   imipenem-cilastatin 500 mg in sodium chloride 0.9 % 100 mL Inject 500 mg into the vein every 6 (six) hours.     insulin aspart (NOVOLOG) 100 UNIT/ML FlexPen 8 units with meals (as long as eating 50%) PLUS the following scale: CBG 70 - 120: 0 units CBG 121 - 150: 2 units CBG 151 - 200: 3 units CBG 201 - 250: 5 units CBG 251 - 300: 8 units CBG 301 - 350: 11 units CBG 351 - 400: 15 units (Patient taking differently: Inject 8 Units into the skin 3 (three) times daily with meals. 8 units with meals (as long as eating 50%) PLUS the following scale: CBG 70 - 120: 0 units CBG 121 - 150: 2 units CBG 151 - 200: 3 units CBG 201 - 250: 5 units CBG 251 - 300: 8 units CBG 301 - 350: 11  units CBG 351 - 400: 15 units) 60 mL 11   Insulin Glargine (LANTUS SOLOSTAR) 100 UNIT/ML Solostar Pen Inject 25 Units into the skin daily at 10 pm. 15 mL 11   Insulin Pen Needle 31G X 5 MM MISC For insulin injections 100 each 1   pantoprazole (PROTONIX) 40 MG tablet Take 1 tablet (40 mg total) by mouth at bedtime. 30 tablet 0   No current facility-administered medications for this visit.    No Known Allergies    Individualized Treatment Plan                Strengths: verbal, bright, friendly  Supports: wife, daughter, parents   Goal/Needs for Treatment:  In order of importance to patient 1)  Resolve conflicted feelings, takes steps to change work situation and adapt to the new life circumstances.   2) Learn and implement coping skills that result in  a reduction of anxiety and worry, and improved daily functioning.  3) Increase activities that reinforce a positive self-identity.  4) Learn and implement coping skills that result in a prevention of of relapse of depression and improve daily functioning.    Client Statement of Needs: work on pervasive issues with anxiety, assistance in making a big life change as he contemplates retiring from current position and figures out new direction   Treatment Level: Individual Biweekly Outpatient Psychotherapy  Symptoms: Autonomic hyperactivity (e.g., palpitations, shortness of breath, dry mouth, trouble swallowing, nausea, diarrhea).  Periodic periods of depressed or irritable mood.  Excessive and/or unrealistic worry that is difficult to control occurring more days than not for at least 6 months about a number of events or activities.  Feelings of hopelessness, worthlessness, or inappropriate guilt.  Hypervigilance (e.g., feeling constantly on edge, experiencing concentration difficulties, having trouble falling or staying asleep, exhibiting a general state of irritability).  Low self-esteem.  Behavioral passivity, difficulties initiating  action Motor tension (e.g., restlessness, tiredness, shakiness, muscle tension).  Restlessness and feelings of lost identity and meaning due to retirement.   Client Treatment Preferences: continue with current therapist   Healthcare consumer's goal for treatment:  Psychologist, Hilma Favors, Ph.D. will support the patient's ability to achieve the goals identified. Cognitive Behavioral Therapy, Dialectical Behavioral Therapy, Motivational Interviewing, Behavior Activation, parent skills, and other evidenced-based practices will be used to promote progress towards healthy functioning.   Healthcare consumer Steven Sleppy will: Actively participate in therapy, working towards healthy functioning.    *Justification for Continuation/Discontinuation of Goal: R=Revised, O=Ongoing, A=Achieved, D=Discontinued  Goal 1) Resolve conflicted feelings, takes steps to change work situation and adapt to the new life circumstances.   5 Point Likert rating baseline date: 02/22/2021 Target Date Goal Was reviewed Status Code Progress towards goal/Likert rating  03/08/2023 03/07/2022          O 2/5 - pt has taken initial steps but at times falls into passivity and has made little progress             Goal 2) Learn and implement coping skills that result in a reduction of anxiety and worry, and improved daily functioning.  5 Point Likert rating baseline date: 02/23/2015 Target Date Goal Was reviewed Status Code Progress towards goal/Likert rating  03/08/2023 03/07/2022         O 3/5 - pt has learned coping skills and uses them appropriately but inconsistently             Goal 3) Increase activities that reinforce a positive self-identity.   5 Point Likert rating baseline date: 02/23/2015 Target Date Goal Was reviewed Status Code Progress towards goal/Likert rating  03/08/2023 03/07/2022         O 2/5 - pt has taken initial steps to introduce new activities that have improved his feelings of positive self  esteem             Goal 4) Learn and implement coping skills that result in a prevention of of relapse of depression and improve  daily functioning.   5  Point Likert rating baseline date: 02/23/2015 Target Date Goal Was reviewed Status Code Progress towards goal/Likert rating  03/08/2023 03/07/2022           O  4/5 - pt has learned coping skills and uses them appropriately but inconsistently              This plan has been reviewed and created by the following  participants:  This plan will be reviewed at least every 12 months. Date Behavioral Health Clinician Date Guardian/Patient   03/07/2022  Hilma Favors, Ph.D.   03/07/2022 Timothy [name]                    Diagnoses:  Generalized Anxiety Disorder Phase of Life Change - adult   Raeshaun reports that its been a long hard time since we last met.  We d/e/p several incidents that occurred, how he responded and how he used his skills to cope.  We d/ how avoidance as a defensive strategy doesn't work, only complicates things and often creates the problem he is trying to avoid.  Sabir states that the headhunter called to check in and indicated that the company he spoke to was still interested.  We d/e/p how he responded, his reservations and decision to wait.  I noted that the reservations he had were no longer an issue and that it was likely his anxiety keeping him from moving forward.  He agreed that he was anxious about making a change.  We addressed his anxious thoughts, d/ how to challenge these anxious thoughts and reinforce his ability to cope.  Lastly, I made some connections between his behavior and the past.  I left him with the assignment to reflect on how his early family role was impacting his ability to move forward with his early retirement.    Hilma Favors, PhD

## 2023-01-09 ENCOUNTER — Ambulatory Visit (INDEPENDENT_AMBULATORY_CARE_PROVIDER_SITE_OTHER): Payer: Federal, State, Local not specified - PPO | Admitting: Psychology

## 2023-01-09 DIAGNOSIS — F411 Generalized anxiety disorder: Secondary | ICD-10-CM

## 2023-01-09 DIAGNOSIS — Z600 Problems of adjustment to life-cycle transitions: Secondary | ICD-10-CM

## 2023-01-09 NOTE — Progress Notes (Signed)
PROGRESS NOTE:  Name: Timothy [name] Date: 01/09/2023 MRN: [id number] DOB: 1975-08-18 PCP: Clayborn Heron, MD  Time spent: 8:00 - 8:55 AM  Annual Review: 03/08/2023   Today I met with  Timothy [name] in remote video (Caregility) face-to-face individual psychotherapy.  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  Since the start of treatment, depression has resolved with periodic mild reoccurrences.  Anxiety however, has proven to be chronic and effects all areas of his life.  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  We have begun the process of discerning next steps in his career and professional development.  Timothy [name]'s mother is also aging and he is beginning to see the signs of the need for additional support.   Mental Status Exam: Appearance:   Casual and Fairly Groomed     Behavior:  Appropriate  Motor:  Normal  Speech/Language:   NA  Affect:  Appropriate  Mood:  anxious and irritable  Thought process:  normal  Thought content:    WNL  Sensory/Perceptual disturbances:    WNL  Orientation:  oriented to person, place, time/date, and situation  Attention:  Good  Concentration:  Good  Memory:  WNL  Fund of knowledge:   Good  Insight:    Good  Judgment:   Good  Impulse Control:  Good   Risk Assessment: Danger to Self:  No Self-injurious Behavior: No Danger to Others: No Duty to Warn:no Physical Aggression / Violence:No  Access to Firearms a concern: No   Substance Abuse History: Current substance abuse: No     Past Psychiatric History:   Previous psychological history is significant for anxiety and depression Outpatient Providers: current therapist History of Psych Hospitalization: No   Psychological Testing:  n/a    Abuse History:  Victim of: No.,  n/a    Report needed: No. Victim of Neglect:No. Witness / Exposure to Domestic Violence: No   Protective Services Involvement: No  Witness to [employer] Violence:  No   Family History:  Family History  Problem Relation Age of Onset   Heart attack Neg Hx    Hypertension Father    Stroke Neg Hx    Schizophrenia Father    Diabetes Mother     Living situation: the patient lives with their family  Sexual Orientation: Straight  Relationship Status: married  Name of spouse / other: Timothy [name] "[name]"  If a parent, number of children / ages: Timothy [name] (12) they/them  Support Systems:  Spouse Friends Mother and Licensed conveyancer Stress:  Yes   Income/Employment/Disability: Employment  Financial planner: No   Educational History: Education: Risk manager: N/a  Any cultural differences that may affect / interfere with treatment:  not applicable   Recreation/Hobbies: D&D, movies  Stressors: Family Conflict, Aging parent  Barriers:  None   Legal History: Pending legal issue / charges: The patient has no significant history of legal issues. History of legal issue / charges:  n/a  Medical History/Surgical History: reviewed Past Medical History:  Diagnosis Date   AKI (acute kidney injury) (HCC) 12/22/2015   Anxiety    Diabetes mellitus (HCC)    Essential hypertension    started on lisinopril about 45 days prior to pancreatitis   Obesity  Pancreatitis, acute 12/22/2015   a. 11/2015 - acute severe pancreatitis with shock/sepsis, complicated by acute respiratory failure, transaminitis, anemia of critical illness, acute encephalopathy, AKI (Cr 1.36), protein-calorie malnutrition with severe hypoalbuminemia, & hypokalemia.   Splenic vein thrombosis     Past Surgical History:  Procedure Laterality Date   BACK SURGERY     ERCP N/A 01/31/2016   Procedure:  ENDOSCOPIC RETROGRADE CHOLANGIOPANCREATOGRAPHY (ERCP);  Surgeon: Jeani Hawking, MD;  Location: [facility] ENDOSCOPY;  Service: Endoscopy;  Laterality: N/A;    Medications: Current Outpatient Medications  Medication Sig Dispense Refill   acetaminophen (TYLENOL) 325 MG tablet Take 650 mg by mouth every 6 (six) hours as needed for fever.     albuterol (PROVENTIL HFA;VENTOLIN HFA) 108 (90 Base) MCG/ACT inhaler Inhale 1-2 puffs into the lungs every 6 (six) hours as needed for wheezing or shortness of breath. 1 Inhaler 0   blood glucose meter kit and supplies Dispense based on patient and insurance preference. Use up to four times daily as directed. (FOR ICD-9 250.00, 250.01). 1 each 0   carvedilol (COREG) 25 MG tablet Take 1 tablet (25 mg total) by mouth 2 (two) times daily. 180 tablet 3   cetirizine (ZYRTEC) 10 MG tablet Take 10 mg by mouth at bedtime.     fluticasone (FLONASE) 50 MCG/ACT nasal spray Place 1 spray into both nostrils at bedtime.     guaiFENesin-codeine 100-10 MG/5ML syrup Take 5 mLs by mouth at bedtime as needed for cough. 120 mL 0   HYDROmorphone (DILAUDID) 1 MG/ML injection Inject 1 mL (1 mg total) into the vein every 4 (four) hours as needed for severe pain. 1 mL 0   imipenem-cilastatin 500 mg in sodium chloride 0.9 % 100 mL Inject 500 mg into the vein every 6 (six) hours.     insulin aspart (NOVOLOG) 100 UNIT/ML FlexPen 8 units with meals (as long as eating 50%) PLUS the following scale: CBG 70 - 120: 0 units CBG 121 - 150: 2 units CBG 151 - 200: 3 units CBG 201 - 250: 5 units CBG 251 - 300: 8 units CBG 301 - 350: 11 units CBG 351 - 400: 15 units (Patient taking differently: Inject 8 Units into the skin 3 (three) times daily with meals. 8 units with meals (as long as eating 50%) PLUS the following scale: CBG 70 - 120: 0 units CBG 121 - 150: 2 units CBG 151 - 200: 3 units CBG 201 - 250: 5 units CBG 251 - 300: 8 units CBG 301 - 350: 11 units CBG 351 - 400: 15 units) 60 mL 11    Insulin Glargine (LANTUS SOLOSTAR) 100 UNIT/ML Solostar Pen Inject 25 Units into the skin daily at 10 pm. 15 mL 11   Insulin Pen Needle 31G X 5 MM MISC For insulin injections 100 each 1   pantoprazole (PROTONIX) 40 MG tablet Take 1 tablet (40 mg total) by mouth at bedtime. 30 tablet 0   No current facility-administered medications for this visit.    No Known Allergies    Individualized Treatment Plan                Strengths: verbal, bright, friendly  Supports: wife, daughter, parents   Goal/Needs for Treatment:  In order of importance to patient 1)  Resolve conflicted feelings, takes steps to change work situation and adapt to the new life circumstances.   2) Learn and implement coping skills that result in a reduction of anxiety and worry, and improved daily  functioning.  3) Increase activities that reinforce a positive self-identity.  4) Learn and implement coping skills that result in a prevention of of relapse of depression and improve daily functioning.    Client Statement of Needs: work on pervasive issues with anxiety, assistance in making a big life change as he contemplates retiring from current position and figures out new direction   Treatment Level: Individual Biweekly Outpatient Psychotherapy  Symptoms: Autonomic hyperactivity (e.g., palpitations, shortness of breath, dry mouth, trouble swallowing, nausea, diarrhea).  Periodic periods of depressed or irritable mood.  Excessive and/or unrealistic worry that is difficult to control occurring more days than not for at least 6 months about a number of events or activities.  Feelings of hopelessness, worthlessness, or inappropriate guilt.  Hypervigilance (e.g., feeling constantly on edge, experiencing concentration difficulties, having trouble falling or staying asleep, exhibiting a general state of irritability).  Low self-esteem.  Behavioral passivity, difficulties initiating action Motor tension (e.g., restlessness,  tiredness, shakiness, muscle tension).  Restlessness and feelings of lost identity and meaning due to retirement.   Client Treatment Preferences: continue with current therapist   Healthcare consumer's goal for treatment:  Psychologist, Hilma Favors, Ph.D. will support the patient's ability to achieve the goals identified. Cognitive Behavioral Therapy, Dialectical Behavioral Therapy, Motivational Interviewing, Behavior Activation, parent skills, and other evidenced-based practices will be used to promote progress towards healthy functioning.   Healthcare consumer Jayzon Blakenship will: Actively participate in therapy, working towards healthy functioning.    *Justification for Continuation/Discontinuation of Goal: R=Revised, O=Ongoing, A=Achieved, D=Discontinued  Goal 1) Resolve conflicted feelings, takes steps to change work situation and adapt to the new life circumstances.   5 Point Likert rating baseline date: 02/22/2021 Target Date Goal Was reviewed Status Code Progress towards goal/Likert rating  03/08/2023 03/07/2022          O 2/5 - pt has taken initial steps but at times falls into passivity and has made little progress             Goal 2) Learn and implement coping skills that result in a reduction of anxiety and worry, and improved daily functioning.  5 Point Likert rating baseline date: 02/23/2015 Target Date Goal Was reviewed Status Code Progress towards goal/Likert rating  03/08/2023 03/07/2022         O 3/5 - pt has learned coping skills and uses them appropriately but inconsistently             Goal 3) Increase activities that reinforce a positive self-identity.   5 Point Likert rating baseline date: 02/23/2015 Target Date Goal Was reviewed Status Code Progress towards goal/Likert rating  03/08/2023 03/07/2022         O 2/5 - pt has taken initial steps to introduce new activities that have improved his feelings of positive self esteem             Goal 4) Learn and  implement coping skills that result in a prevention of of relapse of depression and improve  daily functioning.   5  Point Likert rating baseline date: 02/23/2015 Target Date Goal Was reviewed Status Code Progress towards goal/Likert rating  03/08/2023 03/07/2022           O  4/5 - pt has learned coping skills and uses them appropriately but inconsistently              This plan has been reviewed and created by the following participants:  This plan will be reviewed at least  every 12 months. Date Behavioral Health Clinician Date Guardian/Patient   03/07/2022  Hilma Favors, Ph.D.   03/07/2022 Timothy [name]                    Diagnoses:  Generalized Anxiety Disorder Phase of Life Change - Adult   Killion reports that his father-in-law was running a high fever this and they discovered he was concealing a bigger health issue.  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  We talked about his having a hard time moving forward and "waiting for the perfect time."    I gently challenged Antionio on avoiding "pulling the trigger" on his early retirement.  We d/e/p the anxious ruminations that are getting in his way and how to challenge each one.  We were then able to move forward with an action plan.    Hilma Favors, PhD

## 2023-01-23 ENCOUNTER — Ambulatory Visit (INDEPENDENT_AMBULATORY_CARE_PROVIDER_SITE_OTHER): Payer: Federal, State, Local not specified - PPO | Admitting: Psychology

## 2023-01-23 DIAGNOSIS — F411 Generalized anxiety disorder: Secondary | ICD-10-CM | POA: Diagnosis not present

## 2023-01-23 NOTE — Progress Notes (Signed)
PROGRESS NOTE:  Name: Timothy [name] Date: 01/23/2023 MRN: [id number] DOB: 1976-02-09 PCP: Clayborn Heron, MD  Time spent: 8:00 - 8:55 AM  Annual Review: 03/08/2023   Today I met with  Timothy [name] in remote video (Caregility) face-to-face individual psychotherapy.  Distance Site: Client's Home Orginating Site: Dr Odette Horns Remote Office Consent: Obtained verbal consent to transmit session remotely.  Patient is aware of the limitations related to conducting virtual therapy.    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  Since the start of treatment, depression has resolved with periodic mild reoccurrences.  Anxiety however, has proven to be chronic and effects all areas of his life.  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  We have begun the process of discerning next steps in his career and professional development.  Timothy [name]'s mother is also aging and he is beginning to see the signs of the need for additional support.   Mental Status Exam: Appearance:   Casual and Fairly Groomed     Behavior:  Appropriate  Motor:  Normal  Speech/Language:   NA  Affect:  Appropriate  Mood:  anxious and irritable  Thought process:  normal  Thought content:    WNL  Sensory/Perceptual disturbances:    WNL  Orientation:  oriented to person, place, time/date, and situation  Attention:  Good  Concentration:  Good  Memory:  WNL  Fund of knowledge:   Good  Insight:    Good  Judgment:   Good  Impulse Control:  Good   Risk Assessment: Danger to Self:  No Self-injurious Behavior: No Danger to Others: No Duty to Warn:no Physical Aggression / Violence:No  Access to Firearms a concern: No   Substance Abuse History: Current substance abuse: No     Past Psychiatric History:   Previous psychological history is significant for anxiety and  depression Outpatient Providers: current therapist History of Psych Hospitalization: No  Psychological Testing:  n/a    Abuse History:  Victim of: No.,  n/a    Report needed: No. Victim of Neglect:No. Witness / Exposure to Domestic Violence: No   Protective Services Involvement: No  Witness to [employer] Violence:  No   Family History:  Family History  Problem Relation Age of Onset   Heart attack Neg Hx    Hypertension Father    Stroke Neg Hx    Schizophrenia Father    Diabetes Mother     Living situation: the patient lives with their family  Sexual Orientation: Straight  Relationship Status: married  Name of spouse / other: Timothy Manis "[name]"  If a parent, number of children / ages: Timothy [name] (12) they/them  Support Systems:  Spouse Friends Mother and Licensed conveyancer Stress:  Yes   Income/Employment/Disability: Employment  Financial planner: No   Educational History: Education: Risk manager: N/a  Any cultural differences that may affect / interfere with treatment:  not applicable   Recreation/Hobbies: D&D, movies  Stressors: Family Conflict, Aging parent  Barriers:  None   Legal History: Pending legal issue / charges: The patient has no significant history of legal issues. History of legal issue / charges:  n/a  Medical History/Surgical History: reviewed Past Medical History:  Diagnosis Date   AKI (acute kidney injury) (HCC) 12/22/2015   Anxiety    Diabetes mellitus (HCC)    Essential hypertension    started on  lisinopril about 45 days prior to pancreatitis   Obesity    Pancreatitis, acute 12/22/2015   a. 11/2015 - acute severe pancreatitis with shock/sepsis, complicated by acute respiratory failure, transaminitis, anemia of critical illness, acute encephalopathy, AKI (Cr 1.36), protein-calorie malnutrition with severe hypoalbuminemia, & hypokalemia.   Splenic vein thrombosis     Past Surgical  History:  Procedure Laterality Date   BACK SURGERY     ERCP N/A 01/31/2016   Procedure: ENDOSCOPIC RETROGRADE CHOLANGIOPANCREATOGRAPHY (ERCP);  Surgeon: Jeani Hawking, MD;  Location: [facility] ENDOSCOPY;  Service: Endoscopy;  Laterality: N/A;    Medications: Current Outpatient Medications  Medication Sig Dispense Refill   acetaminophen (TYLENOL) 325 MG tablet Take 650 mg by mouth every 6 (six) hours as needed for fever.     albuterol (PROVENTIL HFA;VENTOLIN HFA) 108 (90 Base) MCG/ACT inhaler Inhale 1-2 puffs into the lungs every 6 (six) hours as needed for wheezing or shortness of breath. 1 Inhaler 0   blood glucose meter kit and supplies Dispense based on patient and insurance preference. Use up to four times daily as directed. (FOR ICD-9 250.00, 250.01). 1 each 0   carvedilol (COREG) 25 MG tablet Take 1 tablet (25 mg total) by mouth 2 (two) times daily. 180 tablet 3   cetirizine (ZYRTEC) 10 MG tablet Take 10 mg by mouth at bedtime.     fluticasone (FLONASE) 50 MCG/ACT nasal spray Place 1 spray into both nostrils at bedtime.     guaiFENesin-codeine 100-10 MG/5ML syrup Take 5 mLs by mouth at bedtime as needed for cough. 120 mL 0   HYDROmorphone (DILAUDID) 1 MG/ML injection Inject 1 mL (1 mg total) into the vein every 4 (four) hours as needed for severe pain. 1 mL 0   imipenem-cilastatin 500 mg in sodium chloride 0.9 % 100 mL Inject 500 mg into the vein every 6 (six) hours.     insulin aspart (NOVOLOG) 100 UNIT/ML FlexPen 8 units with meals (as long as eating 50%) PLUS the following scale: CBG 70 - 120: 0 units CBG 121 - 150: 2 units CBG 151 - 200: 3 units CBG 201 - 250: 5 units CBG 251 - 300: 8 units CBG 301 - 350: 11 units CBG 351 - 400: 15 units (Patient taking differently: Inject 8 Units into the skin 3 (three) times daily with meals. 8 units with meals (as long as eating 50%) PLUS the following scale: CBG 70 - 120: 0 units CBG 121 - 150: 2 units CBG 151 - 200: 3 units CBG 201 - 250: 5  units CBG 251 - 300: 8 units CBG 301 - 350: 11 units CBG 351 - 400: 15 units) 60 mL 11   Insulin Glargine (LANTUS SOLOSTAR) 100 UNIT/ML Solostar Pen Inject 25 Units into the skin daily at 10 pm. 15 mL 11   Insulin Pen Needle 31G X 5 MM MISC For insulin injections 100 each 1   pantoprazole (PROTONIX) 40 MG tablet Take 1 tablet (40 mg total) by mouth at bedtime. 30 tablet 0   No current facility-administered medications for this visit.    No Known Allergies    Individualized Treatment Plan                Strengths: verbal, bright, friendly  Supports: wife, daughter, parents   Goal/Needs for Treatment:  In order of importance to patient 1)  Resolve conflicted feelings, takes steps to change work situation and adapt to the new life circumstances.   2) Learn and implement coping  skills that result in a reduction of anxiety and worry, and improved daily functioning.  3) Increase activities that reinforce a positive self-identity.  4) Learn and implement coping skills that result in a prevention of of relapse of depression and improve daily functioning.    Client Statement of Needs: work on pervasive issues with anxiety, assistance in making a big life change as he contemplates retiring from current position and figures out new direction   Treatment Level: Individual Biweekly Outpatient Psychotherapy  Symptoms: Autonomic hyperactivity (e.g., palpitations, shortness of breath, dry mouth, trouble swallowing, nausea, diarrhea).  Periodic periods of depressed or irritable mood.  Excessive and/or unrealistic worry that is difficult to control occurring more days than not for at least 6 months about a number of events or activities.  Feelings of hopelessness, worthlessness, or inappropriate guilt.  Hypervigilance (e.g., feeling constantly on edge, experiencing concentration difficulties, having trouble falling or staying asleep, exhibiting a general state of irritability).  Low self-esteem.   Behavioral passivity, difficulties initiating action Motor tension (e.g., restlessness, tiredness, shakiness, muscle tension).  Restlessness and feelings of lost identity and meaning due to retirement.   Client Treatment Preferences: continue with current therapist   Healthcare consumer's goal for treatment:  Psychologist, Hilma Favors, Ph.D. will support the patient's ability to achieve the goals identified. Cognitive Behavioral Therapy, Dialectical Behavioral Therapy, Motivational Interviewing, Behavior Activation, parent skills, and other evidenced-based practices will be used to promote progress towards healthy functioning.   Healthcare consumer Neiko Trivedi will: Actively participate in therapy, working towards healthy functioning.    *Justification for Continuation/Discontinuation of Goal: R=Revised, O=Ongoing, A=Achieved, D=Discontinued  Goal 1) Resolve conflicted feelings, takes steps to change work situation and adapt to the new life circumstances.   5 Point Likert rating baseline date: 02/22/2021 Target Date Goal Was reviewed Status Code Progress towards goal/Likert rating  03/08/2023 03/07/2022          O 2/5 - pt has taken initial steps but at times falls into passivity and has made little progress             Goal 2) Learn and implement coping skills that result in a reduction of anxiety and worry, and improved daily functioning.  5 Point Likert rating baseline date: 02/23/2015 Target Date Goal Was reviewed Status Code Progress towards goal/Likert rating  03/08/2023 03/07/2022         O 3/5 - pt has learned coping skills and uses them appropriately but inconsistently             Goal 3) Increase activities that reinforce a positive self-identity.   5 Point Likert rating baseline date: 02/23/2015 Target Date Goal Was reviewed Status Code Progress towards goal/Likert rating  03/08/2023 03/07/2022         O 2/5 - pt has taken initial steps to introduce new activities  that have improved his feelings of positive self esteem             Goal 4) Learn and implement coping skills that result in a prevention of of relapse of depression and improve  daily functioning.   5  Point Likert rating baseline date: 02/23/2015 Target Date Goal Was reviewed Status Code Progress towards goal/Likert rating  03/08/2023 03/07/2022           O  4/5 - pt has learned coping skills and uses them appropriately but inconsistently              This plan has been reviewed and  created by the following participants:  This plan will be reviewed at least every 12 months. Date Behavioral Health Clinician Date Guardian/Patient   03/07/2022  Hilma Favors, Ph.D.   03/07/2022 Timothy [name]                    Diagnoses:  Generalized Anxiety Disorder Phase of Life Change - Adult   Marsean reports that he took to heart something we d/ in our last session.  He was more observant these past two week and confirmed what was really happening.  In session, we were able to address his negative thinking, desire to please and adjust his focus.  I was able to support his positive efforts and provide additional guidance.  Asier states he wasn't able to pick up his daughter from camp, and it has him thinking about the things he is missing by staying at his current job.  We talked about how to use this experience to keep him motivated and moving forward.    Hilma Favors, PhD

## 2023-02-06 ENCOUNTER — Ambulatory Visit: Payer: Federal, State, Local not specified - PPO | Admitting: Psychology

## 2023-02-20 ENCOUNTER — Ambulatory Visit: Payer: Federal, State, Local not specified - PPO | Admitting: Psychology

## 2023-03-06 ENCOUNTER — Ambulatory Visit: Payer: Federal, State, Local not specified - PPO | Admitting: Psychology

## 2023-03-06 DIAGNOSIS — Z600 Problems of adjustment to life-cycle transitions: Secondary | ICD-10-CM

## 2023-03-06 DIAGNOSIS — F411 Generalized anxiety disorder: Secondary | ICD-10-CM | POA: Diagnosis not present

## 2023-03-06 NOTE — Progress Notes (Signed)
PROGRESS NOTE:  Name: Timothy [name] Date: 03/06/2023 MRN: [id number] DOB: 12/15/1975 PCP: Clayborn Heron, MD  Time spent: 8:04 - 9:02 AM  Annual Review: 03/08/2023   Today I met with  Timothy [name] in remote video (Caregility) face-to-face individual psychotherapy.  Distance Site: Client's Home Orginating Site: Dr Odette Horns Remote Office Consent: Obtained verbal consent to transmit session remotely.   Patient is aware of the limitations related to conducting virtual therapy.    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  Since the start of treatment, depression has resolved with periodic mild reoccurrences.  Anxiety however, has proven to be chronic and effects all areas of his life.  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  We have begun the process of discerning next steps in his career and professional development.  Carlyle's mother is also aging and he is beginning to see the signs of the need for additional support.   Mental Status Exam: Appearance:   Casual and Fairly Groomed     Behavior:  Appropriate  Motor:  Normal  Speech/Language:   NA  Affect:  Appropriate  Mood:  anxious and irritable  Thought process:  normal  Thought content:    WNL  Sensory/Perceptual disturbances:    WNL  Orientation:  oriented to person, place, time/date, and situation  Attention:  Good  Concentration:  Good  Memory:  WNL  Fund of knowledge:   Good  Insight:    Good  Judgment:   Good  Impulse Control:  Good   Risk Assessment: Danger to Self:  No Self-injurious Behavior: No Danger to Others: No Duty to Warn:no Physical Aggression / Violence:No  Access to Firearms a concern: No   Substance Abuse History: Current substance abuse: No     Past Psychiatric History:   Previous psychological history is significant for anxiety and  depression Outpatient Providers: current therapist History of Psych Hospitalization: No  Psychological Testing:  n/a    Abuse History:  Victim of: No.,  n/a    Report needed: No. Victim of Neglect:No. Witness / Exposure to Domestic Violence: No   Protective Services Involvement: No  Witness to [employer] Violence:  No   Family History:  Family History  Problem Relation Age of Onset   Heart attack Neg Hx    Hypertension Father    Stroke Neg Hx    Schizophrenia Father    Diabetes Mother     Living situation: the patient lives with their family  Sexual Orientation: Straight  Relationship Status: married  Name of spouse / other: Timothy [name] "Timothy [name]"  If a parent, number of children / ages: Timothy [name] (12) they/them  Support Systems:  Spouse Friends Mother and Licensed conveyancer Stress:  Yes   Income/Employment/Disability: Employment  Financial planner: No   Educational History: Education: Risk manager: N/a  Any cultural differences that may affect / interfere with treatment:  not applicable   Recreation/Hobbies: D&D, movies  Stressors: Family Conflict, Aging parent  Barriers:  None   Legal History: Pending legal issue / charges: The patient has no significant history of legal issues. History of legal issue / charges:  n/a  Medical History/Surgical History: reviewed Past Medical History:  Diagnosis Date   AKI (acute kidney injury) (HCC) 12/22/2015   Anxiety    Diabetes mellitus (HCC)    Essential hypertension    started  on lisinopril about 45 days prior to pancreatitis   Obesity    Pancreatitis, acute 12/22/2015   a. 11/2015 - acute severe pancreatitis with shock/sepsis, complicated by acute respiratory failure, transaminitis, anemia of critical illness, acute encephalopathy, AKI (Cr 1.36), protein-calorie malnutrition with severe hypoalbuminemia, & hypokalemia.   Splenic vein thrombosis     Past Surgical  History:  Procedure Laterality Date   BACK SURGERY     ERCP N/A 01/31/2016   Procedure: ENDOSCOPIC RETROGRADE CHOLANGIOPANCREATOGRAPHY (ERCP);  Surgeon: Jeani Hawking, MD;  Location: [facility] ENDOSCOPY;  Service: Endoscopy;  Laterality: N/A;    Medications: Current Outpatient Medications  Medication Sig Dispense Refill   acetaminophen (TYLENOL) 325 MG tablet Take 650 mg by mouth every 6 (six) hours as needed for fever.     albuterol (PROVENTIL HFA;VENTOLIN HFA) 108 (90 Base) MCG/ACT inhaler Inhale 1-2 puffs into the lungs every 6 (six) hours as needed for wheezing or shortness of breath. 1 Inhaler 0   blood glucose meter kit and supplies Dispense based on patient and insurance preference. Use up to four times daily as directed. (FOR ICD-9 250.00, 250.01). 1 each 0   carvedilol (COREG) 25 MG tablet Take 1 tablet (25 mg total) by mouth 2 (two) times daily. 180 tablet 3   cetirizine (ZYRTEC) 10 MG tablet Take 10 mg by mouth at bedtime.     fluticasone (FLONASE) 50 MCG/ACT nasal spray Place 1 spray into both nostrils at bedtime.     guaiFENesin-codeine 100-10 MG/5ML syrup Take 5 mLs by mouth at bedtime as needed for cough. 120 mL 0   HYDROmorphone (DILAUDID) 1 MG/ML injection Inject 1 mL (1 mg total) into the vein every 4 (four) hours as needed for severe pain. 1 mL 0   imipenem-cilastatin 500 mg in sodium chloride 0.9 % 100 mL Inject 500 mg into the vein every 6 (six) hours.     insulin aspart (NOVOLOG) 100 UNIT/ML FlexPen 8 units with meals (as long as eating 50%) PLUS the following scale: CBG 70 - 120: 0 units CBG 121 - 150: 2 units CBG 151 - 200: 3 units CBG 201 - 250: 5 units CBG 251 - 300: 8 units CBG 301 - 350: 11 units CBG 351 - 400: 15 units (Patient taking differently: Inject 8 Units into the skin 3 (three) times daily with meals. 8 units with meals (as long as eating 50%) PLUS the following scale: CBG 70 - 120: 0 units CBG 121 - 150: 2 units CBG 151 - 200: 3 units CBG 201 - 250: 5  units CBG 251 - 300: 8 units CBG 301 - 350: 11 units CBG 351 - 400: 15 units) 60 mL 11   Insulin Glargine (LANTUS SOLOSTAR) 100 UNIT/ML Solostar Pen Inject 25 Units into the skin daily at 10 pm. 15 mL 11   Insulin Pen Needle 31G X 5 MM MISC For insulin injections 100 each 1   pantoprazole (PROTONIX) 40 MG tablet Take 1 tablet (40 mg total) by mouth at bedtime. 30 tablet 0   No current facility-administered medications for this visit.    No Known Allergies    Individualized Treatment Plan                Strengths: verbal, bright, friendly  Supports: wife, daughter, parents   Goal/Needs for Treatment:  In order of importance to patient 1)  Resolve conflicted feelings, takes steps to change work situation and adapt to the new life circumstances.   2) Learn and implement  coping skills that result in a reduction of anxiety and worry, and improved daily functioning.  3) Increase activities that reinforce a positive self-identity.  4) Learn and implement coping skills that result in a prevention of of relapse of depression and improve daily functioning.    Client Statement of Needs: work on pervasive issues with anxiety, assistance in making a big life change as he contemplates retiring from current position and figures out new direction   Treatment Level: Individual Biweekly Outpatient Psychotherapy  Symptoms: Autonomic hyperactivity (e.g., palpitations, shortness of breath, dry mouth, trouble swallowing, nausea, diarrhea).  Periodic periods of depressed or irritable mood.  Excessive and/or unrealistic worry that is difficult to control occurring more days than not for at least 6 months about a number of events or activities.  Feelings of hopelessness, worthlessness, or inappropriate guilt.  Hypervigilance (e.g., feeling constantly on edge, experiencing concentration difficulties, having trouble falling or staying asleep, exhibiting a general state of irritability).  Low self-esteem.   Behavioral passivity, difficulties initiating action Motor tension (e.g., restlessness, tiredness, shakiness, muscle tension).  Restlessness and feelings of lost identity and meaning due to retirement.   Client Treatment Preferences: continue with current therapist   Healthcare consumer's goal for treatment:  Psychologist, Hilma Favors, Ph.D. will support the patient's ability to achieve the goals identified. Cognitive Behavioral Therapy, Dialectical Behavioral Therapy, Motivational Interviewing, Behavior Activation, parent skills, and other evidenced-based practices will be used to promote progress towards healthy functioning.   Healthcare consumer Ethan Mckinney will: Actively participate in therapy, working towards healthy functioning.    *Justification for Continuation/Discontinuation of Goal: R=Revised, O=Ongoing, A=Achieved, D=Discontinued  Goal 1) Resolve conflicted feelings, takes steps to change work situation and adapt to the new life circumstances.   5 Point Likert rating baseline date: 02/22/2021 Target Date Goal Was reviewed Status Code Progress towards goal/Likert rating  03/08/2023 03/07/2022          O 2/5 - pt has taken initial steps but at times falls into passivity and has made little progress             Goal 2) Learn and implement coping skills that result in a reduction of anxiety and worry, and improved daily functioning.  5 Point Likert rating baseline date: 02/23/2015 Target Date Goal Was reviewed Status Code Progress towards goal/Likert rating  03/08/2023 03/07/2022         O 3/5 - pt has learned coping skills and uses them appropriately but inconsistently             Goal 3) Increase activities that reinforce a positive self-identity.   5 Point Likert rating baseline date: 02/23/2015 Target Date Goal Was reviewed Status Code Progress towards goal/Likert rating  03/08/2023 03/07/2022         O 2/5 - pt has taken initial steps to introduce new activities  that have improved his feelings of positive self esteem             Goal 4) Learn and implement coping skills that result in a prevention of of relapse of depression and improve  daily functioning.   5  Point Likert rating baseline date: 02/23/2015 Target Date Goal Was reviewed Status Code Progress towards goal/Likert rating  03/08/2023 03/07/2022           O  4/5 - pt has learned coping skills and uses them appropriately but inconsistently              This plan has been reviewed  and created by the following participants:  This plan will be reviewed at least every 12 months. Date Behavioral Health Clinician Date Guardian/Patient   03/07/2022  Hilma Favors, Ph.D.   03/07/2022 Timothy [name]                    Diagnoses:  Generalized Anxiety Disorder Phase of Life Change - Adult   Queshawn reports that he's planning to turn in his resignation tomorrow.  We d/e/p his anxiety about how his resignation will be received, and fear of the unknown.  We then worked through some strategies for how to grapple with the unknown by focusing on what he does know.  I provided the support and the guidance needed to feel confident in moving forward. Hilma Favors, PhD

## 2023-03-15 ENCOUNTER — Ambulatory Visit (INDEPENDENT_AMBULATORY_CARE_PROVIDER_SITE_OTHER): Payer: Federal, State, Local not specified - PPO | Admitting: Psychology

## 2023-03-15 DIAGNOSIS — Z600 Problems of adjustment to life-cycle transitions: Secondary | ICD-10-CM | POA: Diagnosis not present

## 2023-03-15 DIAGNOSIS — F411 Generalized anxiety disorder: Secondary | ICD-10-CM | POA: Diagnosis not present

## 2023-03-15 NOTE — Progress Notes (Signed)
PROGRESS NOTE:  Name: Timothy [name] Date: 03/15/2023 MRN: [id number] DOB: 1975/08/18 PCP: Clayborn Heron, MD  Time spent: 10:01AM - 11:02 AM  Annual Review: 03/20/2023   Today I met with  [name] in remote video (Caregility) face-to-face individual psychotherapy.  Distance Site: Client's Home Orginating Site: Dr Odette Horns Remote Office Consent: Obtained verbal consent to transmit session remotely.   Patient is aware of the limitations related to conducting virtual therapy.    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  Since the start of treatment, depression has resolved with periodic mild reoccurrences.  Anxiety however, has proven to be chronic and effects all areas of his life.  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  We have begun the process of discerning next steps in his career and professional development.  Timothy [name] mother is also aging and he is beginning to see the signs of the need for additional support.   Mental Status Exam: Appearance:   Casual and Fairly Groomed     Behavior:  Appropriate  Motor:  Normal  Speech/Language:   NA  Affect:  Appropriate  Mood:  anxious and irritable  Thought process:  normal  Thought content:    WNL  Sensory/Perceptual disturbances:    WNL  Orientation:  oriented to person, place, time/date, and situation  Attention:  Good  Concentration:  Good  Memory:  WNL  Fund of knowledge:   Good  Insight:    Good  Judgment:   Good  Impulse Control:  Good   Risk Assessment: Danger to Self:  No Self-injurious Behavior: No Danger to Others: No Duty to Warn:no Physical Aggression / Violence:No  Access to Firearms a concern: No   Substance Abuse History: Current substance abuse: No     Past Psychiatric History:   Previous psychological history is significant for anxiety and  depression Outpatient Providers: current therapist History of Psych Hospitalization: No  Psychological Testing:  n/a    Abuse History:  Victim of: No.,  n/a    Report needed: No. Victim of Neglect:No. Witness / Exposure to Domestic Violence: No   Protective Services Involvement: No  Witness to [employer] Violence:  No   Family History:  Family History  Problem Relation Age of Onset   Heart attack Neg Hx    Hypertension Father    Stroke Neg Hx    Schizophrenia Father    Diabetes Mother     Living situation: the patient lives with their family  Sexual Orientation: Straight  Relationship Status: married  Name of spouse / other: Timothy Manis "[name]"  If a parent, number of children / ages: Timothy [name] (12) they/them  Support Systems:  Spouse Friends Mother and Licensed conveyancer Stress:  Yes   Income/Employment/Disability: Employment  Financial planner: No   Educational History: Education: Risk manager: N/a  Any cultural differences that may affect / interfere with treatment:  not applicable   Recreation/Hobbies: D&D, movies  Stressors: Family Conflict, Aging parent  Barriers:  None   Legal History: Pending legal issue / charges: The patient has no significant history of legal issues. History of legal issue / charges:  n/a  Medical History/Surgical History: reviewed Past Medical History:  Diagnosis Date   AKI (acute kidney injury) (HCC) 12/22/2015   Anxiety    Diabetes mellitus (HCC)    Essential hypertension  started on lisinopril about 45 days prior to pancreatitis   Obesity    Pancreatitis, acute 12/22/2015   a. 11/2015 - acute severe pancreatitis with shock/sepsis, complicated by acute respiratory failure, transaminitis, anemia of critical illness, acute encephalopathy, AKI (Cr 1.36), protein-calorie malnutrition with severe hypoalbuminemia, & hypokalemia.   Splenic vein thrombosis     Past Surgical  History:  Procedure Laterality Date   BACK SURGERY     ERCP N/A 01/31/2016   Procedure: ENDOSCOPIC RETROGRADE CHOLANGIOPANCREATOGRAPHY (ERCP);  Surgeon: Jeani Hawking, MD;  Location: [facility] ENDOSCOPY;  Service: Endoscopy;  Laterality: N/A;    Medications: Current Outpatient Medications  Medication Sig Dispense Refill   acetaminophen (TYLENOL) 325 MG tablet Take 650 mg by mouth every 6 (six) hours as needed for fever.     albuterol (PROVENTIL HFA;VENTOLIN HFA) 108 (90 Base) MCG/ACT inhaler Inhale 1-2 puffs into the lungs every 6 (six) hours as needed for wheezing or shortness of breath. 1 Inhaler 0   blood glucose meter kit and supplies Dispense based on patient and insurance preference. Use up to four times daily as directed. (FOR ICD-9 250.00, 250.01). 1 each 0   carvedilol (COREG) 25 MG tablet Take 1 tablet (25 mg total) by mouth 2 (two) times daily. 180 tablet 3   cetirizine (ZYRTEC) 10 MG tablet Take 10 mg by mouth at bedtime.     fluticasone (FLONASE) 50 MCG/ACT nasal spray Place 1 spray into both nostrils at bedtime.     guaiFENesin-codeine 100-10 MG/5ML syrup Take 5 mLs by mouth at bedtime as needed for cough. 120 mL 0   HYDROmorphone (DILAUDID) 1 MG/ML injection Inject 1 mL (1 mg total) into the vein every 4 (four) hours as needed for severe pain. 1 mL 0   imipenem-cilastatin 500 mg in sodium chloride 0.9 % 100 mL Inject 500 mg into the vein every 6 (six) hours.     insulin aspart (NOVOLOG) 100 UNIT/ML FlexPen 8 units with meals (as long as eating 50%) PLUS the following scale: CBG 70 - 120: 0 units CBG 121 - 150: 2 units CBG 151 - 200: 3 units CBG 201 - 250: 5 units CBG 251 - 300: 8 units CBG 301 - 350: 11 units CBG 351 - 400: 15 units (Patient taking differently: Inject 8 Units into the skin 3 (three) times daily with meals. 8 units with meals (as long as eating 50%) PLUS the following scale: CBG 70 - 120: 0 units CBG 121 - 150: 2 units CBG 151 - 200: 3 units CBG 201 - 250: 5  units CBG 251 - 300: 8 units CBG 301 - 350: 11 units CBG 351 - 400: 15 units) 60 mL 11   Insulin Glargine (LANTUS SOLOSTAR) 100 UNIT/ML Solostar Pen Inject 25 Units into the skin daily at 10 pm. 15 mL 11   Insulin Pen Needle 31G X 5 MM MISC For insulin injections 100 each 1   pantoprazole (PROTONIX) 40 MG tablet Take 1 tablet (40 mg total) by mouth at bedtime. 30 tablet 0   No current facility-administered medications for this visit.    No Known Allergies    Individualized Treatment Plan                Strengths: verbal, bright, friendly  Supports: wife, daughter, parents   Goal/Needs for Treatment:  In order of importance to patient 1)  Resolve conflicted feelings, takes steps to change work situation and adapt to the new life circumstances.   2) Learn and  implement coping skills that result in a reduction of anxiety and worry, and improved daily functioning.  3) Increase activities that reinforce a positive self-identity.  4) Learn and implement coping skills that result in a prevention of of relapse of depression and improve daily functioning.    Client Statement of Needs: work on pervasive issues with anxiety, assistance in making a big life change as he contemplates retiring from current position and figures out new direction   Treatment Level: Individual Biweekly Outpatient Psychotherapy  Symptoms: Autonomic hyperactivity (e.g., palpitations, shortness of breath, dry mouth, trouble swallowing, nausea, diarrhea).  Periodic periods of depressed or irritable mood.  Excessive and/or unrealistic worry that is difficult to control occurring more days than not for at least 6 months about a number of events or activities.  Feelings of hopelessness, worthlessness, or inappropriate guilt.  Hypervigilance (e.g., feeling constantly on edge, experiencing concentration difficulties, having trouble falling or staying asleep, exhibiting a general state of irritability).  Low self-esteem.   Behavioral passivity, difficulties initiating action Motor tension (e.g., restlessness, tiredness, shakiness, muscle tension).  Restlessness and feelings of lost identity and meaning due to retirement.   Client Treatment Preferences: continue with current therapist   Healthcare consumer's goal for treatment:  Psychologist, Hilma Favors, Ph.D. will support the patient's ability to achieve the goals identified. Cognitive Behavioral Therapy, Dialectical Behavioral Therapy, Motivational Interviewing, Behavior Activation, parent skills, and other evidenced-based practices will be used to promote progress towards healthy functioning.   Healthcare consumer Jamyson Mitcham will: Actively participate in therapy, working towards healthy functioning.    *Justification for Continuation/Discontinuation of Goal: R=Revised, O=Ongoing, A=Achieved, D=Discontinued  Goal 1) Resolve conflicted feelings, takes steps to change work situation and adapt to the new life circumstances.   5 Point Likert rating baseline date: 02/22/2021 Target Date Goal Was reviewed Status Code Progress towards goal/Likert rating  03/20/2023 03/07/2022          O 2/5 - pt has taken initial steps but at times falls into passivity and has made little progress             Goal 2) Learn and implement coping skills that result in a reduction of anxiety and worry, and improved daily functioning.  5 Point Likert rating baseline date: 02/23/2015 Target Date Goal Was reviewed Status Code Progress towards goal/Likert rating  03/20/2023 03/07/2022          O 3/5 - pt has learned coping skills and uses them appropriately but inconsistently             Goal 3) Increase activities that reinforce a positive self-identity.   5 Point Likert rating baseline date: 02/23/2015 Target Date Goal Was reviewed Status Code Progress towards goal/Likert rating  03/20/2023 03/07/2022          O 2/5 - pt has taken initial steps to introduce new activities  that have improved his feelings of positive self esteem             Goal 4) Learn and implement coping skills that result in a prevention of of relapse of depression and improve  daily functioning.   5  Point Likert rating baseline date: 02/23/2015 Target Date Goal Was reviewed Status Code Progress towards goal/Likert rating  03/20/2023 03/07/2022           O  4/5 - pt has learned coping skills and uses them appropriately but inconsistently              This plan  has been reviewed and created by the following participants:  This plan will be reviewed at least every 12 months. Date Behavioral Health Clinician Date Guardian/Patient   03/07/2022  Hilma Favors, Ph.D.   03/07/2022 [name]                    Diagnoses:  Generalized Anxiety Disorder Phase of Life Change - Adult  Note: Pt had an acute issue which required attention.  Therefore pt's annual review was not conducted, and remains scheduled during his next appointment (8/19).  At that time target dates and goals will be updated.   Chrstopher reached out for an emergency appointment.  He reports that he turned in his request for an "early out" (retirement) which was then denied.  He states that he has alternating been in states of anxiety and anger.  We used this extra session to d/e/p what occurred, identified his anxious thought distortions, reviewed his real options and clamed his heightened state of emotions.  By the end of the session, Manard felt supported, received the guidance he needed and had an actionable plan.   Hilma Favors, PhD

## 2023-03-20 ENCOUNTER — Ambulatory Visit: Payer: Federal, State, Local not specified - PPO | Admitting: Psychology

## 2023-03-20 DIAGNOSIS — Z600 Problems of adjustment to life-cycle transitions: Secondary | ICD-10-CM | POA: Diagnosis not present

## 2023-03-20 DIAGNOSIS — F411 Generalized anxiety disorder: Secondary | ICD-10-CM | POA: Diagnosis not present

## 2023-03-20 NOTE — Progress Notes (Signed)
PROGRESS NOTE:  Name: Timothy [name] Date: 03/20/2023 MRN: [id number] DOB: January 31, 1976 PCP: Timothy Heron, MD  Time spent: 8:01AM - 8:59AM  Annual Review: 03/19/2024   Today I met with  Timothy [name] in remote video (Caregility) face-to-face individual psychotherapy.  Distance Site: Client's Home Orginating Site: Dr Timothy [name] Remote Office Consent: Obtained verbal consent to transmit session remotely.   Patient is aware of the limitations related to conducting virtual therapy.    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  Since the start of treatment, depression has resolved with periodic mild reoccurrences.  Anxiety however, has proven to be chronic and effects all areas of his life.  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  Unfortunately, he application for early retirement was denied and has created a new series of questions about his future work path.  Therefore, we have continued the process of discerning next steps in his career and professional development.  Timothy [name]'s mother is aging and he is beginning to see the signs of the need for additional support and negotiating way to provide the needed support and guidance.   Mental Status Exam: Appearance:   Casual and Fairly Groomed     Behavior:  Appropriate  Motor:  Normal  Speech/Language:   NA  Affect:  Appropriate  Mood:  anxious and irritable  Thought process:  normal  Thought content:    WNL  Sensory/Perceptual disturbances:    WNL  Orientation:  oriented to person, place, time/date, and situation  Attention:  Good  Concentration:  Good  Memory:  WNL  Fund of knowledge:   Good  Insight:    Good  Judgment:   Good  Impulse Control:  Good   Risk Assessment: Danger to Self:  No Self-injurious Behavior: No Danger to Others:  No Duty to Warn:no Physical Aggression / Violence:No  Access to Firearms a concern: No   Substance Abuse History: Current substance abuse: No     Past Psychiatric History:   Previous psychological history is significant for anxiety and depression Outpatient Providers: current therapist History of Psych Hospitalization: No  Psychological Testing:  n/a    Abuse History:  Victim of: No.,  n/a    Report needed: No. Victim of Neglect:No. 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  If a parent, number of children / ages: Timothy [name] (68) they/them  Support Systems:  Spouse Friends Mother and Father-In-Law Daughter  Surveyor, quantity Stress:  Yes   Income/Employment/Disability: Employment  Financial planner: No   Educational History: Education: Risk manager: N/a  Any cultural differences that may affect / interfere with treatment:  not applicable   Recreation/Hobbies: D&D, movies  Stressors: Family Conflict, Aging parent  Barriers:  None   Legal History: Pending legal issue / charges: The patient has no significant history of legal issues. History of legal issue / charges:  n/a  Medical History/Surgical History: reviewed Past Medical History:  Diagnosis Date   AKI (acute kidney injury) (HCC) 12/22/2015   Anxiety    Diabetes mellitus (HCC)    Essential hypertension    started on lisinopril about 45  days prior to pancreatitis   Obesity    Pancreatitis, acute 12/22/2015   a. 11/2015 - acute severe pancreatitis with shock/sepsis, complicated by acute respiratory failure, transaminitis, anemia of critical illness, acute encephalopathy, AKI (Cr 1.36), protein-calorie malnutrition with severe hypoalbuminemia, & hypokalemia.   Splenic vein thrombosis     Past Surgical History:  Procedure Laterality Date   BACK SURGERY     ERCP N/A 01/31/2016   Procedure: ENDOSCOPIC RETROGRADE CHOLANGIOPANCREATOGRAPHY (ERCP);  Surgeon: Jeani Hawking, MD;  Location[facility] ENDOSCOPY;  Service: Endoscopy;  Laterality: N/A;    Medications: Current Outpatient Medications  Medication Sig Dispense Refill   acetaminophen (TYLENOL) 325 MG tablet Take 650 mg by mouth every 6 (six) hours as needed for fever.     albuterol (PROVENTIL HFA;VENTOLIN HFA) 108 (90 Base) MCG/ACT inhaler Inhale 1-2 puffs into the lungs every 6 (six) hours as needed for wheezing or shortness of breath. 1 Inhaler 0   blood glucose meter kit and supplies Dispense based on patient and insurance preference. Use up to four times daily as directed. (FOR ICD-9 250.00, 250.01). 1 each 0   carvedilol (COREG) 25 MG tablet Take 1 tablet (25 mg total) by mouth 2 (two) times daily. 180 tablet 3   cetirizine (ZYRTEC) 10 MG tablet Take 10 mg by mouth at bedtime.     fluticasone (FLONASE) 50 MCG/ACT nasal spray Place 1 spray into both nostrils at bedtime.     guaiFENesin-codeine 100-10 MG/5ML syrup Take 5 mLs by mouth at bedtime as needed for cough. 120 mL 0   HYDROmorphone (DILAUDID) 1 MG/ML injection Inject 1 mL (1 mg total) into the vein every 4 (four) hours as needed for severe pain. 1 mL 0   imipenem-cilastatin 500 mg in sodium chloride 0.9 % 100 mL Inject 500 mg into the vein every 6 (six) hours.     insulin aspart (NOVOLOG) 100 UNIT/ML FlexPen 8 units with meals (as long as eating 50%) PLUS the following scale: CBG 70 - 120: 0 units CBG 121 - 150: 2 units CBG  151 - 200: 3 units CBG 201 - 250: 5 units CBG 251 - 300: 8 units CBG 301 - 350: 11 units CBG 351 - 400: 15 units (Patient taking differently: Inject 8 Units into the skin 3 (three) times daily with meals. 8 units with meals (as long as eating 50%) PLUS the following scale: CBG 70 - 120: 0 units CBG 121 - 150: 2 units CBG 151 - 200: 3 units CBG 201 - 250: 5 units CBG 251 - 300: 8 units CBG 301 - 350: 11 units CBG 351 - 400: 15 units) 60 mL 11   Insulin Glargine (LANTUS SOLOSTAR) 100 UNIT/ML Solostar Pen Inject 25 Units into the skin daily at 10 pm. 15 mL 11   Insulin Pen Needle 31G X 5 MM MISC For insulin injections 100 each 1   pantoprazole (PROTONIX) 40 MG tablet Take 1 tablet (40 mg total) by mouth at bedtime. 30 tablet 0   No current facility-administered medications for this visit.    No Known Allergies  Individualized Treatment Plan                Strengths: verbal, bright, friendly  Supports: wife, daughter, parents   Goal/Needs for Treatment:  In order of importance to patient 1)  Resolve conflicted feelings, takes steps to change work situation and adapt to the new life circumstances.   2) Learn and implement coping skills that result in a reduction of anxiety and worry, and improved daily functioning.  3) Increase activities that reinforce a positive self-identity.  4) Learn and implement coping skills that result in a prevention of of relapse of depression and improve daily functioning.    Client Statement of Needs: work on pervasive issues with anxiety, assistance in making a big life change as he contemplates retiring from current position and figures out new direction   Treatment Level: Individual Biweekly Outpatient Psychotherapy  Symptoms: Autonomic hyperactivity (e.g., palpitations, shortness of breath, dry mouth, trouble swallowing, nausea, diarrhea).  Periodic periods of depressed or irritable mood.  Excessive and/or unrealistic worry that is difficult  to control occurring more days than not for at least 6 months about a number of events or activities.  Feelings of hopelessness, worthlessness, or inappropriate guilt.  Hypervigilance (e.g., feeling constantly on edge, experiencing concentration difficulties, having trouble falling or staying asleep, exhibiting a general state of irritability).  Low self-esteem.  Behavioral passivity, difficulties initiating action Motor tension (e.g., restlessness, tiredness, shakiness, muscle tension).  Restlessness and feelings of lost identity and meaning due to retirement.   Client Treatment Preferences: continue with current therapist   Healthcare consumer's goal for treatment:  Psychologist, Hilma Favors, Ph.D. will support the patient's ability to achieve the goals identified. Cognitive Behavioral Therapy, Dialectical Behavioral Therapy, Motivational Interviewing, Behavior Activation, parent skills, and other evidenced-based practices will be used to promote progress towards healthy functioning.   Healthcare consumer Timothy [name] will: Actively participate in therapy, working towards healthy functioning.    *Justification for Continuation/Discontinuation of Goal: R=Revised, O=Ongoing, A=Achieved, D=Discontinued  Goal 1) Resolve conflicted feelings, takes steps to change work situation and adapt to the new life circumstances.   5 Point Likert rating baseline date: 02/22/2021 Target Date Goal Was reviewed Status Code Progress towards goal/Likert rating  03/20/2023 03/07/2022          O 2/5 - pt has taken initial steps but at times falls into passivity and has made little progress  03/19/2024 03/20/2023          O 4/5 - pt has taken steps to move forward with early retirement, but now is challenged w/ a denial & the p/ begins again        Goal 2) Learn and implement coping skills that result in a reduction of anxiety and worry, and improved daily functioning.  5 Point Likert rating baseline date:  02/23/2015 Target Date Goal Was reviewed Status Code Progress towards goal/Likert rating  03/20/2023 03/07/2022          O 3/5 - pt has learned coping skills and uses them appropriately but inconsistently  03/19/2024 03/20/2023          O 3.5 /5 - pt is better at identifying when he is anxious & more consistently uses his skills        Goal 3) Increase activities that reinforce a positive self-identity.   5 Point Likert rating baseline date: 02/23/2015 Target Date Goal Was reviewed Status Code Progress towards goal/Likert rating  03/20/2023 03/07/2022  O 2/5 - pt has taken initial steps to introduce new activities that have improved his feelings of positive self esteem  03/19/2024 03/20/2023          O 2.5/5 - pt has not taken any new steps but has recognized the need to do more & is making plans        Goal 4) Learn and implement coping skills that result in a prevention of of relapse of depression and improve  daily functioning.   5  Point Likert rating baseline date: 02/23/2015 Target Date Goal Was reviewed Status Code Progress towards goal/Likert rating   03/20/2023  03/07/2022            O  4/5 - pt has learned coping skills and uses them appropriately but inconsistently   03/19/2024  03/20/2023            O  3/5 - pt has regressed & has slipped back into old patterns which has effected his mood & fnxg         This plan has been reviewed and created by the following participants:  This plan will be reviewed at least every 12 months. Date Behavioral Health Clinician Date Guardian/Patient   03/07/2022  Hilma Favors, Ph.D.   03/07/2022 Timothy [name]  03/20/2023  Hilma Favors, Ph.D.  03/20/2023 Timothy [name]               Diagnoses:  Generalized Anxiety Disorder Phase of Life Change - Adult  In session today, conducted pt's annual review.  We reviewed Jassen's progress, d/ goals and updated her treatment plan.  Kollyn  actively participated in the creation of his treatment  plan and freely gave his consent.  We also picked up with recent conflicts which have significantly increased  Jasiah's anxiety.  Fardin states that he reached out to a couple of people to get information but it wasn't fruitful.  I encouraged him to reach out directly to HR/Retirement Counselor to get the facts he needs to move forward.  As we continued to d/p, it was clear that his anxiety and resulting inertia were keeping him from taking the steps he needs to take to move forward.  I provide the support and guidance needed.  By the end of the session he had an actionable plan with which to move forward.   Hilma Favors, PhD

## 2023-04-04 ENCOUNTER — Ambulatory Visit (INDEPENDENT_AMBULATORY_CARE_PROVIDER_SITE_OTHER): Payer: Federal, State, Local not specified - PPO | Admitting: Psychology

## 2023-04-04 DIAGNOSIS — Z600 Problems of adjustment to life-cycle transitions: Secondary | ICD-10-CM

## 2023-04-04 DIAGNOSIS — F411 Generalized anxiety disorder: Secondary | ICD-10-CM | POA: Diagnosis not present

## 2023-04-04 NOTE — Progress Notes (Signed)
PROGRESS NOTE:  Name: Timothy [name] Date: 04/04/2023 MRN: [id number] DOB: June 25, 1976 PCP: Clayborn Heron, MD  Time spent: 8:0AM - 8:59 AM  Annual Review: 03/19/2024  Today I met with  [name] in remote video (Caregility) face-to-face individual psychotherapy.  Distance Site: Client's Home Orginating Site: Dr Odette Horns Remote Office Consent: Obtained verbal consent to transmit session remotely.   Patient is aware of the limitations related to conducting virtual therapy.    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  Since the start of treatment, depression has resolved with periodic mild reoccurrences.  Anxiety however, has proven to be chronic and effects all areas of his life.  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  Unfortunately, he application for early retirement was denied and has created a new series of questions about his future work path.  Therefore, we have continued the process of discerning next steps in his career and professional development.  Timothy [name] mother is aging and he is beginning to see the signs of the need for additional support and negotiating way to provide the needed support and guidance.   Mental Status Exam: Appearance:   Casual and Fairly Groomed     Behavior:  Appropriate  Motor:  Normal  Speech/Language:   NA  Affect:  Appropriate  Mood:  anxious and irritable  Thought process:  normal  Thought content:    WNL  Sensory/Perceptual disturbances:    WNL  Orientation:  oriented to person, place, time/date, and situation  Attention:  Good  Concentration:  Good  Memory:  WNL  Fund of knowledge:   Good  Insight:    Good  Judgment:   Good  Impulse Control:  Good   Risk Assessment: Danger to Self:  No Self-injurious Behavior: No Danger to Others:  No Duty to Warn:no Physical Aggression / Violence:No  Access to Firearms a concern: No   Substance Abuse History: Current substance abuse: No     Past Psychiatric History:   Previous psychological history is significant for anxiety and depression Outpatient Providers: current therapist History of Psych Hospitalization: No  Psychological Testing:  n/a    Abuse History:  Victim of: No.,  n/a    Report needed: No. Victim of Neglect:No. 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  If a parent, number of children / ages: [name] (14) they/them  Support Systems:  Spouse Friends Mother and Father-In-Law Daughter  Surveyor, quantity Stress:  Yes   Income/Employment/Disability: Employment  Financial planner: No   Educational History: Education: Risk manager: N/a  Any cultural differences that may affect / interfere with treatment:  not applicable   Recreation/Hobbies: D&D, movies  Stressors: Family Conflict, Aging parent  Barriers:  None   Legal History: Pending legal issue / charges: The patient has no significant history of legal issues. History of legal issue / charges:  n/a  Medical History/Surgical History: reviewed Past Medical History:  Diagnosis Date   AKI (acute kidney injury) (HCC) 12/22/2015   Anxiety    Diabetes mellitus (HCC)    Essential hypertension    started on lisinopril about 45  days prior to pancreatitis   Obesity    Pancreatitis, acute 12/22/2015   a. 11/2015 - acute severe pancreatitis with shock/sepsis, complicated by acute respiratory failure, transaminitis, anemia of critical illness, acute encephalopathy, AKI (Cr 1.36), protein-calorie malnutrition with severe hypoalbuminemia, & hypokalemia.   Splenic vein thrombosis     Past Surgical History:  Procedure Laterality Date   BACK SURGERY     ERCP N/A 01/31/2016   Procedure: ENDOSCOPIC RETROGRADE CHOLANGIOPANCREATOGRAPHY (ERCP);  Surgeon: Jeani Hawking, MD;  [facility] END[facility]COPY;  Service: Endoscopy;  Laterality: N/A;    Medications: Current Outpatient Medications  Medication Sig Dispense Refill   acetaminophen (TYLENOL) 325 MG tablet Take 650 mg by mouth every 6 (six) hours as needed for fever.     albuterol (PROVENTIL HFA;VENTOLIN HFA) 108 (90 Base) MCG/ACT inhaler Inhale 1-2 puffs into the lungs every 6 (six) hours as needed for wheezing or shortness of breath. 1 Inhaler 0   blood glucose meter kit and supplies Dispense based on patient and insurance preference. Use up to four times daily as directed. (FOR ICD-9 250.00, 250.01). 1 each 0   carvedilol (COREG) 25 MG tablet Take 1 tablet (25 mg total) by mouth 2 (two) times daily. 180 tablet 3   cetirizine (ZYRTEC) 10 MG tablet Take 10 mg by mouth at bedtime.     fluticasone (FLONASE) 50 MCG/ACT nasal spray Place 1 spray into both nostrils at bedtime.     guaiFENesin-codeine 100-10 MG/5ML syrup Take 5 mLs by mouth at bedtime as needed for cough. 120 mL 0   HYDROmorphone (DILAUDID) 1 MG/ML injection Inject 1 mL (1 mg total) into the vein every 4 (four) hours as needed for severe pain. 1 mL 0   imipenem-cilastatin 500 mg in sodium chloride 0.9 % 100 mL Inject 500 mg into the vein every 6 (six) hours.     insulin aspart (NOVOLOG) 100 UNIT/ML FlexPen 8 units with meals (as long as eating 50%) PLUS the following scale: CBG 70 - 120: 0 units CBG 121 - 150: 2 units CBG  151 - 200: 3 units CBG 201 - 250: 5 units CBG 251 - 300: 8 units CBG 301 - 350: 11 units CBG 351 - 400: 15 units (Patient taking differently: Inject 8 Units into the skin 3 (three) times daily with meals. 8 units with meals (as long as eating 50%) PLUS the following scale: CBG 70 - 120: 0 units CBG 121 - 150: 2 units CBG 151 - 200: 3 units CBG 201 - 250: 5 units CBG 251 - 300: 8 units CBG 301 - 350: 11 units CBG 351 - 400: 15 units) 60 mL 11   Insulin Glargine (LANTUS SOLOSTAR) 100 UNIT/ML Solostar Pen Inject 25 Units into the skin daily at 10 pm. 15 mL 11   Insulin Pen Needle 31G X 5 MM MISC For insulin injections 100 each 1   pantoprazole (PROTONIX) 40 MG tablet Take 1 tablet (40 mg total) by mouth at bedtime. 30 tablet 0   No current facility-administered medications for this visit.    No Known Allergies  Individualized Treatment Plan                Strengths: verbal, bright, friendly  Supports: wife, daughter, parents   Goal/Needs for Treatment:  In order of importance to patient 1)  Resolve conflicted feelings, takes steps to change work situation and adapt to the new life circumstances.   2) Learn and implement coping skills that result in a reduction of anxiety and worry, and improved daily functioning.  3) Increase activities that reinforce a positive self-identity.  4) Learn and implement coping skills that result in a prevention of of relapse of depression and improve daily functioning.    Client Statement of Needs: work on pervasive issues with anxiety, assistance in making a big life change as he contemplates retiring from current position and figures out new direction   Treatment Level: Individual Biweekly Outpatient Psychotherapy  Symptoms: Autonomic hyperactivity (e.g., palpitations, shortness of breath, dry mouth, trouble swallowing, nausea, diarrhea).  Periodic periods of depressed or irritable mood.  Excessive and/or unrealistic worry that is difficult  to control occurring more days than not for at least 6 months about a number of events or activities.  Feelings of hopelessness, worthlessness, or inappropriate guilt.  Hypervigilance (e.g., feeling constantly on edge, experiencing concentration difficulties, having trouble falling or staying asleep, exhibiting a general state of irritability).  Low self-esteem.  Behavioral passivity, difficulties initiating action Motor tension (e.g., restlessness, tiredness, shakiness, muscle tension).  Restlessness and feelings of lost identity and meaning due to retirement.   Client Treatment Preferences: continue with current therapist   Healthcare consumer's goal for treatment:  Psychologist, Hilma Favors, Ph.D. will support the patient's ability to achieve the goals identified. Cognitive Behavioral Therapy, Dialectical Behavioral Therapy, Motivational Interviewing, Behavior Activation, parent skills, and other evidenced-based practices will be used to promote progress towards healthy functioning.   Healthcare consumer Timothy [name] will: Actively participate in therapy, working towards healthy functioning.    *Justification for Continuation/Discontinuation of Goal: R=Revised, O=Ongoing, A=Achieved, D=Discontinued  Goal 1) Resolve conflicted feelings, takes steps to change work situation and adapt to the new life circumstances.   5 Point Likert rating baseline date: 02/22/2021 Target Date Goal Was reviewed Status Code Progress towards goal/Likert rating  03/20/2023 03/07/2022          O 2/5 - pt has taken initial steps but at times falls into passivity and has made little progress  03/19/2024 03/20/2023          O 4/5 - pt has taken steps to move forward with early retirement, but now is challenged w/ a denial & the p/ begins again        Goal 2) Learn and implement coping skills that result in a reduction of anxiety and worry, and improved daily functioning.  5 Point Likert rating baseline date:  02/23/2015 Target Date Goal Was reviewed Status Code Progress towards goal/Likert rating  03/20/2023 03/07/2022          O 3/5 - pt has learned coping skills and uses them appropriately but inconsistently  03/19/2024 03/20/2023          O 3.5 /5 - pt is better at identifying when he is anxious & more consistently uses his skills        Goal 3) Increase activities that reinforce a positive self-identity.   5 Point Likert rating baseline date: 02/23/2015 Target Date Goal Was reviewed Status Code Progress towards goal/Likert rating  03/20/2023 03/07/2022  O 2/5 - pt has taken initial steps to introduce new activities that have improved his feelings of positive self esteem  03/19/2024 03/20/2023          O 2.5/5 - pt has not taken any new steps but has recognized the need to do more & is making plans        Goal 4) Learn and implement coping skills that result in a prevention of of relapse of depression and improve  daily functioning.   5  Point Likert rating baseline date: 02/23/2015 Target Date Goal Was reviewed Status Code Progress towards goal/Likert rating   03/20/2023  03/07/2022            O  4/5 - pt has learned coping skills and uses them appropriately but inconsistently   03/19/2024  03/20/2023            O  3/5 - pt has regressed & has slipped back into old patterns which has effected his mood & fnxg         This plan has been reviewed and created by the following participants:  This plan will be reviewed at least every 12 months. Date Behavioral Health Clinician Date Guardian/Patient   03/07/2022  Hilma Favors, Ph.D.   03/07/2022 [name]  03/20/2023  Hilma Favors, Ph.D.  03/20/2023 [name]               Diagnoses:  Generalized Anxiety Disorder Phase of Life Change - Adult  Timothy [name] reports that he f/t and made inquires related to the denial of his early-out application.  We d/e/p the information he acquired, the implications for what options are open to him  and next steps.    Timothy [name] states that he asked for a painting kit for his birthday.  We d/p that there is a need for some alternative activities instead of spending time on his phone.  I noted the benefits of his having a creative outlet, provided guidance and support as needed.   Hilma Favors, PhD

## 2023-04-17 ENCOUNTER — Ambulatory Visit (INDEPENDENT_AMBULATORY_CARE_PROVIDER_SITE_OTHER): Payer: Federal, State, Local not specified - PPO | Admitting: Psychology

## 2023-04-17 DIAGNOSIS — Z600 Problems of adjustment to life-cycle transitions: Secondary | ICD-10-CM

## 2023-04-17 DIAGNOSIS — F411 Generalized anxiety disorder: Secondary | ICD-10-CM

## 2023-04-17 NOTE — Progress Notes (Signed)
PROGRESS NOTE:  Name: Timothy [name] Date: 04/17/2023 MRN: [id number] DOB: 1975-10-18 PCP: Clayborn Heron, MD  Time spent: 8:00 AM - 8:58 AM  Annual Review: 03/19/2024  Today I met with  Timothy [name] in remote video (Caregility) face-to-face individual psychotherapy.  Distance Site: Client's Home Orginating Site: Dr Odette Horns Remote Office Consent: Obtained verbal consent to transmit session remotely.   Patient is aware of the limitations related to conducting virtual therapy.    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  Since the start of treatment, depression has resolved with periodic mild reoccurrences.  Anxiety however, has proven to be chronic and effects all areas of his life.  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  Unfortunately, he application for early retirement was denied and has created a new series of questions about his future work path.  Therefore, we have continued the process of discerning next steps in his career and professional development.  Timothy [name] is aging and he is beginning to see the signs of the need for additional support and negotiating way to provide the needed support and guidance.   Mental Status Exam: Appearance:   Casual and Fairly Groomed     Behavior:  Appropriate  Motor:  Normal  Speech/Language:   NA  Affect:  Appropriate  Mood:  anxious and irritable  Thought process:  normal  Thought content:    WNL  Sensory/Perceptual disturbances:    WNL  Orientation:  oriented to person, place, time/date, and situation  Attention:  Good  Concentration:  Good  Memory:  WNL  Fund of knowledge:   Good  Insight:    Good  Judgment:   Good  Impulse Control:  Good   Risk Assessment: Danger to Self:  No Self-injurious Behavior: No Danger to Others:  No Duty to Warn:no Physical Aggression / Violence:No  Access to Firearms a concern: No   Substance Abuse History: Current substance abuse: No     Past Psychiatric History:   Previous psychological history is significant for anxiety and depression Outpatient Providers: current therapist History of Psych Hospitalization: No  Psychological Testing:  n/a    Abuse History:  Victim of: No.,  n/a    Report needed: No. Victim of Neglect:No. 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  If a parent, number of children / ages: Timothy [name] they/them  Support Systems:  Spouse [name] and Father-In-Law Daughter  Surveyor, quantity Stress:  Yes   Income/Employment/Disability: Employment  Financial planner: No   Educational History: Education: Risk manager: N/a  Any cultural differences that may affect / interfere with treatment:  not applicable   Recreation/Hobbies: D&D, movies  Stressors: Family Conflict, Aging parent  Barriers:  None   Legal History: Pending legal issue / charges: The patient has no significant history of legal issues. History of legal issue / charges:  n/a  Medical History/Surgical History: reviewed Past Medical History:  Diagnosis Date   AKI (acute kidney injury) (HCC) 12/22/2015   Anxiety    Diabetes mellitus (HCC)    Essential hypertension    started on lisinopril about 45  days prior to pancreatitis   Obesity    Pancreatitis, acute 12/22/2015   a. 11/2015 - acute severe pancreatitis with shock/sepsis, complicated by acute respiratory failure, transaminitis, anemia of critical illness, acute encephalopathy, AKI (Cr 1.36), protein-calorie malnutrition with severe hypoalbuminemia, & hypokalemia.   Splenic vein thrombosis     Past Surgical History:  Procedure Laterality Date   BACK SURGERY     ERCP N/A 01/31/2016   Procedure: ENDOSCOPIC RETROGRADE CHOLANGIOPANCREATOGRAPHY (ERCP);  Surgeon: Jeani Hawking, MD;  Location: [facility] ENDOSCOPY;  Service: Endoscopy;  Laterality: N/A;    Medications: Current Outpatient Medications  Medication Sig Dispense Refill   acetaminophen (TYLENOL) 325 MG tablet Take 650 mg by mouth every 6 (six) hours as needed for fever.     albuterol (PROVENTIL HFA;VENTOLIN HFA) 108 (90 Base) MCG/ACT inhaler Inhale 1-2 puffs into the lungs every 6 (six) hours as needed for wheezing or shortness of breath. 1 Inhaler 0   blood glucose meter kit and supplies Dispense based on patient and insurance preference. Use up to four times daily as directed. (FOR ICD-9 250.00, 250.01). 1 each 0   carvedilol (COREG) 25 MG tablet Take 1 tablet (25 mg total) by mouth 2 (two) times daily. 180 tablet 3   cetirizine (ZYRTEC) 10 MG tablet Take 10 mg by mouth at bedtime.     fluticasone (FLONASE) 50 MCG/ACT nasal spray Place 1 spray into both nostrils at bedtime.     guaiFENesin-codeine 100-10 MG/5ML syrup Take 5 mLs by mouth at bedtime as needed for cough. 120 mL 0   HYDROmorphone (DILAUDID) 1 MG/ML injection Inject 1 mL (1 mg total) into the vein every 4 (four) hours as needed for severe pain. 1 mL 0   imipenem-cilastatin 500 mg in sodium chloride 0.9 % 100 mL Inject 500 mg into the vein every 6 (six) hours.     insulin aspart (NOVOLOG) 100 UNIT/ML FlexPen 8 units with meals (as long as eating 50%) PLUS the following scale: CBG 70 - 120: 0 units CBG 121 - 150: 2 units CBG  151 - 200: 3 units CBG 201 - 250: 5 units CBG 251 - 300: 8 units CBG 301 - 350: 11 units CBG 351 - 400: 15 units (Patient taking differently: Inject 8 Units into the skin 3 (three) times daily with meals. 8 units with meals (as long as eating 50%) PLUS the following scale: CBG 70 - 120: 0 units CBG 121 - 150: 2 units CBG 151 - 200: 3 units CBG 201 - 250: 5 units CBG 251 - 300: 8 units CBG 301 - 350: 11 units CBG 351 - 400: 15 units) 60 mL 11   Insulin Glargine (LANTUS SOLOSTAR) 100 UNIT/ML Solostar Pen Inject 25 Units into the skin daily at 10 pm. 15 mL 11   Insulin Pen Needle 31G X 5 MM MISC For insulin injections 100 each 1   pantoprazole (PROTONIX) 40 MG tablet Take 1 tablet (40 mg total) by mouth at bedtime. 30 tablet 0   No current facility-administered medications for this visit.    No Known Allergies  Individualized Treatment Plan                Strengths: verbal, bright, friendly  Supports: wife, daughter, parents   Goal/Needs for Treatment:  In order of importance to patient 1)  Resolve conflicted feelings, takes steps to change work situation and adapt to the new life circumstances.   2) Learn and implement coping skills that result in a reduction of anxiety and worry, and improved daily functioning.  3) Increase activities that reinforce a positive self-identity.  4) Learn and implement coping skills that result in a prevention of of relapse of depression and improve daily functioning.    Client Statement of Needs: work on pervasive issues with anxiety, assistance in making a big life change as he contemplates retiring from current position and figures out new direction   Treatment Level: Individual Biweekly Outpatient Psychotherapy  Symptoms: Autonomic hyperactivity (e.g., palpitations, shortness of breath, dry mouth, trouble swallowing, nausea, diarrhea).  Periodic periods of depressed or irritable mood.  Excessive and/or unrealistic worry that is difficult  to control occurring more days than not for at least 6 months about a number of events or activities.  Feelings of hopelessness, worthlessness, or inappropriate guilt.  Hypervigilance (e.g., feeling constantly on edge, experiencing concentration difficulties, having trouble falling or staying asleep, exhibiting a general state of irritability).  Low self-esteem.  Behavioral passivity, difficulties initiating action Motor tension (e.g., restlessness, tiredness, shakiness, muscle tension).  Restlessness and feelings of lost identity and meaning due to retirement.   Client Treatment Preferences: continue with current therapist   Healthcare consumer's goal for treatment:  Psychologist, Hilma Favors, Ph.D. will support the patient's ability to achieve the goals identified. Cognitive Behavioral Therapy, Dialectical Behavioral Therapy, Motivational Interviewing, Behavior Activation, parent skills, and other evidenced-based practices will be used to promote progress towards healthy functioning.   Healthcare consumer Timothy [name] will: Actively participate in therapy, working towards healthy functioning.    *Justification for Continuation/Discontinuation of Goal: R=Revised, O=Ongoing, A=Achieved, D=Discontinued  Goal 1) Resolve conflicted feelings, takes steps to change work situation and adapt to the new life circumstances.   5 Point Likert rating baseline date: 02/22/2021 Target Date Goal Was reviewed Status Code Progress towards goal/Likert rating  03/20/2023 03/07/2022          O 2/5 - pt has taken initial steps but at times falls into passivity and has made little progress  03/19/2024 03/20/2023          O 4/5 - pt has taken steps to move forward with early retirement, but now is challenged w/ a denial & the p/ begins again        Goal 2) Learn and implement coping skills that result in a reduction of anxiety and worry, and improved daily functioning.  5 Point Likert rating baseline date:  02/23/2015 Target Date Goal Was reviewed Status Code Progress towards goal/Likert rating  03/20/2023 03/07/2022          O 3/5 - pt has learned coping skills and uses them appropriately but inconsistently  03/19/2024 03/20/2023          O 3.5 /5 - pt is better at identifying when he is anxious & more consistently uses his skills        Goal 3) Increase activities that reinforce a positive self-identity.   5 Point Likert rating baseline date: 02/23/2015 Target Date Goal Was reviewed Status Code Progress towards goal/Likert rating  03/20/2023 03/07/2022  O 2/5 - pt has taken initial steps to introduce new activities that have improved his feelings of positive self esteem  03/19/2024 03/20/2023          O 2.5/5 - pt has not taken any new steps but has recognized the need to do more & is making plans        Goal 4) Learn and implement coping skills that result in a prevention of of relapse of depression and improve  daily functioning.   5  Point Likert rating baseline date: 02/23/2015 Target Date Goal Was reviewed Status Code Progress towards goal/Likert rating   03/20/2023  03/07/2022            O  4/5 - pt has learned coping skills and uses them appropriately but inconsistently   03/19/2024  03/20/2023            O  3/5 - pt has regressed & has slipped back into old patterns which has effected his mood & fnxg         This plan has been reviewed and created by the following participants:  This plan will be reviewed at least every 12 months. Date Behavioral Health Clinician Date Guardian/Patient   03/07/2022  Hilma Favors, Ph.D.   03/07/2022 Timothy [name]  03/20/2023  Hilma Favors, Ph.D.  03/20/2023 Timothy [name]               Diagnoses:  Generalized Anxiety Disorder Phase of Life Change - Adult  Timothy [name] reports that he got suspended from work for two days without pay.  He has resigned himself to taking a demotion since he wasn't allowed to retire early.  We d/e/p his thought  process, how he felt and how he responded.  We d/ next steps and made connections between the past and present.  I provided the support and guidance he needed.   Hilma Favors, PhD

## 2023-05-01 ENCOUNTER — Ambulatory Visit (INDEPENDENT_AMBULATORY_CARE_PROVIDER_SITE_OTHER): Payer: Federal, State, Local not specified - PPO | Admitting: Psychology

## 2023-05-01 DIAGNOSIS — Z729 Problem related to lifestyle, unspecified: Secondary | ICD-10-CM

## 2023-05-01 DIAGNOSIS — F411 Generalized anxiety disorder: Secondary | ICD-10-CM

## 2023-05-01 NOTE — Progress Notes (Signed)
PROGRESS NOTE:  Name: Timothy [name] Date: 05/01/2023 MRN: [id number] DOB: 1976-01-08 PCP: Clayborn Heron, MD  Time spent: 8:00 AM - 8:58 AM  Annual Review: 03/19/2024  Today I met with  Timothy [name] in remote video (Caregility) face-to-face individual psychotherapy.  Distance Site: Client's Home Orginating Site: Dr Odette Horns Remote Office Consent: Obtained verbal consent to transmit session remotely.   Patient is aware of the limitations related to conducting virtual therapy.    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  Since the start of treatment, depression has resolved with periodic mild reoccurrences.  Anxiety however, has proven to be chronic and effects all areas of his life.  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  Unfortunately, he application for early retirement was denied and has created a new series of questions about his future work path.  Therefore, we have continued the process of discerning next steps in his career and professional development.  Timothy [name]'s mother is aging and he is beginning to see the signs of the need for additional support and negotiating way to provide the needed support and guidance.   Mental Status Exam: Appearance:   Casual and Fairly Groomed     Behavior:  Appropriate  Motor:  Normal  Speech/Language:   NA  Affect:  Appropriate  Mood:  anxious and irritable  Thought process:  normal  Thought content:    WNL  Sensory/Perceptual disturbances:    WNL  Orientation:  oriented to person, place, time/date, and situation  Attention:  Good  Concentration:  Good  Memory:  WNL  Fund of knowledge:   Good  Insight:    Good  Judgment:   Good  Impulse Control:  Good   Risk Assessment: Danger to Self:  No Self-injurious Behavior: No Danger to Others:  No Duty to Warn:no Physical Aggression / Violence:No  Access to Firearms a concern: No   Substance Abuse History: Current substance abuse: No     Past Psychiatric History:   Previous psychological history is significant for anxiety and depression Outpatient Providers: current therapist History of Psych Hospitalization: No  Psychological Testing:  n/a    Abuse History:  Victim of: No.,  n/a    Report needed: No. Victim of Neglect:No. 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  If a parent, number of children / ages: Timothy [name] (73) they/them  Support Systems:  Spouse Friends Mother and Father-In-Law Daughter  Surveyor, quantity Stress:  Yes   Income/Employment/Disability: Employment  Financial planner: No   Educational History: Education: Risk manager: N/a  Any cultural differences that may affect / interfere with treatment:  not applicable   Recreation/Hobbies: D&D, movies  Stressors: Family Conflict, Aging parent  Barriers:  None   Legal History: Pending legal issue / charges: The patient has no significant history of legal issues. History of legal issue / charges:  n/a  Medical History/Surgical History: reviewed Past Medical History:  Diagnosis Date   AKI (acute kidney injury) (HCC) 12/22/2015   Anxiety    Diabetes mellitus (HCC)    Essential hypertension    started on lisinopril about 45  days prior to pancreatitis   Obesity    Pancreatitis, acute 12/22/2015   a. 11/2015 - acute severe pancreatitis with shock/sepsis, complicated by acute respiratory failure, transaminitis, anemia of critical illness, acute encephalopathy, AKI (Cr 1.36), protein-calorie malnutrition with severe hypoalbuminemia, & hypokalemia.   Splenic vein thrombosis     Past Surgical History:  Procedure Laterality Date   BACK SURGERY     ERCP N/A 01/31/2016   Procedure: ENDOSCOPIC RETROGRADE CHOLANGIOPANCREATOGRAPHY (ERCP);  Surgeon: Jeani Hawking, MD;  Location: [facility] ENDOSCOPY;  Service: Endoscopy;  Laterality: N/A;    Medications: Current Outpatient Medications  Medication Sig Dispense Refill   acetaminophen (TYLENOL) 325 MG tablet Take 650 mg by mouth every 6 (six) hours as needed for fever.     albuterol (PROVENTIL HFA;VENTOLIN HFA) 108 (90 Base) MCG/ACT inhaler Inhale 1-2 puffs into the lungs every 6 (six) hours as needed for wheezing or shortness of breath. 1 Inhaler 0   blood glucose meter kit and supplies Dispense based on patient and insurance preference. Use up to four times daily as directed. (FOR ICD-9 250.00, 250.01). 1 each 0   carvedilol (COREG) 25 MG tablet Take 1 tablet (25 mg total) by mouth 2 (two) times daily. 180 tablet 3   cetirizine (ZYRTEC) 10 MG tablet Take 10 mg by mouth at bedtime.     fluticasone (FLONASE) 50 MCG/ACT nasal spray Place 1 spray into both nostrils at bedtime.     guaiFENesin-codeine 100-10 MG/5ML syrup Take 5 mLs by mouth at bedtime as needed for cough. 120 mL 0   HYDROmorphone (DILAUDID) 1 MG/ML injection Inject 1 mL (1 mg total) into the vein every 4 (four) hours as needed for severe pain. 1 mL 0   imipenem-cilastatin 500 mg in sodium chloride 0.9 % 100 mL Inject 500 mg into the vein every 6 (six) hours.     insulin aspart (NOVOLOG) 100 UNIT/ML FlexPen 8 units with meals (as long as eating 50%) PLUS the following scale: CBG 70 - 120: 0 units CBG 121 - 150: 2 units CBG  151 - 200: 3 units CBG 201 - 250: 5 units CBG 251 - 300: 8 units CBG 301 - 350: 11 units CBG 351 - 400: 15 units (Patient taking differently: Inject 8 Units into the skin 3 (three) times daily with meals. 8 units with meals (as long as eating 50%) PLUS the following scale: CBG 70 - 120: 0 units CBG 121 - 150: 2 units CBG 151 - 200: 3 units CBG 201 - 250: 5 units CBG 251 - 300: 8 units CBG 301 - 350: 11 units CBG 351 - 400: 15 units) 60 mL 11   Insulin Glargine (LANTUS SOLOSTAR) 100 UNIT/ML Solostar Pen Inject 25 Units into the skin daily at 10 pm. 15 mL 11   Insulin Pen Needle 31G X 5 MM MISC For insulin injections 100 each 1   pantoprazole (PROTONIX) 40 MG tablet Take 1 tablet (40 mg total) by mouth at bedtime. 30 tablet 0   No current facility-administered medications for this visit.    No Known Allergies  Individualized Treatment Plan                Strengths: verbal, bright, friendly  Supports: wife, daughter, parents   Goal/Needs for Treatment:  In order of importance to patient 1)  Resolve conflicted feelings, takes steps to change work situation and adapt to the new life circumstances.   2) Learn and implement coping skills that result in a reduction of anxiety and worry, and improved daily functioning.  3) Increase activities that reinforce a positive self-identity.  4) Learn and implement coping skills that result in a prevention of of relapse of depression and improve daily functioning.    Client Statement of Needs: work on pervasive issues with anxiety, assistance in making a big life change as he contemplates retiring from current position and figures out new direction   Treatment Level: Individual Biweekly Outpatient Psychotherapy  Symptoms: Autonomic hyperactivity (e.g., palpitations, shortness of breath, dry mouth, trouble swallowing, nausea, diarrhea).  Periodic periods of depressed or irritable mood.  Excessive and/or unrealistic worry that is difficult  to control occurring more days than not for at least 6 months about a number of events or activities.  Feelings of hopelessness, worthlessness, or inappropriate guilt.  Hypervigilance (e.g., feeling constantly on edge, experiencing concentration difficulties, having trouble falling or staying asleep, exhibiting a general state of irritability).  Low self-esteem.  Behavioral passivity, difficulties initiating action Motor tension (e.g., restlessness, tiredness, shakiness, muscle tension).  Restlessness and feelings of lost identity and meaning due to retirement.   Client Treatment Preferences: continue with current therapist   Healthcare consumer's goal for treatment:  Psychologist, Hilma Favors, Ph.D. will support the patient's ability to achieve the goals identified. Cognitive Behavioral Therapy, Dialectical Behavioral Therapy, Motivational Interviewing, Behavior Activation, parent skills, and other evidenced-based practices will be used to promote progress towards healthy functioning.   Healthcare consumer Timothy [name] will: Actively participate in therapy, working towards healthy functioning.    *Justification for Continuation/Discontinuation of Goal: R=Revised, O=Ongoing, A=Achieved, D=Discontinued  Goal 1) Resolve conflicted feelings, takes steps to change work situation and adapt to the new life circumstances.   5 Point Likert rating baseline date: 02/22/2021 Target Date Goal Was reviewed Status Code Progress towards goal/Likert rating  03/20/2023 03/07/2022          O 2/5 - pt has taken initial steps but at times falls into passivity and has made little progress  03/19/2024 03/20/2023          O 4/5 - pt has taken steps to move forward with early retirement, but now is challenged w/ a denial & the p/ begins again        Goal 2) Learn and implement coping skills that result in a reduction of anxiety and worry, and improved daily functioning.  5 Point Likert rating baseline date:  02/23/2015 Target Date Goal Was reviewed Status Code Progress towards goal/Likert rating  03/20/2023 03/07/2022          O 3/5 - pt has learned coping skills and uses them appropriately but inconsistently  03/19/2024 03/20/2023          O 3.5 /5 - pt is better at identifying when he is anxious & more consistently uses his skills        Goal 3) Increase activities that reinforce a positive self-identity.   5 Point Likert rating baseline date: 02/23/2015 Target Date Goal Was reviewed Status Code Progress towards goal/Likert rating  03/20/2023 03/07/2022  O 2/5 - pt has taken initial steps to introduce new activities that have improved his feelings of positive self esteem  03/19/2024 03/20/2023          O 2.5/5 - pt has not taken any new steps but has recognized the need to do more & is making plans        Goal 4) Learn and implement coping skills that result in a prevention of of relapse of depression and improve  daily functioning.   5  Point Likert rating baseline date: 02/23/2015 Target Date Goal Was reviewed Status Code Progress towards goal/Likert rating   03/20/2023  03/07/2022            O  4/5 - pt has learned coping skills and uses them appropriately but inconsistently   03/19/2024  03/20/2023            O  3/5 - pt has regressed & has slipped back into old patterns which has effected his mood & fnxg         This plan has been reviewed and created by the following participants:  This plan will be reviewed at least every 12 months. Date Behavioral Health Clinician Date Guardian/Patient   03/07/2022  Hilma Favors, Ph.D.   03/07/2022 Timothy [name]  03/20/2023  Hilma Favors, Ph.D.  03/20/2023 Timothy [name]               Diagnoses:  Generalized Anxiety Disorder Phase of Life Change - Adult  Timothy [name] reports that he spoke to his friend and colleague about the process of requesting a voluntary demotion.  We d/e/p his hesitation about when he should submit his paperwork,  addressed his distorted thinking and how to best move forward.  We also d/ a similar but unrelated issue with his family.  Here too we d/ the origins of his initial "freeze" response, pausing to reset/process and execute (learn) a new response.     Hilma Favors, PhD

## 2023-05-15 ENCOUNTER — Ambulatory Visit (INDEPENDENT_AMBULATORY_CARE_PROVIDER_SITE_OTHER): Payer: Federal, State, Local not specified - PPO | Admitting: Psychology

## 2023-05-15 DIAGNOSIS — Z600 Problems of adjustment to life-cycle transitions: Secondary | ICD-10-CM | POA: Diagnosis not present

## 2023-05-15 DIAGNOSIS — F411 Generalized anxiety disorder: Secondary | ICD-10-CM | POA: Diagnosis not present

## 2023-05-15 NOTE — Progress Notes (Signed)
PROGRESS NOTE:  Name: Timothy [name] Date: 05/15/2023 MRN: [id number] DOB: 12/24/1975 PCP: Clayborn Heron, MD  Time spent: 8:01 AM - 8:59 AM  Annual Review: 03/19/2024  Today I met with  [name] in remote video (Caregility) face-to-face individual psychotherapy.  Distance Site: Client's Home Orginating Site: Dr Odette Horns Remote Office Consent: Obtained verbal consent to transmit session remotely.   Patient is aware of the limitations related to conducting virtual therapy.    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  Since the start of treatment, depression has resolved with periodic mild reoccurrences.  Anxiety however, has proven to be chronic and effects all areas of his life.  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  Unfortunately, he application for early retirement was denied and has created a new series of questions about his future work path.  Therefore, we have continued the process of discerning next steps in his career and professional development.  Timothy [name]'s mother is aging and he is beginning to see the signs of the need for additional support and negotiating way to provide the needed support and guidance.   Mental Status Exam: Appearance:   Casual and Fairly Groomed     Behavior:  Appropriate  Motor:  Normal  Speech/Language:   NA  Affect:  Appropriate  Mood:  anxious and irritable  Thought process:  normal  Thought content:    WNL  Sensory/Perceptual disturbances:    WNL  Orientation:  oriented to person, place, time/date, and situation  Attention:  Good  Concentration:  Good  Memory:  WNL  Fund of knowledge:   Good  Insight:    Good  Judgment:   Good  Impulse Control:  Good   Risk Assessment: Danger to Self:  No Self-injurious Behavior: No Danger to Others:  No Duty to Warn:no Physical Aggression / Violence:No  Access to Firearms a concern: No   Substance Abuse History: Current substance abuse: No     Past Psychiatric History:   Previous psychological history is significant for anxiety and depression Outpatient Providers: current therapist History of Psych Hospitalization: No  Psychological Testing:  n/a    Abuse History:  Victim of: No.,  n/a    Report needed: No. Victim of Neglect:No. 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  If a parent, number of children / ages: [name] (76) they/them  Support Systems:  Spouse Friends Mother and Father-In-Law Daughter  Surveyor, quantity Stress:  Yes   Income/Employment/Disability: Employment  Financial planner: No   Educational History: Education: Risk manager: N/a  Any cultural differences that may affect / interfere with treatment:  not applicable   Recreation/Hobbies: D&D, movies  Stressors: Family Conflict, Aging parent  Barriers:  None   Legal History: Pending legal issue / charges: The patient has no significant history of legal issues. History of legal issue / charges:  n/a  Medical History/Surgical History: reviewed Past Medical History:  Diagnosis Date   AKI (acute kidney injury) (HCC) 12/22/2015   Anxiety    Diabetes mellitus (HCC)    Essential hypertension    started on lisinopril about 45  days prior to pancreatitis   Obesity    Pancreatitis, acute 12/22/2015   a. 11/2015 - acute severe pancreatitis with shock/sepsis, complicated by acute respiratory failure, transaminitis, anemia of critical illness, acute encephalopathy, AKI (Cr 1.36), protein-calorie malnutrition with severe hypoalbuminemia, & hypokalemia.   Splenic vein thrombosis     Past Surgical History:  Procedure Laterality Date   BACK SURGERY     ERCP N/A 01/31/2016   Procedure: ENDOSCOPIC RETROGRADE CHOLANGIOPANCREATOGRAPHY (ERCP);  Surgeon: Jeani Hawking, MD;  Location: [facility] ENDOSCOPY;  Service: Endoscopy;  Laterality: N/A;    Medications: Current Outpatient Medications  Medication Sig Dispense Refill   acetaminophen (TYLENOL) 325 MG tablet Take 650 mg by mouth every 6 (six) hours as needed for fever.     albuterol (PROVENTIL HFA;VENTOLIN HFA) 108 (90 Base) MCG/ACT inhaler Inhale 1-2 puffs into the lungs every 6 (six) hours as needed for wheezing or shortness of breath. 1 Inhaler 0   blood glucose meter kit and supplies Dispense based on patient and insurance preference. Use up to four times daily as directed. (FOR ICD-9 250.00, 250.01). 1 each 0   carvedilol (COREG) 25 MG tablet Take 1 tablet (25 mg total) by mouth 2 (two) times daily. 180 tablet 3   cetirizine (ZYRTEC) 10 MG tablet Take 10 mg by mouth at bedtime.     fluticasone (FLONASE) 50 MCG/ACT nasal spray Place 1 spray into both nostrils at bedtime.     guaiFENesin-codeine 100-10 MG/5ML syrup Take 5 mLs by mouth at bedtime as needed for cough. 120 mL 0   HYDROmorphone (DILAUDID) 1 MG/ML injection Inject 1 mL (1 mg total) into the vein every 4 (four) hours as needed for severe pain. 1 mL 0   imipenem-cilastatin 500 mg in sodium chloride 0.9 % 100 mL Inject 500 mg into the vein every 6 (six) hours.     insulin aspart (NOVOLOG) 100 UNIT/ML FlexPen 8 units with meals (as long as eating 50%) PLUS the following scale: CBG 70 - 120: 0 units CBG 121 - 150: 2 units CBG  151 - 200: 3 units CBG 201 - 250: 5 units CBG 251 - 300: 8 units CBG 301 - 350: 11 units CBG 351 - 400: 15 units (Patient taking differently: Inject 8 Units into the skin 3 (three) times daily with meals. 8 units with meals (as long as eating 50%) PLUS the following scale: CBG 70 - 120: 0 units CBG 121 - 150: 2 units CBG 151 - 200: 3 units CBG 201 - 250: 5 units CBG 251 - 300: 8 units CBG 301 - 350: 11 units CBG 351 - 400: 15 units) 60 mL 11   Insulin Glargine (LANTUS SOLOSTAR) 100 UNIT/ML Solostar Pen Inject 25 Units into the skin daily at 10 pm. 15 mL 11   Insulin Pen Needle 31G X 5 MM MISC For insulin injections 100 each 1   pantoprazole (PROTONIX) 40 MG tablet Take 1 tablet (40 mg total) by mouth at bedtime. 30 tablet 0   No current facility-administered medications for this visit.    No Known Allergies  Individualized Treatment Plan                Strengths: verbal, bright, friendly  Supports: wife, daughter, parents   Goal/Needs for Treatment:  In order of importance to patient 1)  Resolve conflicted feelings, takes steps to change work situation and adapt to the new life circumstances.   2) Learn and implement coping skills that result in a reduction of anxiety and worry, and improved daily functioning.  3) Increase activities that reinforce a positive self-identity.  4) Learn and implement coping skills that result in a prevention of of relapse of depression and improve daily functioning.    Client Statement of Needs: work on pervasive issues with anxiety, assistance in making a big life change as he contemplates retiring from current position and figures out new direction   Treatment Level: Individual Biweekly Outpatient Psychotherapy  Symptoms: Autonomic hyperactivity (e.g., palpitations, shortness of breath, dry mouth, trouble swallowing, nausea, diarrhea).  Periodic periods of depressed or irritable mood.  Excessive and/or unrealistic worry that is difficult  to control occurring more days than not for at least 6 months about a number of events or activities.  Feelings of hopelessness, worthlessness, or inappropriate guilt.  Hypervigilance (e.g., feeling constantly on edge, experiencing concentration difficulties, having trouble falling or staying asleep, exhibiting a general state of irritability).  Low self-esteem.  Behavioral passivity, difficulties initiating action Motor tension (e.g., restlessness, tiredness, shakiness, muscle tension).  Restlessness and feelings of lost identity and meaning due to retirement.   Client Treatment Preferences: continue with current therapist   Healthcare consumer's goal for treatment:  Psychologist, Hilma Favors, Ph.D. will support the patient's ability to achieve the goals identified. Cognitive Behavioral Therapy, Dialectical Behavioral Therapy, Motivational Interviewing, Behavior Activation, parent skills, and other evidenced-based practices will be used to promote progress towards healthy functioning.   Healthcare consumer Darrick Greenlaw will: Actively participate in therapy, working towards healthy functioning.    *Justification for Continuation/Discontinuation of Goal: R=Revised, O=Ongoing, A=Achieved, D=Discontinued  Goal 1) Resolve conflicted feelings, takes steps to change work situation and adapt to the new life circumstances.   5 Point Likert rating baseline date: 02/22/2021 Target Date Goal Was reviewed Status Code Progress towards goal/Likert rating  03/20/2023 03/07/2022          O 2/5 - pt has taken initial steps but at times falls into passivity and has made little progress  03/19/2024 03/20/2023          O 4/5 - pt has taken steps to move forward with early retirement, but now is challenged w/ a denial & the p/ begins again        Goal 2) Learn and implement coping skills that result in a reduction of anxiety and worry, and improved daily functioning.  5 Point Likert rating baseline date:  02/23/2015 Target Date Goal Was reviewed Status Code Progress towards goal/Likert rating  03/20/2023 03/07/2022          O 3/5 - pt has learned coping skills and uses them appropriately but inconsistently  03/19/2024 03/20/2023          O 3.5 /5 - pt is better at identifying when he is anxious & more consistently uses his skills        Goal 3) Increase activities that reinforce a positive self-identity.   5 Point Likert rating baseline date: 02/23/2015 Target Date Goal Was reviewed Status Code Progress towards goal/Likert rating  03/20/2023 03/07/2022  O 2/5 - pt has taken initial steps to introduce new activities that have improved his feelings of positive self esteem  03/19/2024 03/20/2023          O 2.5/5 - pt has not taken any new steps but has recognized the need to do more & is making plans        Goal 4) Learn and implement coping skills that result in a prevention of of relapse of depression and improve  daily functioning.   5  Point Likert rating baseline date: 02/23/2015 Target Date Goal Was reviewed Status Code Progress towards goal/Likert rating   03/20/2023  03/07/2022            O  4/5 - pt has learned coping skills and uses them appropriately but inconsistently   03/19/2024  03/20/2023            O  3/5 - pt has regressed & has slipped back into old patterns which has effected his mood & fnxg         This plan has been reviewed and created by the following participants:  This plan will be reviewed at least every 12 months. Date Behavioral Health Clinician Date Guardian/Patient   03/07/2022  Hilma Favors, Ph.D.   03/07/2022 [name]  03/20/2023  Hilma Favors, Ph.D.  03/20/2023 [name]               Diagnoses:  Generalized Anxiety Disorder Phase of Life Change - Adult  Timothy [name] reports that he continues feel stressed at work and is still considering  requesting a demotion, but has not made any progress in taking steps to make this happen.  We d/e/p where  he feels stuck, identified his anxious thoughts, the need for him to challenge these thoughts and how to move forward successfully.    Lastly, we d/p family issues around his wife's return to the work force.  We were able to identify ;and refocus his attention on mot only the negatives, but also the gain/positives that are involved in this life change.  Hilma Favors, PhD

## 2023-05-29 ENCOUNTER — Ambulatory Visit: Payer: Federal, State, Local not specified - PPO | Admitting: Psychology

## 2023-06-12 ENCOUNTER — Ambulatory Visit: Payer: Federal, State, Local not specified - PPO | Admitting: Psychology

## 2023-06-26 ENCOUNTER — Ambulatory Visit: Payer: Federal, State, Local not specified - PPO | Admitting: Psychology

## 2023-06-26 DIAGNOSIS — F411 Generalized anxiety disorder: Secondary | ICD-10-CM | POA: Diagnosis not present

## 2023-06-26 DIAGNOSIS — Z600 Problems of adjustment to life-cycle transitions: Secondary | ICD-10-CM

## 2023-06-26 NOTE — Progress Notes (Signed)
PROGRESS NOTE:  Name: Timothy [name] Date: 06/26/2023 MRN: [id number] DOB: [birth year] PCP: Clayborn Heron, MD  Time spent: 8:01 AM - 8:58 AM  Annual Review: 03/19/2024  Today I met with  Timothy [name] in remote video (Caregility) face-to-face individual psychotherapy.  Distance Site: Client's Home Orginating Site: Dr Odette Horns Remote Office Consent: Obtained verbal consent to transmit session remotely.   Patient is aware of the limitations related to conducting virtual therapy.    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  Since the start of treatment, depression has resolved with periodic mild reoccurrences.  Anxiety however, has proven to be chronic and effects all areas of his life.  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  Unfortunately, he application for early retirement was denied and has created a new series of questions about his future work path.  Therefore, we have continued the process of discerning next steps in his career and professional development.  Timothy [name]'s mother is aging and he is beginning to see the signs of the need for additional support and negotiating way to provide the needed support and guidance.   Mental Status Exam: Appearance:   Casual and Fairly Groomed     Behavior:  Appropriate  Motor:  Normal  Speech/Language:   NA  Affect:  Appropriate  Mood:  anxious and irritable  Thought process:  normal  Thought content:    WNL  Sensory/Perceptual disturbances:    WNL  Orientation:  oriented to person, place, time/date, and situation  Attention:  Good  Concentration:  Good  Memory:  WNL  Fund of knowledge:   Good  Insight:    Good  Judgment:   Good  Impulse Control:  Good   Risk Assessment: Danger to Self:  No Self-injurious Behavior: No Danger to Others:  No Duty to Warn:no Physical Aggression / Violence:No  Access to Firearms a concern: No   Substance Abuse History: Current substance abuse: No     Past Psychiatric History:   Previous psychological history is significant for anxiety and depression Outpatient Providers: current therapist History of Psych Hospitalization: No  Psychological Testing:  n/a    Abuse History:  Victim of: No.,  n/a    Report needed: No. Victim of Neglect:No. 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  If a parent, number of children / ages: Timothy [name] (17) they/them  Support Systems:  Spouse Friends Mother and Father-In-Law Daughter  Surveyor, quantity Stress:  Yes   Income/Employment/Disability: Employment  Financial planner: No   Educational History: Education: Risk manager: N/a  Any cultural differences that may affect / interfere with treatment:  not applicable   Recreation/Hobbies: D&D, movies  Stressors: Family Conflict, Aging parent  Barriers:  None   Legal History: Pending legal issue / charges: The patient has no significant history of legal issues. History of legal issue / charges:  n/a  Medical History/Surgical History: reviewed Past Medical History:  Diagnosis Date   AKI (acute kidney injury) (HCC) 12/22/2015   Anxiety    Diabetes mellitus (HCC)    Essential hypertension    started on lisinopril about 45  days prior to pancreatitis   Obesity    Pancreatitis, acute 12/22/2015   a. 11/2015 - acute severe pancreatitis with shock/sepsis, complicated by acute respiratory failure, transaminitis, anemia of critical illness, acute encephalopathy, AKI (Cr 1.36), protein-calorie malnutrition with severe hypoalbuminemia, & hypokalemia.   Splenic vein thrombosis     Past Surgical History:  Procedure Laterality Date   BACK SURGERY     ERCP N/A 01/31/2016   Procedure: ENDOSCOPIC RETROGRADE CHOLANGIOPANCREATOGRAPHY (ERCP);  Surgeon: Jeani Hawking, MD;  Location: [facility]COPY;  Service: Endoscopy;  Laterality: N/A;    Medications: Current Outpatient Medications  Medication Sig Dispense Refill   acetaminophen (TYLENOL) 325 MG tablet Take 650 mg by mouth every 6 (six) hours as needed for fever.     albuterol (PROVENTIL HFA;VENTOLIN HFA) 108 (90 Base) MCG/ACT inhaler Inhale 1-2 puffs into the lungs every 6 (six) hours as needed for wheezing or shortness of breath. 1 Inhaler 0   blood glucose meter kit and supplies Dispense based on patient and insurance preference. Use up to four times daily as directed. (FOR ICD-9 250.00, 250.01). 1 each 0   carvedilol (COREG) 25 MG tablet Take 1 tablet (25 mg total) by mouth 2 (two) times daily. 180 tablet 3   cetirizine (ZYRTEC) 10 MG tablet Take 10 mg by mouth at bedtime.     fluticasone (FLONASE) 50 MCG/ACT nasal spray Place 1 spray into both nostrils at bedtime.     guaiFENesin-codeine 100-10 MG/5ML syrup Take 5 mLs by mouth at bedtime as needed for cough. 120 mL 0   HYDROmorphone (DILAUDID) 1 MG/ML injection Inject 1 mL (1 mg total) into the vein every 4 (four) hours as needed for severe pain. 1 mL 0   imipenem-cilastatin 500 mg in sodium chloride 0.9 % 100 mL Inject 500 mg into the vein every 6 (six) hours.     insulin aspart (NOVOLOG) 100 UNIT/ML FlexPen 8 units with meals (as long as eating 50%) PLUS the following scale: CBG 70 - 120: 0 units CBG 121 - 150: 2 units CBG  151 - 200: 3 units CBG 201 - 250: 5 units CBG 251 - 300: 8 units CBG 301 - 350: 11 units CBG 351 - 400: 15 units (Patient taking differently: Inject 8 Units into the skin 3 (three) times daily with meals. 8 units with meals (as long as eating 50%) PLUS the following scale: CBG 70 - 120: 0 units CBG 121 - 150: 2 units CBG 151 - 200: 3 units CBG 201 - 250: 5 units CBG 251 - 300: 8 units CBG 301 - 350: 11 units CBG 351 - 400: 15 units) 60 mL 11   Insulin Glargine (LANTUS SOLOSTAR) 100 UNIT/ML Solostar Pen Inject 25 Units into the skin daily at 10 pm. 15 mL 11   Insulin Pen Needle 31G X 5 MM MISC For insulin injections 100 each 1   pantoprazole (PROTONIX) 40 MG tablet Take 1 tablet (40 mg total) by mouth at bedtime. 30 tablet 0   No current facility-administered medications for this visit.    No Known Allergies  Individualized Treatment Plan                Strengths: verbal, bright, friendly  Supports: wife, daughter, parents   Goal/Needs for Treatment:  In order of importance to patient 1)  Resolve conflicted feelings, takes steps to change work situation and adapt to the new life circumstances.   2) Learn and implement coping skills that result in a reduction of anxiety and worry, and improved daily functioning.  3) Increase activities that reinforce a positive self-identity.  4) Learn and implement coping skills that result in a prevention of of relapse of depression and improve daily functioning.    Client Statement of Needs: work on pervasive issues with anxiety, assistance in making a big life change as he contemplates retiring from current position and figures out new direction   Treatment Level: Individual Biweekly Outpatient Psychotherapy  Symptoms: Autonomic hyperactivity (e.g., palpitations, shortness of breath, dry mouth, trouble swallowing, nausea, diarrhea).  Periodic periods of depressed or irritable mood.  Excessive and/or unrealistic worry that is difficult  to control occurring more days than not for at least 6 months about a number of events or activities.  Feelings of hopelessness, worthlessness, or inappropriate guilt.  Hypervigilance (e.g., feeling constantly on edge, experiencing concentration difficulties, having trouble falling or staying asleep, exhibiting a general state of irritability).  Low self-esteem.  Behavioral passivity, difficulties initiating action Motor tension (e.g., restlessness, tiredness, shakiness, muscle tension).  Restlessness and feelings of lost identity and meaning due to retirement.   Client Treatment Preferences: continue with current therapist   Healthcare consumer's goal for treatment:  Psychologist, Hilma Favors, Ph.D. will support the patient's ability to achieve the goals identified. Cognitive Behavioral Therapy, Dialectical Behavioral Therapy, Motivational Interviewing, Behavior Activation, parent skills, and other evidenced-based practices will be used to promote progress towards healthy functioning.   Healthcare consumer Timothy [name] will: Actively participate in therapy, working towards healthy functioning.    *Justification for Continuation/Discontinuation of Goal: R=Revised, O=Ongoing, A=Achieved, D=Discontinued  Goal 1) Resolve conflicted feelings, takes steps to change work situation and adapt to the new life circumstances.   5 Point Likert rating baseline date: 02/22/2021 Target Date Goal Was reviewed Status Code Progress towards goal/Likert rating  03/20/2023 03/07/2022          O 2/5 - pt has taken initial steps but at times falls into passivity and has made little progress  03/19/2024 03/20/2023          O 4/5 - pt has taken steps to move forward with early retirement, but now is challenged w/ a denial & the p/ begins again        Goal 2) Learn and implement coping skills that result in a reduction of anxiety and worry, and improved daily functioning.  5 Point Likert rating baseline date:  02/23/2015 Target Date Goal Was reviewed Status Code Progress towards goal/Likert rating  03/20/2023 03/07/2022          O 3/5 - pt has learned coping skills and uses them appropriately but inconsistently  03/19/2024 03/20/2023          O 3.5 /5 - pt is better at identifying when he is anxious & more consistently uses his skills        Goal 3) Increase activities that reinforce a positive self-identity.   5 Point Likert rating baseline date: 02/23/2015 Target Date Goal Was reviewed Status Code Progress towards goal/Likert rating  03/20/2023 03/07/2022  O 2/5 - pt has taken initial steps to introduce new activities that have improved his feelings of positive self esteem  03/19/2024 03/20/2023          O 2.5/5 - pt has not taken any new steps but has recognized the need to do more & is making plans        Goal 4) Learn and implement coping skills that result in a prevention of of relapse of depression and improve  daily functioning.   5  Point Likert rating baseline date: 02/23/2015 Target Date Goal Was reviewed Status Code Progress towards goal/Likert rating   03/20/2023  03/07/2022            O  4/5 - pt has learned coping skills and uses them appropriately but inconsistently   03/19/2024  03/20/2023            O  3/5 - pt has regressed & has slipped back into old patterns which has effected his mood & fnxg         This plan has been reviewed and created by the following participants:  This plan will be reviewed at least every 12 months. Date Behavioral Health Clinician Date Guardian/Patient   03/07/2022  Hilma Favors, Ph.D.   03/07/2022 Timothy [name]  03/20/2023  Hilma Favors, Ph.D.  03/20/2023 Timothy [name]               Diagnoses:  Generalized Anxiety Disorder Phase of Life Change - Adult  [place] reports that he has been consistent with his exercise and is noticing a difference in how much insulin he needs.  We d/ other benefits he is experiencing and next steps (ie., add  core workout).    Alexus continues to feel stressed at work.  We d/p that he is due for his annual appraisal, his plans to inform his director of his request for a demotion, and how to manage his anxiety about the process.    Hilma Favors, PhD

## 2023-07-10 ENCOUNTER — Ambulatory Visit: Payer: Federal, State, Local not specified - PPO | Admitting: Psychology

## 2023-07-10 DIAGNOSIS — Z600 Problems of adjustment to life-cycle transitions: Secondary | ICD-10-CM | POA: Diagnosis not present

## 2023-07-10 DIAGNOSIS — F411 Generalized anxiety disorder: Secondary | ICD-10-CM | POA: Diagnosis not present

## 2023-07-10 NOTE — Progress Notes (Signed)
PROGRESS NOTE:  Name: Darus Howle Date: 07/10/2023 MRN: [id number] DOB: 1975-09-12 PCP: Clayborn Heron, MD  Time spent: 8:01 AM - 8:58 AM  Annual Review: 03/19/2024  Today I met with  [name] in remote video (Caregility) face-to-face individual psychotherapy.  Distance Site: Client's Home Orginating Site: Dr Odette Horns Remote Office Consent: Obtained verbal consent to transmit session remotely.   Patient is aware of the limitations related to conducting virtual therapy.    Reason for Visit /Presenting Problem: Eastyn Evanoff is a 47 y.o. WMM who started therapy several years ago seeking treatment for depression and anxiety.  Since the start of treatment, depression has resolved with periodic mild reoccurrences.  Anxiety however, has proven to be chronic and effects all areas of his life.  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  Unfortunately, he application for early retirement was denied and has created a new series of questions about his future work path.  Therefore, we have continued the process of discerning next steps in his career and professional development.  Bohdan's mother is aging and he is beginning to see the signs of the need for additional support and negotiating way to provide the needed support and guidance.   Mental Status Exam: Appearance:   Casual and Fairly Groomed     Behavior:  Appropriate  Motor:  Normal  Speech/Language:   NA  Affect:  Appropriate  Mood:  anxious and irritable  Thought process:  normal  Thought content:    WNL  Sensory/Perceptual disturbances:    WNL  Orientation:  oriented to person, place, time/date, and situation  Attention:  Good  Concentration:  Good  Memory:  WNL  Fund of knowledge:   Good  Insight:    Good  Judgment:   Good  Impulse Control:  Good   Risk Assessment: Danger to Self:  No Self-injurious Behavior: No Danger to Others:  No Duty to Warn:no Physical Aggression / Violence:No  Access to Firearms a concern: No   Substance Abuse History: Current substance abuse: No     Past Psychiatric History:   Previous psychological history is significant for anxiety and depression Outpatient Providers: current therapist History of Psych Hospitalization: No  Psychological Testing:  n/a    Abuse History:  Victim of: No.,  n/a    Report needed: No. Victim of Neglect:No. 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  If a parent, number of children / ages: [name] (72) they/them  Support Systems:  Spouse Friends Mother and Father-In-Law Daughter  Surveyor, quantity Stress:  Yes   Income/Employment/Disability: Employment  Financial planner: No   Educational History: Education: Risk manager: N/a  Any cultural differences that may affect / interfere with treatment:  not applicable   Recreation/Hobbies: D&D, movies  Stressors: Family Conflict, Aging parent  Barriers:  None   Legal History: Pending legal issue / charges: The patient has no significant history of legal issues. History of legal issue / charges:  n/a  Medical History/Surgical History: reviewed Past Medical History:  Diagnosis Date   AKI (acute kidney injury) (HCC) 12/22/2015   Anxiety    Diabetes mellitus (HCC)    Essential hypertension    started on lisinopril about 45  days prior to pancreatitis   Obesity    Pancreatitis, acute 12/22/2015   a. 11/2015 - acute severe pancreatitis with shock/sepsis, complicated by acute respiratory failure, transaminitis, anemia of critical illness, acute encephalopathy, AKI (Cr 1.36), protein-calorie malnutrition with severe hypoalbuminemia, & hypokalemia.   Splenic vein thrombosis     Past Surgical History:  Procedure Laterality Date   BACK SURGERY     ERCP N/A 01/31/2016   Procedure: ENDOSCOPIC RETROGRADE CHOLANGIOPANCREATOGRAPHY (ERCP);  Surgeon: Jeani Hawking, MD;  Location: [facility] ENDOSCOPY;  Service: Endoscopy;  Laterality: N/A;    Medications: Current Outpatient Medications  Medication Sig Dispense Refill   acetaminophen (TYLENOL) 325 MG tablet Take 650 mg by mouth every 6 (six) hours as needed for fever.     albuterol (PROVENTIL HFA;VENTOLIN HFA) 108 (90 Base) MCG/ACT inhaler Inhale 1-2 puffs into the lungs every 6 (six) hours as needed for wheezing or shortness of breath. 1 Inhaler 0   blood glucose meter kit and supplies Dispense based on patient and insurance preference. Use up to four times daily as directed. (FOR ICD-9 250.00, 250.01). 1 each 0   carvedilol (COREG) 25 MG tablet Take 1 tablet (25 mg total) by mouth 2 (two) times daily. 180 tablet 3   cetirizine (ZYRTEC) 10 MG tablet Take 10 mg by mouth at bedtime.     fluticasone (FLONASE) 50 MCG/ACT nasal spray Place 1 spray into both nostrils at bedtime.     guaiFENesin-codeine 100-10 MG/5ML syrup Take 5 mLs by mouth at bedtime as needed for cough. 120 mL 0   HYDROmorphone (DILAUDID) 1 MG/ML injection Inject 1 mL (1 mg total) into the vein every 4 (four) hours as needed for severe pain. 1 mL 0   imipenem-cilastatin 500 mg in sodium chloride 0.9 % 100 mL Inject 500 mg into the vein every 6 (six) hours.     insulin aspart (NOVOLOG) 100 UNIT/ML FlexPen 8 units with meals (as long as eating 50%) PLUS the following scale: CBG 70 - 120: 0 units CBG 121 - 150: 2 units CBG  151 - 200: 3 units CBG 201 - 250: 5 units CBG 251 - 300: 8 units CBG 301 - 350: 11 units CBG 351 - 400: 15 units (Patient taking differently: Inject 8 Units into the skin 3 (three) times daily with meals. 8 units with meals (as long as eating 50%) PLUS the following scale: CBG 70 - 120: 0 units CBG 121 - 150: 2 units CBG 151 - 200: 3 units CBG 201 - 250: 5 units CBG 251 - 300: 8 units CBG 301 - 350: 11 units CBG 351 - 400: 15 units) 60 mL 11   Insulin Glargine (LANTUS SOLOSTAR) 100 UNIT/ML Solostar Pen Inject 25 Units into the skin daily at 10 pm. 15 mL 11   Insulin Pen Needle 31G X 5 MM MISC For insulin injections 100 each 1   pantoprazole (PROTONIX) 40 MG tablet Take 1 tablet (40 mg total) by mouth at bedtime. 30 tablet 0   No current facility-administered medications for this visit.    No Known Allergies  Individualized Treatment Plan                Strengths: verbal, bright, friendly  Supports: wife, daughter, parents   Goal/Needs for Treatment:  In order of importance to patient 1)  Resolve conflicted feelings, takes steps to change work situation and adapt to the new life circumstances.   2) Learn and implement coping skills that result in a reduction of anxiety and worry, and improved daily functioning.  3) Increase activities that reinforce a positive self-identity.  4) Learn and implement coping skills that result in a prevention of of relapse of depression and improve daily functioning.    Client Statement of Needs: work on pervasive issues with anxiety, assistance in making a big life change as he contemplates retiring from current position and figures out new direction   Treatment Level: Individual Biweekly Outpatient Psychotherapy  Symptoms: Autonomic hyperactivity (e.g., palpitations, shortness of breath, dry mouth, trouble swallowing, nausea, diarrhea).  Periodic periods of depressed or irritable mood.  Excessive and/or unrealistic worry that is difficult  to control occurring more days than not for at least 6 months about a number of events or activities.  Feelings of hopelessness, worthlessness, or inappropriate guilt.  Hypervigilance (e.g., feeling constantly on edge, experiencing concentration difficulties, having trouble falling or staying asleep, exhibiting a general state of irritability).  Low self-esteem.  Behavioral passivity, difficulties initiating action Motor tension (e.g., restlessness, tiredness, shakiness, muscle tension).  Restlessness and feelings of lost identity and meaning due to retirement.   Client Treatment Preferences: continue with current therapist   Healthcare consumer's goal for treatment:  Psychologist, Hilma Favors, Ph.D. will support the patient's ability to achieve the goals identified. Cognitive Behavioral Therapy, Dialectical Behavioral Therapy, Motivational Interviewing, Behavior Activation, parent skills, and other evidenced-based practices will be used to promote progress towards healthy functioning.   Healthcare consumer Paolo Dubon will: Actively participate in therapy, working towards healthy functioning.    *Justification for Continuation/Discontinuation of Goal: R=Revised, O=Ongoing, A=Achieved, D=Discontinued  Goal 1) Resolve conflicted feelings, takes steps to change work situation and adapt to the new life circumstances.   5 Point Likert rating baseline date: 02/22/2021 Target Date Goal Was reviewed Status Code Progress towards goal/Likert rating  03/20/2023 03/07/2022          O 2/5 - pt has taken initial steps but at times falls into passivity and has made little progress  03/19/2024 03/20/2023          O 4/5 - pt has taken steps to move forward with early retirement, but now is challenged w/ a denial & the p/ begins again        Goal 2) Learn and implement coping skills that result in a reduction of anxiety and worry, and improved daily functioning.  5 Point Likert rating baseline date:  02/23/2015 Target Date Goal Was reviewed Status Code Progress towards goal/Likert rating  03/20/2023 03/07/2022          O 3/5 - pt has learned coping skills and uses them appropriately but inconsistently  03/19/2024 03/20/2023          O 3.5 /5 - pt is better at identifying when he is anxious & more consistently uses his skills        Goal 3) Increase activities that reinforce a positive self-identity.   5 Point Likert rating baseline date: 02/23/2015 Target Date Goal Was reviewed Status Code Progress towards goal/Likert rating  03/20/2023 03/07/2022  O 2/5 - pt has taken initial steps to introduce new activities that have improved his feelings of positive self esteem  03/19/2024 03/20/2023          O 2.5/5 - pt has not taken any new steps but has recognized the need to do more & is making plans        Goal 4) Learn and implement coping skills that result in a prevention of of relapse of depression and improve  daily functioning.   5  Point Likert rating baseline date: 02/23/2015 Target Date Goal Was reviewed Status Code Progress towards goal/Likert rating   03/20/2023  03/07/2022            O  4/5 - pt has learned coping skills and uses them appropriately but inconsistently   03/19/2024  03/20/2023            O  3/5 - pt has regressed & has slipped back into old patterns which has effected his mood & fnxg         This plan has been reviewed and created by the following participants:  This plan will be reviewed at least every 12 months. Date Behavioral Health Clinician Date Guardian/Patient   03/07/2022  Hilma Favors, Ph.D.   03/07/2022 [name]  03/20/2023  Hilma Favors, Ph.D.  03/20/2023 [name]               Diagnoses:  Generalized Anxiety Disorder Phase of Life Change - Adult  Henos reports that he had his f/u with his PCP and was happy that his A1C was down.  We d/p that the lifestyle changes he is making are having a positive impact.    Woodley continues  to feel stressed at work.  We d/p that he is due for his annual appraisal, his plans to inform his director of his request for a demotion, and how to manage his anxiety about the process.  He continues to wait  on his supervisor.  We d/e/p that she may be avoiding him, his habitual pattern of behaving with her, and how to move in a different direction.  Hilma Favors, PhD

## 2023-07-24 ENCOUNTER — Ambulatory Visit: Payer: Federal, State, Local not specified - PPO | Admitting: Psychology

## 2023-08-07 ENCOUNTER — Ambulatory Visit: Payer: Federal, State, Local not specified - PPO | Admitting: Psychology

## 2023-08-07 DIAGNOSIS — Z600 Problems of adjustment to life-cycle transitions: Secondary | ICD-10-CM | POA: Diagnosis not present

## 2023-08-07 DIAGNOSIS — F411 Generalized anxiety disorder: Secondary | ICD-10-CM

## 2023-08-07 NOTE — Progress Notes (Signed)
 PROGRESS NOTE:  Name: Timothy [name] Date: 08/07/2023 MRN: [id number] DOB: 06-Mar-1976 PCP: Loretha Richerd SAUNDERS, MD  Time spent: 8:01 AM - 8:58 AM  Annual Review: 03/19/2024  Today I met with  Timothy [name] in remote video (Caregility) face-to-face individual psychotherapy.  Distance Site: Client's Home Orginating Site: Dr Edison Remote Office Consent: Obtained verbal consent to transmit session remotely.   Patient is aware of the limitations related to conducting virtual therapy.    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  Since the start of treatment, depression has resolved with periodic mild reoccurrences.  Anxiety however, has proven to be chronic and effects all areas of his life.  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  Unfortunately, he application for early retirement was denied and has created a new series of questions about his future work path.  Therefore, we have continued the process of discerning next steps in his career and professional development.  Timothy [name]'s mother is aging and he is beginning to see the signs of the need for additional support and negotiating way to provide the needed support and guidance.   Mental Status Exam: Appearance:   Casual and Fairly Groomed     Behavior:  Appropriate  Motor:  Normal  Speech/Language:   NA  Affect:  Appropriate  Mood:  anxious and irritable  Thought process:  normal  Thought content:    WNL  Sensory/Perceptual disturbances:    WNL  Orientation:  oriented to person, place, time/date, and situation  Attention:  Good  Concentration:  Good  Memory:  WNL  Fund of knowledge:   Good  Insight:    Good  Judgment:   Good  Impulse Control:  Good   Risk Assessment: Danger to Self:  No Self-injurious Behavior: No Danger to Others:  No Duty to Warn:no Physical Aggression / Violence:No  Access to Firearms a concern: No   Substance Abuse History: Current substance abuse: No     Past Psychiatric History:   Previous psychological history is significant for anxiety and depression Outpatient Providers: current therapist History of Psych Hospitalization: No  Psychological Testing:  n/a    Abuse History:  Victim of: No.,  n/a    Report needed: No. Victim of Neglect:No. 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  If a parent, number of children / ages: Timothy [name] (21) they/them  Support Systems:  Spouse Friends Mother and Father-In-Law Daughter  Surveyor, Quantity Stress:  Yes   Income/Employment/Disability: Employment  Financial Planner: No   Educational History: Education: Risk Manager: N/a  Any cultural differences that may affect / interfere with treatment:  not applicable   Recreation/Hobbies: D&D, movies  Stressors: Family Conflict, Aging parent  Barriers:  None   Legal History: Pending legal issue / charges: The patient has no significant history of legal issues. History of legal issue / charges:  n/a  Medical History/Surgical History: reviewed Past Medical History:  Diagnosis Date   AKI (acute kidney injury) (HCC) 12/22/2015   Anxiety    Diabetes mellitus (HCC)    Essential hypertension    started on lisinopril about 45  days prior to pancreatitis   Obesity    Pancreatitis, acute 12/22/2015   a. 11/2015 - acute severe pancreatitis with shock/sepsis, complicated by acute respiratory failure, transaminitis, anemia of critical illness, acute encephalopathy, AKI (Cr 1.36), protein-calorie malnutrition with severe hypoalbuminemia, & hypokalemia.   Splenic vein thrombosis     Past Surgical History:  Procedure Laterality Date   BACK SURGERY     ERCP N/A 01/31/2016   Procedure: ENDOSCOPIC RETROGRADE CHOLANGIOPANCREATOGRAPHY (ERCP);  Surgeon: Belvie Just, MD;  Location: [facility] ENDOSCOPY;  Service: Endoscopy;  Laterality: N/A;    Medications: Current Outpatient Medications  Medication Sig Dispense Refill   acetaminophen  (TYLENOL ) 325 MG tablet Take 650 mg by mouth every 6 (six) hours as needed for fever.     albuterol  (PROVENTIL  HFA;VENTOLIN  HFA) 108 (90 Base) MCG/ACT inhaler Inhale 1-2 puffs into the lungs every 6 (six) hours as needed for wheezing or shortness of breath. 1 Inhaler 0   blood glucose meter kit and supplies Dispense based on patient and insurance preference. Use up to four times daily as directed. (FOR ICD-9 250.00, 250.01). 1 each 0   carvedilol  (COREG ) 25 MG tablet Take 1 tablet (25 mg total) by mouth 2 (two) times daily. 180 tablet 3   cetirizine (ZYRTEC) 10 MG tablet Take 10 mg by mouth at bedtime.     fluticasone (FLONASE) 50 MCG/ACT nasal spray Place 1 spray into both nostrils at bedtime.     guaiFENesin -codeine  100-10 MG/5ML syrup Take 5 mLs by mouth at bedtime as needed for cough. 120 mL 0   HYDROmorphone  (DILAUDID ) 1 MG/ML injection Inject 1 mL (1 mg total) into the vein every 4 (four) hours as needed for severe pain. 1 mL 0   imipenem -cilastatin  500 mg in sodium chloride  0.9 % 100 mL Inject 500 mg into the vein every 6 (six) hours.     insulin  aspart (NOVOLOG ) 100 UNIT/ML FlexPen 8 units with meals (as long as eating 50%) PLUS the following scale: CBG 70 - 120: 0 units CBG 121 - 150: 2 units CBG  151 - 200: 3 units CBG 201 - 250: 5 units CBG 251 - 300: 8 units CBG 301 - 350: 11 units CBG 351 - 400: 15 units (Patient taking differently: Inject 8 Units into the skin 3 (three) times daily with meals. 8 units with meals (as long as eating 50%) PLUS the following scale: CBG 70 - 120: 0 units CBG 121 - 150: 2 units CBG 151 - 200: 3 units CBG 201 - 250: 5 units CBG 251 - 300: 8 units CBG 301 - 350: 11 units CBG 351 - 400: 15 units) 60 mL 11   Insulin  Glargine (LANTUS  SOLOSTAR) 100 UNIT/ML Solostar Pen Inject 25 Units into the skin daily at 10 pm. 15 mL 11   Insulin  Pen Needle 31G X 5 MM MISC For insulin  injections 100 each 1   pantoprazole  (PROTONIX ) 40 MG tablet Take 1 tablet (40 mg total) by mouth at bedtime. 30 tablet 0   No current facility-administered medications for this visit.    No Known Allergies  Individualized Treatment Plan                Strengths: verbal, bright, friendly  Supports: wife, daughter, parents   Goal/Needs for Treatment:  In order of importance to patient 1)  Resolve conflicted feelings, takes steps to change work situation and adapt to the new life circumstances.   2) Learn and implement coping skills that result in a reduction of anxiety and worry, and improved daily functioning.  3) Increase activities that reinforce a positive self-identity.  4) Learn and implement coping skills that result in a prevention of of relapse of depression and improve daily functioning.    Client Statement of Needs: work on pervasive issues with anxiety, assistance in making a big life change as he contemplates retiring from current position and figures out new direction   Treatment Level: Individual Biweekly Outpatient Psychotherapy  Symptoms: Autonomic hyperactivity (e.g., palpitations, shortness of breath, dry mouth, trouble swallowing, nausea, diarrhea).  Periodic periods of depressed or irritable mood.  Excessive and/or unrealistic worry that is difficult  to control occurring more days than not for at least 6 months about a number of events or activities.  Feelings of hopelessness, worthlessness, or inappropriate guilt.  Hypervigilance (e.g., feeling constantly on edge, experiencing concentration difficulties, having trouble falling or staying asleep, exhibiting a general state of irritability).  Low self-esteem.  Behavioral passivity, difficulties initiating action Motor tension (e.g., restlessness, tiredness, shakiness, muscle tension).  Restlessness and feelings of lost identity and meaning due to retirement.   Client Treatment Preferences: continue with current therapist   Healthcare consumer's goal for treatment:  Psychologist, Timothy [name], Ph.D. will support the patient's ability to achieve the goals identified. Cognitive Behavioral Therapy, Dialectical Behavioral Therapy, Motivational Interviewing, Behavior Activation, parent skills, and other evidenced-based practices will be used to promote progress towards healthy functioning.   Healthcare consumer Timothy [name] will: Actively participate in therapy, working towards healthy functioning.    *Justification for Continuation/Discontinuation of Goal: R=Revised, O=Ongoing, A=Achieved, D=Discontinued  Goal 1) Resolve conflicted feelings, takes steps to change work situation and adapt to the new life circumstances.   5 Point Likert rating baseline date: 02/22/2021 Target Date Goal Was reviewed Status Code Progress towards goal/Likert rating  03/20/2023 03/07/2022          O 2/5 - pt has taken initial steps but at times falls into passivity and has made little progress  03/19/2024 03/20/2023          O 4/5 - pt has taken steps to move forward with early retirement, but now is challenged w/ a denial & the p/ begins again        Goal 2) Learn and implement coping skills that result in a reduction of anxiety and worry, and improved daily functioning.  5 Point Likert rating baseline date:  02/23/2015 Target Date Goal Was reviewed Status Code Progress towards goal/Likert rating  03/20/2023 03/07/2022          O 3/5 - pt has learned coping skills and uses them appropriately but inconsistently  03/19/2024 03/20/2023          O 3.5 /5 - pt is better at identifying when he is anxious & more consistently uses his skills        Goal 3) Increase activities that reinforce a positive self-identity.   5 Point Likert rating baseline date: 02/23/2015 Target Date Goal Was reviewed Status Code Progress towards goal/Likert rating  03/20/2023 03/07/2022  O 2/5 - pt has taken initial steps to introduce new activities that have improved his feelings of positive self esteem  03/19/2024 03/20/2023          O 2.5/5 - pt has not taken any new steps but has recognized the need to do more & is making plans        Goal 4) Learn and implement coping skills that result in a prevention of of relapse of depression and improve  daily functioning.   5  Point Likert rating baseline date: 02/23/2015 Target Date Goal Was reviewed Status Code Progress towards goal/Likert rating   03/20/2023  03/07/2022            O  4/5 - pt has learned coping skills and uses them appropriately but inconsistently   03/19/2024  03/20/2023            O  3/5 - pt has regressed & has slipped back into old patterns which has effected his mood & fnxg         This plan has been reviewed and created by the following participants:  This plan will be reviewed at least every 12 months. Date Behavioral Health Clinician Date Guardian/Patient   03/07/2022  Timothy [name], Ph.D.   03/07/2022 Timothy [name]  03/20/2023  Timothy [name], Ph.D.  03/20/2023 Timothy [name]               Diagnoses:  Generalized Anxiety Disorder Phase of Life Change - Adult  Jjesus reports that he had a nice time at his family reunion over the holiday.  There were some challenges he had with his wife which we d/e/p and how he managed her feelings of  abandonment.    Oaklan states that he was able to finally have a d/ with his supervisor about requesting a demotion.  We d/p what a weight it was off his shoulders. And next steps.     Timothy Jenkins Sprung, PhD

## 2023-08-21 ENCOUNTER — Ambulatory Visit: Payer: Federal, State, Local not specified - PPO | Admitting: Psychology

## 2023-08-21 DIAGNOSIS — F411 Generalized anxiety disorder: Secondary | ICD-10-CM | POA: Diagnosis not present

## 2023-08-21 DIAGNOSIS — Z600 Problems of adjustment to life-cycle transitions: Secondary | ICD-10-CM

## 2023-08-21 NOTE — Progress Notes (Signed)
PROGRESS NOTE:  Name: Timothy [name] Date: 08/21/2023 MRN: [id number] DOB: 12/05/1975 PCP: Clayborn Heron, MD  Time spent: 8:02 AM - 8:59 AM  Annual Review: 03/19/2024  Today I met with  Timothy [name] in remote video (Caregility) face-to-face individual psychotherapy.  Distance Site: Client's Home Orginating Site: Dr Odette Horns Remote Office Consent: Obtained verbal consent to transmit session remotely.   Patient is aware of the limitations related to conducting virtual therapy.    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  Since the start of treatment, depression has resolved with periodic mild reoccurrences.  Anxiety however, has proven to be chronic and effects all areas of his life.  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  Unfortunately, he application for early retirement was denied and has created a new series of questions about his future work path.  Therefore, we have continued the process of discerning next steps in his career and professional development.  Aveon's mother is aging and he is beginning to see the signs of the need for additional support and negotiating way to provide the needed support and guidance.   Mental Status Exam: Appearance:   Casual and Fairly Groomed     Behavior:  Appropriate  Motor:  Normal  Speech/Language:   NA  Affect:  Appropriate  Mood:  anxious and irritable  Thought process:  normal  Thought content:    WNL  Sensory/Perceptual disturbances:    WNL  Orientation:  oriented to person, place, time/date, and situation  Attention:  Good  Concentration:  Good  Memory:  WNL  Fund of knowledge:   Good  Insight:    Good  Judgment:   Good  Impulse Control:  Good   Risk Assessment: Danger to Self:  No Self-injurious Behavior: No Danger to Others:  No Duty to Warn:no Physical Aggression / Violence:No  Access to Firearms a concern: No   Substance Abuse History: Current substance abuse: No     Past Psychiatric History:   Previous psychological history is significant for anxiety and depression Outpatient Providers: current therapist History of Psych Hospitalization: No  Psychological Testing:  n/a    Abuse History:  Victim of: No.,  n/a    Report needed: No. Victim of Neglect:No. 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  If a parent, number of children / ages: [name] (98) they/them  Support Systems:  Spouse Friends Mother and Father-In-Law Daughter  Surveyor, quantity Stress:  Yes   Income/Employment/Disability: Employment  Financial planner: No   Educational History: Education: Risk manager: N/a  Any cultural differences that may affect / interfere with treatment:  not applicable   Recreation/Hobbies: D&D, movies  Stressors: Family Conflict, Aging parent  Barriers:  None   Legal History: Pending legal issue / charges: The patient has no significant history of legal issues. History of legal issue / charges:  n/a  Medical History/Surgical History: reviewed Past Medical History:  Diagnosis Date   AKI (acute kidney injury) (HCC) 12/22/2015   Anxiety    Diabetes mellitus (HCC)    Essential hypertension    started on lisinopril about 45  days prior to pancreatitis   Obesity    Pancreatitis, acute 12/22/2015   a. 11/2015 - acute severe pancreatitis with shock/sepsis, complicated by acute respiratory failure, transaminitis, anemia of critical illness, acute encephalopathy, AKI (Cr 1.36), protein-calorie malnutrition with severe hypoalbuminemia, & hypokalemia.   Splenic vein thrombosis     Past Surgical History:  Procedure Laterality Date   BACK SURGERY     ERCP N/A 01/31/2016   Procedure: ENDOSCOPIC RETROGRADE CHOLANGIOPANCREATOGRAPHY (ERCP);  Surgeon: Jeani Hawking, MD;  Location[facility] ENDOSCOPY;  Service: Endoscopy;  Laterality: N/A;    Medications: Current Outpatient Medications  Medication Sig Dispense Refill   acetaminophen (TYLENOL) 325 MG tablet Take 650 mg by mouth every 6 (six) hours as needed for fever.     albuterol (PROVENTIL HFA;VENTOLIN HFA) 108 (90 Base) MCG/ACT inhaler Inhale 1-2 puffs into the lungs every 6 (six) hours as needed for wheezing or shortness of breath. 1 Inhaler 0   blood glucose meter kit and supplies Dispense based on patient and insurance preference. Use up to four times daily as directed. (FOR ICD-9 250.00, 250.01). 1 each 0   carvedilol (COREG) 25 MG tablet Take 1 tablet (25 mg total) by mouth 2 (two) times daily. 180 tablet 3   cetirizine (ZYRTEC) 10 MG tablet Take 10 mg by mouth at bedtime.     fluticasone (FLONASE) 50 MCG/ACT nasal spray Place 1 spray into both nostrils at bedtime.     guaiFENesin-codeine 100-10 MG/5ML syrup Take 5 mLs by mouth at bedtime as needed for cough. 120 mL 0   HYDROmorphone (DILAUDID) 1 MG/ML injection Inject 1 mL (1 mg total) into the vein every 4 (four) hours as needed for severe pain. 1 mL 0   imipenem-cilastatin 500 mg in sodium chloride 0.9 % 100 mL Inject 500 mg into the vein every 6 (six) hours.     insulin aspart (NOVOLOG) 100 UNIT/ML FlexPen 8 units with meals (as long as eating 50%) PLUS the following scale: CBG 70 - 120: 0 units CBG 121 - 150: 2 units CBG  151 - 200: 3 units CBG 201 - 250: 5 units CBG 251 - 300: 8 units CBG 301 - 350: 11 units CBG 351 - 400: 15 units (Patient taking differently: Inject 8 Units into the skin 3 (three) times daily with meals. 8 units with meals (as long as eating 50%) PLUS the following scale: CBG 70 - 120: 0 units CBG 121 - 150: 2 units CBG 151 - 200: 3 units CBG 201 - 250: 5 units CBG 251 - 300: 8 units CBG 301 - 350: 11 units CBG 351 - 400: 15 units) 60 mL 11   Insulin Glargine (LANTUS SOLOSTAR) 100 UNIT/ML Solostar Pen Inject 25 Units into the skin daily at 10 pm. 15 mL 11   Insulin Pen Needle 31G X 5 MM MISC For insulin injections 100 each 1   pantoprazole (PROTONIX) 40 MG tablet Take 1 tablet (40 mg total) by mouth at bedtime. 30 tablet 0   No current facility-administered medications for this visit.    No Known Allergies  Individualized Treatment Plan                Strengths: verbal, bright, friendly  Supports: wife, daughter, parents   Goal/Needs for Treatment:  In order of importance to patient 1)  Resolve conflicted feelings, takes steps to change work situation and adapt to the new life circumstances.   2) Learn and implement coping skills that result in a reduction of anxiety and worry, and improved daily functioning.  3) Increase activities that reinforce a positive self-identity.  4) Learn and implement coping skills that result in a prevention of of relapse of depression and improve daily functioning.    Client Statement of Needs: work on pervasive issues with anxiety, assistance in making a big life change as he contemplates retiring from current position and figures out new direction   Treatment Level: Individual Biweekly Outpatient Psychotherapy  Symptoms: Autonomic hyperactivity (e.g., palpitations, shortness of breath, dry mouth, trouble swallowing, nausea, diarrhea).  Periodic periods of depressed or irritable mood.  Excessive and/or unrealistic worry that is difficult  to control occurring more days than not for at least 6 months about a number of events or activities.  Feelings of hopelessness, worthlessness, or inappropriate guilt.  Hypervigilance (e.g., feeling constantly on edge, experiencing concentration difficulties, having trouble falling or staying asleep, exhibiting a general state of irritability).  Low self-esteem.  Behavioral passivity, difficulties initiating action Motor tension (e.g., restlessness, tiredness, shakiness, muscle tension).  Restlessness and feelings of lost identity and meaning due to retirement.   Client Treatment Preferences: continue with current therapist   Healthcare consumer's goal for treatment:  Psychologist, Hilma Favors, Ph.D. will support the patient's ability to achieve the goals identified. Cognitive Behavioral Therapy, Dialectical Behavioral Therapy, Motivational Interviewing, Behavior Activation, parent skills, and other evidenced-based practices will be used to promote progress towards healthy functioning.   Healthcare consumer Asah Ramsier will: Actively participate in therapy, working towards healthy functioning.    *Justification for Continuation/Discontinuation of Goal: R=Revised, O=Ongoing, A=Achieved, D=Discontinued  Goal 1) Resolve conflicted feelings, takes steps to change work situation and adapt to the new life circumstances.   5 Point Likert rating baseline date: 02/22/2021 Target Date Goal Was reviewed Status Code Progress towards goal/Likert rating  03/20/2023 03/07/2022          O 2/5 - pt has taken initial steps but at times falls into passivity and has made little progress  03/19/2024 03/20/2023          O 4/5 - pt has taken steps to move forward with early retirement, but now is challenged w/ a denial & the p/ begins again        Goal 2) Learn and implement coping skills that result in a reduction of anxiety and worry, and improved daily functioning.  5 Point Likert rating baseline date:  02/23/2015 Target Date Goal Was reviewed Status Code Progress towards goal/Likert rating  03/20/2023 03/07/2022          O 3/5 - pt has learned coping skills and uses them appropriately but inconsistently  03/19/2024 03/20/2023          O 3.5 /5 - pt is better at identifying when he is anxious & more consistently uses his skills        Goal 3) Increase activities that reinforce a positive self-identity.   5 Point Likert rating baseline date: 02/23/2015 Target Date Goal Was reviewed Status Code Progress towards goal/Likert rating  03/20/2023 03/07/2022  O 2/5 - pt has taken initial steps to introduce new activities that have improved his feelings of positive self esteem  03/19/2024 03/20/2023          O 2.5/5 - pt has not taken any new steps but has recognized the need to do more & is making plans        Goal 4) Learn and implement coping skills that result in a prevention of of relapse of depression and improve  daily functioning.   5  Point Likert rating baseline date: 02/23/2015 Target Date Goal Was reviewed Status Code Progress towards goal/Likert rating   03/20/2023  03/07/2022            O  4/5 - pt has learned coping skills and uses them appropriately but inconsistently   03/19/2024  03/20/2023            O  3/5 - pt has regressed & has slipped back into old patterns which has effected his mood & fnxg         This plan has been reviewed and created by the following participants:  This plan will be reviewed at least every 12 months. Date Behavioral Health Clinician Date Guardian/Patient   03/07/2022  Hilma Favors, Ph.D.   03/07/2022 Timothy [name]  03/20/2023  Hilma Favors, Ph.D.  03/20/2023 Timothy [name]               Diagnoses:  Generalized Anxiety Disorder Phase of Life Change - Adult  Tennyson reports that he has been having GI issues.  It's been the third flare up this month.  We d/e/p what's been occurring, his concerns, and making a plan to address the problem.  We  also d/p what's (not) been occurring related to his request for demotion, the need to be assertive, and follow through with what he needs for his own mental health.   Hilma Favors, PhD

## 2023-09-04 ENCOUNTER — Ambulatory Visit: Payer: Federal, State, Local not specified - PPO | Admitting: Psychology

## 2023-09-18 ENCOUNTER — Ambulatory Visit: Payer: Federal, State, Local not specified - PPO | Admitting: Psychology

## 2023-09-18 DIAGNOSIS — F411 Generalized anxiety disorder: Secondary | ICD-10-CM

## 2023-09-18 NOTE — Progress Notes (Signed)
PROGRESS NOTE:  Name: Timothy [name] Date: 09/18/2023 MRN: [id number] DOB: 07-Aug-1975 PCP: Clayborn Heron, MD  Time spent: 8:02 AM - 8:59 AM  Annual Review: 03/19/2024  Today I met with  Timothy [name] in remote video (Caregility) face-to-face individual psychotherapy.  Distance Site: Client's Home Orginating Site: Dr Odette Horns Remote Office Consent: Obtained verbal consent to transmit session remotely.   Patient is aware of the limitations related to conducting virtual therapy.    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  Since the start of treatment, depression has resolved with periodic mild reoccurrences.  Anxiety however, has proven to be chronic and effects all areas of his life.  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  Unfortunately, he application for early retirement was denied and has created a new series of questions about his future work path.  Therefore, we have continued the process of discerning next steps in his career and professional development.  Timothy [name]'s mother is aging and he is beginning to see the signs of the need for additional support and negotiating way to provide the needed support and guidance.   Mental Status Exam: Appearance:   Casual and Fairly Groomed     Behavior:  Appropriate  Motor:  Normal  Speech/Language:   NA  Affect:  Appropriate  Mood:  anxious and irritable  Thought process:  normal  Thought content:    WNL  Sensory/Perceptual disturbances:    WNL  Orientation:  oriented to person, place, time/date, and situation  Attention:  Good  Concentration:  Good  Memory:  WNL  Fund of knowledge:   Good  Insight:    Good  Judgment:   Good  Impulse Control:  Good   Risk Assessment: Danger to Self:  No Self-injurious Behavior: No Danger to Others:  No Duty to Warn:no Physical Aggression / Violence:No  Access to Firearms a concern: No   Substance Abuse History: Current substance abuse: No     Past Psychiatric History:   Previous psychological history is significant for anxiety and depression Outpatient Providers: current therapist History of Psych Hospitalization: No  Psychological Testing:  n/a    Abuse History:  Victim of: No.,  n/a    Report needed: No. Victim of Neglect:No. 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  If a parent, number of children / ages: Timothy [name] (58) they/them  Support Systems:  Spouse Friends Mother and Father-In-Law Daughter  Surveyor, quantity Stress:  Yes   Income/Employment/Disability: Employment  Financial planner: No   Educational History: Education: Risk manager: N/a  Any cultural differences that may affect / interfere with treatment:  not applicable   Recreation/Hobbies: D&D, movies  Stressors: Family Conflict, Aging parent  Barriers:  None   Legal History: Pending legal issue / charges: The patient has no significant history of legal issues. History of legal issue / charges:  n/a  Medical History/Surgical History: reviewed Past Medical History:  Diagnosis Date   AKI (acute kidney injury) (HCC) 12/22/2015   Anxiety    Diabetes mellitus (HCC)    Essential hypertension    started on lisinopril about 45  days prior to pancreatitis   Obesity    Pancreatitis, acute 12/22/2015   a. 11/2015 - acute severe pancreatitis with shock/sepsis, complicated by acute respiratory failure, transaminitis, anemia of critical illness, acute encephalopathy, AKI (Cr 1.36), protein-calorie malnutrition with severe hypoalbuminemia, & hypokalemia.   Splenic vein thrombosis     Past Surgical History:  Procedure Laterality Date   BACK SURGERY     ERCP N/A 01/31/2016   Procedure: ENDOSCOPIC RETROGRADE CHOLANGIOPANCREATOGRAPHY (ERCP);  Surgeon: Jeani Hawking, MD;  Location: [facility] END[facility]COPY;  Service: Endoscopy;  Laterality: N/A;    Medications: Current Outpatient Medications  Medication Sig Dispense Refill   acetaminophen (TYLENOL) 325 MG tablet Take 650 mg by mouth every 6 (six) hours as needed for fever.     albuterol (PROVENTIL HFA;VENTOLIN HFA) 108 (90 Base) MCG/ACT inhaler Inhale 1-2 puffs into the lungs every 6 (six) hours as needed for wheezing or shortness of breath. 1 Inhaler 0   blood glucose meter kit and supplies Dispense based on patient and insurance preference. Use up to four times daily as directed. (FOR ICD-9 250.00, 250.01). 1 each 0   carvedilol (COREG) 25 MG tablet Take 1 tablet (25 mg total) by mouth 2 (two) times daily. 180 tablet 3   cetirizine (ZYRTEC) 10 MG tablet Take 10 mg by mouth at bedtime.     fluticasone (FLONASE) 50 MCG/ACT nasal spray Place 1 spray into both nostrils at bedtime.     guaiFENesin-codeine 100-10 MG/5ML syrup Take 5 mLs by mouth at bedtime as needed for cough. 120 mL 0   HYDROmorphone (DILAUDID) 1 MG/ML injection Inject 1 mL (1 mg total) into the vein every 4 (four) hours as needed for severe pain. 1 mL 0   imipenem-cilastatin 500 mg in sodium chloride 0.9 % 100 mL Inject 500 mg into the vein every 6 (six) hours.     insulin aspart (NOVOLOG) 100 UNIT/ML FlexPen 8 units with meals (as long as eating 50%) PLUS the following scale: CBG 70 - 120: 0 units CBG 121 - 150: 2 units CBG  151 - 200: 3 units CBG 201 - 250: 5 units CBG 251 - 300: 8 units CBG 301 - 350: 11 units CBG 351 - 400: 15 units (Patient taking differently: Inject 8 Units into the skin 3 (three) times daily with meals. 8 units with meals (as long as eating 50%) PLUS the following scale: CBG 70 - 120: 0 units CBG 121 - 150: 2 units CBG 151 - 200: 3 units CBG 201 - 250: 5 units CBG 251 - 300: 8 units CBG 301 - 350: 11 units CBG 351 - 400: 15 units) 60 mL 11   Insulin Glargine (LANTUS SOLOSTAR) 100 UNIT/ML Solostar Pen Inject 25 Units into the skin daily at 10 pm. 15 mL 11   Insulin Pen Needle 31G X 5 MM MISC For insulin injections 100 each 1   pantoprazole (PROTONIX) 40 MG tablet Take 1 tablet (40 mg total) by mouth at bedtime. 30 tablet 0   No current facility-administered medications for this visit.    No Known Allergies  Individualized Treatment Plan                Strengths: verbal, bright, friendly  Supports: wife, daughter, parents   Goal/Needs for Treatment:  In order of importance to patient 1)  Resolve conflicted feelings, takes steps to change work situation and adapt to the new life circumstances.   2) Learn and implement coping skills that result in a reduction of anxiety and worry, and improved daily functioning.  3) Increase activities that reinforce a positive self-identity.  4) Learn and implement coping skills that result in a prevention of of relapse of depression and improve daily functioning.    Client Statement of Needs: work on pervasive issues with anxiety, assistance in making a big life change as he contemplates retiring from current position and figures out new direction   Treatment Level: Individual Biweekly Outpatient Psychotherapy  Symptoms: Autonomic hyperactivity (e.g., palpitations, shortness of breath, dry mouth, trouble swallowing, nausea, diarrhea).  Periodic periods of depressed or irritable mood.  Excessive and/or unrealistic worry that is difficult  to control occurring more days than not for at least 6 months about a number of events or activities.  Feelings of hopelessness, worthlessness, or inappropriate guilt.  Hypervigilance (e.g., feeling constantly on edge, experiencing concentration difficulties, having trouble falling or staying asleep, exhibiting a general state of irritability).  Low self-esteem.  Behavioral passivity, difficulties initiating action Motor tension (e.g., restlessness, tiredness, shakiness, muscle tension).  Restlessness and feelings of lost identity and meaning due to retirement.   Client Treatment Preferences: continue with current therapist   Healthcare consumer's goal for treatment:  Psychologist, Hilma Favors, Ph.D. will support the patient's ability to achieve the goals identified. Cognitive Behavioral Therapy, Dialectical Behavioral Therapy, Motivational Interviewing, Behavior Activation, parent skills, and other evidenced-based practices will be used to promote progress towards healthy functioning.   Healthcare consumer Timothy [name] will: Actively participate in therapy, working towards healthy functioning.    *Justification for Continuation/Discontinuation of Goal: R=Revised, O=Ongoing, A=Achieved, D=Discontinued  Goal 1) Resolve conflicted feelings, takes steps to change work situation and adapt to the new life circumstances.   5 Point Likert rating baseline date: 02/22/2021 Target Date Goal Was reviewed Status Code Progress towards goal/Likert rating  03/20/2023 03/07/2022          O 2/5 - pt has taken initial steps but at times falls into passivity and has made little progress  03/19/2024 03/20/2023          O 4/5 - pt has taken steps to move forward with early retirement, but now is challenged w/ a denial & the p/ begins again        Goal 2) Learn and implement coping skills that result in a reduction of anxiety and worry, and improved daily functioning.  5 Point Likert rating baseline date:  02/23/2015 Target Date Goal Was reviewed Status Code Progress towards goal/Likert rating  03/20/2023 03/07/2022          O 3/5 - pt has learned coping skills and uses them appropriately but inconsistently  03/19/2024 03/20/2023          O 3.5 /5 - pt is better at identifying when he is anxious & more consistently uses his skills        Goal 3) Increase activities that reinforce a positive self-identity.   5 Point Likert rating baseline date: 02/23/2015 Target Date Goal Was reviewed Status Code Progress towards goal/Likert rating  03/20/2023 03/07/2022  O 2/5 - pt has taken initial steps to introduce new activities that have improved his feelings of positive self esteem  03/19/2024 03/20/2023          O 2.5/5 - pt has not taken any new steps but has recognized the need to do more & is making plans        Goal 4) Learn and implement coping skills that result in a prevention of of relapse of depression and improve  daily functioning.   5  Point Likert rating baseline date: 02/23/2015 Target Date Goal Was reviewed Status Code Progress towards goal/Likert rating   03/20/2023  03/07/2022            O  4/5 - pt has learned coping skills and uses them appropriately but inconsistently   03/19/2024  03/20/2023            O  3/5 - pt has regressed & has slipped back into old patterns which has effected his mood & fnxg         This plan has been reviewed and created by the following participants:  This plan will be reviewed at least every 12 months. Date Behavioral Health Clinician Date Guardian/Patient   03/07/2022  Hilma Favors, Ph.D.   03/07/2022 Timothy [name]  03/20/2023  Hilma Favors, Ph.D.  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  We d/e/p his concerns, untangling the implications of recent changes for his retirement, and   next steps.  I encouraged him to reach out to his director and get clarification on her response to his email.     Hilma Favors, PhD

## 2023-10-02 ENCOUNTER — Ambulatory Visit: Payer: Federal, State, Local not specified - PPO | Admitting: Psychology

## 2023-10-02 DIAGNOSIS — F411 Generalized anxiety disorder: Secondary | ICD-10-CM

## 2023-10-02 DIAGNOSIS — Z600 Problems of adjustment to life-cycle transitions: Secondary | ICD-10-CM | POA: Diagnosis not present

## 2023-10-02 NOTE — Progress Notes (Signed)
 PROGRESS NOTE:  Name: Timothy [name] Date: 10/02/2023 MRN: [id number] DOB: 23-Dec-1975 PCP: Clayborn Heron, MD  Time spent: 8:02 AM - 8:59 AM  Annual Review: 03/19/2024  Today I met with  [name] in remote video (Caregility) face-to-face individual psychotherapy.  Distance Site: Client's Home Orginating Site: Dr Odette Horns Remote Office Consent: Obtained verbal consent to transmit session remotely.   Patient is aware of the limitations related to conducting virtual therapy.    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  Since the start of treatment, depression has resolved with periodic mild reoccurrences.  Anxiety however, has proven to be chronic and effects all areas of his life.  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  Unfortunately, he application for early retirement was denied and has created a new series of questions about his future work path.  Therefore, we have continued the process of discerning next steps in his career and professional development.  Timothy [name]'s mother is aging and he is beginning to see the signs of the need for additional support and negotiating way to provide the needed support and guidance.   Mental Status Exam: Appearance:   Casual and Fairly Groomed     Behavior:  Appropriate  Motor:  Normal  Speech/Language:   NA  Affect:  Appropriate  Mood:  anxious and irritable  Thought process:  normal  Thought content:    WNL  Sensory/Perceptual disturbances:    WNL  Orientation:  oriented to person, place, time/date, and situation  Attention:  Good  Concentration:  Good  Memory:  WNL  Fund of knowledge:   Good  Insight:    Good  Judgment:   Good  Impulse Control:  Good   Risk Assessment: Danger to Self:  No Self-injurious Behavior: No Danger to Others:  No Duty to Warn:no Physical Aggression / Violence:No  Access to Firearms a concern: No   Substance Abuse History: Current substance abuse: No     Past Psychiatric History:   Previous psychological history is significant for anxiety and depression Outpatient Providers: current therapist History of Psych Hospitalization: No  Psychological Testing:  n/a    Abuse History:  Victim of: No.,  n/a    Report needed: No. Victim of Neglect:No. 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  If a parent, number of children / ages: [name] (4) they/them  Support Systems:  Spouse Friends Mother and Father-In-Law Daughter  Surveyor, quantity Stress:  Yes   Income/Employment/Disability: Employment  Financial planner: No   Educational History: Education: Risk manager: N/a  Any cultural differences that may affect / interfere with treatment:  not applicable   Recreation/Hobbies: D&D, movies  Stressors: Family Conflict, Aging parent  Barriers:  None   Legal History: Pending legal issue / charges: The patient has no significant history of legal issues. History of legal issue / charges:  n/a  Medical History/Surgical History: reviewed Past Medical History:  Diagnosis Date   AKI (acute kidney injury) (HCC) 12/22/2015   Anxiety    Diabetes mellitus (HCC)    Essential hypertension    started on lisinopril about 45  days prior to pancreatitis   Obesity    Pancreatitis, acute 12/22/2015   a. 11/2015 - acute severe pancreatitis with shock/sepsis, complicated by acute respiratory failure, transaminitis, anemia of critical illness, acute encephalopathy, AKI (Cr 1.36), protein-calorie malnutrition with severe hypoalbuminemia, & hypokalemia.   Splenic vein thrombosis     Past Surgical History:  Procedure Laterality Date   BACK SURGERY     ERCP N/A 01/31/2016   Procedure: ENDOSCOPIC RETROGRADE CHOLANGIOPANCREATOGRAPHY (ERCP);  Surgeon: Jeani Hawking, MD;  Location: [facility] ENDOSCOPY;  Service: Endoscopy;  Laterality: N/A;    Medications: Current Outpatient Medications  Medication Sig Dispense Refill   acetaminophen (TYLENOL) 325 MG tablet Take 650 mg by mouth every 6 (six) hours as needed for fever.     albuterol (PROVENTIL HFA;VENTOLIN HFA) 108 (90 Base) MCG/ACT inhaler Inhale 1-2 puffs into the lungs every 6 (six) hours as needed for wheezing or shortness of breath. 1 Inhaler 0   blood glucose meter kit and supplies Dispense based on patient and insurance preference. Use up to four times daily as directed. (FOR ICD-9 250.00, 250.01). 1 each 0   carvedilol (COREG) 25 MG tablet Take 1 tablet (25 mg total) by mouth 2 (two) times daily. 180 tablet 3   cetirizine (ZYRTEC) 10 MG tablet Take 10 mg by mouth at bedtime.     fluticasone (FLONASE) 50 MCG/ACT nasal spray Place 1 spray into both nostrils at bedtime.     guaiFENesin-codeine 100-10 MG/5ML syrup Take 5 mLs by mouth at bedtime as needed for cough. 120 mL 0   HYDROmorphone (DILAUDID) 1 MG/ML injection Inject 1 mL (1 mg total) into the vein every 4 (four) hours as needed for severe pain. 1 mL 0   imipenem-cilastatin 500 mg in sodium chloride 0.9 % 100 mL Inject 500 mg into the vein every 6 (six) hours.     insulin aspart (NOVOLOG) 100 UNIT/ML FlexPen 8 units with meals (as long as eating 50%) PLUS the following scale: CBG 70 - 120: 0 units CBG 121 - 150: 2 units CBG  151 - 200: 3 units CBG 201 - 250: 5 units CBG 251 - 300: 8 units CBG 301 - 350: 11 units CBG 351 - 400: 15 units (Patient taking differently: Inject 8 Units into the skin 3 (three) times daily with meals. 8 units with meals (as long as eating 50%) PLUS the following scale: CBG 70 - 120: 0 units CBG 121 - 150: 2 units CBG 151 - 200: 3 units CBG 201 - 250: 5 units CBG 251 - 300: 8 units CBG 301 - 350: 11 units CBG 351 - 400: 15 units) 60 mL 11   Insulin Glargine (LANTUS SOLOSTAR) 100 UNIT/ML Solostar Pen Inject 25 Units into the skin daily at 10 pm. 15 mL 11   Insulin Pen Needle 31G X 5 MM MISC For insulin injections 100 each 1   pantoprazole (PROTONIX) 40 MG tablet Take 1 tablet (40 mg total) by mouth at bedtime. 30 tablet 0   No current facility-administered medications for this visit.    No Known Allergies  Individualized Treatment Plan                Strengths: verbal, bright, friendly  Supports: wife, daughter, parents   Goal/Needs for Treatment:  In order of importance to patient 1)  Resolve conflicted feelings, takes steps to change work situation and adapt to the new life circumstances.   2) Learn and implement coping skills that result in a reduction of anxiety and worry, and improved daily functioning.  3) Increase activities that reinforce a positive self-identity.  4) Learn and implement coping skills that result in a prevention of of relapse of depression and improve daily functioning.    Client Statement of Needs: work on pervasive issues with anxiety, assistance in making a big life change as he contemplates retiring from current position and figures out new direction   Treatment Level: Individual Biweekly Outpatient Psychotherapy  Symptoms: Autonomic hyperactivity (e.g., palpitations, shortness of breath, dry mouth, trouble swallowing, nausea, diarrhea).  Periodic periods of depressed or irritable mood.  Excessive and/or unrealistic worry that is difficult  to control occurring more days than not for at least 6 months about a number of events or activities.  Feelings of hopelessness, worthlessness, or inappropriate guilt.  Hypervigilance (e.g., feeling constantly on edge, experiencing concentration difficulties, having trouble falling or staying asleep, exhibiting a general state of irritability).  Low self-esteem.  Behavioral passivity, difficulties initiating action Motor tension (e.g., restlessness, tiredness, shakiness, muscle tension).  Restlessness and feelings of lost identity and meaning due to retirement.   Client Treatment Preferences: continue with current therapist   Healthcare consumer's goal for treatment:  Psychologist, Hilma Favors, Ph.D. will support the patient's ability to achieve the goals identified. Cognitive Behavioral Therapy, Dialectical Behavioral Therapy, Motivational Interviewing, Behavior Activation, parent skills, and other evidenced-based practices will be used to promote progress towards healthy functioning.   Healthcare consumer Renly Roots will: Actively participate in therapy, working towards healthy functioning.    *Justification for Continuation/Discontinuation of Goal: R=Revised, O=Ongoing, A=Achieved, D=Discontinued  Goal 1) Resolve conflicted feelings, takes steps to change work situation and adapt to the new life circumstances.   5 Point Likert rating baseline date: 02/22/2021 Target Date Goal Was reviewed Status Code Progress towards goal/Likert rating  03/20/2023 03/07/2022          O 2/5 - pt has taken initial steps but at times falls into passivity and has made little progress  03/19/2024 03/20/2023          O 4/5 - pt has taken steps to move forward with early retirement, but now is challenged w/ a denial & the p/ begins again        Goal 2) Learn and implement coping skills that result in a reduction of anxiety and worry, and improved daily functioning.  5 Point Likert rating baseline date:  02/23/2015 Target Date Goal Was reviewed Status Code Progress towards goal/Likert rating  03/20/2023 03/07/2022          O 3/5 - pt has learned coping skills and uses them appropriately but inconsistently  03/19/2024 03/20/2023          O 3.5 /5 - pt is better at identifying when he is anxious & more consistently uses his skills        Goal 3) Increase activities that reinforce a positive self-identity.   5 Point Likert rating baseline date: 02/23/2015 Target Date Goal Was reviewed Status Code Progress towards goal/Likert rating  03/20/2023 03/07/2022  O 2/5 - pt has taken initial steps to introduce new activities that have improved his feelings of positive self esteem  03/19/2024 03/20/2023          O 2.5/5 - pt has not taken any new steps but has recognized the need to do more & is making plans        Goal 4) Learn and implement coping skills that result in a prevention of of relapse of depression and improve  daily functioning.   5  Point Likert rating baseline date: 02/23/2015 Target Date Goal Was reviewed Status Code Progress towards goal/Likert rating   03/20/2023  03/07/2022            O  4/5 - pt has learned coping skills and uses them appropriately but inconsistently   03/19/2024  03/20/2023            O  3/5 - pt has regressed & has slipped back into old patterns which has effected his mood & fnxg         This plan has been reviewed and created by the following participants:  This plan will be reviewed at least every 12 months. Date Behavioral Health Clinician Date Guardian/Patient   03/07/2022  Hilma Favors, Ph.D.   03/07/2022 [name]  03/20/2023  Hilma Favors, Ph.D.  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  On an up note, he had his annual visit with his PCP, and he had a good visit.  He was pleased given all the stress he's been under.  Hilma Favors, PhD

## 2023-10-16 ENCOUNTER — Ambulatory Visit: Payer: Federal, State, Local not specified - PPO | Admitting: Psychology

## 2023-10-16 DIAGNOSIS — F411 Generalized anxiety disorder: Secondary | ICD-10-CM | POA: Diagnosis not present

## 2023-10-16 DIAGNOSIS — Z600 Problems of adjustment to life-cycle transitions: Secondary | ICD-10-CM | POA: Diagnosis not present

## 2023-10-16 NOTE — Progress Notes (Signed)
 PROGRESS NOTE:  Name: Timothy [name] Date: 10/16/2023 MRN: [id number] DOB: [birth year] PCP: Clayborn Heron, MD  Time spent: 8:01 AM - 8:59 AM  Annual Review: 03/19/2024  Today I met with  Timothy [name] in remote video (Caregility) face-to-face individual psychotherapy.  Distance Site: Client's Home Orginating Site: Dr Odette Horns Remote Office Consent: Obtained verbal consent to transmit session remotely.   Patient is aware of the limitations related to conducting virtual therapy.    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  Since the start of treatment, depression has resolved with periodic mild reoccurrences.  Anxiety however, has proven to be chronic and effects all areas of his life.  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  Unfortunately, he application for early retirement was denied and has created a new series of questions about his future work path.  Therefore, we have continued the process of discerning next steps in his career and professional development.  Worthington's mother is aging and he is beginning to see the signs of the need for additional support and negotiating way to provide the needed support and guidance.   Mental Status Exam: Appearance:   Casual and Fairly Groomed     Behavior:  Appropriate  Motor:  Normal  Speech/Language:   NA  Affect:  Appropriate  Mood:  anxious and irritable  Thought process:  normal  Thought content:    WNL  Sensory/Perceptual disturbances:    WNL  Orientation:  oriented to person, place, time/date, and situation  Attention:  Good  Concentration:  Good  Memory:  WNL  Fund of knowledge:   Good  Insight:    Good  Judgment:   Good  Impulse Control:  Good   Risk Assessment: Danger to Self:  No Self-injurious Behavior: No Danger to Others:  No Duty to Warn:no Physical Aggression / Violence:No  Access to Firearms a concern: No   Substance Abuse History: Current substance abuse: No     Past Psychiatric History:   Previous psychological history is significant for anxiety and depression Outpatient Providers: current therapist History of Psych Hospitalization: No  Psychological Testing:  n/a    Abuse History:  Victim of: No.,  n/a    Report needed: No. Victim of Neglect:No. 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  If a parent, number of children / ages: Timothy [name] (61) they/them  Support Systems:  Spouse Friends Mother and Father-In-Law Daughter  Surveyor, quantity Stress:  Yes   Income/Employment/Disability: Employment  Financial planner: No   Educational History: Education: Risk manager: N/a  Any cultural differences that may affect / interfere with treatment:  not applicable   Recreation/Hobbies: D&D, movies  Stressors: Family Conflict, Aging parent  Barriers:  None   Legal History: Pending legal issue / charges: The patient has no significant history of legal issues. History of legal issue / charges:  n/a  Medical History/Surgical History: reviewed Past Medical History:  Diagnosis Date   AKI (acute kidney injury) (HCC) 12/22/2015   Anxiety    Diabetes mellitus (HCC)    Essential hypertension    started on lisinopril about 45  days prior to pancreatitis   Obesity    Pancreatitis, acute 12/22/2015   a. 11/2015 - acute severe pancreatitis with shock/sepsis, complicated by acute respiratory failure, transaminitis, anemia of critical illness, acute encephalopathy, AKI (Cr 1.36), protein-calorie malnutrition with severe hypoalbuminemia, & hypokalemia.   Splenic vein thrombosis     Past Surgical History:  Procedure Laterality Date   BACK SURGERY     ERCP N/A 01/31/2016   Procedure: ENDOSCOPIC RETROGRADE CHOLANGIOPANCREATOGRAPHY (ERCP);  Surgeon: Jeani Hawking, MD;  Location: [facility] ENDOSCOPY;  Service: Endoscopy;  Laterality: N/A;    Medications: Current Outpatient Medications  Medication Sig Dispense Refill   acetaminophen (TYLENOL) 325 MG tablet Take 650 mg by mouth every 6 (six) hours as needed for fever.     albuterol (PROVENTIL HFA;VENTOLIN HFA) 108 (90 Base) MCG/ACT inhaler Inhale 1-2 puffs into the lungs every 6 (six) hours as needed for wheezing or shortness of breath. 1 Inhaler 0   blood glucose meter kit and supplies Dispense based on patient and insurance preference. Use up to four times daily as directed. (FOR ICD-9 250.00, 250.01). 1 each 0   carvedilol (COREG) 25 MG tablet Take 1 tablet (25 mg total) by mouth 2 (two) times daily. 180 tablet 3   cetirizine (ZYRTEC) 10 MG tablet Take 10 mg by mouth at bedtime.     fluticasone (FLONASE) 50 MCG/ACT nasal spray Place 1 spray into both nostrils at bedtime.     guaiFENesin-codeine 100-10 MG/5ML syrup Take 5 mLs by mouth at bedtime as needed for cough. 120 mL 0   HYDROmorphone (DILAUDID) 1 MG/ML injection Inject 1 mL (1 mg total) into the vein every 4 (four) hours as needed for severe pain. 1 mL 0   imipenem-cilastatin 500 mg in sodium chloride 0.9 % 100 mL Inject 500 mg into the vein every 6 (six) hours.     insulin aspart (NOVOLOG) 100 UNIT/ML FlexPen 8 units with meals (as long as eating 50%) PLUS the following scale: CBG 70 - 120: 0 units CBG 121 - 150: 2 units CBG  151 - 200: 3 units CBG 201 - 250: 5 units CBG 251 - 300: 8 units CBG 301 - 350: 11 units CBG 351 - 400: 15 units (Patient taking differently: Inject 8 Units into the skin 3 (three) times daily with meals. 8 units with meals (as long as eating 50%) PLUS the following scale: CBG 70 - 120: 0 units CBG 121 - 150: 2 units CBG 151 - 200: 3 units CBG 201 - 250: 5 units CBG 251 - 300: 8 units CBG 301 - 350: 11 units CBG 351 - 400: 15 units) 60 mL 11   Insulin Glargine (LANTUS SOLOSTAR) 100 UNIT/ML Solostar Pen Inject 25 Units into the skin daily at 10 pm. 15 mL 11   Insulin Pen Needle 31G X 5 MM MISC For insulin injections 100 each 1   pantoprazole (PROTONIX) 40 MG tablet Take 1 tablet (40 mg total) by mouth at bedtime. 30 tablet 0   No current facility-administered medications for this visit.    No Known Allergies  Individualized Treatment Plan                Strengths: verbal, bright, friendly  Supports: wife, daughter, parents   Goal/Needs for Treatment:  In order of importance to patient 1)  Resolve conflicted feelings, takes steps to change work situation and adapt to the new life circumstances.   2) Learn and implement coping skills that result in a reduction of anxiety and worry, and improved daily functioning.  3) Increase activities that reinforce a positive self-identity.  4) Learn and implement coping skills that result in a prevention of of relapse of depression and improve daily functioning.    Client Statement of Needs: work on pervasive issues with anxiety, assistance in making a big life change as he contemplates retiring from current position and figures out new direction   Treatment Level: Individual Biweekly Outpatient Psychotherapy  Symptoms: Autonomic hyperactivity (e.g., palpitations, shortness of breath, dry mouth, trouble swallowing, nausea, diarrhea).  Periodic periods of depressed or irritable mood.  Excessive and/or unrealistic worry that is difficult  to control occurring more days than not for at least 6 months about a number of events or activities.  Feelings of hopelessness, worthlessness, or inappropriate guilt.  Hypervigilance (e.g., feeling constantly on edge, experiencing concentration difficulties, having trouble falling or staying asleep, exhibiting a general state of irritability).  Low self-esteem.  Behavioral passivity, difficulties initiating action Motor tension (e.g., restlessness, tiredness, shakiness, muscle tension).  Restlessness and feelings of lost identity and meaning due to retirement.   Client Treatment Preferences: continue with current therapist   Healthcare consumer's goal for treatment:  Psychologist, Hilma Favors, Ph.D. will support the patient's ability to achieve the goals identified. Cognitive Behavioral Therapy, Dialectical Behavioral Therapy, Motivational Interviewing, Behavior Activation, parent skills, and other evidenced-based practices will be used to promote progress towards healthy functioning.   Healthcare consumer Noor Vidales will: Actively participate in therapy, working towards healthy functioning.    *Justification for Continuation/Discontinuation of Goal: R=Revised, O=Ongoing, A=Achieved, D=Discontinued  Goal 1) Resolve conflicted feelings, takes steps to change work situation and adapt to the new life circumstances.   5 Point Likert rating baseline date: 02/22/2021 Target Date Goal Was reviewed Status Code Progress towards goal/Likert rating  03/20/2023 03/07/2022          O 2/5 - pt has taken initial steps but at times falls into passivity and has made little progress  03/19/2024 03/20/2023          O 4/5 - pt has taken steps to move forward with early retirement, but now is challenged w/ a denial & the p/ begins again        Goal 2) Learn and implement coping skills that result in a reduction of anxiety and worry, and improved daily functioning.  5 Point Likert rating baseline date:  02/23/2015 Target Date Goal Was reviewed Status Code Progress towards goal/Likert rating  03/20/2023 03/07/2022          O 3/5 - pt has learned coping skills and uses them appropriately but inconsistently  03/19/2024 03/20/2023          O 3.5 /5 - pt is better at identifying when he is anxious & more consistently uses his skills        Goal 3) Increase activities that reinforce a positive self-identity.   5 Point Likert rating baseline date: 02/23/2015 Target Date Goal Was reviewed Status Code Progress towards goal/Likert rating  03/20/2023 03/07/2022  O 2/5 - pt has taken initial steps to introduce new activities that have improved his feelings of positive self esteem  03/19/2024 03/20/2023          O 2.5/5 - pt has not taken any new steps but has recognized the need to do more & is making plans        Goal 4) Learn and implement coping skills that result in a prevention of of relapse of depression and improve  daily functioning.   5  Point Likert rating baseline date: 02/23/2015 Target Date Goal Was reviewed Status Code Progress towards goal/Likert rating   03/20/2023  03/07/2022            O  4/5 - pt has learned coping skills and uses them appropriately but inconsistently   03/19/2024  03/20/2023            O  3/5 - pt has regressed & has slipped back into old patterns which has effected his mood & fnxg         This plan has been reviewed and created by the following participants:  This plan will be reviewed at least every 12 months. Date Behavioral Health Clinician Date Guardian/Patient   03/07/2022  Hilma Favors, Ph.D.   03/07/2022 Timothy [name]  03/20/2023  Hilma Favors, Ph.D.  03/20/2023 Timothy [name]               Diagnoses:  Generalized Anxiety Disorder Phase of Life Change - Adult  Kymir reports that he submitted his early out paperwork this weekend.  We d/p his thoughts, anxieties, ways to manage his anxieties, and implications for the future.  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    Home Practice:    Hilma Favors, PhD

## 2023-10-30 ENCOUNTER — Ambulatory Visit (INDEPENDENT_AMBULATORY_CARE_PROVIDER_SITE_OTHER): Payer: Federal, State, Local not specified - PPO | Admitting: Psychology

## 2023-10-30 DIAGNOSIS — F411 Generalized anxiety disorder: Secondary | ICD-10-CM | POA: Diagnosis not present

## 2023-10-30 DIAGNOSIS — Z600 Problems of adjustment to life-cycle transitions: Secondary | ICD-10-CM

## 2023-10-30 NOTE — Progress Notes (Signed)
 PROGRESS NOTE:  Name: Timothy [name] Date: 10/30/2023 MRN: [id number] DOB: 1976-06-26 PCP: Clayborn Heron, MD  Time spent: 8:01 AM - 8:59 AM  Annual Review: 03/19/2024  Today I met with  Timothy [name] in remote video (Caregility) face-to-face individual psychotherapy.  Distance Site: Client's Home Orginating Site: Dr Odette Horns Remote Office Consent: Obtained verbal consent to transmit session remotely.   Patient is aware of the limitations related to conducting virtual therapy.    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  Since the start of treatment, depression has resolved with periodic mild reoccurrences.  Anxiety however, has proven to be chronic and effects all areas of his life.  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  Unfortunately, he application for early retirement was denied and has created a new series of questions about his future work path.  Therefore, we have continued the process of discerning next steps in his career and professional development.  Timothy [name]'s mother is aging and he is beginning to see the signs of the need for additional support and negotiating way to provide the needed support and guidance.   Mental Status Exam: Appearance:   Casual and Fairly Groomed     Behavior:  Appropriate  Motor:  Normal  Speech/Language:   NA  Affect:  Appropriate  Mood:  anxious and irritable  Thought process:  normal  Thought content:    WNL  Sensory/Perceptual disturbances:    WNL  Orientation:  oriented to person, place, time/date, and situation  Attention:  Good  Concentration:  Good  Memory:  WNL  Fund of knowledge:   Good  Insight:    Good  Judgment:   Good  Impulse Control:  Good   Risk Assessment: Danger to Self:  No Self-injurious Behavior: No Danger to Others:  No Duty to Warn:no Physical Aggression / Violence:No  Access to Firearms a concern: No   Substance Abuse History: Current substance abuse: No     Past Psychiatric History:   Previous psychological history is significant for anxiety and depression Outpatient Providers: current therapist History of Psych Hospitalization: No  Psychological Testing:  n/a    Abuse History:  Victim of: No.,  n/a    Report needed: No. Victim of Neglect:No. 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  If a parent, number of children / ages: [name] (63) they/them  Support Systems:  Spouse Friends Mother and Father-In-Law Daughter  Surveyor, quantity Stress:  Yes   Income/Employment/Disability: Employment  Financial planner: No   Educational History: Education: Risk manager: N/a  Any cultural differences that may affect / interfere with treatment:  not applicable   Recreation/Hobbies: D&D, movies  Stressors: Family Conflict, Aging parent  Barriers:  None   Legal History: Pending legal issue / charges: The patient has no significant history of legal issues. History of legal issue / charges:  n/a  Medical History/Surgical History: reviewed Past Medical History:  Diagnosis Date   AKI (acute kidney injury) (HCC) 12/22/2015   Anxiety    Diabetes mellitus (HCC)    Essential hypertension    started on lisinopril about 45  days prior to pancreatitis   Obesity    Pancreatitis, acute 12/22/2015   a. 11/2015 - acute severe pancreatitis with shock/sepsis, complicated by acute respiratory failure, transaminitis, anemia of critical illness, acute encephalopathy, AKI (Cr 1.36), protein-calorie malnutrition with severe hypoalbuminemia, & hypokalemia.   Splenic vein thrombosis     Past Surgical History:  Procedure Laterality Date   BACK SURGERY     ERCP N/A 01/31/2016   Procedure: ENDOSCOPIC RETROGRADE CHOLANGIOPANCREATOGRAPHY (ERCP);  Surgeon: Jeani Hawking, MD;  Location: [facility] ENDOSCOPY;  Service: Endoscopy;  Laterality: N/A;    Medications: Current Outpatient Medications  Medication Sig Dispense Refill   acetaminophen (TYLENOL) 325 MG tablet Take 650 mg by mouth every 6 (six) hours as needed for fever.     albuterol (PROVENTIL HFA;VENTOLIN HFA) 108 (90 Base) MCG/ACT inhaler Inhale 1-2 puffs into the lungs every 6 (six) hours as needed for wheezing or shortness of breath. 1 Inhaler 0   blood glucose meter kit and supplies Dispense based on patient and insurance preference. Use up to four times daily as directed. (FOR ICD-9 250.00, 250.01). 1 each 0   carvedilol (COREG) 25 MG tablet Take 1 tablet (25 mg total) by mouth 2 (two) times daily. 180 tablet 3   cetirizine (ZYRTEC) 10 MG tablet Take 10 mg by mouth at bedtime.     fluticasone (FLONASE) 50 MCG/ACT nasal spray Place 1 spray into both nostrils at bedtime.     guaiFENesin-codeine 100-10 MG/5ML syrup Take 5 mLs by mouth at bedtime as needed for cough. 120 mL 0   HYDROmorphone (DILAUDID) 1 MG/ML injection Inject 1 mL (1 mg total) into the vein every 4 (four) hours as needed for severe pain. 1 mL 0   imipenem-cilastatin 500 mg in sodium chloride 0.9 % 100 mL Inject 500 mg into the vein every 6 (six) hours.     insulin aspart (NOVOLOG) 100 UNIT/ML FlexPen 8 units with meals (as long as eating 50%) PLUS the following scale: CBG 70 - 120: 0 units CBG 121 - 150: 2 units CBG  151 - 200: 3 units CBG 201 - 250: 5 units CBG 251 - 300: 8 units CBG 301 - 350: 11 units CBG 351 - 400: 15 units (Patient taking differently: Inject 8 Units into the skin 3 (three) times daily with meals. 8 units with meals (as long as eating 50%) PLUS the following scale: CBG 70 - 120: 0 units CBG 121 - 150: 2 units CBG 151 - 200: 3 units CBG 201 - 250: 5 units CBG 251 - 300: 8 units CBG 301 - 350: 11 units CBG 351 - 400: 15 units) 60 mL 11   Insulin Glargine (LANTUS SOLOSTAR) 100 UNIT/ML Solostar Pen Inject 25 Units into the skin daily at 10 pm. 15 mL 11   Insulin Pen Needle 31G X 5 MM MISC For insulin injections 100 each 1   pantoprazole (PROTONIX) 40 MG tablet Take 1 tablet (40 mg total) by mouth at bedtime. 30 tablet 0   No current facility-administered medications for this visit.    No Known Allergies  Individualized Treatment Plan                Strengths: verbal, bright, friendly  Supports: wife, daughter, parents   Goal/Needs for Treatment:  In order of importance to patient 1)  Resolve conflicted feelings, takes steps to change work situation and adapt to the new life circumstances.   2) Learn and implement coping skills that result in a reduction of anxiety and worry, and improved daily functioning.  3) Increase activities that reinforce a positive self-identity.  4) Learn and implement coping skills that result in a prevention of of relapse of depression and improve daily functioning.    Client Statement of Needs: work on pervasive issues with anxiety, assistance in making a big life change as he contemplates retiring from current position and figures out new direction   Treatment Level: Individual Biweekly Outpatient Psychotherapy  Symptoms: Autonomic hyperactivity (e.g., palpitations, shortness of breath, dry mouth, trouble swallowing, nausea, diarrhea).  Periodic periods of depressed or irritable mood.  Excessive and/or unrealistic worry that is difficult  to control occurring more days than not for at least 6 months about a number of events or activities.  Feelings of hopelessness, worthlessness, or inappropriate guilt.  Hypervigilance (e.g., feeling constantly on edge, experiencing concentration difficulties, having trouble falling or staying asleep, exhibiting a general state of irritability).  Low self-esteem.  Behavioral passivity, difficulties initiating action Motor tension (e.g., restlessness, tiredness, shakiness, muscle tension).  Restlessness and feelings of lost identity and meaning due to retirement.   Client Treatment Preferences: continue with current therapist   Healthcare consumer's goal for treatment:  Psychologist, Hilma Favors, Ph.D. will support the patient's ability to achieve the goals identified. Cognitive Behavioral Therapy, Dialectical Behavioral Therapy, Motivational Interviewing, Behavior Activation, parent skills, and other evidenced-based practices will be used to promote progress towards healthy functioning.   Healthcare consumer Champion Corales will: Actively participate in therapy, working towards healthy functioning.    *Justification for Continuation/Discontinuation of Goal: R=Revised, O=Ongoing, A=Achieved, D=Discontinued  Goal 1) Resolve conflicted feelings, takes steps to change work situation and adapt to the new life circumstances.   5 Point Likert rating baseline date: 02/22/2021 Target Date Goal Was reviewed Status Code Progress towards goal/Likert rating  03/20/2023 03/07/2022          O 2/5 - pt has taken initial steps but at times falls into passivity and has made little progress  03/19/2024 03/20/2023          O 4/5 - pt has taken steps to move forward with early retirement, but now is challenged w/ a denial & the p/ begins again        Goal 2) Learn and implement coping skills that result in a reduction of anxiety and worry, and improved daily functioning.  5 Point Likert rating baseline date:  02/23/2015 Target Date Goal Was reviewed Status Code Progress towards goal/Likert rating  03/20/2023 03/07/2022          O 3/5 - pt has learned coping skills and uses them appropriately but inconsistently  03/19/2024 03/20/2023          O 3.5 /5 - pt is better at identifying when he is anxious & more consistently uses his skills        Goal 3) Increase activities that reinforce a positive self-identity.   5 Point Likert rating baseline date: 02/23/2015 Target Date Goal Was reviewed Status Code Progress towards goal/Likert rating  03/20/2023 03/07/2022  O 2/5 - pt has taken initial steps to introduce new activities that have improved his feelings of positive self esteem  03/19/2024 03/20/2023          O 2.5/5 - pt has not taken any new steps but has recognized the need to do more & is making plans        Goal 4) Learn and implement coping skills that result in a prevention of of relapse of depression and improve  daily functioning.   5  Point Likert rating baseline date: 02/23/2015 Target Date Goal Was reviewed Status Code Progress towards goal/Likert rating   03/20/2023  03/07/2022            O  4/5 - pt has learned coping skills and uses them appropriately but inconsistently   03/19/2024  03/20/2023            O  3/5 - pt has regressed & has slipped back into old patterns which has effected his mood & fnxg         This plan has been reviewed and created by the following participants:  This plan will be reviewed at least every 12 months. Date Behavioral Health Clinician Date Guardian/Patient   03/07/2022  Hilma Favors, Ph.D.   03/07/2022 Timothy [name]  03/20/2023  Hilma Favors, Ph.D.  03/20/2023 Timothy [name]               Diagnoses:  Generalized Anxiety Disorder Phase of Life Change - Adult  Drako reports that he interviewed with [employer], and he didn't get the job.  We d/p his great disappointment, anxieties about the future,  and next steps.  We also d/p that his mother  is in the midst of evaluation and possible treatment for cancer, how he is feeling and how can offer support from afar.   Home Practice:    Hilma Favors, PhD

## 2023-11-13 ENCOUNTER — Ambulatory Visit (INDEPENDENT_AMBULATORY_CARE_PROVIDER_SITE_OTHER): Payer: Federal, State, Local not specified - PPO | Admitting: Psychology

## 2023-11-13 DIAGNOSIS — Z600 Problems of adjustment to life-cycle transitions: Secondary | ICD-10-CM | POA: Diagnosis not present

## 2023-11-13 DIAGNOSIS — F411 Generalized anxiety disorder: Secondary | ICD-10-CM

## 2023-11-13 NOTE — Progress Notes (Signed)
 PROGRESS NOTE:  Name: Timothy [name] Date: 11/13/2023 MRN: [id number] DOB: 1976-04-21 PCP: Clayborn Heron, MD  Time spent: 8:01 AM - 8:58 AM  Annual Review: 03/19/2024  Today I met with  Timothy [name] in remote video ([facility]) face-to-face individual psychotherapy.  Distance Site: Client's Home Orginating Site: Dr Odette Horns Remote Office Consent: Obtained verbal consent to transmit session remotely.   Patient is aware of the limitations related to conducting virtual therapy.    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  Since the start of treatment, depression has resolved with periodic mild reoccurrences.  Anxiety however, has proven to be chronic and effects all areas of his life.  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  Unfortunately, he application for early retirement was denied and has created a new series of questions about his future work path.  Therefore, we have continued the process of discerning next steps in his career and professional development.  Timothy [name]'s mother is aging and he is beginning to see the signs of the need for additional support and negotiating way to provide the needed support and guidance.   Mental Status Exam: Appearance:   Casual and Fairly Groomed     Behavior:  Appropriate  Motor:  Normal  Speech/Language:   NA  Affect:  Appropriate  Mood:  anxious and irritable  Thought process:  normal  Thought content:    WNL  Sensory/Perceptual disturbances:    WNL  Orientation:  oriented to person, place, time/date, and situation  Attention:  Good  Concentration:  Good  Memory:  WNL  Fund of knowledge:   Good  Insight:    Good  Judgment:   Good  Impulse Control:  Good   Risk Assessment: Danger to Self:  No Self-injurious Behavior: No Danger to Others:  No Duty to Warn:no Physical Aggression / Violence:No  Access to Firearms a concern: No   Substance Abuse History: Current substance abuse: No     Past Psychiatric History:   Previous psychological history is significant for anxiety and depression Outpatient Providers: current therapist History of Psych Hospitalization: No  Psychological Testing:  n/a    Abuse History:  Victim of: No.,  n/a    Report needed: No. Victim of Neglect:No. 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  If a parent, number of children / ages: [name] (34) they/them  Support Systems:  Spouse Friends Mother and Father-In-Law Daughter  Surveyor, quantity Stress:  Yes   Income/Employment/Disability: Employment  Financial planner: No   Educational History: Education: Risk manager: N/a  Any cultural differences that may affect / interfere with treatment:  not applicable   Recreation/Hobbies: D&D, movies  Stressors: Family Conflict, Aging parent  Barriers:  None   Legal History: Pending legal issue / charges: The patient has no significant history of legal issues. History of legal issue / charges:  n/a  Medical History/Surgical History: reviewed Past Medical History:  Diagnosis Date   AKI (acute kidney injury) (HCC) 12/22/2015   Anxiety    Diabetes mellitus (HCC)    Essential hypertension    started on lisinopril about 45  days prior to pancreatitis   Obesity    Pancreatitis, acute 12/22/2015   a. 11/2015 - acute severe pancreatitis with shock/sepsis, complicated by acute respiratory failure, transaminitis, anemia of critical illness, acute encephalopathy, AKI (Cr 1.36), protein-calorie malnutrition with severe hypoalbuminemia, & hypokalemia.   Splenic vein thrombosis     Past Surgical History:  Procedure Laterality Date   BACK SURGERY     ERCP N/A 01/31/2016   Procedure: ENDOSCOPIC RETROGRADE CHOLANGIOPANCREATOGRAPHY (ERCP);  Surgeon: Jeani Hawking, MD;  Location: [facility] ENDOSCOPY;  Service: Endoscopy;  Laterality: N/A;    Medications: Current Outpatient Medications  Medication Sig Dispense Refill   acetaminophen (TYLENOL) 325 MG tablet Take 650 mg by mouth every 6 (six) hours as needed for fever.     albuterol (PROVENTIL HFA;VENTOLIN HFA) 108 (90 Base) MCG/ACT inhaler Inhale 1-2 puffs into the lungs every 6 (six) hours as needed for wheezing or shortness of breath. 1 Inhaler 0   blood glucose meter kit and supplies Dispense based on patient and insurance preference. Use up to four times daily as directed. (FOR ICD-9 250.00, 250.01). 1 each 0   carvedilol (COREG) 25 MG tablet Take 1 tablet (25 mg total) by mouth 2 (two) times daily. 180 tablet 3   cetirizine (ZYRTEC) 10 MG tablet Take 10 mg by mouth at bedtime.     fluticasone (FLONASE) 50 MCG/ACT nasal spray Place 1 spray into both nostrils at bedtime.     guaiFENesin-codeine 100-10 MG/5ML syrup Take 5 mLs by mouth at bedtime as needed for cough. 120 mL 0   HYDROmorphone (DILAUDID) 1 MG/ML injection Inject 1 mL (1 mg total) into the vein every 4 (four) hours as needed for severe pain. 1 mL 0   imipenem-cilastatin 500 mg in sodium chloride 0.9 % 100 mL Inject 500 mg into the vein every 6 (six) hours.     insulin aspart (NOVOLOG) 100 UNIT/ML FlexPen 8 units with meals (as long as eating 50%) PLUS the following scale: CBG 70 - 120: 0 units CBG 121 - 150: 2 units CBG  151 - 200: 3 units CBG 201 - 250: 5 units CBG 251 - 300: 8 units CBG 301 - 350: 11 units CBG 351 - 400: 15 units (Patient taking differently: Inject 8 Units into the skin 3 (three) times daily with meals. 8 units with meals (as long as eating 50%) PLUS the following scale: CBG 70 - 120: 0 units CBG 121 - 150: 2 units CBG 151 - 200: 3 units CBG 201 - 250: 5 units CBG 251 - 300: 8 units CBG 301 - 350: 11 units CBG 351 - 400: 15 units) 60 mL 11   Insulin Glargine (LANTUS SOLOSTAR) 100 UNIT/ML Solostar Pen Inject 25 Units into the skin daily at 10 pm. 15 mL 11   Insulin Pen Needle 31G X 5 MM MISC For insulin injections 100 each 1   pantoprazole (PROTONIX) 40 MG tablet Take 1 tablet (40 mg total) by mouth at bedtime. 30 tablet 0   No current facility-administered medications for this visit.    No Known Allergies  Individualized Treatment Plan                Strengths: verbal, bright, friendly  Supports: wife, daughter, parents   Goal/Needs for Treatment:  In order of importance to patient 1)  Resolve conflicted feelings, takes steps to change work situation and adapt to the new life circumstances.   2) Learn and implement coping skills that result in a reduction of anxiety and worry, and improved daily functioning.  3) Increase activities that reinforce a positive self-identity.  4) Learn and implement coping skills that result in a prevention of of relapse of depression and improve daily functioning.    Client Statement of Needs: work on pervasive issues with anxiety, assistance in making a big life change as he contemplates retiring from current position and figures out new direction   Treatment Level: Individual Biweekly Outpatient Psychotherapy  Symptoms: Autonomic hyperactivity (e.g., palpitations, shortness of breath, dry mouth, trouble swallowing, nausea, diarrhea).  Periodic periods of depressed or irritable mood.  Excessive and/or unrealistic worry that is difficult  to control occurring more days than not for at least 6 months about a number of events or activities.  Feelings of hopelessness, worthlessness, or inappropriate guilt.  Hypervigilance (e.g., feeling constantly on edge, experiencing concentration difficulties, having trouble falling or staying asleep, exhibiting a general state of irritability).  Low self-esteem.  Behavioral passivity, difficulties initiating action Motor tension (e.g., restlessness, tiredness, shakiness, muscle tension).  Restlessness and feelings of lost identity and meaning due to retirement.   Client Treatment Preferences: continue with current therapist   Healthcare consumer's goal for treatment:  Psychologist, Timothy [name], Ph.D. will support the patient's ability to achieve the goals identified. Cognitive Behavioral Therapy, Dialectical Behavioral Therapy, Motivational Interviewing, Behavior Activation, parent skills, and other evidenced-based practices will be used to promote progress towards healthy functioning.   Healthcare consumer Timothy [name] will: Actively participate in therapy, working towards healthy functioning.    *Justification for Continuation/Discontinuation of Goal: R=Revised, O=Ongoing, A=Achieved, D=Discontinued  Goal 1) Resolve conflicted feelings, takes steps to change work situation and adapt to the new life circumstances.   5 Point Likert rating baseline date: 02/22/2021 Target Date Goal Was reviewed Status Code Progress towards goal/Likert rating  03/20/2023 03/07/2022          O 2/5 - pt has taken initial steps but at times falls into passivity and has made little progress  03/19/2024 03/20/2023          O 4/5 - pt has taken steps to move forward with early retirement, but now is challenged w/ a denial & the p/ begins again        Goal 2) Learn and implement coping skills that result in a reduction of anxiety and worry, and improved daily functioning.  5 Point Likert rating baseline date:  02/23/2015 Target Date Goal Was reviewed Status Code Progress towards goal/Likert rating  03/20/2023 03/07/2022          O 3/5 - pt has learned coping skills and uses them appropriately but inconsistently  03/19/2024 03/20/2023          O 3.5 /5 - pt is better at identifying when he is anxious & more consistently uses his skills        Goal 3) Increase activities that reinforce a positive self-identity.   5 Point Likert rating baseline date: 02/23/2015 Target Date Goal Was reviewed Status Code Progress towards goal/Likert rating  03/20/2023 03/07/2022  O 2/5 - pt has taken initial steps to introduce new activities that have improved his feelings of positive self esteem  03/19/2024 03/20/2023          O 2.5/5 - pt has not taken any new steps but has recognized the need to do more & is making plans        Goal 4) Learn and implement coping skills that result in a prevention of of relapse of depression and improve  daily functioning.   5  Point Likert rating baseline date: 02/23/2015 Target Date Goal Was reviewed Status Code Progress towards goal/Likert rating   03/20/2023  03/07/2022            O  4/5 - pt has learned coping skills and uses them appropriately but inconsistently   03/19/2024  03/20/2023            O  3/5 - pt has regressed & has slipped back into old patterns which has effected his mood & fnxg         This plan has been reviewed and created by the following participants:  This plan will be reviewed at least every 12 months. Date Behavioral Health Clinician Date Guardian/Patient   03/07/2022  Elder Greening, Ph.D.   03/07/2022 Gara July  03/20/2023  Elder Greening, Ph.D.  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  We d/p this and other challenges he's faced in  the office these past two weeks.  We d/e/p what's worked and what hasn't worked in how he's approached tasks.  We also d/e/p having little motivation to work on job applications, figuring out where his life is going at this stage in his life, and fears about the future.  I wanted to have Shaquel end on a positive note and we were able to d/ what's is going right in his life right now and that he needs to mindfully attend to these positive experiences as well.   Home Practice:    Elder Greening, PhD

## 2023-11-27 ENCOUNTER — Ambulatory Visit (INDEPENDENT_AMBULATORY_CARE_PROVIDER_SITE_OTHER): Payer: Federal, State, Local not specified - PPO | Admitting: Psychology

## 2023-11-27 DIAGNOSIS — F411 Generalized anxiety disorder: Secondary | ICD-10-CM | POA: Diagnosis not present

## 2023-11-27 DIAGNOSIS — Z600 Problems of adjustment to life-cycle transitions: Secondary | ICD-10-CM | POA: Diagnosis not present

## 2023-11-27 NOTE — Progress Notes (Signed)
 PROGRESS NOTE:  Name: Timothy [name] Date: 11/27/2023 MRN: [id number] DOB: 05-03-1976 PCP: Annamarie Kid, MD  Time spent: 8:00 AM - 8:58 AM  Annual Review: 03/19/2024  Today I met with  Timothy [name] in remote video (Caregility) face-to-face individual psychotherapy.  Distance Site: Client's Home Orginating Site: Dr Durand Gift Remote Office Consent: Obtained verbal consent to transmit session remotely.   Patient is aware of the limitations related to conducting virtual therapy.    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  Since the start of treatment, depression has resolved with periodic mild reoccurrences.  Anxiety however, has proven to be chronic and effects all areas of his life.  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  Unfortunately, he application for early retirement was denied and has created a new series of questions about his future work path.  Therefore, we have continued the process of discerning next steps in his career and professional development.  Timothy [name]'s mother is aging and he is beginning to see the signs of the need for additional support and negotiating way to provide the needed support and guidance.   Mental Status Exam: Appearance:   Casual and Fairly Groomed     Behavior:  Appropriate  Motor:  Normal  Speech/Language:   NA  Affect:  Appropriate  Mood:  anxious and irritable  Thought process:  normal  Thought content:    WNL  Sensory/Perceptual disturbances:    WNL  Orientation:  oriented to person, place, time/date, and situation  Attention:  Good  Concentration:  Good  Memory:  WNL  Fund of knowledge:   Good  Insight:    Good  Judgment:   Good  Impulse Control:  Good   Risk Assessment: Danger to Self:  No Self-injurious Behavior: No Danger to Others:  No Duty to Warn:no Physical Aggression / Violence:No  Access to Firearms a concern: No   Substance Abuse History: Current substance abuse: No     Past Psychiatric History:   Previous psychological history is significant for anxiety and depression Outpatient Providers: current therapist History of Psych Hospitalization: No  Psychological Testing:  n/a    Abuse History:  Victim of: No.,  n/a    Report needed: No. Victim of Neglect:No. 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  If a parent, number of children / ages: Timothy [name] (68) they/them  Support Systems:  Spouse Friends Mother and Father-In-Law Daughter  Surveyor, quantity Stress:  Yes   Income/Employment/Disability: Employment  Financial planner: No   Educational History: Education: Risk manager: N/a  Any cultural differences that may affect / interfere with treatment:  not applicable   Recreation/Hobbies: D&D, movies  Stressors: Family Conflict, Aging parent  Barriers:  None   Legal History: Pending legal issue / charges: The patient has no significant history of legal issues. History of legal issue / charges:  n/a  Medical History/Surgical History: reviewed Past Medical History:  Diagnosis Date   AKI (acute kidney injury) (HCC) 12/22/2015   Anxiety    Diabetes mellitus (HCC)    Essential hypertension    started on lisinopril about 45  days prior to pancreatitis   Obesity    Pancreatitis, acute 12/22/2015   a. 11/2015 - acute severe pancreatitis with shock/sepsis, complicated by acute respiratory failure, transaminitis, anemia of critical illness, acute encephalopathy, AKI (Cr 1.36), protein-calorie malnutrition with severe hypoalbuminemia, & hypokalemia.   Splenic vein thrombosis     Past Surgical History:  Procedure Laterality Date   BACK SURGERY     ERCP N/A 01/31/2016   Procedure: ENDOSCOPIC RETROGRADE CHOLANGIOPANCREATOGRAPHY (ERCP);  Surgeon: Alvis Jourdain, MD;  Location: [facility] ENDOSCOPY;  Service: Endoscopy;  Laterality: N/A;    Medications: Current Outpatient Medications  Medication Sig Dispense Refill   acetaminophen  (TYLENOL ) 325 MG tablet Take 650 mg by mouth every 6 (six) hours as needed for fever.     albuterol  (PROVENTIL  HFA;VENTOLIN  HFA) 108 (90 Base) MCG/ACT inhaler Inhale 1-2 puffs into the lungs every 6 (six) hours as needed for wheezing or shortness of breath. 1 Inhaler 0   blood glucose meter kit and supplies Dispense based on patient and insurance preference. Use up to four times daily as directed. (FOR ICD-9 250.00, 250.01). 1 each 0   carvedilol  (COREG ) 25 MG tablet Take 1 tablet (25 mg total) by mouth 2 (two) times daily. 180 tablet 3   cetirizine (ZYRTEC) 10 MG tablet Take 10 mg by mouth at bedtime.     fluticasone (FLONASE) 50 MCG/ACT nasal spray Place 1 spray into both nostrils at bedtime.     guaiFENesin -codeine  100-10 MG/5ML syrup Take 5 mLs by mouth at bedtime as needed for cough. 120 mL 0   HYDROmorphone  (DILAUDID ) 1 MG/ML injection Inject 1 mL (1 mg total) into the vein every 4 (four) hours as needed for severe pain. 1 mL 0   imipenem -cilastatin  500 mg in sodium chloride  0.9 % 100 mL Inject 500 mg into the vein every 6 (six) hours.     insulin  aspart (NOVOLOG ) 100 UNIT/ML FlexPen 8 units with meals (as long as eating 50%) PLUS the following scale: CBG 70 - 120: 0 units CBG 121 - 150: 2 units CBG  151 - 200: 3 units CBG 201 - 250: 5 units CBG 251 - 300: 8 units CBG 301 - 350: 11 units CBG 351 - 400: 15 units (Patient taking differently: Inject 8 Units into the skin 3 (three) times daily with meals. 8 units with meals (as long as eating 50%) PLUS the following scale: CBG 70 - 120: 0 units CBG 121 - 150: 2 units CBG 151 - 200: 3 units CBG 201 - 250: 5 units CBG 251 - 300: 8 units CBG 301 - 350: 11 units CBG 351 - 400: 15 units) 60 mL 11   Insulin  Glargine (LANTUS  SOLOSTAR) 100 UNIT/ML Solostar Pen Inject 25 Units into the skin daily at 10 pm. 15 mL 11   Insulin  Pen Needle 31G X 5 MM MISC For insulin  injections 100 each 1   pantoprazole  (PROTONIX ) 40 MG tablet Take 1 tablet (40 mg total) by mouth at bedtime. 30 tablet 0   No current facility-administered medications for this visit.    No Known Allergies  Individualized Treatment Plan                Strengths: verbal, bright, friendly  Supports: wife, daughter, parents   Goal/Needs for Treatment:  In order of importance to patient 1)  Resolve conflicted feelings, takes steps to change work situation and adapt to the new life circumstances.   2) Learn and implement coping skills that result in a reduction of anxiety and worry, and improved daily functioning.  3) Increase activities that reinforce a positive self-identity.  4) Learn and implement coping skills that result in a prevention of of relapse of depression and improve daily functioning.    Client Statement of Needs: work on pervasive issues with anxiety, assistance in making a big life change as he contemplates retiring from current position and figures out new direction   Treatment Level: Individual Biweekly Outpatient Psychotherapy  Symptoms: Autonomic hyperactivity (e.g., palpitations, shortness of breath, dry mouth, trouble swallowing, nausea, diarrhea).  Periodic periods of depressed or irritable mood.  Excessive and/or unrealistic worry that is difficult  to control occurring more days than not for at least 6 months about a number of events or activities.  Feelings of hopelessness, worthlessness, or inappropriate guilt.  Hypervigilance (e.g., feeling constantly on edge, experiencing concentration difficulties, having trouble falling or staying asleep, exhibiting a general state of irritability).  Low self-esteem.  Behavioral passivity, difficulties initiating action Motor tension (e.g., restlessness, tiredness, shakiness, muscle tension).  Restlessness and feelings of lost identity and meaning due to retirement.   Client Treatment Preferences: continue with current therapist   Healthcare consumer's goal for treatment:  Psychologist, Timothy [name], Ph.D. will support the patient's ability to achieve the goals identified. Cognitive Behavioral Therapy, Dialectical Behavioral Therapy, Motivational Interviewing, Behavior Activation, parent skills, and other evidenced-based practices will be used to promote progress towards healthy functioning.   Healthcare consumer Timothy [name] will: Actively participate in therapy, working towards healthy functioning.    *Justification for Continuation/Discontinuation of Goal: R=Revised, O=Ongoing, A=Achieved, D=Discontinued  Goal 1) Resolve conflicted feelings, takes steps to change work situation and adapt to the new life circumstances.   5 Point Likert rating baseline date: 02/22/2021 Target Date Goal Was reviewed Status Code Progress towards goal/Likert rating  03/20/2023 03/07/2022          O 2/5 - pt has taken initial steps but at times falls into passivity and has made little progress  03/19/2024 03/20/2023          O 4/5 - pt has taken steps to move forward with early retirement, but now is challenged w/ a denial & the p/ begins again        Goal 2) Learn and implement coping skills that result in a reduction of anxiety and worry, and improved daily functioning.  5 Point Likert rating baseline date:  02/23/2015 Target Date Goal Was reviewed Status Code Progress towards goal/Likert rating  03/20/2023 03/07/2022          O 3/5 - pt has learned coping skills and uses them appropriately but inconsistently  03/19/2024 03/20/2023          O 3.5 /5 - pt is better at identifying when he is anxious & more consistently uses his skills        Goal 3) Increase activities that reinforce a positive self-identity.   5 Point Likert rating baseline date: 02/23/2015 Target Date Goal Was reviewed Status Code Progress towards goal/Likert rating  03/20/2023 03/07/2022  O 2/5 - pt has taken initial steps to introduce new activities that have improved his feelings of positive self esteem  03/19/2024 03/20/2023          O 2.5/5 - pt has not taken any new steps but has recognized the need to do more & is making plans        Goal 4) Learn and implement coping skills that result in a prevention of of relapse of depression and improve  daily functioning.   5  Point Likert rating baseline date: 02/23/2015 Target Date Goal Was reviewed Status Code Progress towards goal/Likert rating   03/20/2023  03/07/2022            O  4/5 - pt has learned coping skills and uses them appropriately but inconsistently   03/19/2024  03/20/2023            O  3/5 - pt has regressed & has slipped back into old patterns which has effected his mood & fnxg         This plan has been reviewed and created by the following participants:  This plan will be reviewed at least every 12 months. Date Behavioral Health Clinician Date Guardian/Patient   03/07/2022  Timothy [name], Ph.D.   03/07/2022 Timothy [name]  03/20/2023  Timothy [name], Ph.D.  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  We d/e/p what occurred, having a better sense of where things are being held up,  and letting go resentment.  Throughout the session, Timothy [name] talked a lot about what he planned to do later down the line.  I challenged his putting things off, I reality tested the idea that he had a lot of time (33 days), and encouraged him to take some steps toward his next employment.  He was able to see that he was falling into his pattern of not dealing with the unpleasantness of the process.  We identified a few things he could accomplish in the next two weeks.   Home Practice:  f/t with 3 tasks  Timothy Greening, PhD

## 2023-12-11 ENCOUNTER — Ambulatory Visit: Payer: Federal, State, Local not specified - PPO | Admitting: Psychology

## 2024-01-08 ENCOUNTER — Ambulatory Visit (INDEPENDENT_AMBULATORY_CARE_PROVIDER_SITE_OTHER): Admitting: Psychology

## 2024-01-08 DIAGNOSIS — F411 Generalized anxiety disorder: Secondary | ICD-10-CM | POA: Diagnosis not present

## 2024-01-08 DIAGNOSIS — Z600 Problems of adjustment to life-cycle transitions: Secondary | ICD-10-CM | POA: Diagnosis not present

## 2024-01-08 NOTE — Progress Notes (Signed)
 PROGRESS NOTE:  Name: Timothy [name] Date: 01/08/2024 MRN: [id number] DOB: [birth year] PCP: Annamarie Kid, MD  Time spent: 8:00 AM - 8:58 AM  Annual Review: 03/19/2024  Today I met with  Timothy [name] in remote video (Caregility) face-to-face individual psychotherapy.  Distance Site: Client's Home Orginating Site: Dr Durand Gift Remote Office Consent: Obtained verbal consent to transmit session remotely.   Patient is aware of the limitations related to conducting virtual therapy.    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  Since the start of treatment, depression has resolved with periodic mild reoccurrences.  Anxiety however, has proven to be chronic and effects all areas of his life.  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  Unfortunately, he application for early retirement was denied and has created a new series of questions about his future work path.  Therefore, we have continued the process of discerning next steps in his career and professional development.  Timothy [name]'s mother is aging and he is beginning to see the signs of the need for additional support and negotiating way to provide the needed support and guidance.   Mental Status Exam: Appearance:   Casual and Fairly Groomed     Behavior:  Appropriate  Motor:  Normal  Speech/Language:   NA  Affect:  Appropriate  Mood:  anxious and irritable  Thought process:  normal  Thought content:    WNL  Sensory/Perceptual disturbances:    WNL  Orientation:  oriented to person, place, time/date, and situation  Attention:  Good  Concentration:  Good  Memory:  WNL  Fund of knowledge:   Good  Insight:    Good  Judgment:   Good  Impulse Control:  Good   Risk Assessment: Danger to Self:  No Self-injurious Behavior: No Danger to Others:  No Duty to Warn:no Physical Aggression / Violence:No  Access to Firearms a concern: No   Substance Abuse History: Current substance abuse: No     Past Psychiatric History:   Previous psychological history is significant for anxiety and depression Outpatient Providers: current therapist History of Psych Hospitalization: No  Psychological Testing: n/a   Abuse History:  Victim of: No., n/a   Report needed: No. Victim of Neglect:No. 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  If a parent, number of children / ages: [name] (36) they/them  Support Systems:  Spouse Friends Mother and Father-In-Law Daughter  Surveyor, quantity Stress:  Yes   Income/Employment/Disability: Employment  Financial planner: No   Educational History: Education: Risk manager: N/a  Any cultural differences that may affect / interfere with treatment:  not applicable   Recreation/Hobbies: D&D, movies  Stressors: Family Conflict, Aging parent  Barriers:  None  Legal History: Pending legal issue / charges: The patient has no significant history of legal issues. History of legal issue / charges: n/a  Medical History/Surgical History: reviewed Past Medical History:  Diagnosis Date   AKI (acute kidney injury) (HCC) 12/22/2015   Anxiety    Diabetes mellitus (HCC)    Essential hypertension    started on lisinopril about 45 days  prior to pancreatitis   Obesity    Pancreatitis, acute 12/22/2015   a. 11/2015 - acute severe pancreatitis with shock/sepsis, complicated by acute respiratory failure, transaminitis, anemia of critical illness, acute encephalopathy, AKI (Cr 1.36), protein-calorie malnutrition with severe hypoalbuminemia, & hypokalemia.   Splenic vein thrombosis     Past Surgical History:  Procedure Laterality Date   BACK SURGERY     ERCP N/A 01/31/2016   Procedure: ENDOSCOPIC RETROGRADE CHOLANGIOPANCREATOGRAPHY (ERCP);  Surgeon: Alvis Jourdain, MD;  Location: [facility] ENDOSCOPY;  Service: Endoscopy;  Laterality: N/A;    Medications: Current Outpatient Medications  Medication Sig Dispense Refill   acetaminophen  (TYLENOL ) 325 MG tablet Take 650 mg by mouth every 6 (six) hours as needed for fever.     albuterol  (PROVENTIL  HFA;VENTOLIN  HFA) 108 (90 Base) MCG/ACT inhaler Inhale 1-2 puffs into the lungs every 6 (six) hours as needed for wheezing or shortness of breath. 1 Inhaler 0   blood glucose meter kit and supplies Dispense based on patient and insurance preference. Use up to four times daily as directed. (FOR ICD-9 250.00, 250.01). 1 each 0   carvedilol  (COREG ) 25 MG tablet Take 1 tablet (25 mg total) by mouth 2 (two) times daily. 180 tablet 3   cetirizine (ZYRTEC) 10 MG tablet Take 10 mg by mouth at bedtime.     fluticasone (FLONASE) 50 MCG/ACT nasal spray Place 1 spray into both nostrils at bedtime.     guaiFENesin -codeine  100-10 MG/5ML syrup Take 5 mLs by mouth at bedtime as needed for cough. 120 mL 0   HYDROmorphone  (DILAUDID ) 1 MG/ML injection Inject 1 mL (1 mg total) into the vein every 4 (four) hours as needed for severe pain. 1 mL 0   imipenem -cilastatin  500 mg in sodium chloride  0.9 % 100 mL Inject 500 mg into the vein every 6 (six) hours.     insulin  aspart (NOVOLOG ) 100 UNIT/ML FlexPen 8 units with meals (as long as eating 50%) PLUS the following scale: CBG 70 - 120: 0 units CBG 121 - 150: 2 units CBG 151  - 200: 3 units CBG 201 - 250: 5 units CBG 251 - 300: 8 units CBG 301 - 350: 11 units CBG 351 - 400: 15 units (Patient taking differently: Inject 8 Units into the skin 3 (three) times daily with meals. 8 units with meals (as long as eating 50%) PLUS the following scale: CBG 70 - 120: 0 units CBG 121 - 150: 2 units CBG 151 - 200: 3 units CBG 201 - 250: 5 units CBG 251 - 300: 8 units CBG 301 - 350: 11 units CBG 351 - 400: 15 units) 60 mL 11   Insulin  Glargine (LANTUS  SOLOSTAR) 100 UNIT/ML Solostar Pen Inject 25 Units into the skin daily at 10 pm. 15 mL 11   Insulin  Pen Needle 31G X 5 MM MISC For insulin  injections 100 each 1   pantoprazole  (PROTONIX ) 40 MG tablet Take 1 tablet (40 mg total) by mouth at bedtime. 30 tablet 0   No current facility-administered medications for this visit.    No Known Allergies    Individualized Treatment Plan  Strengths: verbal, bright, friendly  Supports: wife, daughter, parents   Goal/Needs for Treatment:  In order of importance to patient 1)  Resolve conflicted feelings, takes steps to change work situation and adapt to the new life circumstances.   2) Learn and implement coping skills that result in a reduction of anxiety and worry, and improved daily functioning.  3) Increase activities that reinforce a positive self-identity.  4) Learn and implement coping skills that result in a prevention of of relapse of depression and improve daily functioning.    Client Statement of Needs: work on pervasive issues with anxiety, assistance in making a big life change as he contemplates retiring from current position and figures out new direction   Treatment Level: Individual Biweekly Outpatient Psychotherapy  Symptoms: Autonomic hyperactivity (e.g., palpitations, shortness of breath, dry mouth, trouble swallowing, nausea, diarrhea).  Periodic periods of depressed or irritable mood.  Excessive and/or unrealistic worry that is difficult to  control occurring more days than not for at least 6 months about a number of events or activities.  Feelings of hopelessness, worthlessness, or inappropriate guilt.  Hypervigilance (e.g., feeling constantly on edge, experiencing concentration difficulties, having trouble falling or staying asleep, exhibiting a general state of irritability).  Low self-esteem.  Behavioral passivity, difficulties initiating action Motor tension (e.g., restlessness, tiredness, shakiness, muscle tension).  Restlessness and feelings of lost identity and meaning due to retirement.   Client Treatment Preferences: continue with current therapist   Healthcare consumer's goal for treatment:  Psychologist, Elder Greening, Ph.D. will support the patient's ability to achieve the goals identified. Cognitive Behavioral Therapy, Dialectical Behavioral Therapy, Motivational Interviewing, Behavior Activation, parent skills, and other evidenced-based practices will be used to promote progress towards healthy functioning.   Healthcare consumer Jatavian Calica will: Actively participate in therapy, working towards healthy functioning.    *Justification for Continuation/Discontinuation of Goal: R=Revised, O=Ongoing, A=Achieved, D=Discontinued  Goal 1) Resolve conflicted feelings, takes steps to change work situation and adapt to the new life circumstances.   5 Point Likert rating baseline date: 02/22/2021 Target Date Goal Was reviewed Status Code Progress towards goal/Likert rating  03/20/2023 03/07/2022          O 2/5 - pt has taken initial steps but at times falls into passivity and has made little progress  03/19/2024 03/20/2023          O 4/5 - pt has taken steps to move forward with early retirement, but now is challenged w/ a denial & the p/ begins again        Goal 2) Learn and implement coping skills that result in a reduction of anxiety and worry, and improved daily functioning.  5 Point Likert rating baseline date:  02/23/2015 Target Date Goal Was reviewed Status Code Progress towards goal/Likert rating  03/20/2023 03/07/2022          O 3/5 - pt has learned coping skills and uses them appropriately but inconsistently  03/19/2024 03/20/2023          O 3.5 /5 - pt is better at identifying when he is anxious & more consistently uses his skills        Goal 3) Increase activities that reinforce a positive self-identity.   5 Point Likert rating baseline date: 02/23/2015 Target Date Goal Was reviewed Status Code Progress towards goal/Likert rating  03/20/2023 03/07/2022          O 2/5 - pt has taken initial steps to introduce new activities that have improved his feelings  of positive self esteem  03/19/2024 03/20/2023          O 2.5/5 - pt has not taken any new steps but has recognized the need to do more & is making plans        Goal 4) Learn and implement coping skills that result in a prevention of of relapse of depression and improve  daily functioning.   5  Point Likert rating baseline date: 02/23/2015 Target Date Goal Was reviewed Status Code Progress towards goal/Likert rating   03/20/2023  03/07/2022            O  4/5 - pt has learned coping skills and uses them appropriately but inconsistently   03/19/2024  03/20/2023            O  3/5 - pt has regressed & has slipped back into old patterns which has effected his mood & fnxg         This plan has been reviewed and created by the following participants:  This plan will be reviewed at least every 12 months. Date Behavioral Health Clinician Date Guardian/Patient   03/07/2022  Elder Greening, Ph.D.   03/07/2022 Timothy [name]  03/20/2023  Elder Greening, Ph.D.  03/20/2023 Timothy [name]               Diagnoses:  Generalized Anxiety Disorder Phase of Life Change - Adult   [name] reports that he is officially retired from [employer].  We d/e/p what occurred, his experience of saying good bye, and how he was feeling about his last day.   We d/p  starting a new position in [name], and plans for how he will spend this work free month.  I noted that this was a good time for him to consider what he requires (work space) to work from home, the opportunity to schedule regular time to exercise, and strategies for executing his plans.  Home Practice:  create a new work routine  Elder Greening, PhD

## 2024-01-22 ENCOUNTER — Ambulatory Visit (INDEPENDENT_AMBULATORY_CARE_PROVIDER_SITE_OTHER): Admitting: Psychology

## 2024-01-22 DIAGNOSIS — F411 Generalized anxiety disorder: Secondary | ICD-10-CM

## 2024-01-22 DIAGNOSIS — Z600 Problems of adjustment to life-cycle transitions: Secondary | ICD-10-CM

## 2024-01-22 NOTE — Progress Notes (Signed)
 PROGRESS NOTE:  Name: Timothy [name] Date: 01/22/2024 MRN: [id number] DOB: [birth year] PCP: Loretha Richerd SAUNDERS, MD  Time spent: 8:00 AM - 8:58 AM  Annual Review: 03/19/2024  Today I met with  Timothy [name] in remote video (Caregility) face-to-face individual psychotherapy.  Distance Site: Client's Home Orginating Site: Dr Edison Remote Office Consent: Obtained verbal consent to transmit session remotely.   Patient is aware of the limitations related to conducting virtual therapy.    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  Since the start of treatment, depression has resolved with periodic mild reoccurrences.  Anxiety however, has proven to be chronic and effects all areas of his life.  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  Unfortunately, he application for early retirement was denied and has created a new series of questions about his future work path.  Therefore, we have continued the process of discerning next steps in his career and professional development.  Timothy [name]'s mother is aging and he is beginning to see the signs of the need for additional support and negotiating way to provide the needed support and guidance.   Mental Status Exam: Appearance:   Casual and Fairly Groomed     Behavior:  Appropriate  Motor:  Normal  Speech/Language:   NA  Affect:  Appropriate  Mood:  anxious and irritable  Thought process:  normal  Thought content:    WNL  Sensory/Perceptual disturbances:    WNL  Orientation:  oriented to person, place, time/date, and situation  Attention:  Good  Concentration:  Good  Memory:  WNL  Fund of knowledge:   Good  Insight:    Good  Judgment:   Good  Impulse Control:  Good   Risk Assessment: Danger to Self:  No Self-injurious Behavior: No Danger to Others:  No Duty to Warn:no Physical Aggression / Violence:No  Access to Firearms a concern: No   Substance Abuse History: Current substance abuse: No     Past Psychiatric History:   Previous psychological history is significant for anxiety and depression Outpatient Providers: current therapist History of Psych Hospitalization: No  Psychological Testing: n/a   Abuse History:  Victim of: No., n/a   Report needed: No. Victim of Neglect:No. 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  If a parent, number of children / ages: Timothy [name] (49) they/them  Support Systems:  Spouse Friends Mother and Father-In-Law Daughter  Surveyor, quantity Stress:  Yes   Income/Employment/Disability: Employment  Financial planner: No   Educational History: Education: Risk manager: N/a  Any cultural differences that may affect / interfere with treatment:  not applicable   Recreation/Hobbies: D&D, movies  Stressors: Family Conflict, Aging parent  Barriers:  None  Legal History: Pending legal issue / charges: The patient has no significant history of legal issues. History of legal issue / charges: n/a  Medical History/Surgical History: reviewed Past Medical History:  Diagnosis Date   AKI (acute kidney injury) (HCC) 12/22/2015   Anxiety    Diabetes mellitus (HCC)    Essential hypertension    started on lisinopril about 45 days  prior to pancreatitis   Obesity    Pancreatitis, acute 12/22/2015   a. 11/2015 - acute severe pancreatitis with shock/sepsis, complicated by acute respiratory failure, transaminitis, anemia of critical illness, acute encephalopathy, AKI (Cr 1.36), protein-calorie malnutrition with severe hypoalbuminemia, & hypokalemia.   Splenic vein thrombosis     Past Surgical History:  Procedure Laterality Date   BACK SURGERY     ERCP N/A 01/31/2016   Procedure: ENDOSCOPIC RETROGRADE CHOLANGIOPANCREATOGRAPHY (ERCP);  Surgeon: Belvie Just, MD;  Location: [facility] ENDOSCOPY;  Service: Endoscopy;  Laterality: N/A;    Medications: Current Outpatient Medications  Medication Sig Dispense Refill   acetaminophen  (TYLENOL ) 325 MG tablet Take 650 mg by mouth every 6 (six) hours as needed for fever.     albuterol  (PROVENTIL  HFA;VENTOLIN  HFA) 108 (90 Base) MCG/ACT inhaler Inhale 1-2 puffs into the lungs every 6 (six) hours as needed for wheezing or shortness of breath. 1 Inhaler 0   blood glucose meter kit and supplies Dispense based on patient and insurance preference. Use up to four times daily as directed. (FOR ICD-9 250.00, 250.01). 1 each 0   carvedilol  (COREG ) 25 MG tablet Take 1 tablet (25 mg total) by mouth 2 (two) times daily. 180 tablet 3   cetirizine (ZYRTEC) 10 MG tablet Take 10 mg by mouth at bedtime.     fluticasone (FLONASE) 50 MCG/ACT nasal spray Place 1 spray into both nostrils at bedtime.     guaiFENesin -codeine  100-10 MG/5ML syrup Take 5 mLs by mouth at bedtime as needed for cough. 120 mL 0   HYDROmorphone  (DILAUDID ) 1 MG/ML injection Inject 1 mL (1 mg total) into the vein every 4 (four) hours as needed for severe pain. 1 mL 0   imipenem -cilastatin  500 mg in sodium chloride  0.9 % 100 mL Inject 500 mg into the vein every 6 (six) hours.     insulin  aspart (NOVOLOG ) 100 UNIT/ML FlexPen 8 units with meals (as long as eating 50%) PLUS the following scale: CBG 70 - 120: 0 units CBG 121 - 150: 2 units CBG 151  - 200: 3 units CBG 201 - 250: 5 units CBG 251 - 300: 8 units CBG 301 - 350: 11 units CBG 351 - 400: 15 units (Patient taking differently: Inject 8 Units into the skin 3 (three) times daily with meals. 8 units with meals (as long as eating 50%) PLUS the following scale: CBG 70 - 120: 0 units CBG 121 - 150: 2 units CBG 151 - 200: 3 units CBG 201 - 250: 5 units CBG 251 - 300: 8 units CBG 301 - 350: 11 units CBG 351 - 400: 15 units) 60 mL 11   Insulin  Glargine (LANTUS  SOLOSTAR) 100 UNIT/ML Solostar Pen Inject 25 Units into the skin daily at 10 pm. 15 mL 11   Insulin  Pen Needle 31G X 5 MM MISC For insulin  injections 100 each 1   pantoprazole  (PROTONIX ) 40 MG tablet Take 1 tablet (40 mg total) by mouth at bedtime. 30 tablet 0   No current facility-administered medications for this visit.    No Known Allergies    Individualized Treatment Plan  Strengths: verbal, bright, friendly  Supports: wife, daughter, parents   Goal/Needs for Treatment:  In order of importance to patient 1)  Resolve conflicted feelings, takes steps to change work situation and adapt to the new life circumstances.   2) Learn and implement coping skills that result in a reduction of anxiety and worry, and improved daily functioning.  3) Increase activities that reinforce a positive self-identity.  4) Learn and implement coping skills that result in a prevention of of relapse of depression and improve daily functioning.    Client Statement of Needs: work on pervasive issues with anxiety, assistance in making a big life change as he contemplates retiring from current position and figures out new direction   Treatment Level: Individual Biweekly Outpatient Psychotherapy  Symptoms: Autonomic hyperactivity (e.g., palpitations, shortness of breath, dry mouth, trouble swallowing, nausea, diarrhea).  Periodic periods of depressed or irritable mood.  Excessive and/or unrealistic worry that is difficult to  control occurring more days than not for at least 6 months about a number of events or activities.  Feelings of hopelessness, worthlessness, or inappropriate guilt.  Hypervigilance (e.g., feeling constantly on edge, experiencing concentration difficulties, having trouble falling or staying asleep, exhibiting a general state of irritability).  Low self-esteem.  Behavioral passivity, difficulties initiating action Motor tension (e.g., restlessness, tiredness, shakiness, muscle tension).  Restlessness and feelings of lost identity and meaning due to retirement.   Client Treatment Preferences: continue with current therapist   Healthcare consumer's goal for treatment:  Psychologist, Ronal Jenkins Sprung, Ph.D. will support the patient's ability to achieve the goals identified. Cognitive Behavioral Therapy, Dialectical Behavioral Therapy, Motivational Interviewing, Behavior Activation, parent skills, and other evidenced-based practices will be used to promote progress towards healthy functioning.   Healthcare consumer Jguadalupe Opiela will: Actively participate in therapy, working towards healthy functioning.    *Justification for Continuation/Discontinuation of Goal: R=Revised, O=Ongoing, A=Achieved, D=Discontinued  Goal 1) Resolve conflicted feelings, takes steps to change work situation and adapt to the new life circumstances.   5 Point Likert rating baseline date: 02/22/2021 Target Date Goal Was reviewed Status Code Progress towards goal/Likert rating  03/20/2023 03/07/2022          O 2/5 - pt has taken initial steps but at times falls into passivity and has made little progress  03/19/2024 03/20/2023          O 4/5 - pt has taken steps to move forward with early retirement, but now is challenged w/ a denial & the p/ begins again        Goal 2) Learn and implement coping skills that result in a reduction of anxiety and worry, and improved daily functioning.  5 Point Likert rating baseline date:  02/23/2015 Target Date Goal Was reviewed Status Code Progress towards goal/Likert rating  03/20/2023 03/07/2022          O 3/5 - pt has learned coping skills and uses them appropriately but inconsistently  03/19/2024 03/20/2023          O 3.5 /5 - pt is better at identifying when he is anxious & more consistently uses his skills        Goal 3) Increase activities that reinforce a positive self-identity.   5 Point Likert rating baseline date: 02/23/2015 Target Date Goal Was reviewed Status Code Progress towards goal/Likert rating  03/20/2023 03/07/2022          O 2/5 - pt has taken initial steps to introduce new activities that have improved his feelings  of positive self esteem  03/19/2024 03/20/2023          O 2.5/5 - pt has not taken any new steps but has recognized the need to do more & is making plans        Goal 4) Learn and implement coping skills that result in a prevention of of relapse of depression and improve  daily functioning.   5  Point Likert rating baseline date: 02/23/2015 Target Date Goal Was reviewed Status Code Progress towards goal/Likert rating   03/20/2023  03/07/2022            O  4/5 - pt has learned coping skills and uses them appropriately but inconsistently   03/19/2024  03/20/2023            O  3/5 - pt has regressed & has slipped back into old patterns which has effected his mood & fnxg         This plan has been reviewed and created by the following participants:  This plan will be reviewed at least every 12 months. Date Behavioral Health Clinician Date Guardian/Patient   03/07/2022  Ronal Jenkins Sprung, Ph.D.   03/07/2022 Timothy [name]  03/20/2023  Ronal Jenkins Sprung, Ph.D.  03/20/2023 Timothy [name]               Diagnoses:  Generalized Anxiety Disorder Phase of Life Change - Adult   Shafter reports that he and his wife had a nice weekend away despite the difficulties their daughter had at camp.  We d/e/p his anxieties about geo-political events, and how to manage  these anxieties.  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  Home Practice:  create a new work routine  Ronal Jenkins Sprung, PhD

## 2024-02-05 ENCOUNTER — Ambulatory Visit (INDEPENDENT_AMBULATORY_CARE_PROVIDER_SITE_OTHER): Admitting: Psychology

## 2024-02-05 DIAGNOSIS — F411 Generalized anxiety disorder: Secondary | ICD-10-CM

## 2024-02-05 DIAGNOSIS — Z600 Problems of adjustment to life-cycle transitions: Secondary | ICD-10-CM | POA: Diagnosis not present

## 2024-02-05 NOTE — Progress Notes (Addendum)
 PROGRESS NOTE:  Name: Timothy [name] Date: 02/05/2024 MRN: [id number] DOB: [birth year] PCP: Loretha Richerd SAUNDERS, MD  Time spent: 8:01 AM - 8:59 AM  Annual Review: 03/19/2024  Today I met with  Timothy [name] in remote video (Caregility) face-to-face individual psychotherapy.  Distance Site: Client's Home Orginating Site: Dr Edison Remote Office Consent: Obtained verbal consent to transmit session remotely.   Patient is aware of the limitations related to conducting virtual therapy.    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  Since the start of treatment, depression has resolved with periodic mild reoccurrences.  Anxiety however, has proven to be chronic and effects all areas of his life.  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  Unfortunately, he application for early retirement was denied and has created a new series of questions about his future work path.  Therefore, we have continued the process of discerning next steps in his career and professional development.  Timothy [name]'s mother is aging and he is beginning to see the signs of the need for additional support and negotiating way to provide the needed support and guidance.   Mental Status Exam: Appearance:   Casual and Fairly Groomed     Behavior:  Appropriate  Motor:  Normal  Speech/Language:   NA  Affect:  Appropriate  Mood:  anxious and irritable  Thought process:  normal  Thought content:    WNL  Sensory/Perceptual disturbances:    WNL  Orientation:  oriented to person, place, time/date, and situation  Attention:  Good  Concentration:  Good  Memory:  WNL  Fund of knowledge:   Good  Insight:    Good  Judgment:   Good  Impulse Control:  Good   Risk Assessment: Danger to Self:  No Self-injurious Behavior: No Danger to Others:  No Duty to Warn:no Physical Aggression / Violence:No  Access to Firearms a concern: No   Substance Abuse History: Current substance abuse: No     Past Psychiatric History:   Previous psychological history is significant for anxiety and depression Outpatient Providers: current therapist History of Psych Hospitalization: No  Psychological Testing: n/a   Abuse History:  Victim of: No., n/a   Report needed: No. Victim of Neglect:No. 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  If a parent, number of children / ages: Timothy [name] (38) they/them  Support Systems:  Spouse Friends Mother and Father-In-Law Daughter  Surveyor, quantity Stress:  Yes   Income/Employment/Disability: Employment  Financial planner: No   Educational History: Education: Risk manager: N/a  Any cultural differences that may affect / interfere with treatment:  not applicable   Recreation/Hobbies: D&D, movies  Stressors: Family Conflict, Aging parent  Barriers:  None  Legal History: Pending legal issue / charges: The patient has no significant history of legal issues. History of legal issue / charges: n/a  Medical History/Surgical History: reviewed Past Medical History:  Diagnosis Date   AKI (acute kidney injury) (HCC) 12/22/2015   Anxiety    Diabetes mellitus (HCC)    Essential hypertension    started on lisinopril about 45 days  prior to pancreatitis   Obesity    Pancreatitis, acute 12/22/2015   a. 11/2015 - acute severe pancreatitis with shock/sepsis, complicated by acute respiratory failure, transaminitis, anemia of critical illness, acute encephalopathy, AKI (Cr 1.36), protein-calorie malnutrition with severe hypoalbuminemia, & hypokalemia.   Splenic vein thrombosis     Past Surgical History:  Procedure Laterality Date   BACK SURGERY     ERCP N/A 01/31/2016   Procedure: ENDOSCOPIC RETROGRADE CHOLANGIOPANCREATOGRAPHY (ERCP);  Surgeon: Belvie Just, MD;  Location[facility] ENDOSCOPY;  Service: Endoscopy;  Laterality: N/A;    Medications: Current Outpatient Medications  Medication Sig Dispense Refill   acetaminophen  (TYLENOL ) 325 MG tablet Take 650 mg by mouth every 6 (six) hours as needed for fever.     albuterol  (PROVENTIL  HFA;VENTOLIN  HFA) 108 (90 Base) MCG/ACT inhaler Inhale 1-2 puffs into the lungs every 6 (six) hours as needed for wheezing or shortness of breath. 1 Inhaler 0   blood glucose meter kit and supplies Dispense based on patient and insurance preference. Use up to four times daily as directed. (FOR ICD-9 250.00, 250.01). 1 each 0   carvedilol  (COREG ) 25 MG tablet Take 1 tablet (25 mg total) by mouth 2 (two) times daily. 180 tablet 3   cetirizine (ZYRTEC) 10 MG tablet Take 10 mg by mouth at bedtime.     fluticasone (FLONASE) 50 MCG/ACT nasal spray Place 1 spray into both nostrils at bedtime.     guaiFENesin -codeine  100-10 MG/5ML syrup Take 5 mLs by mouth at bedtime as needed for cough. 120 mL 0   HYDROmorphone  (DILAUDID ) 1 MG/ML injection Inject 1 mL (1 mg total) into the vein every 4 (four) hours as needed for severe pain. 1 mL 0   imipenem -cilastatin  500 mg in sodium chloride  0.9 % 100 mL Inject 500 mg into the vein every 6 (six) hours.     insulin  aspart (NOVOLOG ) 100 UNIT/ML FlexPen 8 units with meals (as long as eating 50%) PLUS the following scale: CBG 70 - 120: 0 units CBG 121 - 150: 2 units CBG 151  - 200: 3 units CBG 201 - 250: 5 units CBG 251 - 300: 8 units CBG 301 - 350: 11 units CBG 351 - 400: 15 units (Patient taking differently: Inject 8 Units into the skin 3 (three) times daily with meals. 8 units with meals (as long as eating 50%) PLUS the following scale: CBG 70 - 120: 0 units CBG 121 - 150: 2 units CBG 151 - 200: 3 units CBG 201 - 250: 5 units CBG 251 - 300: 8 units CBG 301 - 350: 11 units CBG 351 - 400: 15 units) 60 mL 11   Insulin  Glargine (LANTUS  SOLOSTAR) 100 UNIT/ML Solostar Pen Inject 25 Units into the skin daily at 10 pm. 15 mL 11   Insulin  Pen Needle 31G X 5 MM MISC For insulin  injections 100 each 1   pantoprazole  (PROTONIX ) 40 MG tablet Take 1 tablet (40 mg total) by mouth at bedtime. 30 tablet 0   No current facility-administered medications for this visit.    No Known Allergies    Individualized Treatment Plan  Strengths: verbal, bright, friendly  Supports: wife, daughter, parents   Goal/Needs for Treatment:  In order of importance to patient 1)  Resolve conflicted feelings, takes steps to change work situation and adapt to the new life circumstances.   2) Learn and implement coping skills that result in a reduction of anxiety and worry, and improved daily functioning.  3) Increase activities that reinforce a positive self-identity.  4) Learn and implement coping skills that result in a prevention of of relapse of depression and improve daily functioning.    Client Statement of Needs: work on pervasive issues with anxiety, assistance in making a big life change as he contemplates retiring from current position and figures out new direction   Treatment Level: Individual Biweekly Outpatient Psychotherapy  Symptoms: Autonomic hyperactivity (e.g., palpitations, shortness of breath, dry mouth, trouble swallowing, nausea, diarrhea).  Periodic periods of depressed or irritable mood.  Excessive and/or unrealistic worry that is difficult to  control occurring more days than not for at least 6 months about a number of events or activities.  Feelings of hopelessness, worthlessness, or inappropriate guilt.  Hypervigilance (e.g., feeling constantly on edge, experiencing concentration difficulties, having trouble falling or staying asleep, exhibiting a general state of irritability).  Low self-esteem.  Behavioral passivity, difficulties initiating action Motor tension (e.g., restlessness, tiredness, shakiness, muscle tension).  Restlessness and feelings of lost identity and meaning due to retirement.   Client Treatment Preferences: continue with current therapist   Healthcare consumer's goal for treatment:  Psychologist, Ronal Jenkins Sprung, Ph.D. will support the patient's ability to achieve the goals identified. Cognitive Behavioral Therapy, Dialectical Behavioral Therapy, Motivational Interviewing, Behavior Activation, parent skills, and other evidenced-based practices will be used to promote progress towards healthy functioning.   Healthcare consumer Eberardo Demello will: Actively participate in therapy, working towards healthy functioning.    *Justification for Continuation/Discontinuation of Goal: R=Revised, O=Ongoing, A=Achieved, D=Discontinued  Goal 1) Resolve conflicted feelings, takes steps to change work situation and adapt to the new life circumstances.   5 Point Likert rating baseline date: 02/22/2021 Target Date Goal Was reviewed Status Code Progress towards goal/Likert rating  03/20/2023 03/07/2022          O 2/5 - pt has taken initial steps but at times falls into passivity and has made little progress  03/19/2024 03/20/2023          O 4/5 - pt has taken steps to move forward with early retirement, but now is challenged w/ a denial & the p/ begins again        Goal 2) Learn and implement coping skills that result in a reduction of anxiety and worry, and improved daily functioning.  5 Point Likert rating baseline date:  02/23/2015 Target Date Goal Was reviewed Status Code Progress towards goal/Likert rating  03/20/2023 03/07/2022          O 3/5 - pt has learned coping skills and uses them appropriately but inconsistently  03/19/2024 03/20/2023          O 3.5 /5 - pt is better at identifying when he is anxious & more consistently uses his skills        Goal 3) Increase activities that reinforce a positive self-identity.   5 Point Likert rating baseline date: 02/23/2015 Target Date Goal Was reviewed Status Code Progress towards goal/Likert rating  03/20/2023 03/07/2022          O 2/5 - pt has taken initial steps to introduce new activities that have improved his feelings  of positive self esteem  03/19/2024 03/20/2023          O 2.5/5 - pt has not taken any new steps but has recognized the need to do more & is making plans        Goal 4) Learn and implement coping skills that result in a prevention of of relapse of depression and improve  daily functioning.   5  Point Likert rating baseline date: 02/23/2015 Target Date Goal Was reviewed Status Code Progress towards goal/Likert rating   03/20/2023  03/07/2022            O  4/5 - pt has learned coping skills and uses them appropriately but inconsistently   03/19/2024  03/20/2023            O  3/5 - pt has regressed & has slipped back into old patterns which has effected his mood & fnxg         This plan has been reviewed and created by the following participants:  This plan will be reviewed at least every 12 months. Date Behavioral Health Clinician Date Guardian/Patient   03/07/2022  Ronal Jenkins Sprung, Ph.D.   03/07/2022 Timothy [name]  03/20/2023  Ronal Jenkins Sprung, Ph.D.  03/20/2023 Timothy [name]               Diagnoses:  Generalized Anxiety Disorder Phase of Life Change - Adult   Davidmichael reports that he has been feeling very anxious about work.  We d/e/p what's occurred, identified his anxious thoughts, how he's attempted to cope, and ways to manage more  effectively.  We also d/e/p the difficulties he is having managing his time now that he is between jobs.  I made some practical suggestions for how he might start his day out with a new a routine ,which includes physical activity, to assist with grounding, centering, motivation, and stamina..    Home Practice:  create a new work routine  Ronal Jenkins Sprung, PhD

## 2024-02-19 ENCOUNTER — Ambulatory Visit (INDEPENDENT_AMBULATORY_CARE_PROVIDER_SITE_OTHER): Admitting: Psychology

## 2024-02-19 DIAGNOSIS — F411 Generalized anxiety disorder: Secondary | ICD-10-CM | POA: Diagnosis not present

## 2024-02-19 NOTE — Progress Notes (Signed)
 PROGRESS NOTE:  Name: Timothy [name] Date: 02/19/2024 MRN: [id number] DOB: Apr 08, 1976 PCP: Loretha Richerd SAUNDERS, MD  Time spent: 8:01 AM - 8:59 AM  Annual Review: 03/19/2024  Today I met with  Timothy [name] in remote video (FaceTime) face-to-face individual psychotherapy due to technical issues.  Distance Site: Client's Home Orginating Site: Dr Edison Remote Office Consent: Obtained verbal consent to transmit session remotely.   Patient is aware of the limitations related to conducting virtual therapy.    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  Since the start of treatment, depression has resolved with periodic mild reoccurrences.  Anxiety however, has proven to be chronic and effects all areas of his life.  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  Unfortunately, he application for early retirement was denied and has created a new series of questions about his future work path.  Therefore, we have continued the process of discerning next steps in his career and professional development.  Timothy [name] mother is aging and he is beginning to see the signs of the need for additional support and negotiating way to provide the needed support and guidance.   Mental Status Exam: Appearance:   Casual and Fairly Groomed     Behavior:  Appropriate  Motor:  Normal  Speech/Language:   NA  Affect:  Appropriate  Mood:  anxious and irritable  Thought process:  normal  Thought content:    WNL  Sensory/Perceptual disturbances:    WNL  Orientation:  oriented to person, place, time/date, and situation  Attention:  Good  Concentration:  Good  Memory:  WNL  Fund of knowledge:   Good  Insight:    Good  Judgment:   Good  Impulse Control:  Good   Risk Assessment: Danger to Self:  No Self-injurious Behavior:  No Danger to Others: No Duty to Warn:no Physical Aggression / Violence:No  Access to Firearms a concern: No   Substance Abuse History: Current substance abuse: No     Past Psychiatric History:   Previous psychological history is significant for anxiety and depression Outpatient Providers: current therapist History of Psych Hospitalization: No  Psychological Testing: n/a   Abuse History:  Victim of: No., n/a   Report needed: No. Victim of Neglect:No. 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  If a parent, number of children / ages: [name] (66) they/them  Support Systems:  Spouse Friends Mother and Father-In-Law Daughter  Surveyor, quantity Stress:  Yes   Income/Employment/Disability: Employment  Financial planner: No   Educational History: Education: Risk manager: N/a  Any cultural differences that may affect / interfere with treatment:  not applicable   Recreation/Hobbies: D&D, movies  Stressors: Family Conflict, Aging parent  Barriers:  None   Legal History: Pending legal issue / charges: The patient has no significant history of legal issues. History of legal issue / charges: n/a  Medical History/Surgical History: reviewed Past Medical History:  Diagnosis Date   AKI (acute kidney injury) (HCC) 12/22/2015   Anxiety    Diabetes mellitus (HCC)    Essential hypertension    started on  lisinopril about 45 days prior to pancreatitis   Obesity    Pancreatitis, acute 12/22/2015   a. 11/2015 - acute severe pancreatitis with shock/sepsis, complicated by acute respiratory failure, transaminitis, anemia of critical illness, acute encephalopathy, AKI (Cr 1.36), protein-calorie malnutrition with severe hypoalbuminemia, & hypokalemia.   Splenic vein thrombosis     Past Surgical History:  Procedure Laterality Date   BACK SURGERY     ERCP N/A 01/31/2016   Procedure: ENDOSCOPIC RETROGRADE CHOLANGIOPANCREATOGRAPHY (ERCP);  Surgeon: Belvie Just, MD;  Location: [facility] ENDOSCOPY;  Service: Endoscopy;  Laterality: N/A;    Medications: Current Outpatient Medications  Medication Sig Dispense Refill   acetaminophen  (TYLENOL ) 325 MG tablet Take 650 mg by mouth every 6 (six) hours as needed for fever.     albuterol  (PROVENTIL  HFA;VENTOLIN  HFA) 108 (90 Base) MCG/ACT inhaler Inhale 1-2 puffs into the lungs every 6 (six) hours as needed for wheezing or shortness of breath. 1 Inhaler 0   blood glucose meter kit and supplies Dispense based on patient and insurance preference. Use up to four times daily as directed. (FOR ICD-9 250.00, 250.01). 1 each 0   carvedilol  (COREG ) 25 MG tablet Take 1 tablet (25 mg total) by mouth 2 (two) times daily. 180 tablet 3   cetirizine (ZYRTEC) 10 MG tablet Take 10 mg by mouth at bedtime.     fluticasone (FLONASE) 50 MCG/ACT nasal spray Place 1 spray into both nostrils at bedtime.     guaiFENesin -codeine  100-10 MG/5ML syrup Take 5 mLs by mouth at bedtime as needed for cough. 120 mL 0   HYDROmorphone  (DILAUDID ) 1 MG/ML injection Inject 1 mL (1 mg total) into the vein every 4 (four) hours as needed for severe pain. 1 mL 0   imipenem -cilastatin  500 mg in sodium chloride  0.9 % 100 mL Inject 500 mg into the vein every 6 (six) hours.     insulin  aspart (NOVOLOG ) 100 UNIT/ML FlexPen 8 units with meals (as long as eating 50%) PLUS the following scale: CBG 70 - 120: 0 units CBG 121  - 150: 2 units CBG 151 - 200: 3 units CBG 201 - 250: 5 units CBG 251 - 300: 8 units CBG 301 - 350: 11 units CBG 351 - 400: 15 units (Patient taking differently: Inject 8 Units into the skin 3 (three) times daily with meals. 8 units with meals (as long as eating 50%) PLUS the following scale: CBG 70 - 120: 0 units CBG 121 - 150: 2 units CBG 151 - 200: 3 units CBG 201 - 250: 5 units CBG 251 - 300: 8 units CBG 301 - 350: 11 units CBG 351 - 400: 15 units) 60 mL 11   Insulin  Glargine (LANTUS  SOLOSTAR) 100 UNIT/ML Solostar Pen Inject 25 Units into the skin daily at 10 pm. 15 mL 11   Insulin  Pen Needle 31G X 5 MM MISC For insulin  injections 100 each 1   pantoprazole  (PROTONIX ) 40 MG tablet Take 1 tablet (40 mg total) by mouth at bedtime. 30 tablet 0   No current facility-administered medications for this visit.    No Known Allergies  Individualized Treatment Plan                Strengths: verbal, bright, friendly  Supports: wife, daughter, parents   Goal/Needs for Treatment:  In order of importance to patient 1)  Resolve conflicted feelings, takes steps to change work situation and adapt to the new life circumstances.   2) Learn and implement coping skills that result in a reduction of anxiety and worry, and improved daily functioning.  3) Increase activities that reinforce a positive self-identity.  4) Learn and implement coping skills that result in a prevention of of relapse of depression and improve daily functioning.    Client Statement of Needs: work on pervasive issues with anxiety, assistance in making a big life change as he contemplates retiring from current position and figures out new direction   Treatment Level: Individual Biweekly Outpatient Psychotherapy  Symptoms: Autonomic hyperactivity (e.g., palpitations, shortness of breath, dry mouth, trouble swallowing, nausea, diarrhea).  Periodic periods of depressed or irritable mood.  Excessive and/or unrealistic  worry that is difficult to control occurring more days than not for at least 6 months about a number of events or activities.  Feelings of hopelessness, worthlessness, or inappropriate guilt.  Hypervigilance (e.g., feeling constantly on edge, experiencing concentration difficulties, having trouble falling or staying asleep, exhibiting a general state of irritability).  Low self-esteem.  Behavioral passivity, difficulties initiating action Motor tension (e.g., restlessness, tiredness, shakiness, muscle tension).  Restlessness and feelings of lost identity and meaning due to retirement.   Client Treatment Preferences: continue with current therapist   Healthcare consumer's goal for treatment:  Psychologist, Ronal Jenkins Sprung, Ph.D. will support the patient's ability to achieve the goals identified. Cognitive Behavioral Therapy, Dialectical Behavioral Therapy, Motivational Interviewing, Behavior Activation, parent skills, and other evidenced-based practices will be used to promote progress towards healthy functioning.   Healthcare consumer Tomie Elko will: Actively participate in therapy, working towards healthy functioning.    *Justification for Continuation/Discontinuation of Goal: R=Revised, O=Ongoing, A=Achieved, D=Discontinued  Goal 1) Resolve conflicted feelings, takes steps to change work situation and adapt to the new life circumstances.   5 Point Likert rating baseline date: 02/22/2021 Target Date Goal Was reviewed Status Code Progress towards goal/Likert rating  03/20/2023 03/07/2022          O 2/5 - pt has taken initial steps but at times falls into passivity and has made little progress  03/19/2024 03/20/2023          O 4/5 - pt has taken steps to move forward with early retirement, but now is challenged w/ a denial & the p/ begins again        Goal 2) Learn and implement coping skills that result in a reduction of anxiety and worry, and improved daily functioning.  5 Point Likert  rating baseline date: 02/23/2015 Target Date Goal Was reviewed Status Code Progress towards goal/Likert rating  03/20/2023 03/07/2022          O 3/5 - pt has learned coping skills and uses them appropriately but inconsistently  03/19/2024 03/20/2023          O 3.5 /5 - pt is better at identifying when he is anxious & more consistently uses his skills        Goal 3) Increase activities that reinforce a positive self-identity.   5 Point Likert rating baseline date: 02/23/2015 Target Date Goal Was reviewed Status Code Progress towards goal/Likert rating  03/20/2023 03/07/2022  O 2/5 - pt has taken initial steps to introduce new activities that have improved his feelings of positive self esteem  03/19/2024 03/20/2023          O 2.5/5 - pt has not taken any new steps but has recognized the need to do more & is making plans        Goal 4) Learn and implement coping skills that result in a prevention of of relapse of depression and improve  daily functioning.   5  Point Likert rating baseline date: 02/23/2015 Target Date Goal Was reviewed Status Code Progress towards goal/Likert rating   03/20/2023  03/07/2022            O  4/5 - pt has learned coping skills and uses them appropriately but inconsistently   03/19/2024  03/20/2023            O  3/5 - pt has regressed & has slipped back into old patterns which has effected his mood & fnxg         This plan has been reviewed and created by the following participants:  This plan will be reviewed at least every 12 months. Date Behavioral Health Clinician Date Guardian/Patient   03/07/2022  Ronal Jenkins Sprung, Ph.D.   03/07/2022 Timothy [name]  03/20/2023  Ronal Jenkins Sprung, Ph.D.  03/20/2023 Timothy [name]               Diagnoses:  Generalized Anxiety Disorder Phase of Life Change - Adult   Timothy [name] reports that he had been feeling very anxious about work.  He finally f/u and learned that he didn't get the job he applied for a second time.  We d/e/p  what's occurred, identified his anxious thoughts, how he's attempted to cope, and ways to manage more effectively.  We also d/e/p next steps and direct, practical ways to keep himself accountable and not fall in to characteristic procrastination.  Home Practice:  create a new work routine  Ronal Jenkins Sprung, PhD

## 2024-03-04 ENCOUNTER — Ambulatory Visit (INDEPENDENT_AMBULATORY_CARE_PROVIDER_SITE_OTHER): Admitting: Psychology

## 2024-03-04 DIAGNOSIS — Z600 Problems of adjustment to life-cycle transitions: Secondary | ICD-10-CM | POA: Diagnosis not present

## 2024-03-04 DIAGNOSIS — F411 Generalized anxiety disorder: Secondary | ICD-10-CM | POA: Diagnosis not present

## 2024-03-04 NOTE — Progress Notes (Signed)
 PROGRESS NOTE: Annual Review  Name: Timothy [name] Date: 03/04/2024 MRN: [id number] DOB: Oct 08, 1975 PCP: Timothy Richerd SAUNDERS, MD  Time spent: 8:00 AM - 8:58 AM  Annual Review: 03/04/2025  Today I met with  Timothy [name] in remote video (Careg[facility]) face-to-face individual psychotherapy.  Distance Site: Client's Home Orginating Site: Dr Edison Remote Office Consent: Obtained verbal consent to transmit session remotely.   Patient is aware of the limitations related to conducting virtual therapy.    Presenting Problem: Timothy [name] is a 48 y.o. WMM who started therapy several years ago seeking treatment for depression and anxiety.  Since the start of treatment, depression has resolved with periodic mild reoccurrences.  Anxiety however, has proven to be chronic and effects all areas of his life.  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  Unfortunately, he application for early retirement was denied and has created a new series of questions about his future work path.  Therefore, we have continued the process of discerning next steps in his career and professional development.  Timothy [name] is aging and he is beginning to see the signs of the need for additional support and negotiating way to provide the needed support and guidance.  Background History: Timothy [name] is married to [name] (53) and they have been married for 18 years.   Recently, Timothy [name] decided to go back to college to complete an undergraduate degree in education.  She already has her JD, but does not wish to return to practicing law.  While she has been happy with her decision to get a degree in education, it was a stressful time for the family, and Timothy [name] had learned to step up and help out more around the hourse.  Together they have one daughter, Timothy [name] who is a rising sophomore at [name], they are a talented musician and play the saxophone, as well as  other instruments in their high school band and jazz ensemble.  A few years ago, his father-in-law purchased a house that they could all live in together.  [name] is still adjusting to having his father-in-law live under the  same roof.  [name] only has one sibling, an adopted [name], with whom he has a strained relationship.  As their [name] is aging, their have been some small movements toward improving the relationship.  Both his [name] and brother live in [place] .  Mental Status Exam: Appearance:   Casual and Fairly Groomed     Behavior:  Appropriate  Motor:  Normal  Speech/Language:   NA  Affect:  Appropriate  Mood:  anxious and irritable  Thought process:  normal  Thought content:    WNL  Sensory/Perceptual disturbances:    WNL  Orientation:  oriented to person, place, time/date, and situation  Attention:  Good  Concentration:  Good  Memory:  WNL  Fund of knowledge:   Good  Insight:    Good  Judgment:   Good  Impulse Control:  Good   Risk Assessment: Danger to Self:  No Self-injurious Behavior: No Danger to Others: No Duty to Warn:no Physical Aggression / Violence:No  Access to Firearms a concern: No   Substance Abuse History: Current substance abuse: No     Past Psychiatric History:   Previous psychological history is significant for anxiety and depression Outpatient Providers: current therapist History of Psych Hospitalization: No  Psychological Testing: n/a   Abuse History:  Victim of: No., n/a  Report needed: No. Victim of Neglect:No. 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  If a parent, number of children / ages: Timothy [name] (28) they/them  Support Systems:  Spouse [name] and Licensed conveyancer Stress:  Yes   Income/Employment/Disability: Employment  Financial planner: No   Educational History: Education: Risk manager: N/a  Any cultural differences that may affect / interfere with treatment:  not applicable   Recreation/Hobbies: D&D, movies  Stressors: Family Conflict, Aging parent  Barriers:  None   Legal History: Pending legal issue / charges: The patient has no significant history of legal issues. History of legal issue / charges: n/a  Medical History/Surgical History: reviewed Past Medical History:  Diagnosis Date   AKI (acute kidney injury) (HCC) 12/22/2015   Anxiety    Diabetes mellitus (HCC)    Essential hypertension    started on lisinopril about 45 days prior to pancreatitis   Obesity    Pancreatitis, acute 12/22/2015   a. 11/2015 - acute severe pancreatitis with shock/sepsis, complicated by acute respiratory failure, transaminitis, anemia of critical illness, acute encephalopathy, AKI (Cr 1.36), protein-calorie malnutrition with severe hypoalbuminemia, & hypokalemia.   Splenic vein thrombosis     Past Surgical History:  Procedure Laterality Date   BACK SURGERY     ERCP N/A 01/31/2016   Procedure: ENDOSCOPIC RETROGRADE CHOLANGIOPANCREATOGRAPHY (ERCP);  Surgeon: Belvie Just, MD;  Location: [facility];  Service: Endoscopy;  Laterality: N/A;    Medications: Current Outpatient Medications  Medication Sig Dispense Refill   acetaminophen  (TYLENOL ) 325 MG tablet Take 650 mg by mouth every 6 (six) hours as needed for fever.     albuterol  (PROVENTIL  HFA;VENTOLIN  HFA) 108 (90 Base) MCG/ACT inhaler Inhale  1-2 puffs into the lungs every 6 (six) hours as needed for wheezing or shortness of breath. 1 Inhaler 0   blood glucose meter kit and supplies Dispense based on patient and insurance preference. Use up to four times daily as directed. (FOR ICD-9 250.00, 250.01). 1 each 0   carvedilol  (COREG ) 25 MG tablet Take 1 tablet (25 mg total) by mouth 2 (two) times daily. 180 tablet 3   cetirizine (ZYRTEC) 10 MG tablet Take 10 mg by mouth at bedtime.     fluticasone (FLONASE) 50 MCG/ACT nasal spray Place 1 spray into both nostrils at bedtime.     guaiFENesin -codeine  100-10 MG/5ML syrup Take 5 mLs by mouth at bedtime as needed for cough. 120 mL 0   HYDROmorphone  (DILAUDID ) 1 MG/ML injection Inject 1 mL (1 mg total) into the vein every 4 (four) hours as needed for severe pain. 1 mL 0   imipenem -cilastatin  500 mg in sodium chloride  0.9 % 100 mL Inject 500 mg into the vein every 6 (six) hours.     insulin  aspart (NOVOLOG ) 100 UNIT/ML FlexPen 8 units with meals (as long as eating 50%) PLUS the following scale: CBG  70 - 120: 0 units CBG 121 - 150: 2 units CBG 151 - 200: 3 units CBG 201 - 250: 5 units CBG 251 - 300: 8 units CBG 301 - 350: 11 units CBG 351 - 400: 15 units (Patient taking differently: Inject 8 Units into the skin 3 (three) times daily with meals. 8 units with meals (as long as eating 50%) PLUS the following scale: CBG 70 - 120: 0 units CBG 121 - 150: 2 units CBG 151 - 200: 3 units CBG 201 - 250: 5 units CBG 251 - 300: 8 units CBG 301 - 350: 11 units CBG 351 - 400: 15 units) 60 mL 11   Insulin  Glargine (LANTUS  SOLOSTAR) 100 UNIT/ML Solostar Pen Inject 25 Units into the skin daily at 10 pm. 15 mL 11   Insulin  Pen Needle 31G X 5 MM MISC For insulin  injections 100 each 1   pantoprazole  (PROTONIX ) 40 MG tablet Take 1 tablet (40 mg total) by mouth at bedtime. 30 tablet 0   No current facility-administered medications for this visit.    No Known Allergies    Individualized Treatment  Plan                Strengths: verbal, bright, friendly  Supports: wife, daughter, parents   Goal/Needs for Treatment:  In order of importance to patient 1)  Resolve conflicted feelings, takes steps to change work situation and adapt to the new life circumstances.   2) Learn and implement coping skills that result in a reduction of anxiety and worry, and improved daily functioning.  3) Increase activities that reinforce a positive self-identity.  4) Learn and implement coping skills that result in a prevention of of relapse of depression and improve daily functioning.    Client Statement of Needs: work on pervasive issues with anxiety, assistance in making a big life change as he contemplates retiring from current position and figures out new direction   Treatment Level: Individual Biweekly Outpatient Psychotherapy  Symptoms: Autonomic hyperactivity (e.g., palpitations, shortness of breath, dry mouth, trouble swallowing, nausea, diarrhea).  Periodic periods of depressed or irritable mood.  Excessive and/or unrealistic worry that is difficult to control occurring more days than not for at least 6 months about a number of events or activities.  Feelings of hopelessness, worthlessness, or inappropriate guilt.  Hypervigilance (e.g., feeling constantly on edge, experiencing concentration difficulties, having trouble falling or staying asleep, exhibiting a general state of irritability).  Low self-esteem.  Behavioral passivity, difficulties initiating action Motor tension (e.g., restlessness, tiredness, shakiness, muscle tension).  Restlessness and feelings of lost identity and meaning [name] to retirement.   Client Treatment Preferences: continue with current therapist   Healthcare consumer's goal for treatment:  Psychologist, Timothy [name], Ph.D. will support the patient's ability to achieve the goals identified. Cognitive Behavioral Therapy, Dialectical Behavioral Therapy, Motivational  Interviewing, Behavior Activation, parent skills, and other evidenced-based practices will be used to promote progress towards healthy functioning.   Healthcare consumer Timothy [name] will: Actively participate in therapy, working towards healthy functioning.    *Justification for Continuation/Discontinuation of Goal: R=Revised, O=Ongoing, A=Achieved, D=Discontinued  Goal 1) Resolve conflicted feelings, takes steps to change work situation and adapt to the new life circumstances.   5 Point Likert rating baseline date: 02/22/2021 Target Date Goal Was reviewed Status Code Progress towards goal/Likert rating  03/20/2023 03/07/2022          O 2/5 - pt has taken initial steps but at times falls into passivity and  has made little progress  03/19/2024 03/20/2023          O 4/5 - pt has taken steps to move forward with early retirement, but now is challenged w/ a denial & the p/ begins again   03/04/2024          D 5/5 - pt f/t and has taken his early retirement and has actively concluded his leave   Goal 2) Learn and implement coping skills that result in a reduction of anxiety and worry, and improved daily functioning.  5 Point Likert rating baseline date: 02/23/2015 Target Date Goal Was reviewed Status Code Progress towards goal/Likert rating  03/20/2023 03/07/2022          O 3/5 - pt has learned coping skills and uses them appropriately but inconsistently  03/19/2024 03/20/2023          O 3.5 /5 - pt is better at identifying when he is anxious & more consistently uses his skills  03/04/2025 03/04/2024          O 4/5/- pt continues to be able to identify his anxiety and more quick is able to intervene to manage his anxiety   Goal 3) Increase activities that reinforce a positive self-identity.   5 Point Likert rating baseline date: 02/23/2015 Target Date Goal Was reviewed Status Code Progress towards goal/Likert rating  03/20/2023 03/07/2022          O 2/5 - pt has taken initial steps to introduce new  activities that have improved his feelings of positive self esteem  03/19/2024 03/20/2023          O 2.5/5 - pt has not taken any new steps but has recognized the need to do more & is making plans  03/04/2025 03/04/2024          O 3/5 - pt has improved his planning and is participating in more activities   Goal 4) Learn and implement coping skills that result in a prevention of of relapse of depression and improve  daily functioning.   5  Point Likert rating baseline date: 02/23/2015 Target Date Goal Was reviewed Status Code Progress towards goal/Likert rating   03/20/2023  03/07/2022            O  4/5 - pt has learned coping skills and uses them appropriately but inconsistently   03/19/2024  03/20/2023            O  3/5 - pt has regressed & has slipped back into old patterns which has effected his mood & fnxg   03/04/2025  03/04/2024            O  4/5 - pt has taken steps to more actively participate in family life and made changes in his work life that have made him less stressed   Goal 5) Resolve conflicted feelings, takes steps to change work situation and adapt to the new life circumstances.   5 Point Likert rating baseline date: 03/04/2024 Target Date Goal Was reviewed Status Code Progress towards goal/Likert rating  03/04/2025 03/04/2024          N                                  This plan has been reviewed and created by the following participants:  This plan will be reviewed at least every 12 months. Date Behavioral Health Clinician Date Guardian/Patient  03/07/2022  Timothy [name], Ph.D.   03/07/2022 Timothy [name]  03/20/2023  Timothy [name], Ph.D.  03/20/2023 Timothy [name]  03/04/2024 Timothy [name], Ph.D.  03/04/2024 Timothy [name]          Diagnoses:  Generalized Anxiety Disorder Phase of Life Change - Adult  In session today, conducted pt's annual review.  We reviewed Urias's progress, d/ goals and updated her treatment plan.  He actively participated in the creation of his  treatment plan and freely gave his consent.  Pt was told to expect an electronic document indicating that we reviewed his progress in therapy, updated their goals, set new goals as was appropriate to their needs which required his signature.  Mercer reports that there were a number of issues between himself and his wife this past week.  We d/e/p what occurred,   Home Practice:  create a new work routine  Timothy Jenkins Sprung, PhD

## 2024-03-18 ENCOUNTER — Ambulatory Visit (INDEPENDENT_AMBULATORY_CARE_PROVIDER_SITE_OTHER): Admitting: Psychology

## 2024-03-18 DIAGNOSIS — Z600 Problems of adjustment to life-cycle transitions: Secondary | ICD-10-CM | POA: Diagnosis not present

## 2024-03-18 DIAGNOSIS — F411 Generalized anxiety disorder: Secondary | ICD-10-CM

## 2024-03-18 NOTE — Progress Notes (Signed)
 PROGRESS NOTE:  Name: Timothy [name] Date: 03/18/2024 MRN: [id number] DOB: 11/22/1975 PCP: Loretha Richerd SAUNDERS, MD  Time spent: 8:01 AM - 8:59 AM  Annual Review: 03/04/2025  Today I met with  Timothy [name] in remote video (Careg[facility]) face-to-face individual psychotherapy.  Distance Site: Client's Home Orginating Site: Dr Edison Remote Office Consent: Obtained verbal consent to transmit session remotely.   Patient is aware of the limitations related to conducting virtual therapy.    Presenting Problem: Timothy [name] is a 48 y.o. WMM who started therapy several years ago seeking treatment for depression and anxiety.  Since the start of treatment, depression has resolved with periodic mild reoccurrences.  Anxiety however, has proven to be chronic and effects all areas of his life.  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  Unfortunately, he application for early retirement was denied and has created a new series of questions about his future work path.  Therefore, we have continued the process of discerning next steps in his career and professional development.  Timothy [name]'s mother is aging and he is beginning to see the signs of the need for additional support and negotiating way to provide the needed support and guidance.  Background History: Timothy [name] is married to Timothy [name] (53) and they have been married for 18 years.   Recently, [name] decided to go back to college to complete an undergraduate degree in education.  She already has her JD, but does not wish to return to practicing law.  While she has been happy with her decision to get a degree in education, it was a stressful time for the family, and Timothy [name] had learned to step up and help out more around the hourse.  Together they have one daughter, Timothy [name] (44) who is a rising sophomore at [employer], they are a talented musician and play the saxophone, as well as other  instruments in their high school band and jazz ensemble.  A few years ago, his father-in-law purchased a house that they could all live in together.  Timothy [name] is still adjusting to having his father-in-law live under the  same roof.  Timothy [name] only has one sibling, an adopted [name], with whom he has a strained relationship.  As their mother is aging, their have been some small movements toward improving the relationship.  Both his mother and brother live in [place] .  Mental Status Exam: Appearance:   Casual and Fairly Groomed     Behavior:  Appropriate  Motor:  Normal  Speech/Language:   NA  Affect:  Appropriate  Mood:  anxious and irritable  Thought process:  normal  Thought content:    WNL  Sensory/Perceptual disturbances:    WNL  Orientation:  oriented to person, place, time/date, and situation  Attention:  Good  Concentration:  Good  Memory:  WNL  Fund of knowledge:   Good  Insight:    Good  Judgment:   Good  Impulse Control:  Good   Risk Assessment: Danger to Self:  No Self-injurious Behavior: No Danger to Others: No Duty to Warn:no Physical Aggression / Violence:No  Access to Firearms a concern: No   Substance Abuse History: Current substance abuse: No     Past Psychiatric History:   Previous psychological history is significant for anxiety and depression Outpatient Providers: current therapist History of Psych Hospitalization: No  Psychological Testing: n/a   Abuse History:  Victim of: No., n/a   Report  needed: No. Victim of Neglect:No. 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  If a parent, number of children / ages: Timothy [name] (90) they/them  Support Systems:  Spouse Friends Mother and Licensed conveyancer Stress:  Yes   Income/Employment/Disability: Employment  Financial planner: No   Educational History: Education: Risk manager: N/a  Any cultural differences that may affect / interfere with treatment:  not applicable   Recreation/Hobbies: D&D, movies  Stressors: Family Conflict, Aging parent  Barriers:  None   Legal History: Pending legal issue / charges: The patient has no significant history of legal issues. History of legal issue / charges: n/a  Medical History/Surgical History: reviewed Past Medical History:  Diagnosis Date   AKI (acute kidney injury) (HCC) 12/22/2015   Anxiety    Diabetes mellitus (HCC)    Essential hypertension    started on lisinopril about 45 days prior to pancreatitis   Obesity    Pancreatitis, acute 12/22/2015   a. 11/2015 - acute severe pancreatitis with shock/sepsis, complicated by acute respiratory failure, transaminitis, anemia of critical illness, acute encephalopathy, AKI (Cr 1.36), protein-calorie malnutrition with severe hypoalbuminemia, & hypokalemia.   Splenic vein thrombosis     Past Surgical History:  Procedure Laterality Date   BACK SURGERY     ERCP N/A 01/31/2016   Procedure: ENDOSCOPIC RETROGRADE CHOLANGIOPANCREATOGRAPHY (ERCP);  Surgeon: Belvie Just, MD;  Location: [facility] ENDOSCOPY;  Service: Endoscopy;  Laterality: N/A;    Medications: Current Outpatient Medications  Medication Sig Dispense Refill   acetaminophen  (TYLENOL ) 325 MG tablet Take 650 mg by mouth every 6 (six) hours as needed for fever.     albuterol  (PROVENTIL  HFA;VENTOLIN  HFA) 108 (90 Base) MCG/ACT inhaler Inhale 1-2 puffs  into the lungs every 6 (six) hours as needed for wheezing or shortness of breath. 1 Inhaler 0   blood glucose meter kit and supplies Dispense based on patient and insurance preference. Use up to four times daily as directed. (FOR ICD-9 250.00, 250.01). 1 each 0   carvedilol  (COREG ) 25 MG tablet Take 1 tablet (25 mg total) by mouth 2 (two) times daily. 180 tablet 3   cetirizine (ZYRTEC) 10 MG tablet Take 10 mg by mouth at bedtime.     fluticasone (FLONASE) 50 MCG/ACT nasal spray Place 1 spray into both nostrils at bedtime.     guaiFENesin -codeine  100-10 MG/5ML syrup Take 5 mLs by mouth at bedtime as needed for cough. 120 mL 0   HYDROmorphone  (DILAUDID ) 1 MG/ML injection Inject 1 mL (1 mg total) into the vein every 4 (four) hours as needed for severe pain. 1 mL 0   imipenem -cilastatin  500 mg in sodium chloride  0.9 % 100 mL Inject 500 mg into the vein every 6 (six) hours.     insulin  aspart (NOVOLOG ) 100 UNIT/ML FlexPen 8 units with meals (as long as eating 50%) PLUS the following scale: CBG 70 -  120: 0 units CBG 121 - 150: 2 units CBG 151 - 200: 3 units CBG 201 - 250: 5 units CBG 251 - 300: 8 units CBG 301 - 350: 11 units CBG 351 - 400: 15 units (Patient taking differently: Inject 8 Units into the skin 3 (three) times daily with meals. 8 units with meals (as long as eating 50%) PLUS the following scale: CBG 70 - 120: 0 units CBG 121 - 150: 2 units CBG 151 - 200: 3 units CBG 201 - 250: 5 units CBG 251 - 300: 8 units CBG 301 - 350: 11 units CBG 351 - 400: 15 units) 60 mL 11   Insulin  Glargine (LANTUS  SOLOSTAR) 100 UNIT/ML Solostar Pen Inject 25 Units into the skin daily at 10 pm. 15 mL 11   Insulin  Pen Needle 31G X 5 MM MISC For insulin  injections 100 each 1   pantoprazole  (PROTONIX ) 40 MG tablet Take 1 tablet (40 mg total) by mouth at bedtime. 30 tablet 0   No current facility-administered medications for this visit.    No Known Allergies    Individualized Treatment  Plan                Strengths: verbal, bright, friendly  Supports: wife, daughter, parents   Goal/Needs for Treatment:  In order of importance to patient 1)  Resolve conflicted feelings, takes steps to change work situation and adapt to the new life circumstances.   2) Learn and implement coping skills that result in a reduction of anxiety and worry, and improved daily functioning.  3) Increase activities that reinforce a positive self-identity.  4) Learn and implement coping skills that result in a prevention of of relapse of depression and improve daily functioning.    Client Statement of Needs: work on pervasive issues with anxiety, assistance in making a big life change as he contemplates retiring from current position and figures out new direction   Treatment Level: Individual Biweekly Outpatient Psychotherapy  Symptoms: Autonomic hyperactivity (e.g., palpitations, shortness of breath, dry mouth, trouble swallowing, nausea, diarrhea).  Periodic periods of depressed or irritable mood.  Excessive and/or unrealistic worry that is difficult to control occurring more days than not for at least 6 months about a number of events or activities.  Feelings of hopelessness, worthlessness, or inappropriate guilt.  Hypervigilance (e.g., feeling constantly on edge, experiencing concentration difficulties, having trouble falling or staying asleep, exhibiting a general state of irritability).  Low self-esteem.  Behavioral passivity, difficulties initiating action Motor tension (e.g., restlessness, tiredness, shakiness, muscle tension).  Restlessness and feelings of lost identity and meaning due to retirement.   Client Treatment Preferences: continue with current therapist   Healthcare consumer's goal for treatment:  Psychologist, Ronal Jenkins Sprung, Ph.D. will support the patient's ability to achieve the goals identified. Cognitive Behavioral Therapy, Dialectical Behavioral Therapy, Motivational  Interviewing, Behavior Activation, parent skills, and other evidenced-based practices will be used to promote progress towards healthy functioning.   Healthcare consumer Ollivander See will: Actively participate in therapy, working towards healthy functioning.    *Justification for Continuation/Discontinuation of Goal: R=Revised, O=Ongoing, A=Achieved, D=Discontinued  Goal 1) Resolve conflicted feelings, takes steps to change work situation and adapt to the new life circumstances.   5 Point Likert rating baseline date: 02/22/2021 Target Date Goal Was reviewed Status Code Progress towards goal/Likert rating  03/20/2023 03/07/2022          O 2/5 - pt has taken initial steps but at times falls into passivity and has made  little progress  03/19/2024 03/20/2023          O 4/5 - pt has taken steps to move forward with early retirement, but now is challenged w/ a denial & the p/ begins again   03/04/2024          D 5/5 - pt f/t and has taken his early retirement and has actively concluded his leave   Goal 2) Learn and implement coping skills that result in a reduction of anxiety and worry, and improved daily functioning.  5 Point Likert rating baseline date: 02/23/2015 Target Date Goal Was reviewed Status Code Progress towards goal/Likert rating  03/20/2023 03/07/2022          O 3/5 - pt has learned coping skills and uses them appropriately but inconsistently  03/19/2024 03/20/2023          O 3.5 /5 - pt is better at identifying when he is anxious & more consistently uses his skills  03/04/2025 03/04/2024          O 4/5/- pt continues to be able to identify his anxiety and more quick is able to intervene to manage his anxiety   Goal 3) Increase activities that reinforce a positive self-identity.   5 Point Likert rating baseline date: 02/23/2015 Target Date Goal Was reviewed Status Code Progress towards goal/Likert rating  03/20/2023 03/07/2022          O 2/5 - pt has taken initial steps to introduce new  activities that have improved his feelings of positive self esteem  03/19/2024 03/20/2023          O 2.5/5 - pt has not taken any new steps but has recognized the need to do more & is making plans  03/04/2025 03/04/2024          O 3/5 - pt has improved his planning and is participating in more activities   Goal 4) Learn and implement coping skills that result in a prevention of of relapse of depression and improve  daily functioning.   5  Point Likert rating baseline date: 02/23/2015 Target Date Goal Was reviewed Status Code Progress towards goal/Likert rating   03/20/2023  03/07/2022            O  4/5 - pt has learned coping skills and uses them appropriately but inconsistently   03/19/2024  03/20/2023            O  3/5 - pt has regressed & has slipped back into old patterns which has effected his mood & fnxg   03/04/2025  03/04/2024            O  4/5 - pt has taken steps to more actively participate in family life and made changes in his work life that have made him less stressed   Goal 5) Resolve conflicted feelings, takes steps to change work situation and adapt to the new life circumstances.   5 Point Likert rating baseline date: 03/04/2024 Target Date Goal Was reviewed Status Code Progress towards goal/Likert rating  03/04/2025 03/04/2024          N                                  This plan has been reviewed and created by the following participants:  This plan will be reviewed at least every 12 months. Date Behavioral Health Clinician Date Guardian/Patient   03/07/2022  Ronal Jenkins Sprung, Ph.D.   03/07/2022 Timothy [name]  03/20/2023  Ronal Jenkins Sprung, Ph.D.  03/20/2023 Timothy [name]  03/04/2024 Ronal Jenkins Sprung, Ph.D.  03/04/2024 Timothy [name]          Diagnoses:  Generalized Anxiety Disorder Phase of Life Change - Adult   Normal reports that he f/t and has created a daily routine for himself.  It seems to have helped to have some structure to his day.  We d/e/p his upcoming trip to [place]   to assist his mother during her radiation treatments.  Last, we d/e/p how to maintain a routine while he is gone, scheduling breaks from his mom, and making time to see old friends.  Home Practice:  create a new work routine  Ronal Jenkins Sprung, PhD

## 2024-04-15 ENCOUNTER — Ambulatory Visit (INDEPENDENT_AMBULATORY_CARE_PROVIDER_SITE_OTHER): Admitting: Psychology

## 2024-04-15 DIAGNOSIS — Z600 Problems of adjustment to life-cycle transitions: Secondary | ICD-10-CM | POA: Diagnosis not present

## 2024-04-15 DIAGNOSIS — F411 Generalized anxiety disorder: Secondary | ICD-10-CM | POA: Diagnosis not present

## 2024-04-15 NOTE — Progress Notes (Signed)
 PROGRESS NOTE:  Name: Timothy [name] Date: 04/15/2024 MRN: [id number] DOB: 1975-10-26 PCP: Loretha Richerd SAUNDERS, MD  Time spent: 8:00 AM - 8:58 AM  Annual Review: 03/04/2025  Today I met with  Timothy [name] in remote video (Careg[facility]) face-to-face individual psychotherapy.  Distance Site: Client's Home Orginating Site: Dr Edison Remote Office Consent: Obtained verbal consent to transmit session remotely.   Patient is aware of the limitations related to conducting virtual therapy.    Presenting Problem: Timothy [name] is a 48 y.o. WMM who started therapy several years ago seeking treatment for depression and anxiety.  Since the start of treatment, depression has resolved with periodic mild reoccurrences.  Anxiety however, has proven to be chronic and effects all areas of his life.  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  Unfortunately, he application for early retirement was denied and has created a new series of questions about his future work path.  Therefore, we have continued the process of discerning next steps in his career and professional development.  Timothy [name] mother is aging and he is beginning to see the signs of the need for additional support and negotiating way to provide the needed support and guidance.  Background History: Timothy [name] is married to Timothy [name] (53) and they have been married for 18 years.   Recently, Timothy [name] decided to go back to college to complete an undergraduate degree in education.  She already has her JD, but does not wish to return to practicing law.  While she has been happy with her decision to get a degree in education, it was a stressful time for the family, and Timothy [name] to step up and help out more around the hourse.  Together they have one daughter, Timothy [name] (39) who is a rising sophomore at [name], they are a talented musician and play the saxophone, as well as other  instruments in their high school band and jazz ensemble.  A few years ago, his father-in-law purchased a house that they could all live in together.  Timothy [name] is still adjusting to having his father-in-law live under the  same roof.  Timothy [name] only has one sibling, an adopted [name], with whom he has a strained relationship.  As their mother is aging, their have been some small movements toward improving the relationship.  Both his mother and brother live in [place] .  Mental Status Exam: Appearance:   Casual and Fairly Groomed     Behavior:  Appropriate  Motor:  Normal  Speech/Language:   NA  Affect:  Appropriate  Mood:  anxious and irritable  Thought process:  normal  Thought content:    WNL  Sensory/Perceptual disturbances:    WNL  Orientation:  oriented to person, place, time/date, and situation  Attention:  Good  Concentration:  Good  Memory:  WNL  Fund of knowledge:   Good  Insight:    Good  Judgment:   Good  Impulse Control:  Good   Risk Assessment: Danger to Self:  No Self-injurious Behavior: No Danger to Others: No Duty to Warn:no Physical Aggression / Violence:No  Access to Firearms a concern: No   Substance Abuse History: Current substance abuse: No     Past Psychiatric History:   Previous psychological history is significant for anxiety and depression Outpatient Providers: current therapist History of Psych Hospitalization: No  Psychological Testing: n/a   Abuse History:  Victim of: No., n/a   Report  needed: No. Victim of Neglect:No. 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  If a parent, number of children / ages: Timothy [name] (63) they/them  Support Systems:  Spouse Friends Mother and Licensed conveyancer Stress:  Yes   Income/Employment/Disability: Employment  Financial planner: No   Educational History: Education: Risk manager: N/a  Any cultural differences that may affect / interfere with treatment:  not applicable   Recreation/Hobbies: D&D, movies  Stressors: Family Conflict, Aging parent  Barriers:  None   Legal History: Pending legal issue / charges: The patient has no significant history of legal issues. History of legal issue / charges: n/a  Medical History/Surgical History: reviewed Past Medical History:  Diagnosis Date   AKI (acute kidney injury) (HCC) 12/22/2015   Anxiety    Diabetes mellitus (HCC)    Essential hypertension    started on lisinopril about 45 days prior to pancreatitis   Obesity    Pancreatitis, acute 12/22/2015   a. 11/2015 - acute severe pancreatitis with shock/sepsis, complicated by acute respiratory failure, transaminitis, anemia of critical illness, acute encephalopathy, AKI (Cr 1.36), protein-calorie malnutrition with severe hypoalbuminemia, & hypokalemia.   Splenic vein thrombosis     Past Surgical History:  Procedure Laterality Date   BACK SURGERY     ERCP N/A 01/31/2016   Procedure: ENDOSCOPIC RETROGRADE CHOLANGIOPANCREATOGRAPHY (ERCP);  Surgeon: Belvie Just, MD;  Location[facility]COPY;  Service: Endoscopy;  Laterality: N/A;    Medications: Current Outpatient Medications  Medication Sig Dispense Refill   acetaminophen  (TYLENOL ) 325 MG tablet Take 650 mg by mouth every 6 (six) hours as needed for fever.     albuterol  (PROVENTIL  HFA;VENTOLIN  HFA) 108 (90 Base) MCG/ACT inhaler Inhale 1-2 puffs  into the lungs every 6 (six) hours as needed for wheezing or shortness of breath. 1 Inhaler 0   blood glucose meter kit and supplies Dispense based on patient and insurance preference. Use up to four times daily as directed. (FOR ICD-9 250.00, 250.01). 1 each 0   carvedilol  (COREG ) 25 MG tablet Take 1 tablet (25 mg total) by mouth 2 (two) times daily. 180 tablet 3   cetirizine (ZYRTEC) 10 MG tablet Take 10 mg by mouth at bedtime.     fluticasone (FLONASE) 50 MCG/ACT nasal spray Place 1 spray into both nostrils at bedtime.     guaiFENesin -codeine  100-10 MG/5ML syrup Take 5 mLs by mouth at bedtime as needed for cough. 120 mL 0   HYDROmorphone  (DILAUDID ) 1 MG/ML injection Inject 1 mL (1 mg total) into the vein every 4 (four) hours as needed for severe pain. 1 mL 0   imipenem -cilastatin  500 mg in sodium chloride  0.9 % 100 mL Inject 500 mg into the vein every 6 (six) hours.     insulin  aspart (NOVOLOG ) 100 UNIT/ML FlexPen 8 units with meals (as long as eating 50%) PLUS the following scale: CBG 70 -  120: 0 units CBG 121 - 150: 2 units CBG 151 - 200: 3 units CBG 201 - 250: 5 units CBG 251 - 300: 8 units CBG 301 - 350: 11 units CBG 351 - 400: 15 units (Patient taking differently: Inject 8 Units into the skin 3 (three) times daily with meals. 8 units with meals (as long as eating 50%) PLUS the following scale: CBG 70 - 120: 0 units CBG 121 - 150: 2 units CBG 151 - 200: 3 units CBG 201 - 250: 5 units CBG 251 - 300: 8 units CBG 301 - 350: 11 units CBG 351 - 400: 15 units) 60 mL 11   Insulin  Glargine (LANTUS  SOLOSTAR) 100 UNIT/ML Solostar Pen Inject 25 Units into the skin daily at 10 pm. 15 mL 11   Insulin  Pen Needle 31G X 5 MM MISC For insulin  injections 100 each 1   pantoprazole  (PROTONIX ) 40 MG tablet Take 1 tablet (40 mg total) by mouth at bedtime. 30 tablet 0   No current facility-administered medications for this visit.    No Known Allergies    Individualized Treatment  Plan                Strengths: verbal, bright, friendly  Supports: wife, daughter, parents   Goal/Needs for Treatment:  In order of importance to patient 1)  Resolve conflicted feelings, takes steps to change work situation and adapt to the new life circumstances.   2) Learn and implement coping skills that result in a reduction of anxiety and worry, and improved daily functioning.  3) Increase activities that reinforce a positive self-identity.  4) Learn and implement coping skills that result in a prevention of of relapse of depression and improve daily functioning.    Client Statement of Needs: work on pervasive issues with anxiety, assistance in making a big life change as he contemplates retiring from current position and figures out new direction   Treatment Level: Individual Biweekly Outpatient Psychotherapy  Symptoms: Autonomic hyperactivity (e.g., palpitations, shortness of breath, dry mouth, trouble swallowing, nausea, diarrhea).  Periodic periods of depressed or irritable mood.  Excessive and/or unrealistic worry that is difficult to control occurring more days than not for at least 6 months about a number of events or activities.  Feelings of hopelessness, worthlessness, or inappropriate guilt.  Hypervigilance (e.g., feeling constantly on edge, experiencing concentration difficulties, having trouble falling or staying asleep, exhibiting a general state of irritability).  Low self-esteem.  Behavioral passivity, difficulties initiating action Motor tension (e.g., restlessness, tiredness, shakiness, muscle tension).  Restlessness and feelings of lost identity and meaning due to retirement.   Client Treatment Preferences: continue with current therapist   Healthcare consumer's goal for treatment:  Psychologist, Timothy [name], Ph.D. will support the patient's ability to achieve the goals identified. Cognitive Behavioral Therapy, Dialectical Behavioral Therapy, Motivational  Interviewing, Behavior Activation, parent skills, and other evidenced-based practices will be used to promote progress towards healthy functioning.   Healthcare consumer Timothy [name] will: Actively participate in therapy, working towards healthy functioning.    *Justification for Continuation/Discontinuation of Goal: R=Revised, O=Ongoing, A=Achieved, D=Discontinued  Goal 1) Resolve conflicted feelings, takes steps to change work situation and adapt to the new life circumstances.   5 Point Likert rating baseline date: 02/22/2021 Target Date Goal Was reviewed Status Code Progress towards goal/Likert rating  03/20/2023 03/07/2022          O 2/5 - pt has taken initial steps but at times falls into passivity and has made  little progress  03/19/2024 03/20/2023          O 4/5 - pt has taken steps to move forward with early retirement, but now is challenged w/ a denial & the p/ begins again   03/04/2024          D 5/5 - pt f/t and has taken his early retirement and has actively concluded his leave   Goal 2) Learn and implement coping skills that result in a reduction of anxiety and worry, and improved daily functioning.  5 Point Likert rating baseline date: 02/23/2015 Target Date Goal Was reviewed Status Code Progress towards goal/Likert rating  03/20/2023 03/07/2022          O 3/5 - pt has [name] coping skills and uses them appropriately but inconsistently  03/19/2024 03/20/2023          O 3.5 /5 - pt is better at identifying when he is anxious & more consistently uses his skills  03/04/2025 03/04/2024          O 4/5/- pt continues to be able to identify his anxiety and more quick is able to intervene to manage his anxiety   Goal 3) Increase activities that reinforce a positive self-identity.   5 Point Likert rating baseline date: 02/23/2015 Target Date Goal Was reviewed Status Code Progress towards goal/Likert rating  03/20/2023 03/07/2022          O 2/5 - pt has taken initial steps to introduce new  activities that have improved his feelings of positive self esteem  03/19/2024 03/20/2023          O 2.5/5 - pt has not taken any new steps but has recognized the need to do more & is making plans  03/04/2025 03/04/2024          O 3/5 - pt has improved his planning and is participating in more activities   Goal 4) Learn and implement coping skills that result in a prevention of of relapse of depression and improve  daily functioning.   5  Point Likert rating baseline date: 02/23/2015 Target Date Goal Was reviewed Status Code Progress towards goal/Likert rating   03/20/2023  03/07/2022            O  4/5 - pt has [name] coping skills and uses them appropriately but inconsistently   03/19/2024  03/20/2023            O  3/5 - pt has regressed & has slipped back into old patterns which has effected his mood & fnxg   03/04/2025  03/04/2024            O  4/5 - pt has taken steps to more actively participate in family life and made changes in his work life that have made him less stressed   Goal 5) Resolve conflicted feelings, takes steps to change work situation and adapt to the new life circumstances.   5 Point Likert rating baseline date: 03/04/2024 Target Date Goal Was reviewed Status Code Progress towards goal/Likert rating  03/04/2025 03/04/2024          N                                  This plan has been reviewed and created by the following participants:  This plan will be reviewed at least every 12 months. Date Behavioral Health Clinician Date Guardian/Patient   03/07/2022  Timothy [name], Ph.D.   03/07/2022 Timothy [name]  03/20/2023  Timothy [name], Ph.D.  03/20/2023 Timothy [name]  03/04/2024 Timothy [name], Ph.D.  03/04/2024 Timothy [name]          Diagnoses:  Generalized Anxiety Disorder Phase of Life Change - Adult   Timothy [name] reports that he has been busy applying for jobs and networking on [employer].  He states that he once again heard from a company he interviewed with for a  different position.  We d/e/p what occurred, how he felt, adjusting how he responds, and the need to practice acceptance.  We also d/e/p his time up in [place]  while his mother underwent chemotherapy.  He turned 48 while he was away and he brother offered to do something for his birthday.  We d/e/p how he wasn't able to completely relax and enjoy his party, childhood family dynamics with his brother, and trauma responses.  I provided the support and guidance needed by patient.   Home Practice:  create a new work routine  Timothy Jenkins Sprung, PhD

## 2024-04-29 ENCOUNTER — Ambulatory Visit (INDEPENDENT_AMBULATORY_CARE_PROVIDER_SITE_OTHER): Admitting: Psychology

## 2024-04-29 DIAGNOSIS — Z600 Problems of adjustment to life-cycle transitions: Secondary | ICD-10-CM | POA: Diagnosis not present

## 2024-04-29 DIAGNOSIS — F411 Generalized anxiety disorder: Secondary | ICD-10-CM

## 2024-04-29 NOTE — Progress Notes (Signed)
 PROGRESS NOTE:  Name: Timothy [name] Date: 04/29/2024 MRN: [id number] DOB: July 10, 1976 PCP: Loretha Richerd SAUNDERS, MD  Time spent: 8:00 AM - 8:58 AM  Annual Review: 03/04/2025   Today I met with  Timothy [name] in remote video ([facility]) face-to-face individual psychotherapy.  Distance Site: Client's Home Orginating Site: Dr Edison Remote Office Consent: Obtained verbal consent to transmit session remotely.   Patient is aware of the limitations related to conducting virtual therapy.    Presenting Problem: Timothy [name] is a 48 y.o. WMM who started therapy several years ago seeking treatment for depression and anxiety.  Since the start of treatment, depression has resolved with periodic mild reoccurrences.  Anxiety however, has proven to be chronic and effects all areas of his life.  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  Unfortunately, he application for early retirement was denied and has created a new series of questions about his future work path.  Therefore, we have continued the process of discerning next steps in his career and professional development.  Timothy [name]'s mother is aging and he is beginning to see the signs of the need for additional support and negotiating way to provide the needed support and guidance.  Background History: Timothy [name] is married to Timothy [name] (53) and they have been married for 18 years.   Recently, Timothy [name] decided to go back to college to complete an undergraduate degree in education.  She already has her JD, but does not wish to return to practicing law.  While she has been happy with her decision to get a degree in education, it was a stressful time for the family, and Timothy [name] had learned to step up and help out more around the hourse.  Together they have one daughter, Timothy [name] (55) who is a rising sophomore at [name], they are a talented musician and play the saxophone, as well as  other instruments in their high school band and jazz ensemble.  A few years ago, his father-in-law purchased a house that they could all live in together.  Timothy [name] is still adjusting to having his father-in-law live under the  same roof.  Timothy [name] only has one sibling, an adopted [name], with whom he has a strained relationship.  As their mother is aging, their have been some small movements toward improving the relationship.  Both his mother and brother live in [place] .  Mental Status Exam: Appearance:   Casual and Fairly Groomed     Behavior:  Appropriate  Motor:  Normal  Speech/Language:   NA  Affect:  Appropriate  Mood:  anxious and irritable  Thought process:  normal  Thought content:    WNL  Sensory/Perceptual disturbances:    WNL  Orientation:  oriented to person, place, time/date, and situation  Attention:  Good  Concentration:  Good  Memory:  WNL  Fund of knowledge:   Good  Insight:    Good  Judgment:   Good  Impulse Control:  Good   Risk Assessment: Danger to Self:  No Self-injurious Behavior: No Danger to Others: No Duty to Warn:no Physical Aggression / Violence:No  Access to Firearms a concern: No   Substance Abuse History: Current substance abuse: No     Past Psychiatric History:   Previous psychological history is significant for anxiety and depression Outpatient Providers: current therapist History of Psych Hospitalization: No  Psychological Testing: n/a   Abuse History:  Victim of:  No., n/a   Report needed: No. Victim of Neglect:No. 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  If a parent, number of children / ages: Timothy [name] (25) they/them  Support Systems:  Spouse Friends Mother and Licensed conveyancer Stress:  Yes   Income/Employment/Disability: Employment  Financial planner: No   Educational History: Education: Risk manager: N/a  Any cultural differences that may affect / interfere with treatment:  not applicable   Recreation/Hobbies: D&D, movies  Stressors: Family Conflict, Aging parent  Barriers:  None   Legal History: Pending legal issue / charges: The patient has no significant history of legal issues. History of legal issue / charges: n/a  Medical History/Surgical History: reviewed Past Medical History:  Diagnosis Date   AKI (acute kidney injury) 12/22/2015   Anxiety    Diabetes mellitus (HCC)    Essential hypertension    started on lisinopril about 45 days prior to pancreatitis   Obesity    Pancreatitis, acute 12/22/2015   a. 11/2015 - acute severe pancreatitis with shock/sepsis, complicated by acute respiratory failure, transaminitis, anemia of critical illness, acute encephalopathy, AKI (Cr 1.36), protein-calorie malnutrition with severe hypoalbuminemia, & hypokalemia.   Splenic vein thrombosis     Past Surgical History:  Procedure Laterality Date   BACK SURGERY     ERCP N/A 01/31/2016   Procedure: ENDOSCOPIC RETROGRADE CHOLANGIOPANCREATOGRAPHY (ERCP);  Surgeon: Belvie Just, MD;  Location: [facility] ENDOSCOPY;  Service: Endoscopy;  Laterality: N/A;    Medications: Current Outpatient Medications  Medication Sig Dispense Refill   acetaminophen  (TYLENOL ) 325 MG tablet Take 650 mg by mouth every 6 (six) hours as needed for fever.     albuterol  (PROVENTIL  HFA;VENTOLIN  HFA) 108 (90 Base) MCG/ACT inhaler Inhale 1-2  puffs into the lungs every 6 (six) hours as needed for wheezing or shortness of breath. 1 Inhaler 0   blood glucose meter kit and supplies Dispense based on patient and insurance preference. Use up to four times daily as directed. (FOR ICD-9 250.00, 250.01). 1 each 0   carvedilol  (COREG ) 25 MG tablet Take 1 tablet (25 mg total) by mouth 2 (two) times daily. 180 tablet 3   cetirizine (ZYRTEC) 10 MG tablet Take 10 mg by mouth at bedtime.     fluticasone (FLONASE) 50 MCG/ACT nasal spray Place 1 spray into both nostrils at bedtime.     guaiFENesin -codeine  100-10 MG/5ML syrup Take 5 mLs by mouth at bedtime as needed for cough. 120 mL 0   HYDROmorphone  (DILAUDID ) 1 MG/ML injection Inject 1 mL (1 mg total) into the vein every 4 (four) hours as needed for severe pain. 1 mL 0   imipenem -cilastatin  500 mg in sodium chloride  0.9 % 100 mL Inject 500 mg into the vein every 6 (six) hours.     insulin  aspart (NOVOLOG ) 100 UNIT/ML FlexPen 8 units with meals (as long as eating 50%) PLUS the  following scale: CBG 70 - 120: 0 units CBG 121 - 150: 2 units CBG 151 - 200: 3 units CBG 201 - 250: 5 units CBG 251 - 300: 8 units CBG 301 - 350: 11 units CBG 351 - 400: 15 units (Patient taking differently: Inject 8 Units into the skin 3 (three) times daily with meals. 8 units with meals (as long as eating 50%) PLUS the following scale: CBG 70 - 120: 0 units CBG 121 - 150: 2 units CBG 151 - 200: 3 units CBG 201 - 250: 5 units CBG 251 - 300: 8 units CBG 301 - 350: 11 units CBG 351 - 400: 15 units) 60 mL 11   Insulin  Glargine (LANTUS  SOLOSTAR) 100 UNIT/ML Solostar Pen Inject 25 Units into the skin daily at 10 pm. 15 mL 11   Insulin  Pen Needle 31G X 5 MM MISC For insulin  injections 100 each 1   pantoprazole  (PROTONIX ) 40 MG tablet Take 1 tablet (40 mg total) by mouth at bedtime. 30 tablet 0   No current facility-administered medications for this visit.    No Known Allergies    Individualized Treatment  Plan                Strengths: verbal, bright, friendly  Supports: wife, daughter, parents   Goal/Needs for Treatment:  In order of importance to patient 1)  Resolve conflicted feelings, takes steps to change work situation and adapt to the new life circumstances.   2) Learn and implement coping skills that result in a reduction of anxiety and worry, and improved daily functioning.  3) Increase activities that reinforce a positive self-identity.  4) Learn and implement coping skills that result in a prevention of of relapse of depression and improve daily functioning.    Client Statement of Needs: work on pervasive issues with anxiety, assistance in making a big life change as he contemplates retiring from current position and figures out new direction   Treatment Level: Individual Biweekly Outpatient Psychotherapy  Symptoms: Autonomic hyperactivity (e.g., palpitations, shortness of breath, dry mouth, trouble swallowing, nausea, diarrhea).  Periodic periods of depressed or irritable mood.  Excessive and/or unrealistic worry that is difficult to control occurring more days than not for at least 6 months about a number of events or activities.  Feelings of hopelessness, worthlessness, or inappropriate guilt.  Hypervigilance (e.g., feeling constantly on edge, experiencing concentration difficulties, having trouble falling or staying asleep, exhibiting a general state of irritability).  Low self-esteem.  Behavioral passivity, difficulties initiating action Motor tension (e.g., restlessness, tiredness, shakiness, muscle tension).  Restlessness and feelings of lost identity and meaning due to retirement.   Client Treatment Preferences: continue with current therapist   Healthcare consumer's goal for treatment:  Psychologist, Ronal Jenkins Sprung, Ph.D. will support the patient's ability to achieve the goals identified. Cognitive Behavioral Therapy, Dialectical Behavioral Therapy, Motivational  Interviewing, Behavior Activation, parent skills, and other evidenced-based practices will be used to promote progress towards healthy functioning.   Healthcare consumer Lavonne Cass will: Actively participate in therapy, working towards healthy functioning.    *Justification for Continuation/Discontinuation of Goal: R=Revised, O=Ongoing, A=Achieved, D=Discontinued  Goal 1) Resolve conflicted feelings, takes steps to change work situation and adapt to the new life circumstances.   5 Point Likert rating baseline date: 02/22/2021 Target Date Goal Was reviewed Status Code Progress towards goal/Likert rating  03/20/2023 03/07/2022          O 2/5 - pt has taken initial steps but at times falls  into passivity and has made little progress  03/19/2024 03/20/2023          O 4/5 - pt has taken steps to move forward with early retirement, but now is challenged w/ a denial & the p/ begins again   03/04/2024          D 5/5 - pt f/t and has taken his early retirement and has actively concluded his leave   Goal 2) Learn and implement coping skills that result in a reduction of anxiety and worry, and improved daily functioning.  5 Point Likert rating baseline date: 02/23/2015 Target Date Goal Was reviewed Status Code Progress towards goal/Likert rating  03/20/2023 03/07/2022          O 3/5 - pt has learned coping skills and uses them appropriately but inconsistently  03/19/2024 03/20/2023          O 3.5 /5 - pt is better at identifying when he is anxious & more consistently uses his skills  03/04/2025 03/04/2024          O 4/5/- pt continues to be able to identify his anxiety and more quick is able to intervene to manage his anxiety   Goal 3) Increase activities that reinforce a positive self-identity.   5 Point Likert rating baseline date: 02/23/2015 Target Date Goal Was reviewed Status Code Progress towards goal/Likert rating  03/20/2023 03/07/2022          O 2/5 - pt has taken initial steps to introduce new  activities that have improved his feelings of positive self esteem  03/19/2024 03/20/2023          O 2.5/5 - pt has not taken any new steps but has recognized the need to do more & is making plans  03/04/2025 03/04/2024          O 3/5 - pt has improved his planning and is participating in more activities   Goal 4) Learn and implement coping skills that result in a prevention of of relapse of depression and improve  daily functioning.   5  Point Likert rating baseline date: 02/23/2015 Target Date Goal Was reviewed Status Code Progress towards goal/Likert rating   03/20/2023  03/07/2022            O  4/5 - pt has learned coping skills and uses them appropriately but inconsistently   03/19/2024  03/20/2023            O  3/5 - pt has regressed & has slipped back into old patterns which has effected his mood & fnxg   03/04/2025  03/04/2024            O  4/5 - pt has taken steps to more actively participate in family life and made changes in his work life that have made him less stressed   Goal 5) Resolve conflicted feelings, takes steps to change work situation and adapt to the new life circumstances.   5 Point Likert rating baseline date: 03/04/2024 Target Date Goal Was reviewed Status Code Progress towards goal/Likert rating  03/04/2025 03/04/2024          N                                  This plan has been reviewed and created by the following participants:  This plan will be reviewed at least every 12 months. Date Behavioral Health Clinician Date  Guardian/Patient   03/07/2022  Ronal Jenkins Sprung, Ph.D.   03/07/2022 Timothy [name]  03/20/2023  Ronal Jenkins Sprung, Ph.D.  03/20/2023 Timothy [name]  03/04/2024 Ronal Jenkins Sprung, Ph.D.  03/04/2024 Timothy [name]          Diagnoses:  Generalized Anxiety Disorder Phase of Life Change - Adult   Rami reports that two days after we spoke, he got a call offering him the job he wanted.  We d/e/p what occurred, his anxious thoughts, and feelings of relief.  We  d/p how the tone in the house has been different, how he is less reactive and finding it easier to accept what comes his way.  Lastly, we d/e/p preparing for the yet another adjustment and ways to  ensure a smoother transition.  I provided the support and guidance needed by patient, and encouraged him to continue to schedule a weekly family meeting.    Home Practice:  create a new work routine  Ronal Jenkins Sprung, PhD

## 2024-05-13 ENCOUNTER — Ambulatory Visit: Admitting: Psychology

## 2024-05-13 DIAGNOSIS — Z600 Problems of adjustment to life-cycle transitions: Secondary | ICD-10-CM | POA: Diagnosis not present

## 2024-05-13 DIAGNOSIS — F411 Generalized anxiety disorder: Secondary | ICD-10-CM | POA: Diagnosis not present

## 2024-05-13 NOTE — Progress Notes (Signed)
 PROGRESS NOTE:  Name: Timothy [name] Date: 05/13/2024 MRN: [id number] DOB: 1976/07/21 PCP: Loretha Richerd SAUNDERS, MD  Time spent: 8:00 AM - 8:58 AM  Annual Review: 03/04/2025   Today I met with  [name] in remote video ([facility]) face-to-face individual psychotherapy.  Distance Site: Client's Home Orginating Site: Dr Edison Remote Office Consent: Obtained verbal consent to transmit session remotely.   Patient is aware of the limitations related to conducting virtual therapy.    Presenting Problem: Izsak Meir is a 48 y.o. WMM who started therapy several years ago seeking treatment for depression and anxiety.  Since the start of treatment, depression has resolved with periodic mild reoccurrences.  Anxiety however, has proven to be chronic and effects all areas of his life.  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  Unfortunately, he application for early retirement was denied and has created a new series of questions about his future work path.  Therefore, we have continued the process of discerning next steps in his career and professional development.  Timothy [name]'s mother is aging and he is beginning to see the signs of the need for additional support and negotiating way to provide the needed support and guidance.  Background History: Timothy [name] is married to [name] (53) and they have been married for 18 years.   Recently, [name] decided to go back to college to complete an undergraduate degree in education.  She already has her JD, but does not wish to return to practicing law.  While she has been happy with her decision to get a degree in education, it was a stressful time for the family, and [name] had learned to step up and help out more around the hourse.  Together they have one daughter, Timothy [name] (44) who is a rising sophomore at [employer], they are a talented musician and play the saxophone, as well as  other instruments in their high school band and jazz ensemble.  A few years ago, his father-in-law purchased a house that they could all live in together.  Timothy [name] is still adjusting to having his father-in-law live under the  same roof.  [name] only has one sibling, an adopted [name], with whom he has a strained relationship.  As their mother is aging, their have been some small movements toward improving the relationship.  Both his mother and brother live in [place] .  Mental Status Exam: Appearance:   Casual and Fairly Groomed     Behavior:  Appropriate  Motor:  Normal  Speech/Language:   NA  Affect:  Appropriate  Mood:  anxious and irritable  Thought process:  normal  Thought content:    WNL  Sensory/Perceptual disturbances:    WNL  Orientation:  oriented to person, place, time/date, and situation  Attention:  Good  Concentration:  Good  Memory:  WNL  Fund of knowledge:   Good  Insight:    Good  Judgment:   Good  Impulse Control:  Good   Risk Assessment: Danger to Self:  No Self-injurious Behavior: No Danger to Others: No Duty to Warn:no Physical Aggression / Violence:No  Access to Firearms a concern: No   Substance Abuse History: Current substance abuse: No     Past Psychiatric History:   Previous psychological history is significant for anxiety and depression Outpatient Providers: current therapist History of Psych Hospitalization: No  Psychological Testing: n/a   Abuse History:  Victim of:  No., n/a   Report needed: No. Victim of Neglect:No. 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  If a parent, number of children / ages: Timothy [name] (44) they/them  Support Systems:  Spouse Friends Mother and Licensed conveyancer Stress:  Yes   Income/Employment/Disability: Employment  Financial planner: No   Educational History: Education: Risk manager: N/a  Any cultural differences that may affect / interfere with treatment:  not applicable   Recreation/Hobbies: D&D, movies  Stressors: Family Conflict, Aging parent  Barriers:  None   Legal History: Pending legal issue / charges: The patient has no significant history of legal issues. History of legal issue / charges: n/a  Medical History/Surgical History: reviewed Past Medical History:  Diagnosis Date   AKI (acute kidney injury) 12/22/2015   Anxiety    Diabetes mellitus (HCC)    Essential hypertension    started on lisinopril about 45 days prior to pancreatitis   Obesity    Pancreatitis, acute 12/22/2015   a. 11/2015 - acute severe pancreatitis with shock/sepsis, complicated by acute respiratory failure, transaminitis, anemia of critical illness, acute encephalopathy, AKI (Cr 1.36), protein-calorie malnutrition with severe hypoalbuminemia, & hypokalemia.   Splenic vein thrombosis     Past Surgical History:  Procedure Laterality Date   BACK SURGERY     ERCP N/A 01/31/2016   Procedure: ENDOSCOPIC RETROGRADE CHOLANGIOPANCREATOGRAPHY (ERCP);  Surgeon: Belvie Just, MD;  Location: [facility]COPY;  Service: Endoscopy;  Laterality: N/A;    Medications: Current Outpatient Medications  Medication Sig Dispense Refill   acetaminophen  (TYLENOL ) 325 MG tablet Take 650 mg by mouth every 6 (six) hours as needed for fever.     albuterol  (PROVENTIL  HFA;VENTOLIN  HFA) 108 (90 Base) MCG/ACT inhaler Inhale 1-2  puffs into the lungs every 6 (six) hours as needed for wheezing or shortness of breath. 1 Inhaler 0   blood glucose meter kit and supplies Dispense based on patient and insurance preference. Use up to four times daily as directed. (FOR ICD-9 250.00, 250.01). 1 each 0   carvedilol  (COREG ) 25 MG tablet Take 1 tablet (25 mg total) by mouth 2 (two) times daily. 180 tablet 3   cetirizine (ZYRTEC) 10 MG tablet Take 10 mg by mouth at bedtime.     fluticasone (FLONASE) 50 MCG/ACT nasal spray Place 1 spray into both nostrils at bedtime.     guaiFENesin -codeine  100-10 MG/5ML syrup Take 5 mLs by mouth at bedtime as needed for cough. 120 mL 0   HYDROmorphone  (DILAUDID ) 1 MG/ML injection Inject 1 mL (1 mg total) into the vein every 4 (four) hours as needed for severe pain. 1 mL 0   imipenem -cilastatin  500 mg in sodium chloride  0.9 % 100 mL Inject 500 mg into the vein every 6 (six) hours.     insulin  aspart (NOVOLOG ) 100 UNIT/ML FlexPen 8 units with meals (as long as eating 50%) PLUS the  following scale: CBG 70 - 120: 0 units CBG 121 - 150: 2 units CBG 151 - 200: 3 units CBG 201 - 250: 5 units CBG 251 - 300: 8 units CBG 301 - 350: 11 units CBG 351 - 400: 15 units (Patient taking differently: Inject 8 Units into the skin 3 (three) times daily with meals. 8 units with meals (as long as eating 50%) PLUS the following scale: CBG 70 - 120: 0 units CBG 121 - 150: 2 units CBG 151 - 200: 3 units CBG 201 - 250: 5 units CBG 251 - 300: 8 units CBG 301 - 350: 11 units CBG 351 - 400: 15 units) 60 mL 11   Insulin  Glargine (LANTUS  SOLOSTAR) 100 UNIT/ML Solostar Pen Inject 25 Units into the skin daily at 10 pm. 15 mL 11   Insulin  Pen Needle 31G X 5 MM MISC For insulin  injections 100 each 1   pantoprazole  (PROTONIX ) 40 MG tablet Take 1 tablet (40 mg total) by mouth at bedtime. 30 tablet 0   No current facility-administered medications for this visit.    No Known Allergies    Individualized Treatment  Plan                Strengths: verbal, bright, friendly  Supports: wife, daughter, parents   Goal/Needs for Treatment:  In order of importance to patient 1)  Resolve conflicted feelings, takes steps to change work situation and adapt to the new life circumstances.   2) Learn and implement coping skills that result in a reduction of anxiety and worry, and improved daily functioning.  3) Increase activities that reinforce a positive self-identity.  4) Learn and implement coping skills that result in a prevention of of relapse of depression and improve daily functioning.    Client Statement of Needs: work on pervasive issues with anxiety, assistance in making a big life change as he contemplates retiring from current position and figures out new direction   Treatment Level: Individual Biweekly Outpatient Psychotherapy  Symptoms: Autonomic hyperactivity (e.g., palpitations, shortness of breath, dry mouth, trouble swallowing, nausea, diarrhea).  Periodic periods of depressed or irritable mood.  Excessive and/or unrealistic worry that is difficult to control occurring more days than not for at least 6 months about a number of events or activities.  Feelings of hopelessness, worthlessness, or inappropriate guilt.  Hypervigilance (e.g., feeling constantly on edge, experiencing concentration difficulties, having trouble falling or staying asleep, exhibiting a general state of irritability).  Low self-esteem.  Behavioral passivity, difficulties initiating action Motor tension (e.g., restlessness, tiredness, shakiness, muscle tension).  Restlessness and feelings of lost identity and meaning due to retirement.   Client Treatment Preferences: continue with current therapist   Healthcare consumer's goal for treatment:  Psychologist, Ronal Jenkins Sprung, Ph.D. will support the patient's ability to achieve the goals identified. Cognitive Behavioral Therapy, Dialectical Behavioral Therapy, Motivational  Interviewing, Behavior Activation, parent skills, and other evidenced-based practices will be used to promote progress towards healthy functioning.   Healthcare consumer Clive Parcel will: Actively participate in therapy, working towards healthy functioning.    *Justification for Continuation/Discontinuation of Goal: R=Revised, O=Ongoing, A=Achieved, D=Discontinued  Goal 1) Resolve conflicted feelings, takes steps to change work situation and adapt to the new life circumstances.   5 Point Likert rating baseline date: 02/22/2021 Target Date Goal Was reviewed Status Code Progress towards goal/Likert rating  03/20/2023 03/07/2022          O 2/5 - pt has taken initial steps but at times falls  into passivity and has made little progress  03/19/2024 03/20/2023          O 4/5 - pt has taken steps to move forward with early retirement, but now is challenged w/ a denial & the p/ begins again   03/04/2024          D 5/5 - pt f/t and has taken his early retirement and has actively concluded his leave   Goal 2) Learn and implement coping skills that result in a reduction of anxiety and worry, and improved daily functioning.  5 Point Likert rating baseline date: 02/23/2015 Target Date Goal Was reviewed Status Code Progress towards goal/Likert rating  03/20/2023 03/07/2022          O 3/5 - pt has learned coping skills and uses them appropriately but inconsistently  03/19/2024 03/20/2023          O 3.5 /5 - pt is better at identifying when he is anxious & more consistently uses his skills  03/04/2025 03/04/2024          O 4/5/- pt continues to be able to identify his anxiety and more quick is able to intervene to manage his anxiety   Goal 3) Increase activities that reinforce a positive self-identity.   5 Point Likert rating baseline date: 02/23/2015 Target Date Goal Was reviewed Status Code Progress towards goal/Likert rating  03/20/2023 03/07/2022          O 2/5 - pt has taken initial steps to introduce new  activities that have improved his feelings of positive self esteem  03/19/2024 03/20/2023          O 2.5/5 - pt has not taken any new steps but has recognized the need to do more & is making plans  03/04/2025 03/04/2024          O 3/5 - pt has improved his planning and is participating in more activities   Goal 4) Learn and implement coping skills that result in a prevention of of relapse of depression and improve  daily functioning.   5  Point Likert rating baseline date: 02/23/2015 Target Date Goal Was reviewed Status Code Progress towards goal/Likert rating   03/20/2023  03/07/2022            O  4/5 - pt has learned coping skills and uses them appropriately but inconsistently   03/19/2024  03/20/2023            O  3/5 - pt has regressed & has slipped back into old patterns which has effected his mood & fnxg   03/04/2025  03/04/2024            O  4/5 - pt has taken steps to more actively participate in family life and made changes in his work life that have made him less stressed   Goal 5) Resolve conflicted feelings, takes steps to change work situation and adapt to the new life circumstances.   5 Point Likert rating baseline date: 03/04/2024 Target Date Goal Was reviewed Status Code Progress towards goal/Likert rating  03/04/2025 03/04/2024          N                                  This plan has been reviewed and created by the following participants:  This plan will be reviewed at least every 12 months. Date Behavioral Health Clinician Date  Guardian/Patient   03/07/2022  Ronal Jenkins Sprung, Ph.D.   03/07/2022 [name]  03/20/2023  Ronal Jenkins Sprung, Ph.D.  03/20/2023 [name]  03/04/2024 Ronal Jenkins Sprung, Ph.D.  03/04/2024 [name]          Diagnoses:  Generalized Anxiety Disorder Phase of Life Change - Adult   Kiko reports that he completed his first week of work.  We d/e/p how his training is going, feeling challenged in a positive way, and feeling excited about work for  the first time in a very long time.  We d/ the ways he feels this is a good place to work for him and all he has to offer from his work experiences.  I provided the support and guidance needed by patient, and encouraged him to continue to approach new challenges at his new job from a positive perspective.  Home Practice:  create a new work routine  Ronal Jenkins Sprung, PhD

## 2024-05-27 ENCOUNTER — Ambulatory Visit (INDEPENDENT_AMBULATORY_CARE_PROVIDER_SITE_OTHER): Admitting: Psychology

## 2024-05-27 DIAGNOSIS — F411 Generalized anxiety disorder: Secondary | ICD-10-CM | POA: Diagnosis not present

## 2024-05-27 DIAGNOSIS — Z600 Problems of adjustment to life-cycle transitions: Secondary | ICD-10-CM

## 2024-05-27 NOTE — Progress Notes (Signed)
 PROGRESS NOTE:  Name: Timothy [name] Date: 05/27/2024 MRN: [id number] DOB: 22-Aug-1975 PCP: Loretha Richerd SAUNDERS, MD  Time spent: 8:00 AM - 8:58 AM  Annual Review: 03/04/2025   Today I met with  Timothy [name] in remote video (Careg[facility]) face-to-face individual psychotherapy.  Distance Site: Client's Home Orginating Site: Dr Edison Remote Office Consent: Obtained verbal consent to transmit session remotely.   Patient is aware of the limitations related to conducting virtual therapy.    Presenting Problem: Timothy [name] is a 48 y.o. WMM who started therapy several years ago seeking treatment for depression and anxiety.  Since the start of treatment, depression has resolved with periodic mild reoccurrences.  Anxiety however, has proven to be chronic and effects all areas of his life.  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  Unfortunately, he application for early retirement was denied and has created a new series of questions about his future work path.  Therefore, we have continued the process of discerning next steps in his career and professional development.  Timothy [name] is aging and he is beginning to see the signs of the need for additional support and negotiating way to provide the needed support and guidance.  Background History: Timothy [name] is married to [name] (53) and they have been married for 18 years.   Recently, Timothy [name] decided to go back to college to complete an undergraduate degree in education.  She already has her JD, but does not wish to return to practicing law.  While she has been happy with her decision to get a degree in education, it was a stressful time for the family, and Timothy [name] had learned to step up and help out more around the hourse.  Together they have one daughter, Timothy [name] (73) who is a rising sophomore at [employer], they are a talented musician and play the saxophone, as well as  other instruments in their high school band and jazz ensemble.  A few years ago, his father-in-law purchased a house that they could all live in together.  Timothy [name] is still adjusting to having his father-in-law live under the  same roof.  Timothy [name] only has one sibling, an adopted [name], with whom he has a strained relationship.  As their [name] is aging, their have been some small movements toward improving the relationship.  Both his [name] and brother live in [place] .  Mental Status Exam: Appearance:   Casual and Fairly Groomed     Behavior:  Appropriate  Motor:  Normal  Speech/Language:   NA  Affect:  Appropriate  Mood:  anxious and irritable  Thought process:  normal  Thought content:    WNL  Sensory/Perceptual disturbances:    WNL  Orientation:  oriented to person, place, time/date, and situation  Attention:  Good  Concentration:  Good  Memory:  WNL  Fund of knowledge:   Good  Insight:    Good  Judgment:   Good  Impulse Control:  Good   Risk Assessment: Danger to Self:  No Self-injurious Behavior: No Danger to Others: No Duty to Warn:no Physical Aggression / Violence:No  Access to Firearms a concern: No   Substance Abuse History: Current substance abuse: No     Past Psychiatric History:   Previous psychological history is significant for anxiety and depression Outpatient Providers: current therapist History of Psych Hospitalization: No  Psychological Testing: n/a   Abuse History:  Victim of:  No., n/a   Report needed: No. Victim of Neglect:No. 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  If a parent, number of children / ages: Timothy [name] (83) they/them  Support Systems:  Spouse [name] and Licensed Conveyancer Stress:  Yes   Income/Employment/Disability: Employment  Financial Planner: No   Educational History: Education: Risk Manager: N/a  Any cultural differences that may affect / interfere with treatment:  not applicable   Recreation/Hobbies: D&D, movies  Stressors: Family Conflict, Aging parent  Barriers:  None   Legal History: Pending legal issue / charges: The patient has no significant history of legal issues. History of legal issue / charges: n/a  Medical History/Surgical History: reviewed Past Medical History:  Diagnosis Date   AKI (acute kidney injury) 12/22/2015   Anxiety    Diabetes mellitus (HCC)    Essential hypertension    started on lisinopril about 45 days prior to pancreatitis   Obesity    Pancreatitis, acute 12/22/2015   a. 11/2015 - acute severe pancreatitis with shock/sepsis, complicated by acute respiratory failure, transaminitis, anemia of critical illness, acute encephalopathy, AKI (Cr 1.36), protein-calorie malnutrition with severe hypoalbuminemia, & hypokalemia.   Splenic vein thrombosis     Past Surgical History:  Procedure Laterality Date   BACK SURGERY     ERCP N/A 01/31/2016   Procedure: ENDOSCOPIC RETROGRADE CHOLANGIOPANCREATOGRAPHY (ERCP);  Surgeon: Belvie Just, MD;  Location: [facility] ENDOSCOPY;  Service: Endoscopy;  Laterality: N/A;    Medications: Current Outpatient Medications  Medication Sig Dispense Refill   acetaminophen  (TYLENOL ) 325 MG tablet Take 650 mg by mouth every 6 (six) hours as needed for fever.     albuterol  (PROVENTIL  HFA;VENTOLIN  HFA) 108 (90 Base) MCG/ACT inhaler Inhale 1-2  puffs into the lungs every 6 (six) hours as needed for wheezing or shortness of breath. 1 Inhaler 0   blood glucose meter kit and supplies Dispense based on patient and insurance preference. Use up to four times daily as directed. (FOR ICD-9 250.00, 250.01). 1 each 0   carvedilol  (COREG ) 25 MG tablet Take 1 tablet (25 mg total) by mouth 2 (two) times daily. 180 tablet 3   cetirizine (ZYRTEC) 10 MG tablet Take 10 mg by mouth at bedtime.     fluticasone (FLONASE) 50 MCG/ACT nasal spray Place 1 spray into both nostrils at bedtime.     guaiFENesin -codeine  100-10 MG/5ML syrup Take 5 mLs by mouth at bedtime as needed for cough. 120 mL 0   HYDROmorphone  (DILAUDID ) 1 MG/ML injection Inject 1 mL (1 mg total) into the vein every 4 (four) hours as needed for severe pain. 1 mL 0   imipenem -cilastatin  500 mg in sodium chloride  0.9 % 100 mL Inject 500 mg into the vein every 6 (six) hours.     insulin  aspart (NOVOLOG ) 100 UNIT/ML FlexPen 8 units with meals (as long as eating 50%) PLUS the  following scale: CBG 70 - 120: 0 units CBG 121 - 150: 2 units CBG 151 - 200: 3 units CBG 201 - 250: 5 units CBG 251 - 300: 8 units CBG 301 - 350: 11 units CBG 351 - 400: 15 units (Patient taking differently: Inject 8 Units into the skin 3 (three) times daily with meals. 8 units with meals (as long as eating 50%) PLUS the following scale: CBG 70 - 120: 0 units CBG 121 - 150: 2 units CBG 151 - 200: 3 units CBG 201 - 250: 5 units CBG 251 - 300: 8 units CBG 301 - 350: 11 units CBG 351 - 400: 15 units) 60 mL 11   Insulin  Glargine (LANTUS  SOLOSTAR) 100 UNIT/ML Solostar Pen Inject 25 Units into the skin daily at 10 pm. 15 mL 11   Insulin  Pen Needle 31G X 5 MM MISC For insulin  injections 100 each 1   pantoprazole  (PROTONIX ) 40 MG tablet Take 1 tablet (40 mg total) by mouth at bedtime. 30 tablet 0   No current facility-administered medications for this visit.    No Known Allergies    Individualized Treatment  Plan                Strengths: verbal, bright, friendly  Supports: wife, daughter, parents   Goal/Needs for Treatment:  In order of importance to patient 1)  Resolve conflicted feelings, takes steps to change work situation and adapt to the new life circumstances.   2) Learn and implement coping skills that result in a reduction of anxiety and worry, and improved daily functioning.  3) Increase activities that reinforce a positive self-identity.  4) Learn and implement coping skills that result in a prevention of of relapse of depression and improve daily functioning.    Client Statement of Needs: work on pervasive issues with anxiety, assistance in making a big life change as he contemplates retiring from current position and figures out new direction   Treatment Level: Individual Biweekly Outpatient Psychotherapy  Symptoms: Autonomic hyperactivity (e.g., palpitations, shortness of breath, dry mouth, trouble swallowing, nausea, diarrhea).  Periodic periods of depressed or irritable mood.  Excessive and/or unrealistic worry that is difficult to control occurring more days than not for at least 6 months about a number of events or activities.  Feelings of hopelessness, worthlessness, or inappropriate guilt.  Hypervigilance (e.g., feeling constantly on edge, experiencing concentration difficulties, having trouble falling or staying asleep, exhibiting a general state of irritability).  Low self-esteem.  Behavioral passivity, difficulties initiating action Motor tension (e.g., restlessness, tiredness, shakiness, muscle tension).  Restlessness and feelings of lost identity and meaning due to retirement.   Client Treatment Preferences: continue with current therapist   Healthcare consumer's goal for treatment:  Psychologist, Timothy [name], Ph.D. will support the patient's ability to achieve the goals identified. Cognitive Behavioral Therapy, Dialectical Behavioral Therapy, Motivational  Interviewing, Behavior Activation, parent skills, and other evidenced-based practices will be used to promote progress towards healthy functioning.   Healthcare consumer Timothy [name] will: Actively participate in therapy, working towards healthy functioning.    *Justification for Continuation/Discontinuation of Goal: R=Revised, O=Ongoing, A=Achieved, D=Discontinued  Goal 1) Resolve conflicted feelings, takes steps to change work situation and adapt to the new life circumstances.   5 Point Likert rating baseline date: 02/22/2021 Target Date Goal Was reviewed Status Code Progress towards goal/Likert rating  03/20/2023 03/07/2022          O 2/5 - pt has taken initial steps but at times falls  into passivity and has made little progress  03/19/2024 03/20/2023          O 4/5 - pt has taken steps to move forward with early retirement, but now is challenged w/ a denial & the p/ begins again   03/04/2024          D 5/5 - pt f/t and has taken his early retirement and has actively concluded his leave   Goal 2) Learn and implement coping skills that result in a reduction of anxiety and worry, and improved daily functioning.  5 Point Likert rating baseline date: 02/23/2015 Target Date Goal Was reviewed Status Code Progress towards goal/Likert rating  03/20/2023 03/07/2022          O 3/5 - pt has learned coping skills and uses them appropriately but inconsistently  03/19/2024 03/20/2023          O 3.5 /5 - pt is better at identifying when he is anxious & more consistently uses his skills  03/04/2025 03/04/2024          O 4/5/- pt continues to be able to identify his anxiety and more quick is able to intervene to manage his anxiety   Goal 3) Increase activities that reinforce a positive self-identity.   5 Point Likert rating baseline date: 02/23/2015 Target Date Goal Was reviewed Status Code Progress towards goal/Likert rating  03/20/2023 03/07/2022          O 2/5 - pt has taken initial steps to introduce new  activities that have improved his feelings of positive self esteem  03/19/2024 03/20/2023          O 2.5/5 - pt has not taken any new steps but has recognized the need to do more & is making plans  03/04/2025 03/04/2024          O 3/5 - pt has improved his planning and is participating in more activities   Goal 4) Learn and implement coping skills that result in a prevention of of relapse of depression and improve  daily functioning.   5  Point Likert rating baseline date: 02/23/2015 Target Date Goal Was reviewed Status Code Progress towards goal/Likert rating   03/20/2023  03/07/2022            O  4/5 - pt has learned coping skills and uses them appropriately but inconsistently   03/19/2024  03/20/2023            O  3/5 - pt has regressed & has slipped back into old patterns which has effected his mood & fnxg   03/04/2025  03/04/2024            O  4/5 - pt has taken steps to more actively participate in family life and made changes in his work life that have made him less stressed   Goal 5) Resolve conflicted feelings, takes steps to change work situation and adapt to the new life circumstances.   5 Point Likert rating baseline date: 03/04/2024 Target Date Goal Was reviewed Status Code Progress towards goal/Likert rating  03/04/2025 03/04/2024          N                                  This plan has been reviewed and created by the following participants:  This plan will be reviewed at least every 12 months. Date Behavioral Health Clinician Date  Guardian/Patient   03/07/2022  Timothy [name], Ph.D.   03/07/2022 Timothy [name]  03/20/2023  Timothy [name], Ph.D.  03/20/2023 Timothy [name]  03/04/2024 Timothy [name], Ph.D.  03/04/2024 Timothy [name]          Diagnoses:  Generalized Anxiety Disorder Phase of Life Change - Adult   Timothy [name] reports that he is still in training at work.  We d/e/p how his training is going, the way his new work environment is already different in a positive way, and  normalizing bad situations in order to be able to tolerate the circumstances.  Last, we d/p his wife's health issues, helping her manage her anxiety while managing his own, and how to provide the support needed.  feeling appreciated.    Home Practice:  create a new work routine  Timothy Jenkins Sprung, PhD

## 2024-06-24 ENCOUNTER — Ambulatory Visit (INDEPENDENT_AMBULATORY_CARE_PROVIDER_SITE_OTHER): Admitting: Psychology

## 2024-06-24 DIAGNOSIS — F411 Generalized anxiety disorder: Secondary | ICD-10-CM | POA: Diagnosis not present

## 2024-06-24 NOTE — Progress Notes (Signed)
 PROGRESS NOTE:  Name: Timothy [name] Date: 06/24/2024 MRN: [id number] DOB: Jun 30, 1976 PCP: Timothy Richerd SAUNDERS, MD  Time spent: 8:00 AM - 8:58 AM  Annual Review: 03/04/2025   Today I met with  Timothy [name] in remote video (Careg[facility]) face-to-face individual psychotherapy.  Distance Site: Client's Home Orginating Site: Dr Timothy [name] Remote Office Consent: Obtained verbal consent to transmit session remotely.   Patient is aware of the limitations related to conducting virtual therapy.    Presenting Problem: Timothy [name] is a 48 y.o. WMM who started therapy several years ago seeking treatment for depression and anxiety.  Since the start of treatment, depression has resolved with periodic mild reoccurrences.  Anxiety however, has proven to be chronic and effects all areas of his life.  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  Unfortunately, he application for early retirement was denied and has created a new series of questions about his future work path.  Therefore, we have continued the process of discerning next steps in his career and professional development.  Timothy [name] mother is aging and he is beginning to see the signs of the need for additional support and negotiating way to provide the needed support and guidance.  Background History: Timothy [name] is married to Timothy [name] (53) and they have been married for 18 years.   Recently, Timothy [name] decided to go back to college to complete an undergraduate degree in education.  She already has her JD, but does not wish to return to practicing law.  While she has been happy with her decision to get a degree in education, it was a stressful time for the family, and Timothy [name] had learned to step up and help out more around the hourse.  Together they have one daughter, Timothy [name] (25) who is a rising sophomore at [name], they are a talented musician and play the saxophone, as well as  other instruments in their high school band and jazz ensemble.  A few years ago, his father-in-law purchased a house that they could all live in together.  Timothy [name] is still adjusting to having his father-in-law live under the  same roof.  Timothy [name] only has one sibling, an adopted [name], with whom he has a strained relationship.  As their mother is aging, their have been some small movements toward improving the relationship.  Both his mother and brother live in [place] .  Mental Status Exam: Appearance:   Casual and Fairly Groomed     Behavior:  Appropriate  Motor:  Normal  Speech/Language:   NA  Affect:  Appropriate  Mood:  anxious and irritable  Thought process:  normal  Thought content:    WNL  Sensory/Perceptual disturbances:    WNL  Orientation:  oriented to person, place, time/date, and situation  Attention:  Good  Concentration:  Good  Memory:  WNL  Fund of knowledge:   Good  Insight:    Good  Judgment:   Good  Impulse Control:  Good   Risk Assessment: Danger to Self:  No Self-injurious Behavior: No Danger to Others: No Duty to Warn:no Physical Aggression / Violence:No  Access to Firearms a concern: No   Substance Abuse History: Current substance abuse: No     Past Psychiatric History:   Previous psychological history is significant for anxiety and depression Outpatient Providers: current therapist History of Psych Hospitalization: No  Psychological Testing: n/a   Abuse History:  Victim of:  No., n/a   Report needed: No. Victim of Neglect:No. 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  If a parent, number of children / ages: Timothy [name] (23) they/them  Support Systems:  Spouse Friends Mother and Licensed Conveyancer Stress:  Yes   Income/Employment/Disability: Employment  Financial Planner: No   Educational History: Education: Risk Manager: N/a  Any cultural differences that may affect / interfere with treatment:  not applicable   Recreation/Hobbies: D&D, movies  Stressors: Family Conflict, Aging parent  Barriers:  None   Legal History: Pending legal issue / charges: The patient has no significant history of legal issues. History of legal issue / charges: n/a  Medical History/Surgical History: reviewed Past Medical History:  Diagnosis Date   AKI (acute kidney injury) 12/22/2015   Anxiety    Diabetes mellitus (HCC)    Essential hypertension    started on lisinopril about 45 days prior to pancreatitis   Obesity    Pancreatitis, acute 12/22/2015   a. 11/2015 - acute severe pancreatitis with shock/sepsis, complicated by acute respiratory failure, transaminitis, anemia of critical illness, acute encephalopathy, AKI (Cr 1.36), protein-calorie malnutrition with severe hypoalbuminemia, & hypokalemia.   Splenic vein thrombosis     Past Surgical History:  Procedure Laterality Date   BACK SURGERY     ERCP N/A 01/31/2016   Procedure: ENDOSCOPIC RETROGRADE CHOLANGIOPANCREATOGRAPHY (ERCP);  Surgeon: Timothy Just, MD;  Location: [facility] ENDOSCOPY;  Service: Endoscopy;  Laterality: N/A;    Medications: Current Outpatient Medications  Medication Sig Dispense Refill   acetaminophen  (TYLENOL ) 325 MG tablet Take 650 mg by mouth every 6 (six) hours as needed for fever.     albuterol  (PROVENTIL  HFA;VENTOLIN  HFA) 108 (90 Base) MCG/ACT inhaler Inhale 1-2  puffs into the lungs every 6 (six) hours as needed for wheezing or shortness of breath. 1 Inhaler 0   blood glucose meter kit and supplies Dispense based on patient and insurance preference. Use up to four times daily as directed. (FOR ICD-9 250.00, 250.01). 1 each 0   carvedilol  (COREG ) 25 MG tablet Take 1 tablet (25 mg total) by mouth 2 (two) times daily. 180 tablet 3   cetirizine (ZYRTEC) 10 MG tablet Take 10 mg by mouth at bedtime.     fluticasone (FLONASE) 50 MCG/ACT nasal spray Place 1 spray into both nostrils at bedtime.     guaiFENesin -codeine  100-10 MG/5ML syrup Take 5 mLs by mouth at bedtime as needed for cough. 120 mL 0   HYDROmorphone  (DILAUDID ) 1 MG/ML injection Inject 1 mL (1 mg total) into the vein every 4 (four) hours as needed for severe pain. 1 mL 0   imipenem -cilastatin  500 mg in sodium chloride  0.9 % 100 mL Inject 500 mg into the vein every 6 (six) hours.     insulin  aspart (NOVOLOG ) 100 UNIT/ML FlexPen 8 units with meals (as long as eating 50%) PLUS the  following scale: CBG 70 - 120: 0 units CBG 121 - 150: 2 units CBG 151 - 200: 3 units CBG 201 - 250: 5 units CBG 251 - 300: 8 units CBG 301 - 350: 11 units CBG 351 - 400: 15 units (Patient taking differently: Inject 8 Units into the skin 3 (three) times daily with meals. 8 units with meals (as long as eating 50%) PLUS the following scale: CBG 70 - 120: 0 units CBG 121 - 150: 2 units CBG 151 - 200: 3 units CBG 201 - 250: 5 units CBG 251 - 300: 8 units CBG 301 - 350: 11 units CBG 351 - 400: 15 units) 60 mL 11   Insulin  Glargine (LANTUS  SOLOSTAR) 100 UNIT/ML Solostar Pen Inject 25 Units into the skin daily at 10 pm. 15 mL 11   Insulin  Pen Needle 31G X 5 MM MISC For insulin  injections 100 each 1   pantoprazole  (PROTONIX ) 40 MG tablet Take 1 tablet (40 mg total) by mouth at bedtime. 30 tablet 0   No current facility-administered medications for this visit.    No Known Allergies    Individualized Treatment  Plan                Strengths: verbal, bright, friendly  Supports: wife, daughter, parents   Goal/Needs for Treatment:  In order of importance to patient 1)  Resolve conflicted feelings, takes steps to change work situation and adapt to the new life circumstances.   2) Learn and implement coping skills that result in a reduction of anxiety and worry, and improved daily functioning.  3) Increase activities that reinforce a positive self-identity.  4) Learn and implement coping skills that result in a prevention of of relapse of depression and improve daily functioning.    Client Statement of Needs: work on pervasive issues with anxiety, assistance in making a big life change as he contemplates retiring from current position and figures out new direction   Treatment Level: Individual Biweekly Outpatient Psychotherapy  Symptoms: Autonomic hyperactivity (e.g., palpitations, shortness of breath, dry mouth, trouble swallowing, nausea, diarrhea).  Periodic periods of depressed or irritable mood.  Excessive and/or unrealistic worry that is difficult to control occurring more days than not for at least 6 months about a number of events or activities.  Feelings of hopelessness, worthlessness, or inappropriate guilt.  Hypervigilance (e.g., feeling constantly on edge, experiencing concentration difficulties, having trouble falling or staying asleep, exhibiting a general state of irritability).  Low self-esteem.  Behavioral passivity, difficulties initiating action Motor tension (e.g., restlessness, tiredness, shakiness, muscle tension).  Restlessness and feelings of lost identity and meaning due to retirement.   Client Treatment Preferences: continue with current therapist   Healthcare consumer's goal for treatment:  Psychologist, Ronal Jenkins Sprung, Ph.D. will support the patient's ability to achieve the goals identified. Cognitive Behavioral Therapy, Dialectical Behavioral Therapy, Motivational  Interviewing, Behavior Activation, parent skills, and other evidenced-based practices will be used to promote progress towards healthy functioning.   Healthcare consumer Georges Victorio will: Actively participate in therapy, working towards healthy functioning.    *Justification for Continuation/Discontinuation of Goal: R=Revised, O=Ongoing, A=Achieved, D=Discontinued  Goal 1) Resolve conflicted feelings, takes steps to change work situation and adapt to the new life circumstances.   5 Point Likert rating baseline date: 02/22/2021 Target Date Goal Was reviewed Status Code Progress towards goal/Likert rating  03/20/2023 03/07/2022          O 2/5 - pt has taken initial steps but at times falls  into passivity and has made little progress  03/19/2024 03/20/2023          O 4/5 - pt has taken steps to move forward with early retirement, but now is challenged w/ a denial & the p/ begins again   03/04/2024          D 5/5 - pt f/t and has taken his early retirement and has actively concluded his leave   Goal 2) Learn and implement coping skills that result in a reduction of anxiety and worry, and improved daily functioning.  5 Point Likert rating baseline date: 02/23/2015 Target Date Goal Was reviewed Status Code Progress towards goal/Likert rating  03/20/2023 03/07/2022          O 3/5 - pt has learned coping skills and uses them appropriately but inconsistently  03/19/2024 03/20/2023          O 3.5 /5 - pt is better at identifying when he is anxious & more consistently uses his skills  03/04/2025 03/04/2024          O 4/5/- pt continues to be able to identify his anxiety and more quick is able to intervene to manage his anxiety   Goal 3) Increase activities that reinforce a positive self-identity.   5 Point Likert rating baseline date: 02/23/2015 Target Date Goal Was reviewed Status Code Progress towards goal/Likert rating  03/20/2023 03/07/2022          O 2/5 - pt has taken initial steps to introduce new  activities that have improved his feelings of positive self esteem  03/19/2024 03/20/2023          O 2.5/5 - pt has not taken any new steps but has recognized the need to do more & is making plans  03/04/2025 03/04/2024          O 3/5 - pt has improved his planning and is participating in more activities   Goal 4) Learn and implement coping skills that result in a prevention of of relapse of depression and improve  daily functioning.   5  Point Likert rating baseline date: 02/23/2015 Target Date Goal Was reviewed Status Code Progress towards goal/Likert rating   03/20/2023  03/07/2022            O  4/5 - pt has learned coping skills and uses them appropriately but inconsistently   03/19/2024  03/20/2023            O  3/5 - pt has regressed & has slipped back into old patterns which has effected his mood & fnxg   03/04/2025  03/04/2024            O  4/5 - pt has taken steps to more actively participate in family life and made changes in his work life that have made him less stressed   Goal 5) Resolve conflicted feelings, takes steps to change work situation and adapt to the new life circumstances.   5 Point Likert rating baseline date: 03/04/2024 Target Date Goal Was reviewed Status Code Progress towards goal/Likert rating  03/04/2025 03/04/2024          N                                  This plan has been reviewed and created by the following participants:  This plan will be reviewed at least every 12 months. Date Behavioral Health Clinician Date  Guardian/Patient   03/07/2022  Ronal Jenkins Sprung, Ph.D.   03/07/2022 Timothy [name]  03/20/2023  Ronal Jenkins Sprung, Ph.D.  03/20/2023 Timothy [name]  03/04/2024 Ronal Jenkins Sprung, Ph.D.  03/04/2024 Timothy [name]          Diagnoses:  Generalized Anxiety Disorder Phase of Life Change - Adult   Sajid reports that work has been more stressful.  We d/e/p how things ramped up unexpectedly, managing his anxious thoughts, and learning to be flexible.  We also  d/e/p a pattern that continues to occur in his communication when he is questioned.  I noted that his fight or flight response is triggered, nade connections between the past and the present, the need to retrain his reactivity, and communicating what is occurring to his wife.   Home Practice:  create a new work routine  Ronal Jenkins Sprung, PhD

## 2024-07-08 ENCOUNTER — Ambulatory Visit: Admitting: Psychology

## 2024-07-08 DIAGNOSIS — F411 Generalized anxiety disorder: Secondary | ICD-10-CM

## 2024-07-08 NOTE — Progress Notes (Signed)
 PROGRESS NOTE:  Name: Timothy [name] Date: 07/08/2024 MRN: [id number] DOB: [birth year] PCP: Timothy Richerd SAUNDERS, MD  Time spent: 8:00 AM - 8:58 AM  Annual Review: 03/04/2025   Today I met with  Timothy [name] in remote video (Careg[facility]) face-to-face individual psychotherapy.  Distance Site: Client's Home Orginating Site: Dr Timothy [name] Remote Office Consent: Obtained verbal consent to transmit session remotely.   Patient is aware of the limitations related to conducting virtual therapy.    Presenting Problem: Timothy [name] is a 48 y.o. WMM who started therapy several years ago seeking treatment for depression and anxiety.  Since the start of treatment, depression has resolved with periodic mild reoccurrences.  Anxiety however, has proven to be chronic and effects all areas of his life.  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  Unfortunately, he application for early retirement was denied and has created a new series of questions about his future work path.  Therefore, we have continued the process of discerning next steps in his career and professional development.  Timothy [name] mother is aging and he is beginning to see the signs of the need for additional support and negotiating way to provide the needed support and guidance.  Background History: Timothy [name] is married to Timothy [name] (53) and they have been married for 18 years.   Recently, Timothy [name] decided to go back to college to complete an undergraduate degree in education.  She already has her JD, but does not wish to return to practicing law.  While she has been happy with her decision to get a degree in education, it was a stressful time for the family, and Timothy [name] had learned to step up and help out more around the hourse.  Together they have one daughter, Timothy [name] (42) who is a rising sophomore at [name], they are a talented musician and play the saxophone, as well as  other instruments in their high school band and jazz ensemble.  A few years ago, his father-in-law purchased a house that they could all live in together.  Timothy [name] is still adjusting to having his father-in-law live under the  same roof.  Timothy [name] only has one sibling, an adopted [name], with whom he has a strained relationship.  As their mother is aging, their have been some small movements toward improving the relationship.  Both his mother and brother live in [place] .  Mental Status Exam: Appearance:   Casual and Fairly Groomed     Behavior:  Appropriate  Motor:  Normal  Speech/Language:   NA  Affect:  Appropriate  Mood:  anxious and irritable  Thought process:  normal  Thought content:    WNL  Sensory/Perceptual disturbances:    WNL  Orientation:  oriented to person, place, time/date, and situation  Attention:  Good  Concentration:  Good  Memory:  WNL  Fund of knowledge:   Good  Insight:    Good  Judgment:   Good  Impulse Control:  Good   Risk Assessment: Danger to Self:  No Self-injurious Behavior: No Danger to Others: No Duty to Warn:no Physical Aggression / Violence:No  Access to Firearms a concern: No   Substance Abuse History: Current substance abuse: No     Past Psychiatric History:   Previous psychological history is significant for anxiety and depression Outpatient Providers: current therapist History of Psych Hospitalization: No  Psychological Testing: n/a   Abuse History:  Victim of:  No., n/a   Report needed: No. Victim of Neglect:No. 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  If a parent, number of children / ages: Timothy [name] (78) they/them  Support Systems:  Spouse Friends Mother and Licensed Conveyancer Stress:  Yes   Income/Employment/Disability: Employment  Financial Planner: No   Educational History: Education: Risk Manager: N/a  Any cultural differences that may affect / interfere with treatment:  not applicable   Recreation/Hobbies: D&D, movies  Stressors: Family Conflict, Aging parent  Barriers:  None   Legal History: Pending legal issue / charges: The patient has no significant history of legal issues. History of legal issue / charges: n/a  Medical History/Surgical History: reviewed Past Medical History:  Diagnosis Date   AKI (acute kidney injury) 12/22/2015   Anxiety    Diabetes mellitus (HCC)    Essential hypertension    started on lisinopril about 45 days prior to pancreatitis   Obesity    Pancreatitis, acute 12/22/2015   a. 11/2015 - acute severe pancreatitis with shock/sepsis, complicated by acute respiratory failure, transaminitis, anemia of critical illness, acute encephalopathy, AKI (Cr 1.36), protein-calorie malnutrition with severe hypoalbuminemia, & hypokalemia.   Splenic vein thrombosis     Past Surgical History:  Procedure Laterality Date   BACK SURGERY     ERCP N/A 01/31/2016   Procedure: ENDOSCOPIC RETROGRADE CHOLANGIOPANCREATOGRAPHY (ERCP);  Surgeon: Belvie Just, MD;  Location: [facility] ENDOSCOPY;  Service: Endoscopy;  Laterality: N/A;    Medications: Current Outpatient Medications  Medication Sig Dispense Refill   acetaminophen  (TYLENOL ) 325 MG tablet Take 650 mg by mouth every 6 (six) hours as needed for fever.     albuterol  (PROVENTIL  HFA;VENTOLIN  HFA) 108 (90 Base) MCG/ACT inhaler Inhale 1-2  puffs into the lungs every 6 (six) hours as needed for wheezing or shortness of breath. 1 Inhaler 0   blood glucose meter kit and supplies Dispense based on patient and insurance preference. Use up to four times daily as directed. (FOR ICD-9 250.00, 250.01). 1 each 0   carvedilol  (COREG ) 25 MG tablet Take 1 tablet (25 mg total) by mouth 2 (two) times daily. 180 tablet 3   cetirizine (ZYRTEC) 10 MG tablet Take 10 mg by mouth at bedtime.     fluticasone (FLONASE) 50 MCG/ACT nasal spray Place 1 spray into both nostrils at bedtime.     guaiFENesin -codeine  100-10 MG/5ML syrup Take 5 mLs by mouth at bedtime as needed for cough. 120 mL 0   HYDROmorphone  (DILAUDID ) 1 MG/ML injection Inject 1 mL (1 mg total) into the vein every 4 (four) hours as needed for severe pain. 1 mL 0   imipenem -cilastatin  500 mg in sodium chloride  0.9 % 100 mL Inject 500 mg into the vein every 6 (six) hours.     insulin  aspart (NOVOLOG ) 100 UNIT/ML FlexPen 8 units with meals (as long as eating 50%) PLUS the  following scale: CBG 70 - 120: 0 units CBG 121 - 150: 2 units CBG 151 - 200: 3 units CBG 201 - 250: 5 units CBG 251 - 300: 8 units CBG 301 - 350: 11 units CBG 351 - 400: 15 units (Patient taking differently: Inject 8 Units into the skin 3 (three) times daily with meals. 8 units with meals (as long as eating 50%) PLUS the following scale: CBG 70 - 120: 0 units CBG 121 - 150: 2 units CBG 151 - 200: 3 units CBG 201 - 250: 5 units CBG 251 - 300: 8 units CBG 301 - 350: 11 units CBG 351 - 400: 15 units) 60 mL 11   Insulin  Glargine (LANTUS  SOLOSTAR) 100 UNIT/ML Solostar Pen Inject 25 Units into the skin daily at 10 pm. 15 mL 11   Insulin  Pen Needle 31G X 5 MM MISC For insulin  injections 100 each 1   pantoprazole  (PROTONIX ) 40 MG tablet Take 1 tablet (40 mg total) by mouth at bedtime. 30 tablet 0   No current facility-administered medications for this visit.    No Known Allergies    Individualized Treatment  Plan                Strengths: verbal, bright, friendly  Supports: wife, daughter, parents   Goal/Needs for Treatment:  In order of importance to patient 1)  Resolve conflicted feelings, takes steps to change work situation and adapt to the new life circumstances.   2) Learn and implement coping skills that result in a reduction of anxiety and worry, and improved daily functioning.  3) Increase activities that reinforce a positive self-identity.  4) Learn and implement coping skills that result in a prevention of of relapse of depression and improve daily functioning.    Client Statement of Needs: work on pervasive issues with anxiety, assistance in making a big life change as he contemplates retiring from current position and figures out new direction   Treatment Level: Individual Biweekly Outpatient Psychotherapy  Symptoms: Autonomic hyperactivity (e.g., palpitations, shortness of breath, dry mouth, trouble swallowing, nausea, diarrhea).  Periodic periods of depressed or irritable mood.  Excessive and/or unrealistic worry that is difficult to control occurring more days than not for at least 6 months about a number of events or activities.  Feelings of hopelessness, worthlessness, or inappropriate guilt.  Hypervigilance (e.g., feeling constantly on edge, experiencing concentration difficulties, having trouble falling or staying asleep, exhibiting a general state of irritability).  Low self-esteem.  Behavioral passivity, difficulties initiating action Motor tension (e.g., restlessness, tiredness, shakiness, muscle tension).  Restlessness and feelings of lost identity and meaning due to retirement.   Client Treatment Preferences: continue with current therapist   Healthcare consumer's goal for treatment:  Psychologist, Timothy [name], Ph.D. will support the patient's ability to achieve the goals identified. Cognitive Behavioral Therapy, Dialectical Behavioral Therapy, Motivational  Interviewing, Behavior Activation, parent skills, and other evidenced-based practices will be used to promote progress towards healthy functioning.   Healthcare consumer Timothy [name] will: Actively participate in therapy, working towards healthy functioning.    *Justification for Continuation/Discontinuation of Goal: R=Revised, O=Ongoing, A=Achieved, D=Discontinued  Goal 1) Resolve conflicted feelings, takes steps to change work situation and adapt to the new life circumstances.   5 Point Likert rating baseline date: 02/22/2021 Target Date Goal Was reviewed Status Code Progress towards goal/Likert rating  03/20/2023 03/07/2022          O 2/5 - pt has taken initial steps but at times falls  into passivity and has made little progress  03/19/2024 03/20/2023          O 4/5 - pt has taken steps to move forward with early retirement, but now is challenged w/ a denial & the p/ begins again   03/04/2024          D 5/5 - pt f/t and has taken his early retirement and has actively concluded his leave   Goal 2) Learn and implement coping skills that result in a reduction of anxiety and worry, and improved daily functioning.  5 Point Likert rating baseline date: 02/23/2015 Target Date Goal Was reviewed Status Code Progress towards goal/Likert rating  03/20/2023 03/07/2022          O 3/5 - pt has learned coping skills and uses them appropriately but inconsistently  03/19/2024 03/20/2023          O 3.5 /5 - pt is better at identifying when he is anxious & more consistently uses his skills  03/04/2025 03/04/2024          O 4/5/- pt continues to be able to identify his anxiety and more quick is able to intervene to manage his anxiety   Goal 3) Increase activities that reinforce a positive self-identity.   5 Point Likert rating baseline date: 02/23/2015 Target Date Goal Was reviewed Status Code Progress towards goal/Likert rating  03/20/2023 03/07/2022          O 2/5 - pt has taken initial steps to introduce new  activities that have improved his feelings of positive self esteem  03/19/2024 03/20/2023          O 2.5/5 - pt has not taken any new steps but has recognized the need to do more & is making plans  03/04/2025 03/04/2024          O 3/5 - pt has improved his planning and is participating in more activities   Goal 4) Learn and implement coping skills that result in a prevention of of relapse of depression and improve  daily functioning.   5  Point Likert rating baseline date: 02/23/2015 Target Date Goal Was reviewed Status Code Progress towards goal/Likert rating   03/20/2023  03/07/2022            O  4/5 - pt has learned coping skills and uses them appropriately but inconsistently   03/19/2024  03/20/2023            O  3/5 - pt has regressed & has slipped back into old patterns which has effected his mood & fnxg   03/04/2025  03/04/2024            O  4/5 - pt has taken steps to more actively participate in family life and made changes in his work life that have made him less stressed   Goal 5) Resolve conflicted feelings, takes steps to change work situation and adapt to the new life circumstances.   5 Point Likert rating baseline date: 03/04/2024 Target Date Goal Was reviewed Status Code Progress towards goal/Likert rating  03/04/2025 03/04/2024          N                                  This plan has been reviewed and created by the following participants:  This plan will be reviewed at least every 12 months. Date Behavioral Health Clinician Date  Guardian/Patient   03/07/2022  Timothy [name], Ph.D.   03/07/2022 Timothy [name]  03/20/2023  Timothy [name], Ph.D.  03/20/2023 Timothy [name]  03/04/2024 Timothy [name], Ph.D.  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  He also shared that his wife had a challenging weekend  and he felt like she took it out on him and his daughter.  We d/e/p what occurred, how he felt, how he responded, and learning to stay engaged when conflict arises.  He recognized his discomfort with the conflict, using less then positive coping strategies, and need to learn some new skills.  Lastly, we d/e/p how to provide his wife support while maintaining a safe space where he can push back when appropriate.  Burris agreed to speak to his wife about his expectations and eliciting an agreement that works for both of them.  Home Practice:  create a new work routine  Timothy Jenkins Sprung, PhD

## 2024-07-22 ENCOUNTER — Ambulatory Visit: Admitting: Psychology

## 2024-08-05 ENCOUNTER — Ambulatory Visit (INDEPENDENT_AMBULATORY_CARE_PROVIDER_SITE_OTHER): Admitting: Psychology

## 2024-08-05 DIAGNOSIS — F411 Generalized anxiety disorder: Secondary | ICD-10-CM | POA: Diagnosis not present

## 2024-08-05 DIAGNOSIS — Z600 Problems of adjustment to life-cycle transitions: Secondary | ICD-10-CM | POA: Diagnosis not present

## 2024-08-05 NOTE — Progress Notes (Signed)
 "      PROGRESS NOTE:  Name: Timothy [name] Date: 08/05/2024 MRN: [id number] DOB: 1976-07-06 PCP: Loretha Richerd SAUNDERS, MD  Time spent: 8:00 AM - 8:58 AM  Annual Review: 03/04/2025   Today I met with  Timothy [name] in remote video ([facility]) face-to-face individual psychotherapy.  Distance Site: Client's Home Orginating Site: Dr Edison Remote Office Consent: Obtained verbal consent to transmit session remotely.   Patient is aware of the limitations related to conducting virtual therapy.    Presenting Problem: Timothy [name] is a 49 y.o. WMM who started therapy several years ago seeking treatment for depression and anxiety.  Since the start of treatment, depression has resolved with periodic mild reoccurrences.  Anxiety however, has proven to be chronic and effects all areas of his life.  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  Unfortunately, he application for early retirement was denied and has created a new series of questions about his future work path.  Therefore, we have continued the process of discerning next steps in his career and professional development.  Timothy [name]'s mother is aging and he is beginning to see the signs of the need for additional support and negotiating way to provide the needed support and guidance.  Background History: Timothy [name] is married to [name] (53) and they have been married for 18 years.   Recently, Timothy [name] decided to go back to college to complete an undergraduate degree in education.  She already has her JD, but does not wish to return to practicing law.  While she has been happy with her decision to get a degree in education, it was a stressful time for the family, and Timothy [name] had learned to step up and help out more around the hourse.  Together they have one daughter, Timothy [name] (11) who is a rising sophomore at [employer], they are a talented musician and play the saxophone, as well as  other instruments in their high school band and jazz ensemble.  A few years ago, his father-in-law purchased a house that they could all live in together.  Timothy [name] is still adjusting to having his father-in-law live under the  same roof.  Timothy [name] only has one sibling, an adopted [name], with whom he has a strained relationship.  As their mother is aging, their have been some small movements toward improving the relationship.  Both his mother and brother live in [place] .  Mental Status Exam: Appearance:   Casual and Fairly Groomed     Behavior:  Appropriate  Motor:  Normal  Speech/Language:   NA  Affect:  Appropriate  Mood:  anxious and irritable  Thought process:  normal  Thought content:    WNL  Sensory/Perceptual disturbances:    WNL  Orientation:  oriented to person, place, time/date, and situation  Attention:  Good  Concentration:  Good  Memory:  WNL  Fund of knowledge:   Good  Insight:    Good  Judgment:   Good  Impulse Control:  Good   Risk Assessment: Danger to Self:  No Self-injurious Behavior: No Danger to Others: No Duty to Warn:no Physical Aggression / Violence:No  Access to Firearms a concern: No   Substance Abuse History: Current substance abuse: No     Past Psychiatric History:   Previous psychological history is significant for anxiety and depression Outpatient Providers: current therapist History of Psych Hospitalization: No  Psychological Testing: n/a   Abuse History:  Victim  of: No., n/a   Report needed: No. Victim of Neglect:No. 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  If a parent, number of children / ages: Timothy [name] (68) they/them  Support Systems:  Spouse Friends Mother and Licensed Conveyancer Stress:  Yes   Income/Employment/Disability: Employment  Financial Planner: No   Educational History: Education: Risk Manager: N/a  Any cultural differences that may affect / interfere with treatment:  not applicable   Recreation/Hobbies: D&D, movies  Stressors: Family Conflict, Aging parent  Barriers:  None   Legal History: Pending legal issue / charges: The patient has no significant history of legal issues. History of legal issue / charges: n/a  Medical History/Surgical History: reviewed Past Medical History:  Diagnosis Date   AKI (acute kidney injury) 12/22/2015   Anxiety    Diabetes mellitus (HCC)    Essential hypertension    started on lisinopril about 45 days prior to pancreatitis   Obesity    Pancreatitis, acute 12/22/2015   a. 11/2015 - acute severe pancreatitis with shock/sepsis, complicated by acute respiratory failure, transaminitis, anemia of critical illness, acute encephalopathy, AKI (Cr 1.36), protein-calorie malnutrition with severe hypoalbuminemia, & hypokalemia.   Splenic vein thrombosis     Past Surgical History:  Procedure Laterality Date   BACK SURGERY     ERCP N/A 01/31/2016   Procedure: ENDOSCOPIC RETROGRADE CHOLANGIOPANCREATOGRAPHY (ERCP);  Surgeon: Belvie Just, MD;  Location: [facility] ENDOSCOPY;  Service: Endoscopy;  Laterality: N/A;    Medications: Current Outpatient Medications  Medication Sig Dispense Refill   acetaminophen  (TYLENOL ) 325 MG tablet Take 650 mg by mouth every 6 (six) hours as needed for fever.     albuterol  (PROVENTIL  HFA;VENTOLIN  HFA) 108 (90 Base) MCG/ACT inhaler Inhale 1-2  puffs into the lungs every 6 (six) hours as needed for wheezing or shortness of breath. 1 Inhaler 0   blood glucose meter kit and supplies Dispense based on patient and insurance preference. Use up to four times daily as directed. (FOR ICD-9 250.00, 250.01). 1 each 0   carvedilol  (COREG ) 25 MG tablet Take 1 tablet (25 mg total) by mouth 2 (two) times daily. 180 tablet 3   cetirizine (ZYRTEC) 10 MG tablet Take 10 mg by mouth at bedtime.     fluticasone (FLONASE) 50 MCG/ACT nasal spray Place 1 spray into both nostrils at bedtime.     guaiFENesin -codeine  100-10 MG/5ML syrup Take 5 mLs by mouth at bedtime as needed for cough. 120 mL 0   HYDROmorphone  (DILAUDID ) 1 MG/ML injection Inject 1 mL (1 mg total) into the vein every 4 (four) hours as needed for severe pain. 1 mL 0   imipenem -cilastatin  500 mg in sodium chloride  0.9 % 100 mL Inject 500 mg into the vein every 6 (six) hours.     insulin  aspart (NOVOLOG ) 100 UNIT/ML FlexPen 8 units with meals (as long as eating 50%) PLUS  the following scale: CBG 70 - 120: 0 units CBG 121 - 150: 2 units CBG 151 - 200: 3 units CBG 201 - 250: 5 units CBG 251 - 300: 8 units CBG 301 - 350: 11 units CBG 351 - 400: 15 units (Patient taking differently: Inject 8 Units into the skin 3 (three) times daily with meals. 8 units with meals (as long as eating 50%) PLUS the following scale: CBG 70 - 120: 0 units CBG 121 - 150: 2 units CBG 151 - 200: 3 units CBG 201 - 250: 5 units CBG 251 - 300: 8 units CBG 301 - 350: 11 units CBG 351 - 400: 15 units) 60 mL 11   Insulin  Glargine (LANTUS  SOLOSTAR) 100 UNIT/ML Solostar Pen Inject 25 Units into the skin daily at 10 pm. 15 mL 11   Insulin  Pen Needle 31G X 5 MM MISC For insulin  injections 100 each 1   pantoprazole  (PROTONIX ) 40 MG tablet Take 1 tablet (40 mg total) by mouth at bedtime. 30 tablet 0   No current facility-administered medications for this visit.    No Known Allergies    Individualized Treatment  Plan                Strengths: verbal, bright, friendly  Supports: wife, daughter, parents   Goal/Needs for Treatment:  In order of importance to patient 1)  Resolve conflicted feelings, takes steps to change work situation and adapt to the new life circumstances.   2) Learn and implement coping skills that result in a reduction of anxiety and worry, and improved daily functioning.  3) Increase activities that reinforce a positive self-identity.  4) Learn and implement coping skills that result in a prevention of of relapse of depression and improve daily functioning.    Client Statement of Needs: work on pervasive issues with anxiety, assistance in making a big life change as he contemplates retiring from current position and figures out new direction   Treatment Level: Individual Biweekly Outpatient Psychotherapy  Symptoms: Autonomic hyperactivity (e.g., palpitations, shortness of breath, dry mouth, trouble swallowing, nausea, diarrhea).  Periodic periods of depressed or irritable mood.  Excessive and/or unrealistic worry that is difficult to control occurring more days than not for at least 6 months about a number of events or activities.  Feelings of hopelessness, worthlessness, or inappropriate guilt.  Hypervigilance (e.g., feeling constantly on edge, experiencing concentration difficulties, having trouble falling or staying asleep, exhibiting a general state of irritability).  Low self-esteem.  Behavioral passivity, difficulties initiating action Motor tension (e.g., restlessness, tiredness, shakiness, muscle tension).  Restlessness and feelings of lost identity and meaning due to retirement.   Client Treatment Preferences: continue with current therapist   Healthcare consumer's goal for treatment:  Psychologist, Ronal Jenkins Sprung, Ph.D. will support the patient's ability to achieve the goals identified. Cognitive Behavioral Therapy, Dialectical Behavioral Therapy, Motivational  Interviewing, Behavior Activation, parent skills, and other evidenced-based practices will be used to promote progress towards healthy functioning.   Healthcare consumer Timothy [name] will: Actively participate in therapy, working towards healthy functioning.    *Justification for Continuation/Discontinuation of Goal: R=Revised, O=Ongoing, A=Achieved, D=Discontinued  Goal 1) Resolve conflicted feelings, takes steps to change work situation and adapt to the new life circumstances.   5 Point Likert rating baseline date: 02/22/2021 Target Date Goal Was reviewed Status Code Progress towards goal/Likert rating  03/20/2023 03/07/2022          O 2/5 - pt has taken initial steps but at times  falls into passivity and has made little progress  03/19/2024 03/20/2023          O 4/5 - pt has taken steps to move forward with early retirement, but now is challenged w/ a denial & the p/ begins again   03/04/2024          D 5/5 - pt f/t and has taken his early retirement and has actively concluded his leave   Goal 2) Learn and implement coping skills that result in a reduction of anxiety and worry, and improved daily functioning.  5 Point Likert rating baseline date: 02/23/2015 Target Date Goal Was reviewed Status Code Progress towards goal/Likert rating  03/20/2023 03/07/2022          O 3/5 - pt has learned coping skills and uses them appropriately but inconsistently  03/19/2024 03/20/2023          O 3.5 /5 - pt is better at identifying when he is anxious & more consistently uses his skills  03/04/2025 03/04/2024          O 4/5/- pt continues to be able to identify his anxiety and more quick is able to intervene to manage his anxiety   Goal 3) Increase activities that reinforce a positive self-identity.   5 Point Likert rating baseline date: 02/23/2015 Target Date Goal Was reviewed Status Code Progress towards goal/Likert rating  03/20/2023 03/07/2022          O 2/5 - pt has taken initial steps to introduce new  activities that have improved his feelings of positive self esteem  03/19/2024 03/20/2023          O 2.5/5 - pt has not taken any new steps but has recognized the need to do more & is making plans  03/04/2025 03/04/2024          O 3/5 - pt has improved his planning and is participating in more activities   Goal 4) Learn and implement coping skills that result in a prevention of of relapse of depression and improve  daily functioning.   5  Point Likert rating baseline date: 02/23/2015 Target Date Goal Was reviewed Status Code Progress towards goal/Likert rating   03/20/2023  03/07/2022            O  4/5 - pt has learned coping skills and uses them appropriately but inconsistently   03/19/2024  03/20/2023            O  3/5 - pt has regressed & has slipped back into old patterns which has effected his mood & fnxg   03/04/2025  03/04/2024            O  4/5 - pt has taken steps to more actively participate in family life and made changes in his work life that have made him less stressed   Goal 5) Resolve conflicted feelings, takes steps to change work situation and adapt to the new life circumstances.   5 Point Likert rating baseline date: 03/04/2024 Target Date Goal Was reviewed Status Code Progress towards goal/Likert rating  03/04/2025 03/04/2024          N                                  This plan has been reviewed and created by the following participants:  This plan will be reviewed at least every 12 months. Date Behavioral Health Clinician  Date Guardian/Patient   03/07/2022  Ronal Jenkins Sprung, Ph.D.   03/07/2022 Timothy [name]  03/20/2023  Ronal Jenkins Sprung, Ph.D.  03/20/2023 Timothy [name]  03/04/2024 Ronal Jenkins Sprung, Ph.D.  03/04/2024 Timothy [name]          Diagnoses:  Generalized Anxiety Disorder Phase of Life Change - Adult   Timothy [name] reports that work continues to go well.  However, he received some feedback that was both positive and confusing.  We d/e/p what occurred, how to get  clarification, and how to shift the situation into an opportunity to ask for what he wants.  We d/p a recent family heartbreak, managing his daughter's anxiety, and getting comfortable with uncertainty.  Home Practice:  create a new work routine  Ronal Jenkins Sprung, PhD   "

## 2024-08-19 ENCOUNTER — Ambulatory Visit: Admitting: Psychology

## 2024-08-19 DIAGNOSIS — F411 Generalized anxiety disorder: Secondary | ICD-10-CM

## 2024-08-19 DIAGNOSIS — Z600 Problems of adjustment to life-cycle transitions: Secondary | ICD-10-CM

## 2024-08-19 NOTE — Progress Notes (Signed)
 "      PROGRESS NOTE:  Name: Timothy [name] Date: 08/19/2024 MRN: [id number] DOB: 25-Apr-1976 PCP: Timothy Richerd SAUNDERS, MD  Time spent: 8:00 AM - 8:58 AM  Annual Review: 03/04/2025   Today I met with  Timothy [name] in remote video ([facility]) face-to-face individual psychotherapy.  Distance Site: Client's Home Orginating Site: Dr Edison Remote Office Consent: Obtained verbal consent to transmit session remotely.   Patient is aware of the limitations related to conducting virtual therapy.    Presenting Problem: Timothy [name] is a 49 y.o. WMM who started therapy several years ago seeking treatment for depression and anxiety.  Since the start of treatment, depression has resolved with periodic mild reoccurrences.  Anxiety however, has proven to be chronic and effects all areas of his life.  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  Unfortunately, he application for early retirement was denied and has created a new series of questions about his future work path.  Therefore, we have continued the process of discerning next steps in his career and professional development.  Timothy [name]'s mother is aging and he is beginning to see the signs of the need for additional support and negotiating way to provide the needed support and guidance.  Background History: Timothy [name] is married to Timothy [name] (53) and they have been married for 18 years.   Recently, Timothy [name] decided to go back to college to complete an undergraduate degree in education.  She already has her JD, but does not wish to return to practicing law.  While she has been happy with her decision to get a degree in education, it was a stressful time for the family, and Timothy [name] had learned to step up and help out more around the hourse.  Together they have one daughter, Timothy [name] (8) who is a rising sophomore at [name], they are a talented musician and play the saxophone, as well as  other instruments in their high school band and jazz ensemble.  A few years ago, his father-in-law purchased a house that they could all live in together.  Timothy [name] is still adjusting to having his father-in-law live under the  same roof.  Timothy [name] only has one sibling, an adopted [name], with whom he has a strained relationship.  As their mother is aging, their have been some small movements toward improving the relationship.  Both his mother and brother live in [name] .  Mental Status Exam: Appearance:   Casual and Fairly Groomed     Behavior:  Appropriate  Motor:  Normal  Speech/Language:   NA  Affect:  Appropriate  Mood:  anxious and irritable  Thought process:  normal  Thought content:    WNL  Sensory/Perceptual disturbances:    WNL  Orientation:  oriented to person, place, time/date, and situation  Attention:  Good  Concentration:  Good  Memory:  WNL  Fund of knowledge:   Good  Insight:    Good  Judgment:   Good  Impulse Control:  Good   Risk Assessment: Danger to Self:  No Self-injurious Behavior: No Danger to Others: No Duty to Warn:no Physical Aggression / [name]:No  Access to Firearms a concern: No   Substance Abuse History: Current substance abuse: No     Past Psychiatric History:   Previous psychological history is significant for anxiety and depression Outpatient Providers: current therapist History of Psych Hospitalization: No  Psychological Testing: n/a   Abuse History:  Victim  of: No., n/a   Report needed: No. Victim of Neglect:No. 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  If a parent, number of children / ages: Timothy [name] (37) they/them  Support Systems:  Spouse Friends Mother and Licensed Conveyancer Stress:  Yes   Income/Employment/Disability: Employment  Financial Planner: No   Educational History: Education: Risk Manager: N/a  Any cultural differences that may affect / interfere with treatment:  not applicable   Recreation/Hobbies: D&D, movies  Stressors: Family Conflict, Aging parent  Barriers:  None   Legal History: Pending legal issue / charges: The patient has no significant history of legal issues. History of legal issue / charges: n/a  Medical History/Surgical History: reviewed Past Medical History:  Diagnosis Date   AKI (acute kidney injury) 12/22/2015   Anxiety    Diabetes mellitus (HCC)    Essential hypertension    started on lisinopril about 45 days prior to pancreatitis   Obesity    Pancreatitis, acute 12/22/2015   a. 11/2015 - acute severe pancreatitis with shock/sepsis, complicated by acute respiratory failure, transaminitis, anemia of critical illness, acute encephalopathy, AKI (Cr 1.36), protein-calorie malnutrition with severe hypoalbuminemia, & hypokalemia.   Splenic vein thrombosis     Past Surgical History:  Procedure Laterality Date   BACK SURGERY     ERCP N/A 01/31/2016   Procedure: ENDOSCOPIC RETROGRADE CHOLANGIOPANCREATOGRAPHY (ERCP);  Surgeon: Belvie Just, MD;  Location: [facility] ENDOSCOPY;  Service: Endoscopy;  Laterality: N/A;    Medications: Current Outpatient Medications  Medication Sig Dispense Refill   acetaminophen  (TYLENOL ) 325 MG tablet Take 650 mg by mouth every 6 (six) hours as needed for fever.     albuterol  (PROVENTIL  HFA;VENTOLIN  HFA) 108 (90 Base) MCG/ACT inhaler Inhale 1-2  puffs into the lungs every 6 (six) hours as needed for wheezing or shortness of breath. 1 Inhaler 0   blood glucose meter kit and supplies Dispense based on patient and insurance preference. Use up to four times daily as directed. (FOR ICD-9 250.00, 250.01). 1 each 0   carvedilol  (COREG ) 25 MG tablet Take 1 tablet (25 mg total) by mouth 2 (two) times daily. 180 tablet 3   cetirizine (ZYRTEC) 10 MG tablet Take 10 mg by mouth at bedtime.     fluticasone (FLONASE) 50 MCG/ACT nasal spray Place 1 spray into both nostrils at bedtime.     guaiFENesin -codeine  100-10 MG/5ML syrup Take 5 mLs by mouth at bedtime as needed for cough. 120 mL 0   HYDROmorphone  (DILAUDID ) 1 MG/ML injection Inject 1 mL (1 mg total) into the vein every 4 (four) hours as needed for severe pain. 1 mL 0   imipenem -cilastatin  500 mg in sodium chloride  0.9 % 100 mL Inject 500 mg into the vein every 6 (six) hours.     insulin  aspart (NOVOLOG ) 100 UNIT/ML FlexPen 8 units with meals (as long as eating 50%) PLUS  the following scale: CBG 70 - 120: 0 units CBG 121 - 150: 2 units CBG 151 - 200: 3 units CBG 201 - 250: 5 units CBG 251 - 300: 8 units CBG 301 - 350: 11 units CBG 351 - 400: 15 units (Patient taking differently: Inject 8 Units into the skin 3 (three) times daily with meals. 8 units with meals (as long as eating 50%) PLUS the following scale: CBG 70 - 120: 0 units CBG 121 - 150: 2 units CBG 151 - 200: 3 units CBG 201 - 250: 5 units CBG 251 - 300: 8 units CBG 301 - 350: 11 units CBG 351 - 400: 15 units) 60 mL 11   Insulin  Glargine (LANTUS  SOLOSTAR) 100 UNIT/ML Solostar Pen Inject 25 Units into the skin daily at 10 pm. 15 mL 11   Insulin  Pen Needle 31G X 5 MM MISC For insulin  injections 100 each 1   pantoprazole  (PROTONIX ) 40 MG tablet Take 1 tablet (40 mg total) by mouth at bedtime. 30 tablet 0   No current facility-administered medications for this visit.    No Known Allergies    Individualized Treatment  Plan                Strengths: verbal, bright, friendly  Supports: wife, daughter, parents   Goal/Needs for Treatment:  In order of importance to patient 1)  Resolve conflicted feelings, takes steps to change work situation and adapt to the new life circumstances.   2) Learn and implement coping skills that result in a reduction of anxiety and worry, and improved daily functioning.  3) Increase activities that reinforce a positive self-identity.  4) Learn and implement coping skills that result in a prevention of of relapse of depression and improve daily functioning.    Client Statement of Needs: work on pervasive issues with anxiety, assistance in making a big life change as he contemplates retiring from current position and figures out new direction   Treatment Level: Individual Biweekly Outpatient Psychotherapy  Symptoms: Autonomic hyperactivity (e.g., palpitations, shortness of breath, dry mouth, trouble swallowing, nausea, diarrhea).  Periodic periods of depressed or irritable mood.  Excessive and/or unrealistic worry that is difficult to control occurring more days than not for at least 6 months about a number of events or activities.  Feelings of hopelessness, worthlessness, or inappropriate guilt.  Hypervigilance (e.g., feeling constantly on edge, experiencing concentration difficulties, having trouble falling or staying asleep, exhibiting a general state of irritability).  Low self-esteem.  Behavioral passivity, difficulties initiating action Motor tension (e.g., restlessness, tiredness, shakiness, muscle tension).  Restlessness and feelings of lost identity and meaning due to retirement.   Client Treatment Preferences: continue with current therapist   Healthcare consumer's goal for treatment:  Psychologist, [name], Ph.D. will support the patient's ability to achieve the goals identified. Cognitive Behavioral Therapy, Dialectical Behavioral Therapy, Motivational  Interviewing, Behavior Activation, parent skills, and other evidenced-based practices will be used to promote progress towards healthy functioning.   Healthcare consumer Timothy [name] will: Actively participate in therapy, working towards healthy functioning.    *Justification for Continuation/Discontinuation of Goal: R=Revised, O=Ongoing, A=Achieved, D=Discontinued  Goal 1) Resolve conflicted feelings, takes steps to change work situation and adapt to the new life circumstances.   5 Point Likert rating baseline date: 02/22/2021 Target Date Goal Was reviewed Status Code Progress towards goal/Likert rating  03/20/2023 03/07/2022          O 2/5 - pt has taken initial steps but at times  falls into passivity and has made little progress  03/19/2024 03/20/2023          O 4/5 - pt has taken steps to move forward with early retirement, but now is challenged w/ a denial & the p/ begins again   03/04/2024          D 5/5 - pt f/t and has taken his early retirement and has actively concluded his leave   Goal 2) Learn and implement coping skills that result in a reduction of anxiety and worry, and improved daily functioning.  5 Point Likert rating baseline date: 02/23/2015 Target Date Goal Was reviewed Status Code Progress towards goal/Likert rating  03/20/2023 03/07/2022          O 3/5 - pt has learned coping skills and uses them appropriately but inconsistently  03/19/2024 03/20/2023          O 3.5 /5 - pt is better at identifying when he is anxious & more consistently uses his skills  03/04/2025 03/04/2024          O 4/5/- pt continues to be able to identify his anxiety and more quick is able to intervene to manage his anxiety   Goal 3) Increase activities that reinforce a positive self-identity.   5 Point Likert rating baseline date: 02/23/2015 Target Date Goal Was reviewed Status Code Progress towards goal/Likert rating  03/20/2023 03/07/2022          O 2/5 - pt has taken initial steps to introduce new  activities that have improved his feelings of positive self esteem  03/19/2024 03/20/2023          O 2.5/5 - pt has not taken any new steps but has recognized the need to do more & is making plans  03/04/2025 03/04/2024          O 3/5 - pt has improved his planning and is participating in more activities   Goal 4) Learn and implement coping skills that result in a prevention of of relapse of depression and improve  daily functioning.   5  Point Likert rating baseline date: 02/23/2015 Target Date Goal Was reviewed Status Code Progress towards goal/Likert rating   03/20/2023  03/07/2022            O  4/5 - pt has learned coping skills and uses them appropriately but inconsistently   03/19/2024  03/20/2023            O  3/5 - pt has regressed & has slipped back into old patterns which has effected his mood & fnxg   03/04/2025  03/04/2024            O  4/5 - pt has taken steps to more actively participate in family life and made changes in his work life that have made him less stressed   Goal 5) Resolve conflicted feelings, takes steps to change work situation and adapt to the new life circumstances.   5 Point Likert rating baseline date: 03/04/2024 Target Date Goal Was reviewed Status Code Progress towards goal/Likert rating  03/04/2025 03/04/2024          N                                  This plan has been reviewed and created by the following participants:  This plan will be reviewed at least every 12 months. Date Behavioral Health Clinician  Date Guardian/Patient   03/07/2022  Tim[place] [name], Ph.D.   03/07/2022 Timothy [name]  03/20/2023  Tim[name], Ph.D.  03/20/2023 Timothy [name]  03/04/2024 Timothy [name], Ph.D.  03/04/2024 Timothy [name]          Diagnoses:  Generalized Anxiety Disorder Phase of Life Change - Adult   Schuyler reports that work has been very busy and it continues to go well.  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  We d/p his teenaged daughter's first car accident, what occurred, how he felt and responded, and made connections between his present and past.  I noted that each new developmental stage his daughter enters will trigger any of his own unresolved issues from the past but that much can be learned and healed through these experiences.   Home Practice:  create a new work routine  Timothy Jenkins Sprung, PhD   "

## 2024-09-02 ENCOUNTER — Ambulatory Visit: Admitting: Psychology

## 2024-09-02 DIAGNOSIS — Z600 Problems of adjustment to life-cycle transitions: Secondary | ICD-10-CM | POA: Diagnosis not present

## 2024-09-02 DIAGNOSIS — F411 Generalized anxiety disorder: Secondary | ICD-10-CM | POA: Diagnosis not present

## 2024-09-16 ENCOUNTER — Ambulatory Visit: Admitting: Psychology

## 2024-09-30 ENCOUNTER — Ambulatory Visit: Admitting: Psychology

## 2024-10-14 ENCOUNTER — Ambulatory Visit: Admitting: Psychology

## 2024-10-28 ENCOUNTER — Ambulatory Visit: Admitting: Psychology

## 2024-11-11 ENCOUNTER — Ambulatory Visit: Admitting: Psychology

## 2024-11-25 ENCOUNTER — Ambulatory Visit: Admitting: Psychology

## 2024-12-09 ENCOUNTER — Ambulatory Visit: Admitting: Psychology

## 2025-01-06 ENCOUNTER — Ambulatory Visit: Admitting: Psychology

## 2025-01-20 ENCOUNTER — Ambulatory Visit: Admitting: Psychology

## 2025-02-03 ENCOUNTER — Ambulatory Visit: Admitting: Psychology

## 2025-02-17 ENCOUNTER — Ambulatory Visit: Admitting: Psychology
